# Patient Record
Sex: Male | Born: 1975 | Race: White | Hispanic: No | Marital: Married | Smoking: Former smoker
Health system: Southern US, Community
[De-identification: ages and names within clinical notes are randomized; demographics above are authoritative.]

## PROBLEM LIST (undated history)

## (undated) DIAGNOSIS — K760 Fatty (change of) liver, not elsewhere classified: Secondary | ICD-10-CM

## (undated) DIAGNOSIS — Z941 Heart transplant status: Secondary | ICD-10-CM

## (undated) DIAGNOSIS — Z8619 Personal history of other infectious and parasitic diseases: Secondary | ICD-10-CM

## (undated) DIAGNOSIS — I472 Ventricular tachycardia, unspecified: Secondary | ICD-10-CM

## (undated) DIAGNOSIS — J45909 Unspecified asthma, uncomplicated: Secondary | ICD-10-CM

## (undated) DIAGNOSIS — M8580 Other specified disorders of bone density and structure, unspecified site: Secondary | ICD-10-CM

## (undated) DIAGNOSIS — Z87442 Personal history of urinary calculi: Secondary | ICD-10-CM

## (undated) DIAGNOSIS — G4730 Sleep apnea, unspecified: Secondary | ICD-10-CM

## (undated) DIAGNOSIS — K859 Acute pancreatitis without necrosis or infection, unspecified: Secondary | ICD-10-CM

## (undated) DIAGNOSIS — Z95810 Presence of automatic (implantable) cardiac defibrillator: Secondary | ICD-10-CM

## (undated) DIAGNOSIS — F419 Anxiety disorder, unspecified: Secondary | ICD-10-CM

## (undated) HISTORY — DX: Ventricular tachycardia, unspecified: I47.20

## (undated) HISTORY — PX: HAND SURGERY: SHX662

## (undated) HISTORY — PX: TONSILLECTOMY: SUR1361

## (undated) HISTORY — DX: Presence of automatic (implantable) cardiac defibrillator: Z95.810

## (undated) HISTORY — PX: OTHER SURGICAL HISTORY: SHX169

## (undated) HISTORY — PX: WISDOM TOOTH EXTRACTION: SHX21

## (undated) HISTORY — DX: Anxiety disorder, unspecified: F41.9

## (undated) HISTORY — DX: Personal history of other infectious and parasitic diseases: Z86.19

## (undated) HISTORY — DX: Ventricular tachycardia: I47.2

---

## 1998-12-16 ENCOUNTER — Encounter: Payer: Self-pay | Admitting: Emergency Medicine

## 1998-12-16 ENCOUNTER — Emergency Department (HOSPITAL_COMMUNITY): Admission: EM | Admit: 1998-12-16 | Discharge: 1998-12-16 | Payer: Self-pay | Admitting: Emergency Medicine

## 1998-12-17 ENCOUNTER — Emergency Department (HOSPITAL_COMMUNITY): Admission: EM | Admit: 1998-12-17 | Discharge: 1998-12-17 | Payer: Self-pay | Admitting: Emergency Medicine

## 1999-05-16 ENCOUNTER — Other Ambulatory Visit: Admission: RE | Admit: 1999-05-16 | Discharge: 1999-05-16 | Payer: Self-pay | Admitting: Otolaryngology

## 1999-12-31 ENCOUNTER — Emergency Department (HOSPITAL_COMMUNITY): Admission: EM | Admit: 1999-12-31 | Discharge: 1999-12-31 | Payer: Self-pay | Admitting: Emergency Medicine

## 1999-12-31 ENCOUNTER — Encounter: Payer: Self-pay | Admitting: Emergency Medicine

## 1999-12-31 ENCOUNTER — Encounter (INDEPENDENT_AMBULATORY_CARE_PROVIDER_SITE_OTHER): Payer: Self-pay | Admitting: Specialist

## 1999-12-31 ENCOUNTER — Ambulatory Visit (HOSPITAL_BASED_OUTPATIENT_CLINIC_OR_DEPARTMENT_OTHER): Admission: RE | Admit: 1999-12-31 | Discharge: 1999-12-31 | Payer: Self-pay | Admitting: Orthopedic Surgery

## 2000-01-04 ENCOUNTER — Emergency Department (HOSPITAL_COMMUNITY): Admission: EM | Admit: 2000-01-04 | Discharge: 2000-01-04 | Payer: Self-pay | Admitting: Emergency Medicine

## 2001-09-20 ENCOUNTER — Encounter: Payer: Self-pay | Admitting: Gastroenterology

## 2001-09-22 ENCOUNTER — Ambulatory Visit (HOSPITAL_COMMUNITY): Admission: RE | Admit: 2001-09-22 | Discharge: 2001-09-22 | Payer: Self-pay | Admitting: Internal Medicine

## 2001-09-22 ENCOUNTER — Encounter: Payer: Self-pay | Admitting: Internal Medicine

## 2001-09-22 ENCOUNTER — Encounter: Payer: Self-pay | Admitting: Gastroenterology

## 2001-09-23 ENCOUNTER — Encounter: Payer: Self-pay | Admitting: Gastroenterology

## 2001-09-27 ENCOUNTER — Ambulatory Visit (HOSPITAL_COMMUNITY): Admission: RE | Admit: 2001-09-27 | Discharge: 2001-09-27 | Payer: Self-pay | Admitting: Internal Medicine

## 2001-09-27 ENCOUNTER — Encounter: Payer: Self-pay | Admitting: Internal Medicine

## 2005-08-18 DIAGNOSIS — Z95810 Presence of automatic (implantable) cardiac defibrillator: Secondary | ICD-10-CM

## 2005-08-18 HISTORY — DX: Presence of automatic (implantable) cardiac defibrillator: Z95.810

## 2006-05-04 ENCOUNTER — Ambulatory Visit: Payer: Self-pay | Admitting: Family Medicine

## 2006-05-30 ENCOUNTER — Ambulatory Visit: Payer: Self-pay | Admitting: *Deleted

## 2006-05-30 ENCOUNTER — Inpatient Hospital Stay (HOSPITAL_COMMUNITY): Admission: EM | Admit: 2006-05-30 | Discharge: 2006-06-04 | Payer: Self-pay | Admitting: Emergency Medicine

## 2006-06-01 HISTORY — PX: CARDIAC CATHETERIZATION: SHX172

## 2006-06-03 HISTORY — PX: CARDIAC DEFIBRILLATOR PLACEMENT: SHX171

## 2006-06-17 ENCOUNTER — Ambulatory Visit: Payer: Self-pay

## 2006-06-26 ENCOUNTER — Ambulatory Visit: Payer: Self-pay | Admitting: Internal Medicine

## 2006-06-30 ENCOUNTER — Encounter: Payer: Self-pay | Admitting: Gastroenterology

## 2006-07-02 ENCOUNTER — Ambulatory Visit: Payer: Self-pay | Admitting: Cardiology

## 2006-07-23 ENCOUNTER — Ambulatory Visit: Payer: Self-pay | Admitting: Internal Medicine

## 2006-09-23 ENCOUNTER — Ambulatory Visit: Payer: Self-pay

## 2006-10-27 ENCOUNTER — Ambulatory Visit: Payer: Self-pay | Admitting: Internal Medicine

## 2006-12-22 ENCOUNTER — Ambulatory Visit: Payer: Self-pay | Admitting: Internal Medicine

## 2007-01-27 ENCOUNTER — Ambulatory Visit: Payer: Self-pay | Admitting: Internal Medicine

## 2007-03-03 ENCOUNTER — Ambulatory Visit: Payer: Self-pay

## 2007-03-03 ENCOUNTER — Ambulatory Visit: Payer: Self-pay | Admitting: Internal Medicine

## 2007-05-11 ENCOUNTER — Ambulatory Visit: Payer: Self-pay | Admitting: Internal Medicine

## 2007-08-10 ENCOUNTER — Ambulatory Visit: Payer: Self-pay | Admitting: Internal Medicine

## 2007-08-17 ENCOUNTER — Ambulatory Visit: Payer: Self-pay | Admitting: Family Medicine

## 2007-08-17 DIAGNOSIS — L259 Unspecified contact dermatitis, unspecified cause: Secondary | ICD-10-CM | POA: Insufficient documentation

## 2007-10-26 ENCOUNTER — Ambulatory Visit: Payer: Self-pay | Admitting: Internal Medicine

## 2008-02-04 ENCOUNTER — Ambulatory Visit: Payer: Self-pay | Admitting: Internal Medicine

## 2008-03-06 ENCOUNTER — Ambulatory Visit: Payer: Self-pay

## 2008-03-15 ENCOUNTER — Ambulatory Visit: Payer: Self-pay | Admitting: Internal Medicine

## 2008-05-15 ENCOUNTER — Inpatient Hospital Stay (HOSPITAL_COMMUNITY): Admission: EM | Admit: 2008-05-15 | Discharge: 2008-05-22 | Payer: Self-pay | Admitting: Emergency Medicine

## 2008-05-15 ENCOUNTER — Ambulatory Visit: Payer: Self-pay | Admitting: *Deleted

## 2008-05-19 ENCOUNTER — Telehealth (INDEPENDENT_AMBULATORY_CARE_PROVIDER_SITE_OTHER): Payer: Self-pay | Admitting: Internal Medicine

## 2008-05-20 ENCOUNTER — Ambulatory Visit: Payer: Self-pay | Admitting: Infectious Diseases

## 2008-05-29 ENCOUNTER — Ambulatory Visit: Payer: Self-pay | Admitting: Internal Medicine

## 2008-05-29 LAB — CONVERTED CEMR LAB
CO2: 31 meq/L (ref 19–32)
Calcium: 9.2 mg/dL (ref 8.4–10.5)
Creatinine, Ser: 0.9 mg/dL (ref 0.4–1.5)
Glucose, Bld: 103 mg/dL — ABNORMAL HIGH (ref 70–99)

## 2008-06-27 ENCOUNTER — Ambulatory Visit: Payer: Self-pay | Admitting: Internal Medicine

## 2008-06-27 LAB — CONVERTED CEMR LAB
BUN: 8 mg/dL (ref 6–23)
Calcium: 9.2 mg/dL (ref 8.4–10.5)
GFR calc Af Amer: 113 mL/min
Glucose, Bld: 90 mg/dL (ref 70–99)
Sodium: 141 meq/L (ref 135–145)

## 2008-07-05 ENCOUNTER — Ambulatory Visit: Payer: Self-pay | Admitting: Internal Medicine

## 2008-07-05 ENCOUNTER — Ambulatory Visit: Payer: Self-pay | Admitting: Psychology

## 2008-07-18 ENCOUNTER — Ambulatory Visit: Payer: Self-pay | Admitting: Psychology

## 2008-08-03 ENCOUNTER — Ambulatory Visit: Payer: Self-pay | Admitting: Internal Medicine

## 2008-08-15 ENCOUNTER — Ambulatory Visit: Payer: Self-pay | Admitting: Psychology

## 2008-09-20 ENCOUNTER — Encounter: Payer: Self-pay | Admitting: Internal Medicine

## 2008-09-27 ENCOUNTER — Ambulatory Visit: Payer: Self-pay | Admitting: Psychology

## 2008-10-10 ENCOUNTER — Ambulatory Visit: Payer: Self-pay | Admitting: Psychology

## 2008-11-03 ENCOUNTER — Encounter: Payer: Self-pay | Admitting: Internal Medicine

## 2008-11-03 ENCOUNTER — Ambulatory Visit: Payer: Self-pay | Admitting: Internal Medicine

## 2008-11-03 DIAGNOSIS — Z8679 Personal history of other diseases of the circulatory system: Secondary | ICD-10-CM | POA: Insufficient documentation

## 2008-11-03 DIAGNOSIS — R209 Unspecified disturbances of skin sensation: Secondary | ICD-10-CM | POA: Insufficient documentation

## 2008-11-03 DIAGNOSIS — I472 Ventricular tachycardia: Secondary | ICD-10-CM

## 2008-11-03 LAB — CONVERTED CEMR LAB
Albumin: 4.2 g/dL (ref 3.5–5.2)
Total Protein: 7.3 g/dL (ref 6.0–8.3)

## 2008-12-05 ENCOUNTER — Ambulatory Visit: Payer: Self-pay | Admitting: Gastroenterology

## 2008-12-05 ENCOUNTER — Telehealth (INDEPENDENT_AMBULATORY_CARE_PROVIDER_SITE_OTHER): Payer: Self-pay | Admitting: Internal Medicine

## 2008-12-05 DIAGNOSIS — R7401 Elevation of levels of liver transaminase levels: Secondary | ICD-10-CM | POA: Insufficient documentation

## 2008-12-05 DIAGNOSIS — R740 Nonspecific elevation of levels of transaminase and lactic acid dehydrogenase [LDH]: Secondary | ICD-10-CM

## 2008-12-05 DIAGNOSIS — K219 Gastro-esophageal reflux disease without esophagitis: Secondary | ICD-10-CM | POA: Insufficient documentation

## 2008-12-06 LAB — CONVERTED CEMR LAB
Bilirubin, Direct: 0.1 mg/dL (ref 0.0–0.3)
Ferritin: 85.8 ng/mL (ref 22.0–322.0)
INR: 1 (ref 0.8–1.0)
Prothrombin Time: 11.1 s (ref 10.9–13.3)
Saturation Ratios: 20.1 % (ref 20.0–50.0)
Total Bilirubin: 0.9 mg/dL (ref 0.3–1.2)

## 2008-12-07 ENCOUNTER — Ambulatory Visit (HOSPITAL_COMMUNITY): Admission: RE | Admit: 2008-12-07 | Discharge: 2008-12-07 | Payer: Self-pay | Admitting: Gastroenterology

## 2008-12-10 LAB — CONVERTED CEMR LAB
A-1 Antitrypsin, Ser: 127 mg/dL (ref 83–200)
Angiotensin 1 Converting Enzyme: 65 units/L (ref 9–67)

## 2008-12-11 ENCOUNTER — Telehealth: Payer: Self-pay | Admitting: Internal Medicine

## 2008-12-19 ENCOUNTER — Ambulatory Visit: Payer: Self-pay | Admitting: Internal Medicine

## 2008-12-19 ENCOUNTER — Telehealth: Payer: Self-pay | Admitting: Internal Medicine

## 2008-12-19 ENCOUNTER — Ambulatory Visit: Payer: Self-pay | Admitting: Psychology

## 2009-01-04 ENCOUNTER — Ambulatory Visit: Payer: Self-pay | Admitting: Gastroenterology

## 2009-01-04 DIAGNOSIS — K760 Fatty (change of) liver, not elsewhere classified: Secondary | ICD-10-CM | POA: Insufficient documentation

## 2009-01-04 DIAGNOSIS — K8020 Calculus of gallbladder without cholecystitis without obstruction: Secondary | ICD-10-CM | POA: Insufficient documentation

## 2009-01-04 LAB — CONVERTED CEMR LAB
AST: 41 units/L — ABNORMAL HIGH (ref 0–37)
Alkaline Phosphatase: 46 units/L (ref 39–117)
Total Bilirubin: 0.6 mg/dL (ref 0.3–1.2)

## 2009-01-16 ENCOUNTER — Ambulatory Visit: Payer: Self-pay | Admitting: Psychology

## 2009-01-30 ENCOUNTER — Ambulatory Visit: Payer: Self-pay | Admitting: Psychology

## 2009-02-01 ENCOUNTER — Ambulatory Visit: Payer: Self-pay | Admitting: Internal Medicine

## 2009-02-14 ENCOUNTER — Telehealth: Payer: Self-pay | Admitting: Internal Medicine

## 2009-04-27 ENCOUNTER — Ambulatory Visit: Payer: Self-pay | Admitting: Internal Medicine

## 2009-04-27 LAB — CONVERTED CEMR LAB
Albumin: 4 g/dL (ref 3.5–5.2)
Alkaline Phosphatase: 42 units/L (ref 39–117)
Cholesterol: 215 mg/dL — ABNORMAL HIGH (ref 0–200)
HDL: 33.1 mg/dL — ABNORMAL LOW (ref >39.00)
Total CHOL/HDL Ratio: 6
VLDL: 67.2 mg/dL — ABNORMAL HIGH (ref 0.0–40.0)

## 2009-04-30 ENCOUNTER — Ambulatory Visit: Payer: Self-pay

## 2009-04-30 ENCOUNTER — Encounter: Payer: Self-pay | Admitting: Internal Medicine

## 2009-05-12 ENCOUNTER — Emergency Department: Payer: Self-pay | Admitting: Unknown Physician Specialty

## 2009-05-12 ENCOUNTER — Ambulatory Visit: Payer: Self-pay | Admitting: Cardiology

## 2009-05-14 ENCOUNTER — Telehealth: Payer: Self-pay | Admitting: Internal Medicine

## 2009-05-15 ENCOUNTER — Telehealth: Payer: Self-pay | Admitting: Internal Medicine

## 2009-05-18 ENCOUNTER — Ambulatory Visit: Payer: Self-pay | Admitting: Internal Medicine

## 2009-05-18 HISTORY — PX: OTHER SURGICAL HISTORY: SHX169

## 2009-05-22 ENCOUNTER — Ambulatory Visit: Payer: Self-pay | Admitting: Internal Medicine

## 2009-06-11 ENCOUNTER — Telehealth: Payer: Self-pay | Admitting: Internal Medicine

## 2009-06-15 ENCOUNTER — Encounter: Payer: Self-pay | Admitting: Internal Medicine

## 2009-06-15 ENCOUNTER — Ambulatory Visit: Payer: Self-pay | Admitting: Psychology

## 2009-06-15 ENCOUNTER — Telehealth: Payer: Self-pay | Admitting: Internal Medicine

## 2009-06-19 ENCOUNTER — Telehealth: Payer: Self-pay | Admitting: Internal Medicine

## 2009-06-21 ENCOUNTER — Encounter: Payer: Self-pay | Admitting: Internal Medicine

## 2009-06-27 ENCOUNTER — Ambulatory Visit: Payer: Self-pay | Admitting: Psychology

## 2009-07-03 ENCOUNTER — Ambulatory Visit: Payer: Self-pay | Admitting: Internal Medicine

## 2009-07-04 ENCOUNTER — Ambulatory Visit: Payer: Self-pay | Admitting: Psychology

## 2009-07-11 ENCOUNTER — Ambulatory Visit: Payer: Self-pay | Admitting: Psychology

## 2009-07-16 ENCOUNTER — Encounter: Payer: Self-pay | Admitting: Internal Medicine

## 2009-07-16 ENCOUNTER — Telehealth: Payer: Self-pay | Admitting: Internal Medicine

## 2009-07-19 ENCOUNTER — Ambulatory Visit: Payer: Self-pay | Admitting: Psychology

## 2009-08-02 ENCOUNTER — Ambulatory Visit: Payer: Self-pay | Admitting: Psychology

## 2009-08-08 ENCOUNTER — Telehealth: Payer: Self-pay | Admitting: Internal Medicine

## 2009-08-20 ENCOUNTER — Telehealth: Payer: Self-pay | Admitting: Internal Medicine

## 2009-08-22 ENCOUNTER — Ambulatory Visit: Payer: Self-pay | Admitting: Psychology

## 2009-09-05 ENCOUNTER — Ambulatory Visit: Payer: Self-pay | Admitting: Psychology

## 2009-10-03 ENCOUNTER — Ambulatory Visit: Payer: Self-pay

## 2009-12-18 ENCOUNTER — Ambulatory Visit: Payer: Self-pay | Admitting: Internal Medicine

## 2010-01-09 ENCOUNTER — Telehealth: Payer: Self-pay | Admitting: Internal Medicine

## 2010-02-01 ENCOUNTER — Ambulatory Visit: Payer: Self-pay | Admitting: Internal Medicine

## 2010-03-22 ENCOUNTER — Encounter: Payer: Self-pay | Admitting: Internal Medicine

## 2010-04-29 ENCOUNTER — Ambulatory Visit: Payer: Self-pay

## 2010-04-29 ENCOUNTER — Ambulatory Visit: Payer: Self-pay | Admitting: Internal Medicine

## 2010-04-29 LAB — CONVERTED CEMR LAB
BUN: 10 mg/dL (ref 6–23)
Chloride: 103 meq/L (ref 96–112)
Potassium: 4.1 meq/L (ref 3.5–5.1)
Sodium: 143 meq/L (ref 135–145)

## 2010-08-01 ENCOUNTER — Ambulatory Visit: Payer: Self-pay | Admitting: Internal Medicine

## 2010-08-28 ENCOUNTER — Encounter (INDEPENDENT_AMBULATORY_CARE_PROVIDER_SITE_OTHER): Payer: Self-pay | Admitting: *Deleted

## 2010-09-19 NOTE — Procedures (Signed)
Summary: device/saf   Current Medications (verified): 1)  Magnesium Oxide 500 Mg Tabs (Magnesium Oxide) .... Once Daily 2)  Multivitamin  Otc .... Once Daily 3)  Alprazolam 0.25 Mg Tabs (Alprazolam) .... As Needed For Sleep As Needed 4)  Sotalol Hcl 80 Mg Tabs (Sotalol Hcl) .... Take 1 Tablet By Mouth Twice A Day 5)  Singulair 10 Mg Tabs (Montelukast Sodium) .... Take 1 Tablet By Mouth Once A Day 6)  Zyrtec Hives Relief 10 Mg Tabs (Cetirizine Hcl) .... As Needed  Allergies (verified): 1)  ! * Wasp Stings   ICD Specifications Following MD:  Sherryl Manges, MD     ICD Vendor:  Medtronic     ICD Model Number:  [id number]     ICD Serial Number:  [id number] ICD DOI:  06/04/2006     ICD Implanting MD:  Sherryl Manges, MD  Lead 1:    Location: RV     DOI: 06/04/2006     Model #: [id number]     Serial #: [id number]     Status: active  Indications::  ARVD   ICD Follow Up Battery Voltage:  3.02 V     Charge Time:  9.9 seconds     Underlying rhythm:  SR ICD Dependent:  No       ICD Device Measurements Right Ventricle:  Amplitude: 19.4 mV, Impedance: 640 ohms, Threshold: 1.0 V at 0.4 msec Shock Impedance: 46/62 ohms   Episodes MS Episodes:  0     Coumadin:  No Shock:  0     ATP:  0     Nonsustained:  0     Atrial Therapies:  0 Ventricular Pacing:  0.7%  Brady Parameters Mode VVI     Lower Rate Limit:  40      Tachy Zones VF:  200     VT:  250 FVT VIA VF     VT1:  150     Next Remote Date:  08/01/2010     Next Cardiology Appt Due:  01/17/2011 Tech Comments:  NORMAL DEVICE FUNCTION.  NO EPISODES SINCE LAST CHECK. OPTIVOL STABLE. PT WAS GIVEN FILTER TO SEE IF THAT WOULD HELP W/CARELINK. CARELINK TRANSMISSION 08-01-10. ROV IN [place] W/SK. Vella Kohler  April 29, 2010 10:56 AM

## 2010-09-19 NOTE — Cardiovascular Report (Signed)
Summary: Office Visit   Office Visit   Imported By: Roderic Ovens 12/21/2009 14:31:07  _____________________________________________________________________  External Attachment:    Type:   Image     Comment:   External Document

## 2010-09-19 NOTE — Cardiovascular Report (Signed)
Summary: Office Visit Remote   Office Visit Remote   Imported By: Roderic Ovens 08/30/2010 11:29:17  _____________________________________________________________________  External Attachment:    Type:   Image     Comment:   External Document

## 2010-09-19 NOTE — Cardiovascular Report (Signed)
Summary: Office Visit   Office Visit   Imported By: Roderic Ovens 05/13/2010 16:20:47  _____________________________________________________________________  External Attachment:    Type:   Image     Comment:   External Document

## 2010-09-19 NOTE — Procedures (Signed)
Summary: device chek medtronic   Current Medications (verified): 1)  Magnesium Oxide 400 Mg Tabs (Magnesium Oxide) .... Once Daily 2)  Multivitamin  Otc .... Once Daily 3)  Alprazolam 0.25 Mg Tabs (Alprazolam) .... As Needed For Sleep As Needed 4)  Sotalol Hcl 80 Mg Tabs (Sotalol Hcl) .... Take 1 Tablet By Mouth Twice A Day 5)  Singulair 10 Mg Tabs (Montelukast Sodium) .... Take 1 Tablet By Mouth Once A Day  Allergies (verified): 1)  ! * Wasp Stings    ICD Specifications Following MD:  Sherryl Manges, MD     ICD Vendor:  Medtronic     ICD Model Number:  D154VWC     ICD Serial Number:  [id number] ICD DOI:  06/04/2006     ICD Implanting MD:  Sherryl Manges, MD  Lead 1:    Location: RV     DOI: 06/04/2006     Model #: [id number]     Serial #: [id number]     Status: active  Indications::  ARVD   ICD Follow Up Remote Check?  No Battery Voltage:  3.03 V     Charge Time:  9.7 seconds     Underlying rhythm:  Huston Foley ICD Dependent:  No       ICD Device Measurements Right Ventricle:  Amplitude: 19.4 mV, Impedance: 672 ohms, Threshold: 1.0 V at 0.4 msec Shock Impedance: 46/58 ohms   Episodes Coumadin:  No  Brady Parameters Mode VVI     Lower Rate Limit:  40      Tachy Zones VF:  240     VT:  OFF     VT1:  200     Next Cardiology Appt Due:  12/16/2009 Tech Comments:  No parameter changes.  No episodes since last visit 11/10.  There was a question about if he was to be takiing ASA 81mg .  He was under the impression he was to titrate it from 325mg  for 2 weeks then 81mg . for 2 weeks, then stop.  I will discuss this with Dr. Graciela Husbands and call the patient back.  He will return in 3 months to clinic.   Altha Harm, LPN  October 03, 2009 11:14 AM

## 2010-09-19 NOTE — Assessment & Plan Note (Signed)
Summary: defib check.mdt.amber   Primary Provider:  Everrett Coombe PA  CC:  defib check.  .  History of Present Illness: [name] is seen in followup for ventricular tachycardia in the setting of a RVD. He has had recurrent palpitations. His amiodarone dose has been recently uptitrated.  he returns now from [place] having undergone catheter ablation of ventricular tachycardia by Dr. Sallye Ober at [facility]  which was felt to be successful and primarily an endovascular procedure.   His amiodarone was discontinued;   he remains on sotalol.  He continues to struggle with photosensitivity.  He also has palpitations and anxiety related to exertion as well as shortness of breath.    he continues to have issues related to anxiety of recurrent shocks. He is now taking a Xanax p.r.n.   Allergies: 1)  ! * Wasp Stings  Past History:  Past Medical History: Last updated: 01/04/2009 Atrial fibrillation - which is permanent.  Complete heart block.  Ischemic cardiomyopathy with congestive heart failure.  Status post cardiac resynchronization therapy pacemaker for the       above. -- Medtronic Normal left ventricular function.  Anxiety Disorder Sleep Apnea Mononucleosis with acute hepatitis, 2003 Fatty liver with HM Cholelithiasis  Past Surgical History: Last updated: Sep 04, 2007 Abd U/S, wnl, neg ASM 10/07/01 Cardiac cath - Cooper 06/01/06 Defibrillator implant - Graciela Husbands 06/03/06 Electro physiology eval - Ladona Ridgel  Family History: Last updated: September 04, 2007 Father: Deceased, accident Mother: Alive, asthma Siblings: One brother; okay,                 one sister; okay  DM- MI- CVA-  Prostate Cancer- Breast Cancer- Ovarian Cancer- Uterine Cancer- Colon Cancer- Drug/ ETOH Abuse- Depression-   Social History: Last updated: 12/05/2008 Marital Status: Single Children: None Occupation: Teacher, adult education Full Time Tobacco Use - Yes.  Alcohol Use - yes drinks 2-4 alcohol  beverages per week, several years ago was 12-20 per week Drug Use - no  Vital Signs:  Patient profile:   35 year old male Height:      70 inches Weight:      195 pounds BMI:     28.08 Pulse rate:   73 / minute Pulse rhythm:   regular BP sitting:   114 / 78  (left arm) Cuff size:   large  Vitals Entered By: Judithe Modest CMA (Dec 18, 2009 11:03 AM)  Physical Exam  General:  The patient was alert and oriented in no acute distress. HEENT Normal.  Neck veins were flat, carotids were brisk.  Lungs were clear.  Heart sounds were regular without murmurs or gallops.  Abdomen was soft with active bowel sounds. There is no clubbing cyanosis or edema. Skin Warm and dry     ICD Specifications Following MD:  Sherryl Manges, MD     ICD Vendor:  Medtronic     ICD Model Number:  [id number]     ICD Serial Number:  [id number] ICD DOI:  06/04/2006     ICD Implanting MD:  Sherryl Manges, MD  Lead 1:    Location: RV     DOI: 06/04/2006     Model #: [id number]     Serial #: [id number]     Status: active  Indications::  ARVD   ICD Follow Up Remote Check?  No Battery Voltage:  3.06 V     Charge Time:  9.7 seconds     Underlying rhythm:  SR ICD Dependent:  No  ICD Device Measurements Right Ventricle:  Amplitude: 19.4 mV, Impedance: 680 ohms, Threshold: 1.0 V at 0.4 msec Shock Impedance: 48/66 ohms   Episodes MS Episodes:  0     Percent Mode Switch:  0     Coumadin:  No Shock:  0     ATP:  0     Nonsustained:  0     Atrial Therapies:  0 Ventricular Pacing:  0.3%  Brady Parameters Mode VVI     Lower Rate Limit:  40      Tachy Zones VF:  240     VT:  OFF     VT1:  200     Next Remote Date:  03/21/2010     Tech Comments:  NORMAL DEVICE FUNCTION.  NO CHANGES MADE.  CARELINK CHECK 03-21-10. 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  His updated medication list for  this problem includes:    Sotalol Hcl 80 Mg Tabs (Sotalol hcl) .Marland Kitchen... Take 1 tablet by mouth twice a day  Problem # 2:  PALPITATIONS, RECURRENT (ICD-785.1) patient had recurrent palpitations. We have reprogrammed his device to create a monitoring zone between 150 and 200.; if this is inadequate we will need to give him an event recorder.  His updated medication list for this problem includes:    Sotalol Hcl 80 Mg Tabs (Sotalol hcl) .Marland Kitchen... 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 With his dyspnea,, we will consider  ultrasound if his symptoms do not abate His updated medication list for this problem includes:    Sotalol Hcl 80 Mg Tabs (Sotalol hcl) .Marland Kitchen... Take 1 tablet by mouth twice a day  Problem # 4:  IMPLANTABLE CARDIAC DEFIBRILLATOR  MDT (ICD-V45.02) Device parameters and data were reviewed ;  reprogramming was done as above  Patient Instructions: 1)  Your physician recommends that you schedule a follow-up appointment in: 4 weeks with Dr Graciela Husbands

## 2010-09-19 NOTE — Progress Notes (Signed)
Summary: appt  Phone Note Call from Patient Call back at Home Phone [phone]   Caller: Patient Reason for Call: Talk to Nurse Summary of Call: pt is sch for pacer check w/ Dr Graciela Husbands on 5/31, wants to know if Dr Graciela Husbands can work him in on another day Initial call taken by: Migdalia Dk,  Jan 09, 2010 3:00 PM  Follow-up for Phone Call        Spoke with pt. Patient has an appointment in the pacer clinic 5/31/ 11. He would like to change the appointment  to another day. I explaned to pt. Dr. Bernardo Heater pacer clinic are one once a month. Pt. was offered June 28th or July 26th. Pt. states he needs to be seen sunner then June 28th. Pt. states will leave appointment for 01/15/10. Follow-up by: Ollen Gross, RN, BSN,  Jan 09, 2010 3:50 PM

## 2010-09-19 NOTE — Cardiovascular Report (Signed)
Summary: Office Visit   Office Visit   Imported By: Roderic Ovens 02/04/2010 15:23:44  _____________________________________________________________________  External Attachment:    Type:   Image     Comment:   External Document

## 2010-09-19 NOTE — Assessment & Plan Note (Signed)
Summary: defib check.mdt.amber   Visit Type:  Follow-up Primary Provider:  Everrett Coombe PA   History of Present Illness: Mr. Eisenhardt is seen in followup for ventricular tachycardia in the setting of ARVD. He has had recurrent palpitations. He is s/p ablation of ventricular tachycardia by Dr. Sallye Ober at [facility]  which was felt to be successful and primarily an endovascular procedure.   His amiodarone was discontinued;   he remains on sotalol.  He continues to struggle with photosensitivity.  He also has palpitations and anxiety related to exertion as well as shortness of breath.    he continues to have issues related to anxiety of recurrent shocks. He is now taking a Xanax p.r.n.   Current Medications (verified): 1)  Magnesium Oxide 500 Mg Tabs (Magnesium Oxide) .... Once Daily 2)  Multivitamin  Otc .... Once Daily 3)  Alprazolam 0.25 Mg Tabs (Alprazolam) .... As Needed For Sleep As Needed 4)  Sotalol Hcl 80 Mg Tabs (Sotalol Hcl) .... Take 1 Tablet By Mouth Twice A Day 5)  Singulair 10 Mg Tabs (Montelukast Sodium) .... Take 1 Tablet By Mouth Once A Day 6)  Zyrtec Hives Relief 10 Mg Tabs (Cetirizine Hcl) .... As Needed  Allergies (verified): 1)  ! * Wasp Stings  Past History:  Past Medical History: Last updated: 01/04/2009 Atrial fibrillation - which is permanent.  Complete heart block.  Ischemic cardiomyopathy with congestive heart failure.  Status post cardiac resynchronization therapy pacemaker for the       above. -- Medtronic Normal left ventricular function.  Anxiety Disorder Sleep Apnea Mononucleosis with acute hepatitis, 2003 Fatty liver with HM Cholelithiasis  Vital Signs:  Patient profile:   35 year old male Height:      70 inches Weight:      197 pounds BMI:     28.37 Pulse rate:   71 / minute BP sitting:   120 / 84  (left arm)  Vitals Entered By: Laurance Flatten CMA (February 01, 2010 3:20 PM)  Physical Exam  General:  The patient was alert and oriented  in no acute distress. HEENT Normal.  Neck veins were flat, carotids were brisk.  Lungs were clear.  Heart sounds were regular without murmurs or gallops.  Abdomen was soft with active bowel sounds. There is no clubbing cyanosis or edema. Skin Warm and dry     ICD Specifications Following MD:  Sherryl Manges, MD     ICD Vendor:  Medtronic     ICD Model Number:  [id number]     ICD Serial Number:  [id number] ICD DOI:  06/04/2006     ICD Implanting MD:  Sherryl Manges, MD  Lead 1:    Location: RV     DOI: 06/04/2006     Model #: [id number]     Serial #: [id number]     Status: active  Indications::  ARVD   ICD Follow Up Remote Check?  No Battery Voltage:  3.06 V     Charge Time:  9.7 seconds     Underlying rhythm:  SR ICD Dependent:  No       ICD Device Measurements Right Ventricle:  Amplitude: 19.4 mV, Impedance: 648 ohms, Threshold: 0.5 V at 0.4 msec Shock Impedance: 46/59 ohms   Episodes Coumadin:  No Shock:  0     ATP:  0     Nonsustained:  0      Brady Parameters Mode VVI     Lower Rate Limit:  40  Tachy Zones VF:  200     VT:  250 FVT VIA VF     VT1:  150     Next Cardiology Appt Due:  04/18/2010 Tech Comments:  No parameter changes.  1 SVT episode 5 seconds.  Noise noted during episode on lead(Can->RVcoil used for wavelet), which I was able to reproduce.  There was no oversensing of this noise.Optivol and thoracic impedance normal.  Single PVC's 53.2/hr.    ROV 3 months clinic. 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 Take 1 tablet by mouth twice a day  His updated medication list for this problem includes:    Sotalol Hcl 80 Mg Tabs (Sotalol hcl) .Marland Kitchen... 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  will continue the soltalol for nmow His updated medication list for this problem includes:    Sotalol Hcl 80 Mg Tabs (Sotalol hcl) .Marland Kitchen... Take 1 tablet by mouth twice a day  Patient Instructions: 1)  Your physician recommends that you schedule a follow-up appointment in: 3 MONTHS WITH DEVICE CLINIC.

## 2010-09-19 NOTE — Letter (Signed)
Summary: Device-Delinquent Phone Journalist, newspaper, Main Office  [address]   [place], [place] [address]   Phone: [phone]  Fax: [phone]     March 22, 2010 MRN: [id number]   WILLOUGHBY DOELL [address] [place], [place]  [address]   Dear Mr. CADDEN,  According to our records, you were scheduled for a device phone transmission on 03-21-2010.     We did not receive any results from this check.  If you transmitted on your scheduled day, please call us to help troubleshoot your system.  If you forgot to send your transmission, please send one upon receipt of this letter.  Thank you,   Architectural technologist [facility]

## 2010-09-19 NOTE — Letter (Signed)
Summary: Remote Device Check  [employer], Main Office  [address]   [place], [place] [address]   Phone: [phone]  Fax: [phone]     August 28, 2010 MRN: [id number]   [name] [address] [place], [place]  [address]   Dear Mr. HART,   Your remote transmission was recieved and reviewed by your physician.  All diagnostics were within normal limits for you.  __X___Your next transmission is scheduled for:  10-31-2010.  Please transmit at any time this day.  If you have a wireless device your transmission will be sent automatically.   Sincerely,  Vella Kohler

## 2010-09-19 NOTE — Progress Notes (Signed)
Summary: REFILL  Phone Note Refill Request Message from:  Patient on August 20, 2009 9:18 AM  Refills Requested: Medication #1:  SOTALOL HCL 80 MG TABS Take 1 tablet by mouth twice a day SENT TO [facility] [address] [phone] PT IS OUT OF MEDICATION PT NEEDS IT TODAY.  Initial call taken by: Judie Grieve,  August 20, 2009 9:19 AM    Prescriptions: SOTALOL HCL 80 MG TABS (SOTALOL HCL) Take 1 tablet by mouth twice a day  #60 x 11   Entered by:   Judithe Modest CMA   Authorized by:   Nathen May, MD, [facility]   Signed by:   Judithe Modest CMA on 08/20/2009   Method used:   [facility]ically to        [facility]  [address]       [address]       [place], [place]  [address]       Ph: [phone]       Fax: [phone]   RxID:   [id number]

## 2010-10-31 ENCOUNTER — Encounter (INDEPENDENT_AMBULATORY_CARE_PROVIDER_SITE_OTHER): Payer: PRIVATE HEALTH INSURANCE

## 2010-10-31 DIAGNOSIS — I509 Heart failure, unspecified: Secondary | ICD-10-CM

## 2010-10-31 DIAGNOSIS — I428 Other cardiomyopathies: Secondary | ICD-10-CM

## 2010-11-17 ENCOUNTER — Encounter: Payer: Self-pay | Admitting: *Deleted

## 2010-12-12 ENCOUNTER — Encounter: Payer: Self-pay | Admitting: Internal Medicine

## 2010-12-31 NOTE — Assessment & Plan Note (Signed)
[facility]                         ELECTROPHYSIOLOGY OFFICE NOTE   NAME:Short, [name] BRUMBACH                        MRN:          [id number]  DATE:05/11/2007                            DOB:          09-Feb-1976    [name] comes in following an ICD implantation for ARVD.  He has had 2  episodes of non-sustained VT.  He has tolerated his Inderal 80 mg twice  daily quite well.   On examination, his blood pressure 100/78, pulse 65.  LUNGS:  Clear.  HEART:  Sounds regular.   Interrogation of his Medtronic Virtuoso ICD demonstrates an R-wave of  19.4, impedance of 632, threshold of 0.5 at 0.4.  Impedances were 47/61,  battery voltage 3.19.   IMPRESSION:  1. ARVD.  2. Recurrent ventricular tachycardia.  3. Inderal for the above.   [name] is stable. We will see him again in 6 months.  He will have  CareLink interrogation in 3.     [name], MD, [facility]  Electronically Signed    SCK/MedQ  DD: 05/11/2007  DT: 05/11/2007  Job #: [id number]

## 2010-12-31 NOTE — Assessment & Plan Note (Signed)
[facility]                         ELECTROPHYSIOLOGY OFFICE NOTE   NAME:Quizon, BARTLETT ENKE                        MRN:          [id number]  DATE:10/24/2008                            DOB:          05-May-1976    Mr. Paolillo is seen in followup for atrial fibrillation which is permanent,  high-grade heart block, congestive heart failure in the setting of  ischemic heart disease status post CRT pacing.  He continues to do well  without shortness of breath, able to do pretty much what he wants to do  at this point as long he does not push himself too hard.   He denies chest pain, peripheral edema.   His medications include simvastatin, Coumadin, furosemide, potassium,  aspirin, atenolol, glyburide, omeprazole, isosorbide, metformin.   On examination, his blood pressure today was 109/63, the pulse was 70,  his weight was 214 which is about stable.  His lungs were clear.  His  neck veins were flat.  His heart sounds were regular with a 2/6 murmur.  There is trace peripheral edema.   Interrogation of his [employer] Frontier pulse generator demonstrates an R-  wave of 9 with impedance of 555, a threshold of 0.75 at 0.5.  The LV  impedance of 1211 with a threshold of 1.5 at 0.8.  Battery voltage 2.78.   IMPRESSION:  1. Atrial fibrillation - which is permanent.  2. Complete heart block.  3. Ischemic cardiomyopathy with congestive heart failure.  4. Status post cardiac resynchronization therapy pacemaker for the      above.  5. Normal left ventricular function.   Mr. Skog is doing quite well with his antibradycardia pacing in the  setting of his permanent atrial fibrillation.  We will see him again in  6 months' time.  He will going on to Dr. Riley Kill in the interim.     [name], MD, [facility]  Electronically Signed    SCK/MedQ  DD: 10/24/2008  DT: 10/25/2008  Job #: [id number]   cc:   Ellin Saba., MD

## 2010-12-31 NOTE — Assessment & Plan Note (Signed)
[facility]                         ELECTROPHYSIOLOGY OFFICE NOTE   NAME:[name], [name]                        MRN:          [id number]  DATE:03/03/2007                            DOB:          1975-09-09    [name] comes in having been shocked by his defibrillator while taking  out the garbage.   He has inter-currently been a little bit sickly with a bronchitis-like  illness.  He has also been working many many hours.   His current beta blocker regime has been Inderal short-acting 40 mg  twice daily, having previously not tolerated atenolol, Nadolol, sotalol  or metoprolol succinate.   Interrogation of his device is troubling.  The far-field electrogram is  clearly very broad.  A near-field electrogram is very difficult to  distinguish from sinus.  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  I am concerned about our inability to suppress  it and our inability to control with beta blockers or anti tachycardia  pacing.   For right now, my thought is to increase his beta blocker and will plan  to increase his Inderal from 40 to 60 twice daily, if he tolerates that,  to 80.   In the event that he continues to episodes, it will become appropriate  to undertake EP testing, in the context of arrhythmogenic RV dysplasia.  Substrate mapping will be key.  This would need to be done with either  ESI or CARTO.   Will plan to see him again in September, as previously scheduled.     [name], MD, [facility]  Electronically Signed    SCK/MedQ  DD: 03/03/2007  DT: 03/04/2007  Job #: [id number]

## 2010-12-31 NOTE — H&P (Signed)
NAMEBRYSE, [name]:  [id number]   MEDICAL RECORD NO.:  [id number]          PATIENT TYPE:  INP   LOCATION:  2911                         FACILITY:  [facility]   PHYSICIAN:  Unice Cobble, MD     DATE OF BIRTH:  1975/10/23   DATE OF ADMISSION:  05/15/2008  DATE OF DISCHARGE:                              HISTORY & PHYSICAL   CARDIOLOGIST:  The patient's cardiologist is [name], M.D.,  [facility].   CHIEF COMPLAINT:  ICD discharge.   HISTORY OF THE PRESENT ILLNESS:  This is a 35 year old white male with  the diagnosis of arrhythmogenic right ventricular dysplasia (ARVD) who  presents with six episodes of ICD firings.  The patient is sick and has  not eaten well over the last few days.  He complains of fevers, chills,  sweats and congestion in his upper chest and head.  He had only crackers  yesterday and forgot to take his Inderal last night.  Today his ICD  fired six times.  By interrogation there are numerous sustained and  nonsustained episodes of ventricular tachycardia with numerous episodes  of antitachycardia pacing as well.  He is otherwise asymptomatic and  does not complain of chest pain, shortness of breath or symptoms of  congestive heart failure.  The ventricular tachycardia does not occur  during exertion as it has done in the past.  Upon admission his  potassium is 2.9 and his ionized calcium was 1.03.   PAST MEDICAL HISTORY:  1. Arrhythmogenic ventricular dysplasia on Inderal; he has failed      atenolol and sotalol.  2. Normal catheterization in October 2007.   ALLERGIES:  No known drug allergies.   MEDICATIONS:  Inderal 80 mg at bedtime.   SOCIAL HISTORY:  The patient lives in [place] with a roommate (a  cousin).  He is a paramedic.  He smokes one pack/day.  Drinks occasional  alcohol.  No drugs.   FAMILY HISTORY:  The patient's sister has been diagnosed with  arrhythmogenic ventricular dysplasia.  It is presumed that his  father  had arrhythmogenic ventricular dysplasia.   REVIEW OF SYSTEMS:  A complete review of systems is done and found to be  otherwise negative, except that noted in the HPI.   PHYSICAL EXAMINATION:  VITAL SIGNS:  On physical exam temperature is  afebrile, pulse is 85, respiratory rate is 23, blood pressure is 115/65,  and O2 sat is  100% on 2 liters.  GENERAL APPEARANCE:  In general he is pale, anxious and thin.  HEENT:  The head, eyes, ears, nose, and throat show normocephalic and  atraumatic head.  PERRLA.  EOMI. Moist mucous membranes.  Oropharynx is  without erythema or exudates.  NECK:  The neck is supple without lymphadenopathy, thyromegaly, bruits  or jugular venous distention.  HEART:  The heart has a regular rate and rhythm.  Normal S1 and S2.  No  murmurs, gallops or rubs.  Normal PMI.  Pulses are 2+ and equal  bilaterally without bruits.  LUNGS:  The lungs are clear to auscultation bilaterally without  wheezes,  rhonchi or rales.  SKIN:  The skin shows no rashes or lesions.  ABDOMEN:  The abdomen is soft and nontender with normal bowel sounds.  No rebound or guarding.  EXTREMITIES:  The extremities are warm and well-perfused without  cyanosis, clubbing or edema.  MUSCULOSKELETAL:  The musculoskeletal exam shows no joint deformity or  effusions.  No spinal or CVA tenderness.  NEUROLOGIC EXAMINATION:  Neurologically he is alert and oriented x3 with  cranial nerves II-XII grossly intact.  Strength is 5/5 in all  extremities and axial groups.  Normal sensation throughout.   RADIOLOGY AND LABORATORY REVIEW:  The chest x-ray shows basilar  subsegmental atelectasis.  The EKG shows normal sinus rhythm at a rate  of 87.  He has Qs in V1 and V2, and PVCs.  A telemetry strip shows  numerous episodes of nonsustained ventricular tachycardia.  An  interrogation of the pacemaker revealed 102 episodes of ventricular  tachycardia with 97 paced terminated episodes and 6 shock  terminated  episodes.  Labs show a normal white blood cell count and hemoglobin.  The potassium is 2.9 with a creatinine of 1.5.  His ionized calcium is  1.03.  Troponin is less than 0.05.   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  We will hydrate him  for renal insufficiency, and replete potassium, calcium and presumed  magnesium deficiency.  Amiodarone will be used for short-term  ventricular tachycardia control.  If this does not resolve the VT, I  will consider a lidocaine drip.  The patient has taken Inderal tonight  and this will be continued.  Thyroid stimulating hormone will be  checked.  Should he need long-term antiarrhythmic therapy above and  beyond Inderal then dofetilide may be the next choice.      Unice Cobble, MD  Electronically Signed     ACJ/MEDQ  D:  05/15/2008  T:  05/16/2008  Job:  [id number]

## 2010-12-31 NOTE — Discharge Summary (Signed)
[name], [name]:  [id number]   MEDICAL RECORD NO.:  [id number]          PATIENT TYPE:  INP   LOCATION:  2012                         FACILITY:  [facility]   PHYSICIAN:  [name], MD, FACCDATE OF BIRTH:  Jul 11, 1976   DATE OF ADMISSION:  05/15/2008  DATE OF DISCHARGE:  05/22/2008                               DISCHARGE SUMMARY   This patient has no known drug allergies.   Time of this dictation is greater than 35 minutes.   The patient does not have drug allergies; however, in the past for his  arrhythmogenic right ventricular dysplasia, he has stopped sotalol  secondary to memory loss, mood swings, and energy fluctuations and  stopped atenolol because it did not prevent recurrent VT, maintained on  Inderal since May 2008 until this admission; however, the patient on the  current medication of amiodarone was bradycardic and Inderal has been  stopped.   FINAL DIAGNOSES:  1. Admitted with 36 hours of FEVER/diaphoresis/myalgias/presenting      with hypokalemia, potassium of 2.9.      a.     Implantable cardioverter-defibrillator discharges for       sustained ventricular tachycardia/ventricular fibrillation (x6).      b.     Documented H1N1 by PCR.      c.     Blood cultures negative x2.      d.     Clostridium difficile assay negative.  2. Started amiodarone on this admission and will continue for 4-6      weeks while virus clears and the patient improves.      a.     IV drip of amiodarone changing to oral.  The patient       discharged on 400 mg b.i.d.  3. The patient maintained on this hospitalization on strict isolation      precautions and strict droplet precautions.  The patient in      addition to Tamiflu was maintained on azithromycin, Rocephin, and      vancomycin.   SECONDARY DIAGNOSES:  1. History of arrhythmogenic right ventricular dysplasia.      a.     Cardioverter defibrillator implanted for       presyncope/inducible ventricular  tachycardia, October 2007.      b.     Recent study on arrhythmogenic right ventricular dysplasia,       beta blockers were not that effective for preventing ventricular       tachycardia.  2. Hypokalemia on this admission.  Aldosterone level was pending at      this discharge.   BRIEF HISTORY:  Mr. Almond is a 35 year old male.  He presented to [facility] after his cardioverter discharged 6 times, and prior to  admitting on May 15, 2008, the patient had been feeling poorly for  about 36 hours.  He had fever, most pronounced.  He had a profuse  diaphoresis the whole evening of the day before his admission and had  universal myalgias.  The patient was not eating well and had not taken  his Inderal.  He presented because his ICD fired 6 times.  On  interrogation, it showed that the patient had 102 VT episodes and 1 VF  episode, total of 6 shocks.  These all occurred during the 36-hour  period prior to his admission.  The patient will be admitted and finding  of potassium 2.9, will have a potassium replenishment.  He will be  started on IV lidocaine for VT control with consideration of lidocaine  if IV amiodarone proves ineffective.   HOSPITAL COURSE:  The patient presents to [facility] with a  febrile illness on May 15, 2008.  He also had accompanying ICD  shocks.  The patient was started on IV amiodarone.  He was started on IV  antibiotics triple therapy.  In addition, PCR study was positive for  H1N1.  Blood cultures were negative x2.  The patient's hypokalemia took  several days to replenish.  The patient was actually started on IV  therapy of both potassium and magnesium.  At the time of discharge, the  patient's potassium was 3.9 and his last supplementation was actually 72  hours prior to that.  The patient will continue on magnesium 400 mg  twice daily.  Dr. Graciela Husbands has followed this patient throughout this  admission and talked to Dr. Sharlene Dory at [facility].  Dr. Sharlene Dory  mentioned that a febrile illness and VT storm in the setting of ARVD,  would be common and once the febrile illness had passed, the patient  should expect quiescence from his VT.  In addition, it was mentioned  that continuing amiodarone for 4-6 week period to see the patient  finally through recovery would be advisable.  Dr. Sharlene Dory also mentioned  that endocardial radiofrequency catheter ablation should proceed in the  attempt at epicardial radiofrequency catheter ablation.  The patient's  antibiotics were discounted 48 hours prior to his discharge and the  patient was ready for discharge on May 21, 2008.  As of May 20, 2008, 48 hours prior to this discharge, the patient states that he has  been feeling his normal self.  He has been afebrile through most of this  hospitalization.  His potassium as mentioned above has normalized.  He  was seen by Dr. Graciela Husbands prior to discharge who mentioned that he should  follow up with him in 1 week with a basic metabolic panel.   The patient's medications at discharge are as follows:  1. Amiodarone 200 mg tablets 2 in the morning and 2 in the evening.  2. Enteric-coated aspirin 325 mg daily.  3. Magnesium oxide 400 mg twice daily.   He is to stop taking Inderal.  He sees Dr. Graciela Husbands on Monday, May 29, 2008, at 2:30.  Potassium level will be checked at that time.   Lab studies on May 18, 2008, hemoglobin 11.6, hematocrit 33.1, white  cells are 5, and platelets are 126.  Serum electrolytes on May 22, 2008, the day of discharge, sodium 141, potassium 3.9, chloride 104,  carbonate 28, BUN is 5, creatinine 0.79, and glucose is 95.  His liver  panel was of interest since the patient is going to be on amiodarone.  Alkaline phosphatase 42, SGOT 36, and SGPT is 51.  TSH on this admission  was 0.472.  Aldosterone to renin ratio is still pending at the time of  this discharge.      [name], [name]       [name], MD, [facility]  Electronically  Signed    GM/MEDQ  D:  05/22/2008  T:  05/23/2008  Job:  [id number]   cc:   Willaim Sheng D. Bean, FNP

## 2010-12-31 NOTE — Assessment & Plan Note (Signed)
[facility]                         ELECTROPHYSIOLOGY OFFICE NOTE   NAME:[name], [name]                        MRN:          [id number]  DATE:05/29/2008                            DOB:          [birth year]    [name] is seen following a recent hospitalization for  VT storm in the  setting of ARVD and H1N1.  He is doing better.  He ended up on  amiodarone.  He comes in today feeling pretty good.  The date on his  electrocardiogram is wrong, today is October 12, and his heart rate is  regular and it is 70.   OTHER MEDICATIONS:  Magnesium oxide and his amiodarone dose is 400  b.i.d. with intention that he down titrate it today to 400 a day.   PHYSICAL EXAMINATION:  His lungs were clear.  Heart sounds were regular.  Blood pressure was 132/72 with a pulse of 78.   Interrogation of his ICD demonstrated no intercurrent episodes.   IMPRESSION:  1. Arrhythmogenic right ventricular dysplasia.  2. Ventricular tachycardia storm.  3. Recent H1N1.  4. Hypokalemia associated with the above.  5. Amiodarone for the above.   We will plan to decease his amiodarone today to 400 a day and I will see  him again in four weeks' time.     [name], MD, [facility]  Electronically Signed    SCK/MedQ  DD: 05/29/2008  DT: 05/30/2008  Job #: [id number]

## 2010-12-31 NOTE — Assessment & Plan Note (Signed)
[name]                         ELECTROPHYSIOLOGY OFFICE NOTE   NAME:[name], [name]                        MRN:          [id number]  DATE:03/15/2008                            DOB:          07-Jun-1976    [name] is seen in followup of arrhythmogenic right ventricular  dysplasia with an intercurrent shock.  He was again having sex.  Electrogram evaluation is consistent with ventricular tachycardia cycle  length of 242-250 milliseconds.  ATP was not delivered.   He is on Bystolic.  He thinks that Inderal was the best drug in terms of  preventing ventricular tachycardia.  He has already failed atenolol and  sotalol.   On examination today, his blood pressure was 116/70 with pulse of 49.  Lungs were clear.  Heart sounds were regular.  Abdomen was soft.  Extremities were without edema.   Interrogation of device is as noted above.   The device was reprogrammed to try to offer ATP during charge into a  faster VT.  I also increased the detections in the VT lowering from 176  to 190 to try to minimize the likelihood of inappropriate therapy.   We will see him again in 2 months' time and we have also prescribed him  Inderal at 80 mg to take at night.     [name], MD, [facility]  Electronically Signed    SCK/MedQ  DD: 03/15/2008  DT: 03/16/2008  Job #: [id number]

## 2010-12-31 NOTE — Assessment & Plan Note (Signed)
[facility]                         ELECTROPHYSIOLOGY OFFICE NOTE   NAME:Short Short SAY                        MRN:          [id number]  DATE:10/26/2007                            DOB:          08-09-1976    Short Short comes in with a history of ARVD, recurrent tachycardia and ICD  therapy.  He has been taking beta-blockers.  He missed one of his doses  of Inderal, and he felt much better.  He has previously taken and not  tolerated atenolol, nadolol and metoprolol.   He also describes on a couple of cold days earlier this month where he  noted that 2 of his fingers got numb, and at the same time they were  quite gray.  This happens concomitant in his foot.  He did not look at  his foot to see whether it had changed colors.   He also notes that he has been sleeping poorly.   MEDICATIONS:  His only medications are the Inderal b.i.d.   PHYSICAL EXAMINATION:  VITAL SIGNS:  Blood pressure is 117/76 with a  pulse of 60.  LUNGS:  Clear.  HEART:  His heart sounds were regular.  EXTREMITIES:  Without edema.  SKIN:  Warm and dry.   Interrogation of his Medtronic ICD demonstrates an R-wave of 19.4 with  impedance of 712, threshold of 1 volt at 0.4.  Battery voltage was 3.16.  There were 2 intercurrent episodes of the nonsustained tachycardia.   IMPRESSION:  1. Recurrent tachycardia with appropriate ICD therapy.  2. Arrhythmogenic RV dysplasia.  3. Status post ICD for the above.  4. Intolerance of the aforementioned beta-blockers.  5. Discoloration transiently of his hands associated with numbness.   I suspect that the latter event was a vascular spasm, given its  recurrence and its association with cold, at this point I would not  pursue it further.   As it relates to his beta blocker effects, I have given him  prescriptions for bisoprolol 2.5, Betaxolol 2.5 and Bystolic 2.5 to see  if he will tolerate any of these better than otherwise.  In the that  he  cannot tolerate a beta-blocker, will probably try and put him on a  calcium blocker and take that as the next best choice.    [name], MD, [facility]  Electronically Signed   SCK/MedQ  DD: 10/26/2007  DT: 10/27/2007  Job #: [id number]

## 2010-12-31 NOTE — Assessment & Plan Note (Signed)
[facility]                         ELECTROPHYSIOLOGY OFFICE NOTE   NAME:Malay, HANCEL ION                        MRN:          [id number]  DATE:08/03/2008                            DOB:          Jul 28, 1976    Mr. Eggebrecht is seen in followup for ventricular tachycardia, currently in  the setting of arrhythmogenic RV cardiomyopathy.  He is doing much  better, he has had no recurrent ventricular tachycardia and feels much  better off of his beta-blocker.  He does describe a tremor in his hand  and some difficulty holding on to things.  His activity is much less, he  has put on 10 pounds.   His only medications currently include the Mag-Oxide at 400 and the  amiodarone at 400 mg a day.   On examination, his blood pressure is 138/74, his pulse is 64, his  weight was 194, and his lungs were clear.  Heart sounds were regular.  The abdomen was soft and the extremities had no edema.  Interrogation of  his ICD demonstrated no intercurrent ventricular tachycardia.  His  activity index is back up to about 6 hours which is good for him and his  Optivol index is flat.   IMPRESSION:  1. Ventricular tachycardia.  2. Arrhythmogenic right ventricular dysplasia.  3  ??? 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  1. Intolerance to most beta-blockers.  2. 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   DISCUSSION:  Mr. Nomura has seen Dr. Dellia Cloud related to the last.  Related to the others, we will plan to decrease his amiodarone today  from 400 mg to 300 mg a day with a goal of being as low as we can  potentially, 100 mg - 200 mg over the next 3-6 months.  In the interim,  we will add sotalol which is a beta-blocker that he had tolerated pretty  well at a dose of 80 mg twice daily.   We will see him again in 3 months' time.  We will check him via [facility]  in each of the next 2 months looking  for episodes of ventricular  tachycardia.   In the event that things are quiet in 3 months, we will further decrease  his amiodarone to 200 mg a day.     [name], MD, [facility]  Electronically Signed    SCK/MedQ  DD: 08/03/2008  DT: 08/04/2008  Job #: [id number]

## 2010-12-31 NOTE — Assessment & Plan Note (Signed)
[facility]                         ELECTROPHYSIOLOGY OFFICE NOTE   NAME:Kosek, DAVID RODRIQUEZ                        MRN:          [id number]  DATE:10/27/2006                            DOB:          06/13/1976    Mr. Hillmer is seen following confirmation of his ARVD diagnosis undertaken  at [facility].  He is status post ICD implantation.  Sotalol was  switched to atenolol, and he is doing better.  He is back on the truck.   PHYSICAL EXAMINATION:  VITAL SIGNS: Blood pressure 134/73, pulse 64.  LUNGS: Clear.  CARDIAC: Heart sounds regular.  EXTREMITIES: Without edema.   Interrogation of his Medtronic ICD demonstrates R wave of 19.4,  impedance of 600, threshold 1 volt at 0.4.  Battery voltage was 3.20.  No intercurrent episodes.   IMPRESSION:  1. Arrhythmogenic right ventricular dysplasia.  2. Tachycardia with inducible ventricular flutter.  3. Status post implantable cardioverter-defibrillator, see above.   We will plan to see him again in one year's time.   We will plan to see his cousin and sister if need be for further  evaluation at their request.     [name], MD, [facility]  Electronically Signed    SCK/MedQ  DD: 10/27/2006  DT: 10/27/2006  Job #: [id number]

## 2010-12-31 NOTE — Letter (Signed)
June 27, 2008    Kinnie Scales. Dellia Cloud, PhD  [address]  [place], [place] [address]   RE:  [name], CARD  MRN:  [id number]  /  DOB:  07-07-1976   Dear Onalee Hua:   This letter is to introduce you to [name], who is having  significant anxiety, related to ventricular tachycardia storm that  occurred in the setting of his congenital cardiomyopathy and recent H1N1  flu infection.  He is doing much better from arrhythmia point of view,  he stopped.  He is feeling better off of his beta-blockers with more  sexual vibrancy.  There still hangs a significant amount of anxiety  about future shocks.  I would appreciate your help with that in him.   His medications currently include amiodarone 400 b.i.d., which I thought  we down titrated last time, but we did not, so we will down titrate it  today to 400 a day.  He is also on magnesium.   PHYSICAL EXAMINATION:  VITAL SIGNS:  His blood pressure was 127/83 with  a pulse of 63.  LUNGS:  Clear.  HEART:  Sounds were regular.  EXTREMITIES:  Without edema.   Interrogation of his ICD demonstrated no intercurrent episodes of  ventricular tachycardia.   IMPRESSION:  1. Arrhythmogenic right ventricular cardiomyopathy with recent      ventricular tachycardia storm.  2. 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  3. Amiodarone for Arrhythmogenic right ventricular cardiomyopathy with      recent ventricular tachycardia storm with previous intolerance of      beta-blockers.  4. H1N1 infection.   We will plan to check his potassium today as he had hypokalemia  previously.  We will down titrate his amiodarone from 400 twice a day to  once a day.  I will see him in a 1 months' time and if he remains VT  free, we will plan to decrease his amiodarone at that point to 200 mg a  day and begin him on sotalol 80 mg twice daily with hopes of maintain  him on 100 mg amiodarone and  low-dose sotalol.    Sincerely,      [name], MD, [facility]  Electronically Signed    SCK/MedQ  DD: 06/27/2008  DT: 06/28/2008  Job #: [id number]

## 2011-01-03 NOTE — Op Note (Signed)
[name], [name]:  [id number]   MEDICAL RECORD NO.:  [id number]          PATIENT TYPE:  INP   LOCATION:  3710                         FACILITY:  [facility]   PHYSICIAN:  [name], MD, FACCDATE OF BIRTH:  11/07/1975   DATE OF PROCEDURE:  DATE OF DISCHARGE:  06/04/2006                                 OPERATIVE REPORT   PREOPERATIVE DIAGNOSIS:  Presyncope with inducible fast ventricular  tachycardia.  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   POSTOPERATIVE DIAGNOSIS:  Presyncope with inducible fast ventricular  tachycardia.  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   PROCEDURE:  Single-chamber defibrillator implantation with intraoperative  defibrillation threshold testing.   After obtaining informed consent, the patient was brought to the  catheterization laboratory and placed in the fluoroscopy table in the supine  position.  After routine prep and drape of the right upper chest, an  incision was made in the prepectoral groove.  An incision was made and  carried down to the layer of the prepectoral fascia with electrocautery and  sharp dissection.  A pocket was formed similarly.  Hemostasis was obtained.   Thereafter our attention was turned __________  right subclavian vein which  was accomplished without difficulty and without the aspiration or puncture  of the artery.  A single needle puncture was accomplished.  A guidewire was  placed.  A routine 9-French sheath was placed, which was then passed a  Medtronic Sprint __________  cm dual-coil active fixation defibrillator  lead, serial #[id number].  Under fluoroscopic guidance it was manipulated to  the right ventricular apical region.  The MRI had demonstrated  cardiomyopathy in the apex, and so we put the tip of the lead about 1 to 2  cm up the septum.  In this location, the bipolar R-wave was greater than 30  millivolts with a pacing impedance of 939 ohms and a threshold  of 0.6 volts  at 0.5 milliseconds and a current threshold of 0.8 MA.  There was no  diaphragmatic pacing at 10 volts.  This lead was secured to the prepectoral  fascia and attached to a Medtronic Virtuoso [id number] defibrillator, serial  #[id number].  Through the device, a bipolar R-wave was 19 with a pacing  impedance of 672, a threshold of 1 volt at 0.2 milliseconds, proximal coil  impedance and distal coil impedances were within normal range, but I do not  have them recorded at this time.   Defibrillation threshold testing was then undertaken via the Assure data.  Ventricular fibrillation was induced via the T-wave shock.  After a total  duration of 80 seconds, a 15 joule shock was delivered through a measured  resistance of 37 ohms, terminating ventricular fibrillation and restoring  sinus rhythm.  Based on this, the DST was assumed to be 20 joules.  The  device was programmed accordingly.  The pocket was copiously irrigated with  antibiotic containing saline solution.  Hemostasis was assured, and the  leads and the pulse generator were placed in the pocket and secured to the  prepectoral fascia.  The wound was closed in 3 layers in the normal fashion.  The wound was washed,  dried, and a  benzoin and Steri-Strip dressing was applied.  Needle count,  sponge count, and instrument counts were correct at the end of the procedure  according to the staff.  The patient tolerated the procedure without  apparent complications.           ______________________________  [name], MD, [facility]     SCK/MEDQ  D:  06/04/2006  T:  06/04/2006  Job:  [id number]

## 2011-01-03 NOTE — Op Note (Signed)
[place]  Patient:    [name], [name]                        MRN: [id number] Proc. Date: 12/31/99 Adm. Date:  [id number] Disc. Date: [id number] Attending:  Ronne Binning Dictator:   Nicki Reaper, M.D. CC:         Nicki Reaper, M.D. 2 copies                           Operative Report  PREOPERATIVE DIAGNOSIS:  Saw injury to left hand.  POSTOPERATIVE DIAGNOSIS:  Saw injury to left hand.  OPERATION: 1. Revision amputation, index finger. 2. Repair of open fracture, middle phalanx middle finger. 3. Repair of flexor digitorum fundus. 4. Repair of flexor sheath. 5. Repair of ulnar digital artery. 6. Nerve graft to ulnar digital nerve with a nerve donor site from the repair    laceration ring finger, left hand.  SURGEON:  Dr. Merlyn Lot.  ANESTHESIA:  General.  ANESTHESIOLOGIST:  Edwin Cap. Zoila Shutter, M.D.  HISTORY OF PRESENT ILLNESS:  The patient is a 35 year old male who suffered a severe saw injury to his left hand.  He was seen in the emergency room and transferred to [facility] for definitive care for the most timely treatment.  DESCRIPTION OF PROCEDURE:  The patient was brought to the operating room where a general anesthesia was carried out without difficulty.  He was prepped and draped using Betadine scrubbing solution in the supine position with the left arm free.  The [name] was exsanguinated with an Esmarch bandage.  Tourniquet was placed high on the arm and was inflated to 250 mmHg.  The index finger was tended to first.  The fracture to the proximal phalanx was non-displaced, as such was not further treated.  The middle phalanx was ronguered back, smoothing this off.  The digital nerves were delivered distally and transected.  This allowed a segment of nerve to be harvested from the ulnar side.  The radial side had been evulsed.  The skin was then debrided, and was able to be rotated over and closed as a flap over the  distal portion of the middle phalanx.  This was sutured into position after irrigation with interrupted 5-0 Nylon sutures.  The middle finger was attended to next.  The digital artery and nerve on the ulnar aspect were found to be lacerated, as was the profundus tendon.  This was a partial injury with a radial half intact.  The ulnar half was then repaired with 6-0 Mersilene sutures using modified Kessler and 6-0 in the epitenon as much as possible.  There was some loss of tendon substance.  The repair, however, did smooth the area.  The sheath was then repaired after debridement with figure-of-eight 6-0 Mersilene sutures.  A debridement of the fracture was then performed.  This did not destabilize the area with a segment removed on the ulnar aspect.  The operative microscope was brought into position.  The artery was cut back to normal intima.  This allowed the artery to be delivered distally and repair performed with interrupted 9-0 Nylon sutures using the backwall first technique.  As we have done after irrigation and dilation.  The nerve transected back to normal fascicles proximally and distally, however, this did not allow the nerve to be repaired primarily, and as such, the nerve graft taken from the index  finger was used.  This was placed, sutured into position aligning fascicles with interrupted 9-0 Nylon sutures.  This was done proximally as one single unit, distally the bifurcation had occurred, and the two nerves were sutured into the nerve graft distally, again with interrupted 9-0 Nylon sutures using an epitenon suture, there was no tension on the repair site.  The wound was irrigated.  The skin was closed with interrupted 5-0 Nylon sutures.  The ring finger, digital artery and nerve were explored. Found to be intact.  The skin was repaired with interrupted 5-0 Nylon sutures. There was no laceration into the flexor tendon.  A sterile compressive dressing and splint was  applied.  The patient tolerated the procedure well, and was taken to the recovery room for observation in satisfactory condition.  He is discharged home to return to the [facility] in one week on Percocet and Keflex. DD:  12/31/99 TD:  01/02/00 Job: [id number]

## 2011-01-03 NOTE — H&P (Signed)
NAME:  [name], [name]:  [id number]   MEDICAL RECORD NO.:  [id number]          PATIENT TYPE:  EMS   LOCATION:  MAJO                         FACILITY:  [facility]   PHYSICIAN:  Rod Holler, MD     DATE OF BIRTH:  [birth year]   DATE OF ADMISSION:  05/30/2006  DATE OF DISCHARGE:                                HISTORY & PHYSICAL   CHIEF COMPLAINT:  Chest pain.   HISTORY OF PRESENT ILLNESS:  Mr. Obar is a pleasant 35 year old male with no  significant past medical history who presents to the emergency department  via EMS due to complaints of chest pain.  Of note, the patient has had  episodes of palpitations in the past.  Tonight, he had the onset of a fast  and irregular heart beat during sexual intercourse.  With this irregular and  fast heart beat, there was associated chest pain without radiation.  The  pain was on the left side of his chest.  He also describes some shortness of  breath, lightheaded, weakness.  This discomfort lasted for approximately 15  minutes and then subsided.  He had no associated nausea or diaphoresis.  He  had no syncope or true presyncope.  Currently, the patient is asymptomatic.  He denies illicit drug use.  With the similar episodes in the past, the  patient states they do not last as long as the episode did tonight, and were  not as intent.   PAST MEDICAL HISTORY:  None.   MEDICATIONS:  None.   SOCIAL HISTORY:  The patient is a paramedic, smokes one pack per day.   FAMILY HISTORY:  No known history of premature coronary artery disease.   REVIEW OF SYSTEMS:  All the systems are reviewed in detail and are negative,  except as noted in the history of present illness.   PHYSICAL EXAMINATION:  VITAL SIGNS:  Temperature 97.8.  Heart rate 86.  Respiratory rate 19.  Oxygen saturation 97% on room air.  GENERAL:  A well-developed, well-nourished young male, alert and oriented  x3, in no apparent distress.  HEENT:  Atraumatic,  normocephalic.  Pupils equal, round and reactive to  light.  Extraocular movements intact.  Oropharynx clear.  Jewelry through  his eyebrow.  NECK:  Supple.  No adenopathy.  No JVD.  No carotid bruits.  CHEST:  Lungs are clear to auscultation bilaterally with equal bilateral  breath sounds.  CORONARY:  Regular rhythm, normal rate, normal S1 and S2.  No murmurs, rubs  or gallops, 2+ peripheral pulses, no femoral bruits.  ABDOMEN:  Soft, nontender, nondistended.  Active bowel sounds.  No  hepatosplenomegaly.  EXTREMITIES:  No clubbing, cyanosis or edema.  NEUROLOGIC:  No focal deficits.  SKIN:  Multiple tattoos.   Chest x-ray, no acute disease.   LABS:  White blood cell count 9.7, hematocrit 43.0, platelet count 243.  Sodium 139, potassium 3.7, chloride 105, bicarb 26, BUN 6, creatinine 1.1,  glucose 97.  Initial set of cardiac enzymes, troponin 0.18, CK-MB 2.9,  myoglobin 125.  Subsequent set of cardiac enzymes, CK-MB  4.5, troponin 0.27,  myoglobin 88.  EKG shows normal sinus rhythm, septal infarct.   IMPRESSION/PLAN:  Patient is a 35 year old male, possible supraventricular  tachycardia, abnormal troponin levels with a normal CK-MB and myoglobin.   PLAN:  1. Admit the patient to telemetry, serial cardiac enzymes, aspirin,      Lipitor, Lopressor.  I doubt the patient can tolerate an ACE inhibitor      due to his blood pressure.  Continue the heparin drip that was started      in the emergency department, daily EKGs, lipid panel.  2. Guaiac occult stools.  3. CMP, magnesium, thyroid function tests.  4. Urine drug screen.      Rod Holler, MD  Electronically Signed     TRK/MEDQ  D:  05/30/2006  T:  05/30/2006  Job:  [id number]

## 2011-01-03 NOTE — Assessment & Plan Note (Signed)
[facility]                              CARDIOLOGY OFFICE NOTE   NAME:[name]                        MRN:          [id number]  DATE:07/02/2006                            DOB:          1976-07-28    CARDIOLOGIST:  Dr. Sherryl Manges.   PRIMARY CARE PHYSICIAN:  He sees one of the providers at our [facility]  office.   HISTORY OF PRESENT ILLNESS:  [name] is a very pleasant, 35 year old who  was recently admitted to the hospital for tachypalpitations and presyncope.  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  He underwent a cardiac catheterization which  showed normal coronary arteries and normal LV function.  He was placed on  Sotalol but had some side effects to this and Dr. Graciela Husbands initially took him  off of this.  He presents to the office today for an unscheduled appointment  with complaints of pain in his right groin site.  He notes no fevers or  chills.  He denies chest pain.  He denies any syncope.  Denies any  palpitations.  Denies any dyspnea.  He has noted some purulent drainage from  the groin site.  The patient is status post AICD implantation with a  Medtronic device, June 04, 2006, with Dr. Graciela Husbands.   CURRENT MEDICATIONS:  None.   ALLERGIES:  No known drug allergies.   PHYSICAL EXAM:  He is a well-nourished, well-developed male.  Blood pressure is 118/68.  Pulse 65.  Weight 176 pounds.  Temperature is  96.6.  HEENT:  Unremarkable.  CARDIAC:  S1, S2.  Regular rate and rhythm.  LUNGS:  Clear to auscultation bilaterally.  ABDOMEN:  Soft, nontender.  EXTREMITIES:  Without edema.  Calves are soft, nontender.  Right groin  demonstrates no hematomas or bruit over the right femoral artery stick site.  He does, however, have an area of induration superior to this that measures  about 3 cm x 1 cm. There is an area of central excoriation in the center  that has  obviously been draining.  There is a small amount of erythema  surrounding the lesion.   IMPRESSION:  1. Right groin furuncle.  2. Normal coronary arteries.  3. Normal left ventricular function.  4. 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      a.     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   PLAN:  The patient presents to the office today with complaints of pain and  swelling of his right groin.  I think his right femoral artery site is  stable.  There are no hematomas.  There are no bruits.  He has, however,  developed a furuncle.  This is probably related to shaving prior to his  catheterization.  I have recommended warm compressed 3 times a day.  I have  also recommended Keflex 500 mg 3 times a day for 10 days.  We will  tentatively set him up for followup on Monday of next week.  However, if his  site is improving, he can cancel his appointment.  If there is no  improvement, we will need to consider a referral to a general surgeon for  incision and drainage.  Dr. Odessa Fleming nurse will be touch with the patient  regarding his further therapy for monomorphic VT.  He notes that he does  feel better off of the sotalol.  He is still somewhat weak but thinks it is  probably related to everything that has happened in the last several weeks.      Tereso Newcomer, PA-C  Electronically Signed      Luis Abed, MD, [facility]  Electronically Signed   SW/MedQ  DD: 07/02/2006  DT: 07/02/2006  Job #: [id number]

## 2011-01-03 NOTE — Assessment & Plan Note (Signed)
[facility]                         ELECTROPHYSIOLOGY OFFICE NOTE   NAME:Short, [name] CARDINAL                        MRN:          [id number]  DATE:12/22/2006                            DOB:          May 20, 1976    [name] comes in.  He had a presumptive diagnosis of ARVD, and he got  shocked while having sex.  He had no antecedent symptoms.   He had been on atenolol 50.  He decreased it to 25 because of fatigue.   His resting heart rate is in the high 50s.   PHYSICAL EXAMINATION:  VITAL SIGNS:  On examination today, his blood  pressure is 128/72, pulse 70.  LUNGS:  Clear.  HEART:  Heart sounds were regular.   Interrogation of his ICD demonstrated a number of episodes of  nonsustained arrhythmia, one of which clearly VT, others of which have  PVCs with a changing morphology of the near-field electrogram also  consistent with VT, and then the shock rhythm is harder to interpret in  that it has a near-field electrogram quite similar to sinus apart from  the fact that the S wave is a little bit broader.  The initiation is  without a pause so that the substrate identified at EP study for AV  nodal reentry would not likely be the mechanism of this very fast  tachycardia that had a cycle length of 210 to 220 milliseconds.  His EP  study also showed decremental and concentric retrograde conduction, thus  making an AV reentry tachycardia quite unlikely, and an atrial  tachycardia that went this fast without warm up is also unlikely in my  mind so that based on [employer] and what we know about his  heart, it is most likely VT.   One alternative is to go back to the lab and reinvestigate.  The other  is to undertake empiric therapy, as the resting ambient heart rate prior  to tachycardia was about 140 with a cycle length of 440 to 220  milliseconds.   Based on that, I have given him prescriptions for Inderal LA 80, nadolol  40, and Toprol-XL 50 to  take in random order to see which of these he  can tolerate, and we will then work to further uptitrate them as  tolerated.   I will see him again in five to six weeks' time.     [name], MD, [facility]  Electronically Signed    SCK/MedQ  DD: 12/22/2006  DT: 12/22/2006  Job #: [id number]

## 2011-01-03 NOTE — Op Note (Signed)
NAMEKIWAN, [name]:  [id number]   MEDICAL RECORD NO.:  [id number]          PATIENT TYPE:  INP   LOCATION:  3710                         FACILITY:  [facility]   PHYSICIAN:  [name], MD, FACCDATE OF BIRTH:  03-Oct-1975   DATE OF PROCEDURE:  06/03/2006  DATE OF DISCHARGE:  06/04/2006                                 OPERATIVE REPORT   PREOPERATIVE DIAGNOSIS:  Tachy palpitations with arrhythmia, right  ventricular dysplasia (presumed).   POSTOPERATIVE DIAGNOSIS:  Tachy palpitations with arrhythmia, right  ventricular dysplasia (presumed).   PROCEDURE:  Implantation of a single-chamber defibrillator with  intraoperative defibrillation and threshold testing.   DESCRIPTION OF PROCEDURE:  Following obtaining informed consent, the patient  was brought to the electrophysiology laboratory and placed on the  fluoroscopic table in a supine position.  After routine prep and drape of  the left upper chest, lidocaine was infiltrated in the prepectoral and  subclavicular region.  Incision was made and carried down to the layer of  the prepectoral fascia using electrocautery and sharp dissection.  A pocket  was formed surally.  Hemostasis was obtained.   Thereafter, attention was turned to gain access to the right extrathoracic  subclavian vein, the right side having been chosen because of tattoos on the  left chest.  This was accomplished without difficulty which was accomplished  without difficulty.  A 9-French sheath was placed, and through this was  passed Medtronic Sprint Quatro [phone] dual-coil activation defibrillator lead,  serial number [id number].  Under fluoroscopic guidance, it was manipulated  to the right ventricular apex where we had to find a site upon the septum a  little bit because the apex had inadequate numbers.  The R wave was 30 mV  with a pacing impedance of 939 ohms, a threshold of 0.2 volts at 0.5 ms.  Current threshold was 0.8 mA, and there was  no diaphragmatic pacing at 10  volts.  The lead was secured in the prepectoral fashion and then attached to  a Medtronic Virtuoso [id number] defibrillator, serial number [id number].  Through this device, the bipolar R wave was 19 mV with a pacing impedance of  672 ohms, a threshold of 1 volt of 0.2 msec.   Thereafter, defibrillation threshold testing was undertaken.  Ventricular  fibrillation was induced using T-wave shock.  After a total duration of 8  seconds, a 15-joule shock was delivered through a measured resistance of 37  ohms, terminating ventricular fibrillation and restoring sinus rhythm.   At this point, the device was implanted with a DFT assumed to be 21 joules  based on the ASSURE trial.  The pocket was copiously irrigated with  antibiotic-containing saline solution.  Hemostasis was ensured, and the lead  and the pulse generator were placed in the pocket and secured to the  prepectoral fascia.  The wound was then closed in 3 layers in the normal  fashion.  The wound was washed, dried, and a benzoin and Steri-Strip  dressing was applied.  Needle counts, sponge counts, and instrument counts  were correct at the end of the  procedure according to the staff.  The  patient tolerated the procedure without apparent complication.          ______________________________  [name], MD, [facility]    SCK/MEDQ  D:  06/09/2006  T:  06/10/2006  Job:  [id number]

## 2011-01-03 NOTE — Discharge Summary (Signed)
[name], [name]:  [id number]   MEDICAL RECORD NO.:  [id number]          PATIENT TYPE:  INP   LOCATION:  3710                         FACILITY:  [facility]   PHYSICIAN:  [name], MD, FACCDATE OF BIRTH:  07/07/1976   DATE OF ADMISSION:  05/30/2006  DATE OF DISCHARGE:  06/04/2006                           DISCHARGE SUMMARY - REFERRING   DISCHARGE DIAGNOSES:  1. Ventricular tachycardia.  2. Tachy-palpitations with pre-syncope.  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   PROCEDURES PERFORMED:  1. Cardiac catheterization on June 01, 2006 by Dr. Excell Seltzer.  2. Electrophysiology evaluation on June 01, 2006 by Dr. Ladona Ridgel.  3. Medtronic implantable cardioverter-defibrillator Virtuoso inserted on      June 03, 2006 by Dr. Graciela Husbands.   SUMMARY OF HISTORY:  [name] is a 35 year old male, who presented to the  emergency room via EMS secondary to chest discomfort.  He described the  onset of a fast irregular heartbeat during sexual intercourse.  This was  associated with chest discomfort without radiation.  He also describes some  shortness of breath, lightheadedness and generalized weakness.  The  discomfort lasted for 15 minutes and then subsided.  He did not have any  syncope.  He does have a history of palpitations in the past.   PAST MEDICAL HISTORY:  Notable for tobacco use.   LABORATORY:  Chest x-ray on the 13th did not show any active disease.  Cardiac function MRI showed bulging of the free wall of the right ventricle  most notable apical region and less so along the upper right ventricle  towards the right ventricle outflow tract.  These findings may represent  secondary findings of right ventricular dysplasia.  Admission weight was not  recorded.  Admission H&H 15 and 44, normal indices, platelets 243, WBCs 9.7.  Subsequent hematologies were essentially unremarkable.  PT was 13.2 on the  14th.  Admission  sodium was 140, potassium 3.7, BUN 3, creatinine 0.8,  glucose 111, AST and ALT were slightly elevated at 80 and 96.  CK totals  were within normal limits at 210, 199 and 176, MBs were 8.3, 8.1 and 4.8  with relative indexes of 4, 4.1 and 2.7 respectively.  Troponins were 1.04,  1.15 and 0.7.  Cholesterol was 173, triglycerides 149, HDL 40, LDL 103.  TSH  was 1.392, free T4 was 1.49.  Urine drug screen was negative.  EKG on  admission showed normal sinus rhythm with a ventricular rate to 86 and  slightly delayed R-wave   HOSPITAL COURSE:  [name] was admitted to [facility] by Dr. Samuel Bouche.  He was placed on IV heparin as well as statin, beta blocker and aspirin.  Overnight, he did not have any further tachy-palpitations with his abnormal  troponins and family history of premature heart disease and abnormal EKG, it  was felt that he should undergo cardiac catheterization.  Echocardiogram was  performed on the 13th, however, at the time of this dictation,  echocardiogram results were not available.  After enzymes and troponins were  back, Dr. Diona Browner felt that he had a non-ST-segment elevated myocardial  infarction in the setting of tachycardia.  Nursing noted on the telemetry  monitor a 9-beat run of ventricular tachycardia.  Cardiac catheterization  performed by Dr. Excell Seltzer on October 15, showed normal coronaries.  EF was 55%  without wall motion abnormalities.  Dr. Excell Seltzer felt that EP should evaluate  for his tachycardias.  Dr. Excell Seltzer felt that his elevated troponin was likely  secondary to hemodynamically significant SVT.  Tobacco cessation consult was  performed on the 15th.  EP consultation was performed with Loura Pardon and  Dr. Ladona Ridgel and the patient agreed to EP testing.  On 10/15, Dr. Ladona Ridgel  performed inducible monomorphic ventricular tachycardia.  Signal average  EKG, MRI and ICD was planned.  Dr. 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  Findings were discussed with the patient and his  mother.  The patient was placed on sotalol.  On October 17, Medtronic ICD  was placed by Dr. Graciela Husbands without difficulty.  Dr. Graciela Husbands on the morning of the  18th was awaiting Medtronic interpretation.  This was performed by Caryn Bee and  showed normal device functioning in all parameters within normal limits.  Dr. Graciela Husbands reviewed the signal average EKG and felt that this was markedly  abnormal on 3:3 criteria.  He felt that he could be discharged home.   DISPOSITION:  The patient was advised that he is not to return to work until  after being evaluated by Dr. Graciela Husbands.  He is advised a low-salt, fat  cholesterol diet.  His activities and wound care are per supplemental  discharge sheets in regards to his catheterization, EP study and ICD  insertion.  His activities are also provided on the supplemental sheet post  ICD insertion however, Dr. Ladona Ridgel recommended no driving until after  evaluation by Dr. Graciela Husbands in the office.  New medications include Percocet 1-2  tablets q. 4-6 hours p.r.n., he received 30 tablets without refills.  Chantix as directed and sotalol 80 mg b.i.d.  He was advised no smoking or  tobacco products.  He will have a wound check on June 17, 2006 at 10:00  a.m. at Dr. Odessa Fleming office.  He will have a stress test on sotalol with Dr.  Graciela Husbands on  June 26, 2006 at 12 o'clock and follow up with Dr. Graciela Husbands in the  office on September 29, 2006 at 11:00 a.m.  Discharge time greater than 30  minutes.     ______________________________  Joellyn Rued, PA-C    ______________________________  [name], MD, [facility]    EW/MEDQ  D:  06/04/2006  T:  06/05/2006  Job:  [id number]   cc:   [facility]  [name], MD, [facility]

## 2011-01-03 NOTE — Assessment & Plan Note (Signed)
[facility]                         ELECTROPHYSIOLOGY OFFICE NOTE   NAME:Short, [name] DUFFUS                        MRN:          [id number]  DATE:07/23/2006                            DOB:          07-30-1976    [name] is seen.  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  He is status  post ICD implantation.  He is continuing to adjust to the limits of his  illness.   We are working on getting him set up to see [facility] to see if we  can get a confirmation of the diagnosis.  Preferably he will see me in  three months' time.     [name], MD, [facility]  Electronically Signed    SCK/MedQ  DD: 07/23/2006  DT: 07/23/2006  Job #: [id number]

## 2011-01-03 NOTE — Op Note (Signed)
[name], [name]                 ACCOUNT NO.:  [id number]   MEDICAL RECORD NO.:  [id number]          PATIENT TYPE:  INP   LOCATION:  3710                         FACILITY:  [facility]   PHYSICIAN:  Doylene Canning. Ladona Ridgel, MD    DATE OF BIRTH:  1976/04/07   DATE OF PROCEDURE:  06/01/2006  DATE OF DISCHARGE:                                 OPERATIVE REPORT   PROCEDURE PERFORMED:  Invasive electrophysiologic study.   INDICATIONS:  Symptomatic palpitations and nonsustained VT.   INTRODUCTION:  The patient is a 35 year old man who has a history of  palpitations resulting in admission to the hospital.  The patient states  that his heart had raced in the past for as much as five minutes, but the  day of admission, he developed sustained racing lasting over 20 minutes  associated with strenuous activity (sexual relations).  The patient  subsequently presented to the hospital and he had PVCs during his hospital  stay and underwent catheterization demonstrating normal coronary arteries  and normal LV systolic function.  He is now referred for invasive  electrophysiologic study and possible catheter ablation.   PROCEDURE:  After informed consent was obtained, the patient is taken to the  diagnostic EP lab in a fasting state.  After the usual preparation and  draping, intravenous fentanyl and midazolam were given for sedation.  A 5-  French quadripolar catheter was inserted percutaneously in the left femoral  vein and advanced to the RV apex.  A 5-French quadripolar catheter was  inserted percutaneously in the left femoral vein and advanced the His bundle  region.  A 6-French hexapolar catheter was inserted percutaneously in the  right jugular vein and advanced to the coronary sinus.  After measurement of  the basic intervals, rapid ventricular pacing was carried out from the RV  apex demonstrating VA dissociation at 600 milliseconds.  Programmed  ventricular stimulation also demonstrated VA  dissociation at 600  milliseconds.  Next, programmed atrial stimulation was carried out from the  coronary sinus and the high right atrium base drive cycle length of 161,  500, and 400 milliseconds.  The S1-S2 interval was stepwise decreased down  to 280 milliseconds where AV node ERP was observed.  During programmed  atrial stimulation, there were AH jumps but no echo beats and no inducible  SVT.  Next, rapid atrial pacing was carried out from the coronary sinus and  high right atrium at a base cycle length of 600 milliseconds and stepwise  decreased down to 400 milliseconds where AV Wenckebach was observed.  During  rapid atrial pacing, the PR interval was greater than the RR interval but,  again, there was no inducible SVT.  At this point, isoproterenol was infused  at a rate of anywhere from 1 to 4 mcg per minute and additional programmed  atrial stimulation was carried out as well as rapid atrial pacing, but there  was no inducible SVT.  At this point, ventricular pacing was again carried  out demonstrating improvement in [place] conduction with a VA Wenckebach cycle  length of 440 milliseconds.  During  rapid ventricular pacing, the atrial  activation sequence was midline and decremental.  At this point, the  diagnosis resulting in the patient's symptoms were unclear and programmed  ventricular stimulation was carried out from the RV apex at base drive cycle  length of 161 milliseconds.  S1-S2, S1-S2-S3 stimuli were delivered.  At S1-  S2-S3, coupling interval 400/230/280, there was inducible ventricular  tachycardia (ventricular flutter) cycle length of 210 milliseconds.  This  was associated hemodynamic instability and could not be pace terminated  despite pacing at 200 milliseconds.  Defibrillation at 200 joules was then  delivered restoring sinus rhythm.  Five minutes was allowed to elapse.  During this time, isoproterenol was discontinued and programmed ventricular  stimulation was  again carried out at base drive cycle length this time of  500 milliseconds.  At an S1-S2 coupling interval of 500/260, there was again  inducible ventricular tachycardia.  At this time pace terminable by  ventricular pacing at 200 milliseconds.  At this point, no additional  programmed stimulation was carried out and the catheters were removed,  hemostasis was assured, and the patient was returned to his room in  satisfactory condition.   COMPLICATIONS:  There were no immediate procedure complications.   RESULTS:  1. Baseline ECG:  The baseline ECG demonstrates sinus rhythm with normal      axis intervals.  There was a diminution of R-waves across the anterior      precordium.  2. Baseline intervals:  The sinus node cycle length was 1000 milliseconds,      the HV interval 63 milliseconds, the QRS duration was 84 milliseconds.  3. RV pacing:  Right ventricle pacing was carried out from the RV apex and      stepwise decreased down to 80 milliseconds.  During rapid ventricular      pacing, the atrial activation was midline and decremental.  There was      no inducible arrhythmias.  4. Programmed ventricular stimulation:  Programmed ventricular stimulation      was carried out with and without isoproterenol at a base drive cycle      length of 540 milliseconds.  S1-S2 and S1-S2-S3 stimuli were delivered      with the S1-S2 and S1-S2-S3 interval stepwise decreased down to      induction of VT.  At S1-S2-S3 coupling interval of 400/230/280, there      is inducible SVT, and at S1-S2 coupling interval of 500/260, there is      inducible VT.  5. Rapid atrial pacing:  Rapid atrial pacing was carried out from the      coronary sinus and high right atrium at base drive cycle length of 096      milliseconds and stepwise decreased down to 400 milliseconds where AV      Wenckebach was observed.  During rapid atrial pacing the, PR interval      was greater than the RR interval suggesting dual AV  nodal physiology,     however, there was no inducible SVT.  6. Programmed atrial stimulation:  Programmed atrial stimulation was      carried out from the coronary sinus and high right atrium at base drive      cycle length of 500 and 600 milliseconds as well as 400 milliseconds.      The S1-S2 interval was stepwise decreased down to the AV node ERP.      Without isoproterenol, this was 600/280.  On isoproterenol, it was  400/220.  During programmed atrial stimulation, there were no inducible      arrhythmias.  7. Arrhythmias observed:      a.     Ventricular tachycardia, initiation programmed ventricular       stimulation, duration sustained, termination was DC cardioversion or       rapid ventricular pacing.   CONCLUSION:  This study demonstrates inducible monomorphic ventricular  tachycardia and ventricular flutter in a patient with a history of  palpitations and near syncope.  The findings along with his nonsustained VT  and frequent PVCs are consistent with arrhythmogenic right ventricular  dysplasia.  He will be referred for signal average ECG, cardiac MRI, and  ultimately ICD implantation.           ______________________________  Doylene Canning. Ladona Ridgel, MD     GWT/MEDQ  D:  06/01/2006  T:  06/02/2006  Job:  [id number]

## 2011-01-03 NOTE — Assessment & Plan Note (Signed)
[facility]                              CARDIOLOGY OFFICE NOTE   NAME:Rosenboom, RODRECUS BELSKY                        MRN:          [id number]  DATE:06/26/2006                            DOB:          1976/05/24    Mr. Arora had a conversation following his treadmill test.  We discussed his  difficulty in coping with this problem related to limitations on driving,  mood swings, energy fluctuations, problems with memory.  Some of this may be  related to his Sotalol so I have asked him to discontinue his Sotalol.  He  is to let us know next week.  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  He was put on Celexa and  this functioned to stabilize his mood but left him feeling somewhat  passively engaged in life and he did not like that feeling.  He is to let us  know next week how he is feeling and whether there is a potential roll for  counseling intervention.  We will see him again in four weeks time.     [name], MD, [facility]  Electronically Signed    SCK/MedQ  DD: 06/26/2006  DT: 06/26/2006  Job #: [id number]

## 2011-01-03 NOTE — Assessment & Plan Note (Signed)
 [facility]ALTHCARE                           ELECTROPHYSIOLOGY OFFICE NOTE   NAME:Tippett, BREVON DEWALD                        MRN:          [id number]  DATE:06/17/2006                            DOB:          [birth year]    Mr. Florek was seen today in the clinic on June 17, 2006, for a wound check  of his newly-implanted Medtronic model number [id number] Virtuoso.  Date of  implant was June 04, 2006, for inducible VT.  On interrogation of his  device today, his battery voltage was 3.20 with a charge time of 10.2  seconds.  R waves measured 19.4 millivolts with a ventricular pacing  threshold of 0.5 volts at 0.4 milliseconds and a ventricular lead impedance  of 464.  Shock impedance was 49.  There was one nonsustained episode since  implant date.  No changes were made in his parameters.  Steri-Strips were  removed today.  His wound was without redness or edema.  He will return on  June 26, 2006, for a treadmill with Dr. Graciela Husbands.      Altha Harm, LPN    ______________________________  [name], MD, [facility]   PO/MedQ  DD: 06/17/2006  DT: 06/17/2006  Job #: [id number]

## 2011-01-03 NOTE — H&P (Signed)
[place]  Patient:    [name], [name]                          MRN: [id number] Adm. Date:  12/31/99 Attending:  Nicki Reaper, M.D. CC:         Nicki Reaper, M.D. 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  This occurred today with a skill saw.  PAST MEDICAL HISTORY:  Negative.  He is right-hand dominant.  He has no prior history of injuries.  MEDICATIONS:  He takes no medicines.  ALLERGIES:  Has no allergies.  PAST SURGICAL HISTORY:  Has had no prior surgery.  FAMILY HISTORY:  Negative for diabetes, thyroid problems, arthritis and gout.  REVIEW OF SYSTEMS:  Negative.  PHYSICAL EXAMINATION:  GENERAL:  Well-developed, well-nourished male in no acute distress.  He has a severe injury to his index, middle, and ring fingers of his left hand.  He has pain with attempted flexion of the middle finger.  He has an amputation just distal to the PIP joint of the index finger.  The ring finger shows a volar laceration.  Sensation is intact to the ring, diminished on the ulnar aspect of the middle.  The amputated specimen of the index revealed no middle phalanx, with destruction of the distal interphalangeal joint and as such is not replantable without significant shortening.  CHEST:  Clear.  ABDOMEN:  Soft and nontender without mass or organomegaly.  He has no other injuries.  DIAGNOSTIC STUDIES:  X-rays reveal the amputation injury to the middle phalanx, fracture to the proximal phalanx, ulnar collateral ligament attachment.  PLAN:  Operative intervention with repair of tendons.  A revision amputation to the index.  Repair arteries and nerves to the middle.  Due to time constraints in the operating room, he is transferred to [facility] for definitive procedure. DD:  12/31/99 TD:  12/31/99 Job: [id number]

## 2011-01-03 NOTE — Cardiovascular Report (Signed)
[name], [name]                 ACCOUNT NO.:  [id number]   MEDICAL RECORD NO.:  [id number]          PATIENT TYPE:  INP   LOCATION:  3710                         FACILITY:  [facility]   PHYSICIAN:  Veverly Fells. Excell Seltzer, MD  DATE OF BIRTH:  07/11/1976   DATE OF PROCEDURE:  DATE OF DISCHARGE:                              CARDIAC CATHETERIZATION   PROCEDURE:  Left heart catheterization, selective coronary angiography, left  ventricular angiography, Angio-Seal of the right femoral artery.   INDICATION:  Mr. Freiberger is a very pleasant 35 year old man, who presented to  the hospital with chest pain and tachycardia.  He had elevated troponin and  was subsequently referred for a cardiac catheterization in the setting of  his elevated cardiac biomarkers.   PROCEDURAL DETAILS:  Risks and indications of the procedure were explained  in detail to the patient.  Informed consent was obtained.  The right groin  was prepped, draped, and anesthetized with 1% lidocaine under normal sterile  conditions.  Using a modified Seldinger technique a 6-French arterial sheath  was placed in the right common femoral artery.  Multiple angiographic views  above the left and right coronary arteries were taken.  For the left  coronary artery a JL4 catheter was used, for the right coronary artery a JR4  catheter was used.  Following selective coronary angiography an angled  pigtailed catheter was placed in the left ventral.  Left ventricular  pressures were recorded.  Left ventriculography was performed in the 30-  degree anterior oblique projection.  Following left ventriculography a  pullback across the aortic valve was performed.  At the conclusion of the  case a 6-French Angio-Seal was used to seal the right femoral arteriotomy.  All caths were exchanged were performed over a guidewire.   FINDINGS:  Aortic pressure 103/65 with a mean of 84, left ventricular  pressure 100/4 with an end diastolic pressure of 13, there is  no aortic  stenosis.   CORONARY ANATOMY:  Left main stem is normal.  Trifurcates into the LAD,  ramus intermedius, and left circumflex.  The LAD is a large diameter vessel  that courses down to the left ventricular apex.  It gives off a large first  diagonal vessel that bifurcates in its distal portion.  There is also a  large septal perforator that branches into multiple vessels.  The LAD gives  off a lower diagonal down in the mid distal LAD junction.  The LAD and  diagonals are angiographically normal.   Ramus intermedius is small diameter and branches into 2 vessels.  It is  angiographically normal.   Left circumflex is a medium caliber vessel that courses down the atria  ventricular groove and gives off a first and second obtuse marginal both of  which are small.  The third obtuse marginal is medium caliber.  The left  circumflex system is angiographically normal.   Right coronary artery is dominant.  It gives off a small conus branch and a  small right ventricular marginal branch.  In its distal portion that  bifurcates into the PDA and posterior AV  segment.  The posterior AV segment  gives off 2 posterior lateral branches.  The right coronary artery is  angiographically normal.   Left ventriculographically demonstrates normal left ventricular function  with the left ventricular ejection fraction of 55%.   ASSESSMENT:  1. Normal coronary arteries.  2. Normal left ventricular function.   PLAN:  The patient is going to go for a EP study with intention to ablate  his supraventricular tachycardia.  I suspect his troponin rise was secondary  to hemodynamically significant supraventricular tachycardia.  He has no  obstructive coronary artery disease.      Veverly Fells. Excell Seltzer, MD  Electronically Signed     MDC/MEDQ  D:  06/01/2006  T:  06/01/2006  Job:  [id number]

## 2011-01-03 NOTE — Assessment & Plan Note (Signed)
[facility]                           ELECTROPHYSIOLOGY OFFICE NOTE   NAME:[name], [name]                        MRN:          [id number]  DATE:06/26/2006                            DOB:          1976-03-03    TREADMILL REPORT:  [name] was admitted for a treadmill test to see his  heart rate excursion and the inducibility of ventricular tachycardia in the  setting of his presumed arrhythmogenic dysplasia and easily inducible  ventricular tachycardia.   The patient was submitted to a modification of the Bruce protocol.  Unfortunately the blood pressures did not record well. His blood pressure at  rest was 121/72, at peak exercise it was 123/54 but this was taken about 30  seconds after exercise ended and unfortunately, I did not notice that the  blood pressure was not being updated.  Interestingly, one minute post  exercise was 119 and two minutes post exercise was 133, suggesting there may  have been an exercise associated hypotension.   There was some nonsustained ventricular tachycardia but no sustained  arrhythmias. Peak heart rate was 160 beats per minute or so.   The device was reprogrammed around the above.     [name], MD, [facility]  Electronically Signed    SCK/MedQ  DD: 06/26/2006  DT: 06/26/2006  Job #: [id number]

## 2011-01-09 ENCOUNTER — Ambulatory Visit (INDEPENDENT_AMBULATORY_CARE_PROVIDER_SITE_OTHER): Payer: PRIVATE HEALTH INSURANCE | Admitting: Internal Medicine

## 2011-01-09 ENCOUNTER — Encounter: Payer: Self-pay | Admitting: Internal Medicine

## 2011-01-09 ENCOUNTER — Other Ambulatory Visit: Payer: Self-pay

## 2011-01-09 ENCOUNTER — Encounter: Payer: Self-pay | Admitting: *Deleted

## 2011-01-09 VITALS — BP 110/82 | HR 76 | Ht 70.0 in | Wt 199.0 lb

## 2011-01-09 DIAGNOSIS — I428 Other cardiomyopathies: Secondary | ICD-10-CM

## 2011-01-09 DIAGNOSIS — Z95810 Presence of automatic (implantable) cardiac defibrillator: Secondary | ICD-10-CM

## 2011-01-09 DIAGNOSIS — I4729 Other ventricular tachycardia: Secondary | ICD-10-CM

## 2011-01-09 DIAGNOSIS — I472 Ventricular tachycardia: Secondary | ICD-10-CM

## 2011-01-09 DIAGNOSIS — F4310 Post-traumatic stress disorder, unspecified: Secondary | ICD-10-CM

## 2011-01-09 MED ORDER — SOTALOL HCL 120 MG PO TABS: 120.0000 mg | ORAL_TABLET | Freq: Two times a day (BID) | ORAL | Status: DC

## 2011-01-09 MED ORDER — ALPRAZOLAM 0.25 MG PO TABS: 0.2500 mg | ORAL_TABLET | Freq: Every evening | ORAL | Status: AC | PRN

## 2011-01-09 NOTE — Progress Notes (Signed)
  HPI  [name] is a 35 y.o. male Seen in followup for arrhythmogenic cardiomyopathy with a history of ventricular tachycardia status post ablation in [place] fall 2010.  He is a relatively well some arrhythmia point of view since then; he had an episode in March another one in April and then has had 6 episodes of VT overnight all pace terminated. There is associated presyncope.  Mentally there has been a great deal of difficulty dealing with the PTSD aspect of all of this.  The below listed medical history is erroneous  Past Medical History  Diagnosis Date  . Ventricular tachycardia     sotalol therapy; status post catheter ablation at [facility]  . Cardiomyopathy     arrhythmogenic cardiomyopathy-positive family history  . Anxiety   . ICD (implantable cardiac defibrillator) in place     Past Surgical History  Procedure Date  . Cardiac catheterization 06-01-06  . Cardiac defibrillator placement 06-03-06    [employer]    Current Outpatient Prescriptions  Medication Sig Dispense Refill  . ALPRAZolam (XANAX) 0.25 MG tablet Take 0.25 mg by mouth at bedtime as needed.        . cetirizine (ZYRTEC) 10 MG tablet Take 10 mg by mouth daily as needed.        . Magnesium Oxide 500 MG TABS Take by mouth daily.        . montelukast (SINGULAIR) 10 MG tablet Take 10 mg by mouth at bedtime.        . multivitamin (THERAGRAN) per tablet Take 1 tablet by mouth daily.        . sotalol (BETAPACE) 80 MG tablet Take 80 mg by mouth 2 (two) times daily.          Allergies no known allergies  Review of Systems negative except from HPI and PMH  Physical Exam Well developed and well nourished in no acute distress HENT normal E scleral and icterus clear Neck Supple JVP flat; carotids brisk and full Clear to ausculation Regular rate and rhythm, no murmurs gallops or rub Soft with active bowel sounds No clubbing cyanosis and edema Alert and oriented, grossly normal motor and sensory  function Skin Warm and Dry Affect anxious  ECG  Assessment and  Plan

## 2011-01-09 NOTE — Progress Notes (Deleted)
  HPI  [name] is a 35 y.o. male   Past Medical History  Diagnosis Date  . Atrial fibrillation   . CHB (complete heart block)   . Ischemic cardiomyopathy     with complete heart block  . Anxiety disorder   . Sleep apnea   . Mononucleosis 2003    with acute hepatitis  . Fatty liver     with HM  . Cholelithiasis     Past Surgical History  Procedure Date  . Cardiac catheterization 06-01-06  . Cardiac defibrillator placement 06-03-06    [employer]    Current Outpatient Prescriptions  Medication Sig Dispense Refill  . ALPRAZolam (XANAX) 0.25 MG tablet Take 0.25 mg by mouth at bedtime as needed.        . cetirizine (ZYRTEC) 10 MG tablet Take 10 mg by mouth daily as needed.        . Magnesium Oxide 500 MG TABS Take by mouth daily.        . montelukast (SINGULAIR) 10 MG tablet Take 10 mg by mouth at bedtime.        . multivitamin (THERAGRAN) per tablet Take 1 tablet by mouth daily.        . sotalol (BETAPACE) 80 MG tablet Take 80 mg by mouth 2 (two) times daily.          Allergies no known allergies  Review of Systems negative except from HPI and PMH  Physical Exam Well developed and well nourished in no acute distress HENT normal E scleral and icterus clear Neck Supple JVP flat; carotids brisk and full Clear to ausculation Regular rate and rhythm, no murmurs gallops or rub Soft with active bowel sounds No clubbing cyanosis and edema Alert and oriented, grossly normal motor and sensory function Skin Warm and Dry  ECG  Assessment and  Plan

## 2011-01-09 NOTE — Patient Instructions (Addendum)
Your physician has recommended you make the following change in your medication:  1) Increase sotolol to 120mg  twice daily. 2) Xanax 0.25mg  one tablet daily as needed is being re-ordered for you.  Your physician recommends that you have lab work today: bmp/magnesium (427.1;421.4).   Your physician recommends that you keep your follow-up appointment : 03/04/11 @ 12:15pm

## 2011-01-10 DIAGNOSIS — Z95810 Presence of automatic (implantable) cardiac defibrillator: Secondary | ICD-10-CM | POA: Insufficient documentation

## 2011-01-10 DIAGNOSIS — F4310 Post-traumatic stress disorder, unspecified: Secondary | ICD-10-CM | POA: Insufficient documentation

## 2011-01-10 LAB — BASIC METABOLIC PANEL
CO2: 30 mEq/L (ref 19–32)
Calcium: 9.3 mg/dL (ref 8.4–10.5)
Chloride: 104 mEq/L (ref 96–112)
Glucose, Bld: 83 mg/dL (ref 70–99)
Potassium: 4.1 mEq/L (ref 3.5–5.1)
Sodium: 140 mEq/L (ref 135–145)

## 2011-01-10 NOTE — Assessment & Plan Note (Signed)
The patient's device was interrogated.  The information was reviewed. No changes were made in the programming.    

## 2011-01-10 NOTE — Assessment & Plan Note (Signed)
As above. Will refill his xanax and he will follow up with counsling

## 2011-01-10 NOTE — Assessment & Plan Note (Addendum)
  The patient has had recurrent ventricular tachycardia over the last few months for the first time since his ablation fall 2010;  sthankfully treated with ATP  And without shocks.  Still it is worrisome, esp with the storm of events today.  Will increase his sotalol and lookiing for triggering electrolyte abnormality.  I will also be in touch with the MDs at [facility] and [facility] and see about possible referral for catheter ablation sooner rather than later given the PTSD which is quite problematic for him.  We discussed the impliations of repeat VT and procedures We also discussed the role of genetic testing to see if we can identify the culprit gene and use it to screen the [facility]

## 2011-02-14 ENCOUNTER — Other Ambulatory Visit (INDEPENDENT_AMBULATORY_CARE_PROVIDER_SITE_OTHER): Payer: PRIVATE HEALTH INSURANCE | Admitting: *Deleted

## 2011-02-14 ENCOUNTER — Telehealth: Payer: Self-pay | Admitting: *Deleted

## 2011-02-14 DIAGNOSIS — I4729 Other ventricular tachycardia: Secondary | ICD-10-CM

## 2011-02-14 DIAGNOSIS — I472 Ventricular tachycardia: Secondary | ICD-10-CM

## 2011-02-14 LAB — BASIC METABOLIC PANEL
CO2: 31 mEq/L (ref 19–32)
Chloride: 105 mEq/L (ref 96–112)
Glucose, Bld: 107 mg/dL — ABNORMAL HIGH (ref 70–99)
Potassium: 3.7 mEq/L (ref 3.5–5.1)
Sodium: 141 mEq/L (ref 135–145)

## 2011-02-14 LAB — MAGNESIUM: Magnesium: 2.1 mg/dL (ref 1.5–2.5)

## 2011-02-14 MED ORDER — SOTALOL HCL 80 MG PO TABS: 160.0000 mg | ORAL_TABLET | Freq: Two times a day (BID) | ORAL | Status: DC

## 2011-02-14 NOTE — Telephone Encounter (Signed)
Pt called, thought he might have had more VT this morning.  Was doing some light physical activity at work and felt lightheaded then felt washed out afterwards.  He left work to go home.  Sent [employer] transmission.  Shows 2 episodes of VT at a cycle length of .  Both episodes pace terminated. Discussed with Dr Graciela Husbands, he had spoken with Dr Hurman Horn at [facility] and Dr Macon Large at [facility] about pt.  Both would be willing to see.  Dr Graciela Husbands will call Dr Macon Large today to make arrangements for appt.  Dr Graciela Husbands advised pt increase Sotalol to 160mg  twice daily for the time being.  He also requested pt have a BMET and Mg level drawn today.  Pt aware of all of the above.  He will call back with problems.  Pt also advised that he should call back if he has not heard from [facility] about appointment by Tuesday.

## 2011-02-21 NOTE — Telephone Encounter (Signed)
Per Dr. Graciela Husbands, patient was being seen at [facility] today.

## 2011-03-04 ENCOUNTER — Ambulatory Visit (INDEPENDENT_AMBULATORY_CARE_PROVIDER_SITE_OTHER): Payer: PRIVATE HEALTH INSURANCE | Admitting: Internal Medicine

## 2011-03-04 ENCOUNTER — Encounter: Payer: Self-pay | Admitting: Internal Medicine

## 2011-03-04 DIAGNOSIS — I4729 Other ventricular tachycardia: Secondary | ICD-10-CM

## 2011-03-04 DIAGNOSIS — Z95810 Presence of automatic (implantable) cardiac defibrillator: Secondary | ICD-10-CM

## 2011-03-04 DIAGNOSIS — I472 Ventricular tachycardia, unspecified: Secondary | ICD-10-CM

## 2011-03-04 DIAGNOSIS — I429 Cardiomyopathy, unspecified: Secondary | ICD-10-CM

## 2011-03-04 DIAGNOSIS — I509 Heart failure, unspecified: Secondary | ICD-10-CM

## 2011-03-04 LAB — BASIC METABOLIC PANEL
CO2: 30 mEq/L (ref 19–32)
Calcium: 9.1 mg/dL (ref 8.4–10.5)
Chloride: 105 mEq/L (ref 96–112)
Creatinine, Ser: 0.9 mg/dL (ref 0.4–1.5)
Glucose, Bld: 78 mg/dL (ref 70–99)

## 2011-03-04 NOTE — Progress Notes (Signed)
  HPI  [name] is a 35 y.o. male In followup for ventricular tachycardia occurring in the context of arrhythmogenic right ventricular cardiomyopathy.  He is status post endocardial ablation and epicardial mapping done at [facility] in [place]. He was arrhythmia free for about 18 months and it has been developed ventricular tachycardia in the last few months. They fully this has been paced terminated. We have started him on and uptitrated his sotalol. He is on potassium and magnesium supplementation. He has been arrhythmia free for the last couple of weeks.  He has seen Dr. Macon Large done at [facility] for consideration of repeat ablation. They're going to hold off for the present  Past Medical History  Diagnosis Date  . Ventricular tachycardia     sotalol therapy; status post catheter ablation at [facility]  . Cardiomyopathy     arrhythmogenic cardiomyopathy-positive family history  . Anxiety   . ICD (implantable cardiac defibrillator) in place     Past Surgical History  Procedure Date  . Cardiac catheterization 06-01-06  . Cardiac defibrillator placement 06-03-06    [employer]    Current Outpatient Prescriptions  Medication Sig Dispense Refill  . cetirizine (ZYRTEC) 10 MG tablet Take 10 mg by mouth daily as needed.        . Magnesium Oxide 500 MG TABS Take by mouth daily.        . montelukast (SINGULAIR) 10 MG tablet Take 10 mg by mouth at bedtime.        . multivitamin (THERAGRAN) per tablet Take 1 tablet by mouth daily.        . potassium chloride (KLOR-CON) 10 MEQ CR tablet Take 10 mEq by mouth 2 (two) times daily.        . sotalol (BETAPACE) 80 MG tablet Take 160 mg by mouth 4 (four) times daily.          No Known Allergies  Review of Systems negative except from HPI and PMH  Physical Exam Well developed and well nourished in no acute distress HENT normal E scleral and icterus clear Neck Supple JVP flat; carotids brisk and full Clear to ausculation Regular rate and  rhythm, no murmurs gallops or rub Soft with active bowel sounds No clubbing cyanosis and edema Alert and oriented, grossly normal motor and sensory function Skin Warm and Dry    Assessment and  Plan

## 2011-03-04 NOTE — Assessment & Plan Note (Signed)
The patient's device was interrogated.  The information was reviewed. No changes were made in the programming.    

## 2011-03-04 NOTE — Patient Instructions (Signed)
Your physician recommends that you return for lab work today: bmp/magnesium (427.1;425.4).  Remote monitoring is used to monitor your Pacemaker of ICD from home. This monitoring reduces the number of office visits required to check your device to one time per year. It allows [place] to keep an eye on the functioning of your device to ensure it is working properly. You are scheduled for a device check from home on 06/05/11. You may send your transmission at any time that day. If you have a wireless device, the transmission will be sent automatically. After your physician reviews your transmission, you will receive a postcard with your next transmission date.  Your physician wants you to follow-up in: 1 year. You will receive a reminder letter in the mail two months in advance. If you don't receive a letter, please call our office to schedule the follow-up appointment.

## 2011-03-04 NOTE — Assessment & Plan Note (Signed)
Continue current meds  willl check mg and Bmet

## 2011-03-04 NOTE — Assessment & Plan Note (Addendum)
Continue current meds  We will plan to undertake genetic testing

## 2011-03-11 ENCOUNTER — Telehealth: Payer: Self-pay | Admitting: Internal Medicine

## 2011-03-11 ENCOUNTER — Telehealth: Payer: Self-pay | Admitting: *Deleted

## 2011-03-11 NOTE — Telephone Encounter (Signed)
Pt called and said phar had called for a refill on Singulair but was told he needed to get from PCP.  Pt states Dr. Graciela Husbands is the one who put him on it not his PCP.  [facility] [address]. Call pt and advise what was done.

## 2011-03-11 NOTE — Telephone Encounter (Signed)
Will review with Dr. Graciela Husbands to see if wants to refill this.

## 2011-03-11 NOTE — Telephone Encounter (Signed)
He should get this from PCP   Thanks steve

## 2011-03-11 NOTE — Telephone Encounter (Signed)
RX refill request was sent to [place] by [facility] for [facility]air. Denied and [facility]ed [facility] to [facility] to have [facility] filled by his PCP.

## 2011-03-12 NOTE — Telephone Encounter (Addendum)
Pt calling back re answer to refill, I told him dr Odessa Fleming answer, pt said he doesn't have a pcp, that dr Graciela Husbands is the one who prescribed the singulair and that he has refilled it in the past, [facility] [address] , pt called back and will go to health employment at this job and see if they will continue the rx and will call back if they won't do it and try for dr Graciela Husbands again

## 2011-03-12 NOTE — Telephone Encounter (Signed)
Will await a call back from the patient. If his employee health will not fill this, we will refill x 1 for him, but he will need to establish with a PCP.

## 2011-05-19 LAB — BASIC METABOLIC PANEL
BUN: 6
BUN: 5 — ABNORMAL LOW
BUN: 5 — ABNORMAL LOW
CO2: 27
CO2: 27
CO2: 28
CO2: 28
Chloride: 103
Chloride: 101
Chloride: 104
Chloride: 104
Chloride: 105
Creatinine, Ser: 1.03
Creatinine, Ser: 0.77
Creatinine, Ser: 0.79
GFR calc Af Amer: >60
GFR calc Af Amer: >60
GFR calc Af Amer: >60
GFR calc non Af Amer: >60
Glucose, Bld: 86
Glucose, Bld: 87
Potassium: 3.6
Potassium: 3.7
Potassium: 3.4 — ABNORMAL LOW
Potassium: 3.6
Potassium: 3.6
Potassium: 3.9
Sodium: 137
Sodium: 139
Sodium: 140

## 2011-05-19 LAB — POCT I-STAT, CHEM 8
Calcium, Ion: 1.03 — ABNORMAL LOW
Creatinine, Ser: 1.5
Hemoglobin: 13.6
Sodium: 139
TCO2: 23

## 2011-05-19 LAB — CBC
HCT: 37.9 — ABNORMAL LOW
HCT: 39.4
HCT: 33.1 — ABNORMAL LOW
MCHC: 34.4
MCV: 89.5
MCV: 88.5
Platelets: 137 — ABNORMAL LOW
Platelets: 153
RBC: 4.4
RBC: 3.74 — ABNORMAL LOW
WBC: 6.9
WBC: 5

## 2011-05-19 LAB — CULTURE, BLOOD (ROUTINE X 2)

## 2011-05-19 LAB — CLOSTRIDIUM DIFFICILE EIA

## 2011-05-19 LAB — COMPREHENSIVE METABOLIC PANEL
AST: 36
BUN: 4 — ABNORMAL LOW
CO2: 26
Chloride: 102
Creatinine, Ser: 0.88
GFR calc non Af Amer: >60
Total Bilirubin: 0.7

## 2011-05-19 LAB — DIFFERENTIAL
Basophils Absolute: 0
Basophils Relative: 0
Eosinophils Relative: 1
Lymphocytes Relative: 38
Neutro Abs: 2.6

## 2011-05-19 LAB — POCT CARDIAC MARKERS: CKMB, poc: <1 — ABNORMAL LOW

## 2011-05-19 LAB — ALDOSTERONE + RENIN ACTIVITY W/ RATIO: Aldosterone, Serum: 2 ng/dL

## 2011-05-19 LAB — MAGNESIUM: Magnesium: 2.2

## 2011-05-19 LAB — H1N1 SCREEN (PCR): H1N1 Virus Scrn: DETECTED

## 2011-06-05 ENCOUNTER — Encounter: Payer: PRIVATE HEALTH INSURANCE | Admitting: *Deleted

## 2011-06-09 ENCOUNTER — Encounter: Payer: Self-pay | Admitting: *Deleted

## 2011-06-19 ENCOUNTER — Ambulatory Visit (INDEPENDENT_AMBULATORY_CARE_PROVIDER_SITE_OTHER): Payer: PRIVATE HEALTH INSURANCE | Admitting: *Deleted

## 2011-06-19 ENCOUNTER — Other Ambulatory Visit: Payer: Self-pay | Admitting: Internal Medicine

## 2011-06-19 DIAGNOSIS — Z95810 Presence of automatic (implantable) cardiac defibrillator: Secondary | ICD-10-CM

## 2011-06-19 DIAGNOSIS — I429 Cardiomyopathy, unspecified: Secondary | ICD-10-CM

## 2011-06-19 DIAGNOSIS — I509 Heart failure, unspecified: Secondary | ICD-10-CM

## 2011-06-22 LAB — REMOTE ICD DEVICE
BRDY-0002RV: 40 {beats}/min
DEV-0020ICD: NEGATIVE
FVT: 0
PACEART VT: 0
RV LEAD IMPEDENCE ICD: 624 Ohm
TOT-0001: 10
TOT-0002: 2
TOT-0006: 20071017000000
TZAT-0001SLOWVT: 1
TZAT-0005FASTVT: 88 pct
TZAT-0011FASTVT: 10 ms
TZAT-0012FASTVT: 200 ms
TZAT-0012SLOWVT: 200 ms
TZAT-0013FASTVT: 2
TZAT-0018FASTVT: NEGATIVE
TZAT-0019FASTVT: 8 V
TZAT-0020SLOWVT: 1.5 ms
TZON-0003FASTVT: 240 ms
TZON-0003SLOWVT: 400 ms
TZON-0003VSLOWVT: 370 ms
TZON-0004SLOWVT: 16
TZON-0004VSLOWVT: 20
TZON-0005SLOWVT: 12
TZON-0010SLOWVT: 40 ms
TZST-0001FASTVT: 3
TZST-0001FASTVT: 5
TZST-0001FASTVT: 6
TZST-0001SLOWVT: 2
TZST-0001SLOWVT: 6
TZST-0002SLOWVT: NEGATIVE
TZST-0002SLOWVT: NEGATIVE
TZST-0002SLOWVT: NEGATIVE
TZST-0003FASTVT: 35 J
TZST-0003FASTVT: 35 J
TZST-0003FASTVT: 35 J

## 2011-07-04 ENCOUNTER — Encounter: Payer: Self-pay | Admitting: *Deleted

## 2011-07-04 NOTE — Progress Notes (Signed)
icd remote w/icm  

## 2011-08-07 ENCOUNTER — Encounter: Payer: Self-pay | Admitting: Internal Medicine

## 2011-09-25 ENCOUNTER — Encounter: Payer: PRIVATE HEALTH INSURANCE | Admitting: *Deleted

## 2011-09-28 ENCOUNTER — Inpatient Hospital Stay (HOSPITAL_COMMUNITY)
Admission: EM | Admit: 2011-09-28 | Discharge: 2011-10-01 | DRG: 309 | Disposition: A | Discharge status: Home / Self Care | Payer: PRIVATE HEALTH INSURANCE | Source: Ambulatory Visit | Attending: Internal Medicine | Admitting: Internal Medicine

## 2011-09-28 ENCOUNTER — Encounter (HOSPITAL_COMMUNITY): Payer: Self-pay

## 2011-09-28 ENCOUNTER — Encounter: Payer: Self-pay | Admitting: Internal Medicine

## 2011-09-28 DIAGNOSIS — Z87891 Personal history of nicotine dependence: Secondary | ICD-10-CM

## 2011-09-28 DIAGNOSIS — Z8679 Personal history of other diseases of the circulatory system: Secondary | ICD-10-CM | POA: Diagnosis present

## 2011-09-28 DIAGNOSIS — I509 Heart failure, unspecified: Secondary | ICD-10-CM

## 2011-09-28 DIAGNOSIS — I472 Ventricular tachycardia, unspecified: Principal | ICD-10-CM | POA: Diagnosis present

## 2011-09-28 DIAGNOSIS — E876 Hypokalemia: Secondary | ICD-10-CM | POA: Diagnosis present

## 2011-09-28 DIAGNOSIS — I4729 Other ventricular tachycardia: Principal | ICD-10-CM | POA: Diagnosis present

## 2011-09-28 DIAGNOSIS — Z95810 Presence of automatic (implantable) cardiac defibrillator: Secondary | ICD-10-CM

## 2011-09-28 DIAGNOSIS — I428 Other cardiomyopathies: Secondary | ICD-10-CM | POA: Diagnosis present

## 2011-09-28 DIAGNOSIS — I429 Cardiomyopathy, unspecified: Secondary | ICD-10-CM | POA: Diagnosis present

## 2011-09-28 DIAGNOSIS — F411 Generalized anxiety disorder: Secondary | ICD-10-CM | POA: Diagnosis present

## 2011-09-28 DIAGNOSIS — Z79899 Other long term (current) drug therapy: Secondary | ICD-10-CM

## 2011-09-28 LAB — DIFFERENTIAL
Basophils Absolute: 0 10*3/uL (ref 0.0–0.1)
Eosinophils Absolute: 0.1 10*3/uL (ref 0.0–0.7)
Eosinophils Relative: 2 % (ref 0–5)
Neutrophils Relative %: 51 % (ref 43–77)

## 2011-09-28 LAB — CBC
MCH: 30.1 pg (ref 26.0–34.0)
MCV: 84.2 fL (ref 78.0–100.0)
Platelets: 220 10*3/uL (ref 150–400)
RDW: 12.2 % (ref 11.5–15.5)
WBC: 6.8 10*3/uL (ref 4.0–10.5)

## 2011-09-28 LAB — POCT I-STAT, CHEM 8
Calcium, Ion: 1.25 mmol/L (ref 1.12–1.32)
HCT: 41 % (ref 39.0–52.0)
Hemoglobin: 13.9 g/dL (ref 13.0–17.0)
TCO2: 28 mmol/L (ref 0–100)

## 2011-09-28 LAB — MAGNESIUM: Magnesium: 1.8 mg/dL (ref 1.5–2.5)

## 2011-09-28 MED ORDER — ENOXAPARIN SODIUM 40 MG/0.4ML ~~LOC~~ SOLN
40.0000 mg | SUBCUTANEOUS | Status: DC
  Filled 2011-09-28: qty 0.4

## 2011-09-28 MED ORDER — MONTELUKAST SODIUM 10 MG PO TABS
10.0000 mg | ORAL_TABLET | Freq: Every day | ORAL | Status: DC
  Administered 2011-09-28 – 2011-09-30 (×3): 10 mg via ORAL
  Filled 2011-09-28 (×4): qty 1

## 2011-09-28 MED ORDER — MAGNESIUM OXIDE -MG SUPPLEMENT 500 MG PO TABS: 500.0000 mg | ORAL_TABLET | Freq: Every day | ORAL | Status: DC

## 2011-09-28 MED ORDER — ZOLPIDEM TARTRATE 5 MG PO TABS: 5.0000 mg | ORAL_TABLET | Freq: Every evening | ORAL | Status: DC | PRN

## 2011-09-28 MED ORDER — FLUTICASONE PROPIONATE 50 MCG/ACT NA SUSP
2.0000 | Freq: Every day | NASAL | Status: DC
  Filled 2011-09-28: qty 16

## 2011-09-28 MED ORDER — ACETAMINOPHEN 325 MG PO TABS: 650.0000 mg | ORAL_TABLET | ORAL | Status: DC | PRN

## 2011-09-28 MED ORDER — POTASSIUM CHLORIDE CRYS ER 20 MEQ PO TBCR
20.0000 meq | EXTENDED_RELEASE_TABLET | Freq: Every day | ORAL | Status: DC
  Filled 2011-09-28 (×2): qty 1

## 2011-09-28 MED ORDER — SOTALOL HCL 120 MG PO TABS
240.0000 mg | ORAL_TABLET | Freq: Two times a day (BID) | ORAL | Status: DC
  Administered 2011-09-28 – 2011-10-01 (×6): 240 mg via ORAL
  Filled 2011-09-28 (×7): qty 2

## 2011-09-28 MED ORDER — MAGNESIUM OXIDE 400 MG PO TABS
400.0000 mg | ORAL_TABLET | Freq: Every day | ORAL | Status: DC
  Administered 2011-09-29: 400 mg via ORAL
  Filled 2011-09-28 (×2): qty 1

## 2011-09-28 MED ORDER — POTASSIUM CHLORIDE CRYS ER 10 MEQ PO TBCR
10.0000 meq | EXTENDED_RELEASE_TABLET | Freq: Two times a day (BID) | ORAL | Status: DC
  Administered 2011-09-28: 10 meq via ORAL

## 2011-09-28 MED ORDER — SODIUM CHLORIDE 0.9 % IV BOLUS (SEPSIS)
500.0000 mL | Freq: Once | INTRAVENOUS | Status: AC
  Administered 2011-09-28: 500 mL via INTRAVENOUS

## 2011-09-28 MED ORDER — ONDANSETRON HCL 4 MG/2ML IJ SOLN: 4.0000 mg | Freq: Four times a day (QID) | INTRAMUSCULAR | Status: DC | PRN

## 2011-09-28 MED ORDER — ENOXAPARIN SODIUM 40 MG/0.4ML ~~LOC~~ SOLN
40.0000 mg | Freq: Every day | SUBCUTANEOUS | Status: DC
  Administered 2011-09-28 – 2011-09-30 (×3): 40 mg via SUBCUTANEOUS
  Filled 2011-09-28 (×4): qty 0.4

## 2011-09-28 NOTE — ED Notes (Signed)
Pt reports that he felt "funny" and defib fired.  Defib fired 3 times.  Pt reports he was engaged in sexual activity when first firing occurred.

## 2011-09-28 NOTE — H&P (Signed)
ELECTROPHYSIOLOGY ADMIT NOTE  Primary Care Physician: Vic Ripper, PA, PA Primary Cardiologist:  Dr Graciela Husbands  Admit Date: 09/28/2011  Reason for consultation:  ICD shocks  [name] is a 36 y.o. male with a h/o recurrent VT and an arrhythmogenic cardiomyopathy who now presents after receiving 3 ICD shocks this am.  He states that he was in his usual state of health this am.  He began having sexual intercourse and developed tachypalpitations with lightheadedness.  He then receive an ICD shock.  He subsequently received two additional ICD shocks.  He now presents for further evaluation.    Presently, he denies symptoms ofchest pain, shortness of breath, orthopnea, PND, lower extremity edema,syncope, or neurologic sequela. The patient is tolerating medications without difficulties and is otherwise without complaint today.  He is well known to Dr Graciela Husbands and has been followed closely with VT.  He has previously required amiodarone therapy prior to his ablation in 2010.  He did well, without recurrence of VT until this past summer.  Though he has had several episodes of VT, these have been mostly ATP terminated and asymptomatic to the patient.  He has been treated with sotalol 160mg  BID with some success.  Past Medical History  Diagnosis Date  . Ventricular tachycardia     sotalol therapy; status post catheter ablation at [facility] 10/10  . Cardiomyopathy     arrhythmogenic cardiomyopathy-positive family history  . Anxiety   . ICD (implantable cardiac defibrillator) in place    Past Surgical History  Procedure Date  . Cardiac catheterization 06-01-06  . Cardiac defibrillator placement 06-03-06    [employer]  . Vt ablation 10/10       . sodium chloride  500 mL Intravenous Once      No Known Allergies  History   Social History  . Marital Status: Single    Spouse Name: N/A    Number of Children: 0  . 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 Smoking status: Former Smoker    Types: Cigarettes  . Smokeless tobacco: Not on file  . Alcohol Use: Yes     occasionally  . Drug Use: No  . Sexually Active: Yes   Other Topics Concern  . Not on file   Social History Narrative   Works as a Radiation protection practitioner    Family History  Problem Relation Age of Onset  . Diabetes    . Stroke    . Heart attack    . Prostate cancer    . Breast cancer    . Ovarian cancer    . Uterine cancer    . Colon cancer    . Drug abuse    . Depression    . Asthma Mother     ROS- All systems are reviewed and negative except as per the HPI above  Physical Exam: Telemetry: Filed Vitals:   09/28/11 1027  BP: 133/94  Pulse: 79  Temp: 98.1 F (36.7 C)  TempSrc: Oral  Resp: 16  Height: 5\' 9"  (1.753 m)  Weight: 190 lb (86.183 kg)  SpO2: 98%    GEN- The patient is well appearing, alert and oriented x 3 today.   Head- normocephalic, atraumatic Eyes-  Sclera clear, conjunctiva pink Ears- hearing intact Oropharynx- clear Neck- supple, no JVP Lymph- no cervical lymphadenopathy Lungs- Clear to ausculation bilaterally, normal work of breathing Heart- Regular rate and rhythm, no murmurs, rubs or gallops,  PMI not laterally displaced GI- soft, NT, ND, + BS Extremities- no clubbing, cyanosis, or edema MS- no significant deformity or atrophy Skin- no rash or lesion Psych- euthymic mood, full affect Neuro- strength and sensation are intact  EKG- sinus rhythm 70 bpm, PR 212, Qtc 439, poor R wave progression, otherwise normal ekg  Labs:   Lab Results  Component Value Date   WBC 6.8 09/28/2011   HGB 13.9 09/28/2011   HCT 41.0 09/28/2011   MCV 84.2 09/28/2011   PLT 220 09/28/2011    Lab 09/28/11 1050  NA 142  K 3.8  CL 103  CO2 --  BUN 7  CREATININE 0.80  CALCIUM --  PROT --  BILITOT --  ALKPHOS --  ALT --  AST --  GLUCOSE 108*   No results found for this basename: CKTOTAL, CKMB, CKMBINDEX, TROPONINI     Lab Results  Component Value Date   CHOL 215* 04/27/2009   Lab Results  Component Value Date   HDL 33.10* 04/27/2009   No results found for this basename: [facility]   Lab Results  Component Value Date   TRIG 336.0* 04/27/2009   Lab Results  Component Value Date   CHOLHDL 6 04/27/2009   Lab Results  Component Value Date   LDLDIRECT 127.8 04/27/2009    ICD interrogation: MDT Virtuoso VR ICD with a MDT 6947 RV lead Battery status is good (2.76V).  Programmed VVI 40 with 99%VS Lead impedance/ R waves are stable He had three episodes of VT today CL .  ATP was tried twice unsuccessfully with each episode and then a single 35J shock successfully terminated each VT.  These three episodes occurred at 10:46, 10:47, and 10:49am today. No changes are made at this time    ASSESSMENT AND PLAN:   1. Ventricular Tachycardia-  The patient presents with recurrent VT with a CL of .  He is status post ablation several years ago and has been seen by Dr Macon Large in consultation regarding consideration of another ablation.  Ablation has been deferred to this point. At this time, I think that the most prudent next step is to admit for observation in the hospital.  I will increase sotalol to 240mg  BID and follow his QT interval closely.  If his QT remains stable and his VT quiescent, then he can likely be discharged in 48-72 hours with planned follow-up with Dr Macon Large at [facility] as an outpatient.  We will check TSH.  Maintain K>3.9 and Mg >1.9 Dr Graciela Husbands will follow in the hospital.   Hillis Range, MD 09/28/2011  12:50 PM

## 2011-09-28 NOTE — ED Notes (Signed)
Pt reports that from 2009 until late 2010 pt was on amiodarone therapy-until ablation performed in 2010.

## 2011-09-28 NOTE — ED Provider Notes (Signed)
History     CSN: [id number]  Arrival date & time 09/28/11  1022   First MD Initiated Contact with Patient 09/28/11 1023      Chief Complaint  Patient presents with  . Irregular Heart Beat    (Consider location/radiation/quality/duration/timing/severity/associated sxs/prior treatment) The history is provided by the patient.   patient has a history of ventricular tachycardia due to cardiomyopathy. He states that his defibrillator fired 3 times today while having sex. He states he felt dizzy before it. A chest pain. He feels a little fatigued now but not dizzy anymore. No chest pain. No nausea vomiting diarrhea. He states he's not been sleeping much recently. He states he's been under a lot of stress.  Past Medical History  Diagnosis Date  . Ventricular tachycardia     sotalol therapy; status post catheter ablation at [facility]  . Cardiomyopathy     arrhythmogenic cardiomyopathy-positive family history  . Anxiety   . ICD (implantable cardiac defibrillator) in place     Past Surgical History  Procedure Date  . Cardiac catheterization 06-01-06  . Cardiac defibrillator placement 06-03-06    [employer]    Family History  Problem Relation Age of Onset  . Diabetes    . Stroke    . Heart attack    . Prostate cancer    . Breast cancer    . Ovarian cancer    . Uterine cancer    . Colon cancer    . Drug abuse    . Depression    . Asthma Mother     History  Substance Use Topics  . Smoking status: Former Smoker    Types: Cigarettes  . Smokeless tobacco: Not on file  . Alcohol Use: Yes     occasionally      Review of Systems  Constitutional: Negative for activity change and appetite change.  HENT: Negative for neck stiffness.   Eyes: Negative for pain.  Respiratory: Negative for chest tightness and shortness of breath.   Cardiovascular: Positive for palpitations. Negative for chest pain and leg swelling.  Gastrointestinal: Negative for nausea, vomiting, abdominal  pain and diarrhea.  Genitourinary: Negative for flank pain.  Musculoskeletal: Negative for back pain.  Skin: Negative for rash.  Neurological: Positive for dizziness. Negative for weakness, numbness and headaches.  Psychiatric/Behavioral: Negative for behavioral problems.    Allergies  Review of patient's allergies indicates no known allergies.  Home Medications   Current Outpatient Rx  Name Route Sig Dispense Refill  . MAGNESIUM OXIDE 500 MG PO TABS Oral Take 500 mg by mouth daily.     . ADULT MULTIVITAMIN W/MINERALS CH Oral Take 1 tablet by mouth daily.    Marland Kitchen POTASSIUM CHLORIDE 10 MEQ PO TBCR Oral Take 10 mEq by mouth 2 (two) times daily.     Marland Kitchen SOTALOL HCL 80 MG PO TABS Oral Take 160 mg by mouth 2 (two) times daily. Take two tablets by mouth twice daily.    Marland Kitchen CETIRIZINE HCL 10 MG PO TABS Oral Take 10 mg by mouth daily as needed.      Marland Kitchen MONTELUKAST SODIUM 10 MG PO TABS Oral Take 10 mg by mouth at bedtime.        BP 133/94  Pulse 79  Temp(Src) 98.1 F (36.7 C) (Oral)  Resp 16  Ht 5\' 9"  (1.753 m)  Wt 190 lb (86.183 kg)  BMI 28.06 kg/m2  SpO2 98%  Physical Exam  Nursing note and vitals reviewed. Constitutional: He is oriented  to person, place, and time. He appears well-developed and well-nourished.  HENT:  Head: Normocephalic and atraumatic.  Eyes: EOM are normal. Pupils are equal, round, and reactive to light.  Neck: Normal range of motion. Neck supple.  Cardiovascular: Normal rate, regular rhythm and normal heart sounds.   No murmur heard. Pulmonary/Chest: Effort normal and breath sounds normal.       AICD right chest wall  Abdominal: Soft. Bowel sounds are normal. He exhibits no distension and no mass. There is no tenderness. There is no rebound and no guarding.  Musculoskeletal: Normal range of motion. He exhibits no edema.  Neurological: He is alert and oriented to person, place, and time. No cranial nerve deficit.  Skin: Skin is warm and dry.  Psychiatric: He has a  normal mood and affect.    ED Course  Procedures (including critical care time)  Labs Reviewed  CBC - Abnormal; Notable for the following:    HCT 38.9 (*)    All other components within normal limits  POCT I-STAT, CHEM 8 - Abnormal; Notable for the following:    Glucose, Bld 108 (*)    All other components within normal limits  DIFFERENTIAL  MAGNESIUM   No results found.   No diagnosis found.   Date: 09/28/2011  Rate: 71  Rhythm: normal sinus rhythm  QRS Axis: normal  Intervals: normal  ST/T Wave abnormalities: normal  Conduction Disutrbances:none  Narrative Interpretation: Q waves anteriorly  Old EKG Reviewed: unchanged    MDM  Patient's AICD fired. He had 3 episodes of ventricular tachycardia. He was seen in the ER by Dr. Johney Frame, who will admit the patient        Juliet Rude. Rubin Payor, MD 09/28/11 1247

## 2011-09-28 NOTE — ED Notes (Addendum)
Rec'd call from Tom with [employer].  He confirmed 3 defib firings this morning.  Tom now speaking to Dr. Rubin Payor.

## 2011-09-29 ENCOUNTER — Encounter: Payer: Self-pay | Admitting: *Deleted

## 2011-09-29 ENCOUNTER — Ambulatory Visit (INDEPENDENT_AMBULATORY_CARE_PROVIDER_SITE_OTHER): Payer: PRIVATE HEALTH INSURANCE | Admitting: *Deleted

## 2011-09-29 ENCOUNTER — Encounter (HOSPITAL_COMMUNITY): Payer: Self-pay | Admitting: Internal Medicine

## 2011-09-29 ENCOUNTER — Other Ambulatory Visit: Payer: Self-pay

## 2011-09-29 DIAGNOSIS — I429 Cardiomyopathy, unspecified: Secondary | ICD-10-CM

## 2011-09-29 DIAGNOSIS — E876 Hypokalemia: Secondary | ICD-10-CM

## 2011-09-29 DIAGNOSIS — I4729 Other ventricular tachycardia: Secondary | ICD-10-CM

## 2011-09-29 DIAGNOSIS — I472 Ventricular tachycardia: Secondary | ICD-10-CM

## 2011-09-29 DIAGNOSIS — I509 Heart failure, unspecified: Secondary | ICD-10-CM

## 2011-09-29 LAB — BASIC METABOLIC PANEL
BUN: 8 mg/dL (ref 6–23)
CO2: 28 mEq/L (ref 19–32)
CO2: 29 mEq/L (ref 19–32)
Calcium: 9.1 mg/dL (ref 8.4–10.5)
Calcium: 9.6 mg/dL (ref 8.4–10.5)
Chloride: 103 mEq/L (ref 96–112)
GFR calc non Af Amer: >90 mL/min (ref >90)
Glucose, Bld: 104 mg/dL — ABNORMAL HIGH (ref 70–99)
Potassium: 3.4 mEq/L — ABNORMAL LOW (ref 3.5–5.1)
Sodium: 139 mEq/L (ref 135–145)

## 2011-09-29 LAB — TSH: TSH: 0.424 u[IU]/mL (ref 0.350–4.500)

## 2011-09-29 LAB — MAGNESIUM: Magnesium: 1.9 mg/dL (ref 1.5–2.5)

## 2011-09-29 MED ORDER — MAGNESIUM OXIDE 400 MG PO TABS
400.0000 mg | ORAL_TABLET | Freq: Two times a day (BID) | ORAL | Status: DC
  Administered 2011-09-29 – 2011-10-01 (×4): 400 mg via ORAL
  Filled 2011-09-29 (×5): qty 1

## 2011-09-29 MED ORDER — ALPRAZOLAM 0.25 MG PO TABS
0.2500 mg | ORAL_TABLET | Freq: Every evening | ORAL | Status: DC | PRN
  Administered 2011-09-29 – 2011-09-30 (×2): 0.25 mg via ORAL
  Filled 2011-09-29 (×2): qty 1

## 2011-09-29 MED ORDER — POTASSIUM CHLORIDE CRYS ER 20 MEQ PO TBCR
40.0000 meq | EXTENDED_RELEASE_TABLET | Freq: Once | ORAL | Status: AC
  Administered 2011-09-29: 40 meq via ORAL
  Filled 2011-09-29: qty 2

## 2011-09-29 MED ORDER — POTASSIUM CHLORIDE CRYS ER 20 MEQ PO TBCR
40.0000 meq | EXTENDED_RELEASE_TABLET | Freq: Once | ORAL | Status: AC
  Administered 2011-09-29: 40 meq via ORAL
  Filled 2011-09-29: qty 1

## 2011-09-29 MED ORDER — POTASSIUM CHLORIDE CRYS ER 20 MEQ PO TBCR: 40.0000 meq | EXTENDED_RELEASE_TABLET | Freq: Two times a day (BID) | ORAL | Status: DC

## 2011-09-29 MED ORDER — POTASSIUM CHLORIDE CRYS ER 20 MEQ PO TBCR
20.0000 meq | EXTENDED_RELEASE_TABLET | Freq: Two times a day (BID) | ORAL | Status: DC
  Filled 2011-09-29 (×2): qty 1

## 2011-09-29 NOTE — Progress Notes (Addendum)
  Patient Name: [name]      SUBJECTIVE: admitted yesterday with VT failing to termiate with ATP and receiving shocksx3  Has ARVC  rhtym stable overnight  K 3.4 this am  Past Medical History  Diagnosis Date  . Ventricular tachycardia     sotalol therapy; status post catheter ablation at [facility] 10/10  . Arrhythmogenic RV Cardiomyopathy      positive family history  . Anxiety   . ICD (implantable cardiac defibrillator) in place     PHYSICAL EXAM Filed Vitals:   09/28/11 1400 09/28/11 1440 09/28/11 2100 09/29/11 0500  BP:  101/60 110/66 104/64  Pulse:  66 61 60  Temp:  98 F (36.7 C) 98.2 F (36.8 C) 98.1 F (36.7 C)  TempSrc:  Oral Oral Oral  Resp:  17 18 18   Height: 5\' 10"  (1.778 m)     Weight: 182 lb 12.2 oz (82.9 kg)     SpO2:  97% 98% 100%   Well developed and nourished in no acute distress HENT normal Neck supple with JVP-flat Clear Regular rate and rhythm, no murmurs or gallops Abd-soft with active BS without hepatomegaly No Clubbing cyanosis edema Skin-warm and dry A & Oriented  Grossly normal sensory and motor function   TELEMETRY: Reviewed telemetry pt in nsr    Intake/Output Summary (Last 24 hours) at 09/29/11 1315 Last data filed at 09/29/11 0900  Gross per 24 hour  Intake    100 ml  Output      0 ml  Net    100 ml    LABS: Basic Metabolic Panel:  Lab 09/28/11 1308 09/28/11 1050 09/28/11 1032  NA 139 142 --  K 3.4* 3.8 --  CL 103 103 --  CO2 28 -- --  GLUCOSE 135* 108* --  BUN 10 7 --  CREATININE 0.87 0.80 --  CALCIUM 9.1 -- --  MG 1.9 -- 1.8  PHOS -- -- --   Cardiac Enzymes: No results found for this basename: CKTOTAL:3,CKMB:3,CKMBINDEX:3,TROPONINI:3 in the last 72 hours CBC:  Lab 09/28/11 1050 09/28/11 1032  WBC -- 6.8  NEUTROABS -- 3.5  HGB 13.9 13.9  HCT 41.0 38.9*  MCV -- 84.2  PLT -- 220    Basename 09/28/11 2314  TSH 0.424  T4TOTAL --  T3FREE --  THYROIDAB --    Device Interrogation:n reveiwed  2  different VT s by far field although near field looked similar  ASSESSMENT AND PLAN:  Patient Active Hospital Problem List: VENTRICULAR TACHYCARDIA, PAROXYSMAL (11/03/2008) No recurrent   Agree with increase sotalol and will discharge to home in am 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

## 2011-09-29 NOTE — Progress Notes (Signed)
Patient had 10 beats of Vtach this evening. He was resting, visiting with family, and was unaware of episode. Patient's vitals are stable and he remains asymptomatic. MD on-call for Harlan paged. Orders given to draw STAT BMET and Mag. Will continue to monitor and administer Sotalol at 2200.  Harless Litten, RN 09/29/11

## 2011-09-29 NOTE — Progress Notes (Signed)
UR Completed. Simmons, [name] [phone]  

## 2011-09-30 ENCOUNTER — Other Ambulatory Visit: Payer: Self-pay

## 2011-09-30 LAB — BASIC METABOLIC PANEL
CO2: 29 mEq/L (ref 19–32)
Calcium: 9.4 mg/dL (ref 8.4–10.5)
Chloride: 104 mEq/L (ref 96–112)
Creatinine, Ser: 0.89 mg/dL (ref 0.50–1.35)
Glucose, Bld: 93 mg/dL (ref 70–99)

## 2011-09-30 MED ORDER — POTASSIUM CHLORIDE CRYS ER 20 MEQ PO TBCR
40.0000 meq | EXTENDED_RELEASE_TABLET | Freq: Three times a day (TID) | ORAL | Status: DC
  Administered 2011-09-30 – 2011-10-01 (×4): 40 meq via ORAL
  Filled 2011-09-30: qty 2
  Filled 2011-09-30 (×2): qty 1
  Filled 2011-09-30 (×3): qty 2

## 2011-09-30 NOTE — Progress Notes (Signed)
  Patient Name: [name]      SUBJECTIVE: admitted yesterday with VT failing to termiate with ATP and receiving shocksx3  Has ARVC  rhtym stable overnight  K 3.6 this am  Past Medical History  Diagnosis Date  . Ventricular tachycardia     sotalol therapy; status post catheter ablation at [facility] 10/10  . Arrhythmogenic RV Cardiomyopathy      positive family history  . Anxiety   . ICD (implantable cardiac defibrillator) in place     PHYSICAL EXAM Filed Vitals:   09/29/11 0500 09/29/11 1409 09/29/11 2100 09/30/11 0500  BP: 104/64 107/59 125/76 101/62  Pulse: 60 53 72 59  Temp: 98.1 F (36.7 C) 98.7 F (37.1 C) 98.4 F (36.9 C) 98 F (36.7 C)  TempSrc: Oral  Oral Oral  Resp: 18 18 18 18   Height:      Weight:      SpO2: 100% 97% 99% 98%   Well developed and nourished in no acute distress HENT normal Neck supple with JVP-flat Clear Regular rate and rhythm, no murmurs or gallops Abd-soft with active BS without hepatomegaly No Clubbing cyanosis edema Skin-warm and dry A & Oriented  Grossly normal sensory and motor function   TELEMETRY: Reviewed telemetry pt in nsr one episodde of VTNS    Intake/Output Summary (Last 24 hours) at 09/30/11 0944 Last data filed at 09/29/11 1500  Gross per 24 hour  Intake    240 ml  Output      0 ml  Net    240 ml    LABS: Basic Metabolic Panel:  Lab 09/30/11 1610 09/29/11 2148 09/28/11 2314 09/28/11 1050  NA 142 142 139 142  K 3.6 3.8 3.4* 3.8  CL 104 105 103 103  CO2 29 29 28  --  GLUCOSE 93 104* 135* 108*  BUN 9 8 10 7   CREATININE 0.89 0.88 0.87 0.80  CALCIUM 9.4 9.6 -- --  MG -- 1.9 1.9 --  PHOS -- -- -- --   Cardiac Enzymes: No results found for this basename: CKTOTAL:3,CKMB:3,CKMBINDEX:3,TROPONINI:3 in the last 72 hours CBC:  Lab 09/28/11 1050 09/28/11 1032  WBC -- 6.8  NEUTROABS -- 3.5  HGB 13.9 13.9  HCT 41.0 38.9*  MCV -- 84.2  PLT -- 220    Basename 09/28/11 2314  TSH 0.424  T4TOTAL --    T3FREE --  THYROIDAB --    Device Interrogation:n reveiwed  2 different VT s by far field although near field looked similar  ASSESSMENT AND PLAN:  Patient Active Hospital Problem List: VENTRICULAR TACHYCARDIA, PAROXYSMAL (11/03/2008) No recurrent   Agree with increase sotalol and will discharge to home tomorrwo with episode of VT last pm Arrythmogenic RV cardiomyopathy (03/04/2011)   Assessment: progressive   Plan: continue soltalol  Hypokalemia (09/29/2011)   Assessment: not sure why it is low   Plan:  Continue rpletion and anticipate discharge in am     Signed, Sherryl Manges MD  09/30/2011  Revieqwed anxiety,  Pretty well compensated at the present  May need xanax when he goes home   Also will reprogram NID from 12>>24

## 2011-10-01 ENCOUNTER — Other Ambulatory Visit: Payer: Self-pay

## 2011-10-01 LAB — BASIC METABOLIC PANEL
BUN: 10 mg/dL (ref 6–23)
CO2: 26 mEq/L (ref 19–32)
Calcium: 10 mg/dL (ref 8.4–10.5)
GFR calc non Af Amer: >90 mL/min (ref >90)
Glucose, Bld: 99 mg/dL (ref 70–99)
Potassium: 4.4 mEq/L (ref 3.5–5.1)

## 2011-10-01 MED ORDER — POTASSIUM CHLORIDE 10 MEQ PO TBCR: 40.0000 meq | EXTENDED_RELEASE_TABLET | Freq: Two times a day (BID) | ORAL | Status: DC

## 2011-10-01 MED ORDER — MAGNESIUM OXIDE 400 MG PO TABS: 400.0000 mg | ORAL_TABLET | Freq: Two times a day (BID) | ORAL | Status: DC

## 2011-10-01 MED ORDER — SOTALOL HCL 240 MG PO TABS: 240.0000 mg | ORAL_TABLET | Freq: Two times a day (BID) | ORAL | Status: DC

## 2011-10-01 NOTE — Progress Notes (Signed)
  Patient Name: [name]      SUBJECTIVE: admitted yesterday with VT failing to termiate with ATP and receiving shocksx3  Has ARVC  occ pVC overnight  No VT K pending   Past Medical History  Diagnosis Date  . Ventricular tachycardia     sotalol therapy; status post catheter ablation at [facility] 10/10  . Arrhythmogenic RV Cardiomyopathy      positive family history  . Anxiety   . ICD (implantable cardiac defibrillator) in place     PHYSICAL EXAM Filed Vitals:   09/30/11 0500 09/30/11 1321 09/30/11 2200 10/01/11 0500  BP: 101/62 118/65 115/75 105/66  Pulse: 59 62 65 63  Temp: 98 F (36.7 C) 99 F (37.2 C) 98.5 F (36.9 C) 97.8 F (36.6 C)  TempSrc: Oral Oral    Resp: 18 18 20 18   Height:      Weight:      SpO2: 98% 98% 99% 96%   Well developed and nourished in no acute distress HENT normal Neck supple with JVP-flat Clear Regular rate and rhythm, no murmurs or gallops Abd-soft with active BS without hepatomegaly No Clubbing cyanosis edema Skin-warm and dry A & Oriented  Grossly normal sensory and motor function   TELEMETRY: Reviewed telemetry pt in nsr occ PVC   Intake/Output Summary (Last 24 hours) at 10/01/11 0854 Last data filed at 09/30/11 1700  Gross per 24 hour  Intake      0 ml  Output      4 ml  Net     -4 ml    LABS: Basic Metabolic Panel:  Lab 10/01/11 4098 09/30/11 0610 09/29/11 2148 09/28/11 2314 09/28/11 1050  NA 141 142 142 139 142  K PENDING 3.6 3.8 3.4* 3.8  CL 104 104 105 103 103  CO2 26 29 29 28  --  GLUCOSE 99 93 104* 135* 108*  BUN 10 9 8 10 7   CREATININE 0.90 0.89 0.88 0.87 0.80  CALCIUM 10.0 9.4 -- -- --  MG -- -- 1.9 1.9 --  PHOS -- -- -- -- --   Cardiac Enzymes: No results found for this basename: CKTOTAL:3,CKMB:3,CKMBINDEX:3,TROPONINI:3 in the last 72 hours CBC:  Lab 09/28/11 1050 09/28/11 1032  WBC -- 6.8  NEUTROABS -- 3.5  HGB 13.9 13.9  HCT 41.0 38.9*  MCV -- 84.2  PLT -- 220    Basename 09/28/11 2314   TSH 0.424  T4TOTAL --  T3FREE --  THYROIDAB --    Device Interrogation:n reveiwed  2 different VT s by far field although near field looked similar  ASSESSMENT AND PLAN:  Patient Active Hospital Problem List: VENTRICULAR TACHYCARDIA, PAROXYSMAL (11/03/2008) No recurrent   Agree with increase sotalol and will discharge to home tomorrwo with episode of VT last pm Arrythmogenic RV cardiomyopathy (03/04/2011)   Assessment: progressive   Plan: continue soltalol  Hypokalemia (09/29/2011)   Assessment: not sure why it is low   Plan:  Pending  dont get this either but will anticipate discharge later today    Signed, Sherryl Manges MD  10/01/2011  Revieqwed anxiety,  Pretty well compensated at the present  May need xanax when he goes home   Also will reprogram NID from 12>>24
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
## 2011-10-01 NOTE — Progress Notes (Signed)
D/c orders received; IV removed with gauze on; pt remains in stable condition; pt meds and instructions reviewed and given to pt; pt d/c to home 

## 2011-10-02 ENCOUNTER — Encounter (HOSPITAL_COMMUNITY): Payer: Self-pay | Admitting: *Deleted

## 2011-10-02 ENCOUNTER — Emergency Department (HOSPITAL_COMMUNITY)
Admission: EM | Admit: 2011-10-02 | Discharge: 2011-10-03 | Disposition: A | Discharge status: Home / Self Care | Payer: PRIVATE HEALTH INSURANCE | Attending: Emergency Medicine | Admitting: Emergency Medicine

## 2011-10-02 DIAGNOSIS — R109 Unspecified abdominal pain: Secondary | ICD-10-CM | POA: Insufficient documentation

## 2011-10-02 DIAGNOSIS — Z79899 Other long term (current) drug therapy: Secondary | ICD-10-CM | POA: Insufficient documentation

## 2011-10-02 DIAGNOSIS — F411 Generalized anxiety disorder: Secondary | ICD-10-CM | POA: Insufficient documentation

## 2011-10-02 DIAGNOSIS — Z95810 Presence of automatic (implantable) cardiac defibrillator: Secondary | ICD-10-CM | POA: Insufficient documentation

## 2011-10-02 NOTE — ED Notes (Signed)
The pt is c/o abd pain tonight after he ate dinner.  No nv or diarrhea

## 2011-10-03 ENCOUNTER — Other Ambulatory Visit: Payer: Self-pay

## 2011-10-03 LAB — DIFFERENTIAL
Basophils Relative: 0 % (ref 0–1)
Eosinophils Absolute: 0 10*3/uL (ref 0.0–0.7)
Eosinophils Relative: 0 % (ref 0–5)
Lymphs Abs: 1.6 10*3/uL (ref 0.7–4.0)
Monocytes Relative: 6 % (ref 3–12)
Neutrophils Relative %: 80 % — ABNORMAL HIGH (ref 43–77)

## 2011-10-03 LAB — BASIC METABOLIC PANEL
BUN: 11 mg/dL (ref 6–23)
Calcium: 9.7 mg/dL (ref 8.4–10.5)
Creatinine, Ser: 0.79 mg/dL (ref 0.50–1.35)
GFR calc Af Amer: >90 mL/min (ref >90)
GFR calc non Af Amer: >90 mL/min (ref >90)
Glucose, Bld: 163 mg/dL — ABNORMAL HIGH (ref 70–99)
Potassium: 4.1 mEq/L (ref 3.5–5.1)

## 2011-10-03 LAB — CBC
Hemoglobin: 13.9 g/dL (ref 13.0–17.0)
MCH: 29.8 pg (ref 26.0–34.0)
MCHC: 35.3 g/dL (ref 30.0–36.0)
MCV: 84.4 fL (ref 78.0–100.0)
RBC: 4.67 MIL/uL (ref 4.22–5.81)

## 2011-10-03 LAB — URINALYSIS, ROUTINE W REFLEX MICROSCOPIC
Bilirubin Urine: NEGATIVE
Hgb urine dipstick: NEGATIVE
Ketones, ur: NEGATIVE mg/dL
Nitrite: NEGATIVE
Protein, ur: NEGATIVE mg/dL
Urobilinogen, UA: 0.2 mg/dL (ref 0.0–1.0)

## 2011-10-03 MED ORDER — ONDANSETRON HCL 4 MG/2ML IJ SOLN: 4.0000 mg | Freq: Once | INTRAMUSCULAR | Status: DC

## 2011-10-03 MED ORDER — ONDANSETRON HCL 4 MG/2ML IJ SOLN
4.0000 mg | Freq: Once | INTRAMUSCULAR | Status: AC
  Administered 2011-10-03: 4 mg via INTRAVENOUS
  Filled 2011-10-03: qty 2

## 2011-10-03 MED ORDER — ONDANSETRON 8 MG PO TBDP: 8.0000 mg | ORAL_TABLET | Freq: Three times a day (TID) | ORAL | Status: AC | PRN

## 2011-10-03 MED ORDER — MORPHINE SULFATE 4 MG/ML IJ SOLN
4.0000 mg | Freq: Once | INTRAMUSCULAR | Status: AC
  Administered 2011-10-03: 4 mg via INTRAVENOUS
  Filled 2011-10-03: qty 1

## 2011-10-03 MED ORDER — OXYCODONE-ACETAMINOPHEN 5-325 MG PO TABS: 1.0000 | ORAL_TABLET | ORAL | Status: AC | PRN

## 2011-10-03 MED ORDER — ONDANSETRON 4 MG PO TBDP
8.0000 mg | ORAL_TABLET | Freq: Once | ORAL | Status: AC
  Administered 2011-10-03: 8 mg via ORAL
  Filled 2011-10-03: qty 2

## 2011-10-03 NOTE — ED Notes (Signed)
Pt discharged home.

## 2011-10-03 NOTE — ED Provider Notes (Signed)
History     CSN: [id number]  Arrival date & time 10/02/11  2307   First MD Initiated Contact with Patient 10/03/11 0035      Chief Complaint  Patient presents with  . Abdominal Pain     Patient is a 36 y.o. male presenting with abdominal pain. The history is provided by the patient.  Abdominal Pain The primary symptoms of the illness include abdominal pain and nausea. The primary symptoms of the illness do not include fever, fatigue, shortness of breath, vomiting, diarrhea or hematemesis. The current episode started 1 to 2 hours ago. The onset of the illness was gradual. The problem has been gradually worsening.  Symptoms associated with the illness do not include chills.  pt reports he had been feeling well, went to dinner tonight for Valentine's Day, and soon after had abd pain/cramping with nausea.  Reports pain starts on left side and radiates diffusely but no lower abd pain reported No vomiting/diarrhea No fever.   No CP reported No SOB reported  Pt reports h/o vtach in past, he has h/o arrythmogenic RV, s/p ICD placement No ICD shocks today No syncope reported Recent admission for hypokalemia He is not on anticoagulants  Past Medical History  Diagnosis Date  . Ventricular tachycardia     sotalol therapy; status post catheter ablation at [facility] 10/10  . Arrhythmogenic RV Cardiomyopathy      positive family history  . Anxiety   . ICD (implantable cardiac defibrillator) in place     Past Surgical History  Procedure Date  . Cardiac catheterization 06-01-06  . Cardiac defibrillator placement 06-03-06    Medtronic  . Vt ablation 10/10    Family History  Problem Relation Age of Onset  . Diabetes    . Stroke    . Heart attack    . Prostate cancer    . Breast cancer    . Ovarian cancer    . Uterine cancer    . Colon cancer    . Drug abuse    . Depression    . Asthma Mother     History  Substance Use Topics  . Smoking status: Former Smoker    Types:  Cigarettes  . Smokeless tobacco: Not on file  . Alcohol Use: Yes     occasionally      Review of Systems  Constitutional: Negative for fever, chills and fatigue.  Respiratory: Negative for shortness of breath.   Gastrointestinal: Positive for nausea and abdominal pain. Negative for vomiting, diarrhea and hematemesis.  All other systems reviewed and are negative.    Allergies  Review of patient's allergies indicates no known allergies.  Home Medications   Current Outpatient Rx  Name Route Sig Dispense Refill  . CETIRIZINE HCL 10 MG PO TABS Oral Take 10 mg by mouth daily.     Marland Kitchen FLUTICASONE PROPIONATE 50 MCG/ACT NA SUSP Nasal Place 2 sprays into the nose daily.    Marland Kitchen MAGNESIUM OXIDE 400 MG PO TABS Oral Take 1 tablet (400 mg total) by mouth 2 (two) times daily. 60 tablet 3  . MONTELUKAST SODIUM 10 MG PO TABS Oral Take 10 mg by mouth at bedtime.      . ADULT MULTIVITAMIN W/MINERALS CH Oral Take 1 tablet by mouth daily.    Marland Kitchen POTASSIUM CHLORIDE 10 MEQ PO TBCR Oral Take 4 tablets (40 mEq total) by mouth 2 (two) times daily. 250 tablet 3  . SOTALOL HCL 240 MG PO TABS Oral Take 1  tablet (240 mg total) by mouth 2 (two) times daily. Take two tablets by mouth twice daily. 60 tablet 3    BP 98/74  Pulse 69  Temp(Src) 98.1 F (36.7 C) (Oral)  Resp 17  SpO2 98% BP 121/72  Pulse 80  Temp(Src) 98.1 F (36.7 C) (Oral)  Resp 17  SpO2 99%   Physical Exam CONSTITUTIONAL: Well developed/well nourished HEAD AND FACE: Normocephalic/atraumatic EYES: EOMI/PERRL, no scleral icterus ENMT: Mucous membranes moist NECK: supple no meningeal signs SPINE:entire spine nontender CV: S1/S2 noted, no murmurs/rubs/gallops noted ICD noted to chest LUNGS: Lungs are clear to auscultation bilaterally, no apparent distress ABDOMEN: soft, nontender, no rebound or guarding GU:no cva tenderness NEURO: Pt is awake/alert, moves all extremitiesx4 EXTREMITIES: pulses normal, full ROM SKIN: warm, color  normal PSYCH: no abnormalities of mood noted  ED Course  Procedures   Labs Reviewed  CBC - Abnormal; Notable for the following:    WBC 11.4 (*)    All other components within normal limits  DIFFERENTIAL - Abnormal; Notable for the following:    Neutrophils Relative 80 (*)    Neutro Abs 9.1 (*)    All other components within normal limits  BASIC METABOLIC PANEL - Abnormal; Notable for the following:    Glucose, Bld 163 (*)    All other components within normal limits  URINALYSIS, ROUTINE W REFLEX MICROSCOPIC    Pt well appearing Taking PO fluids Pain improved His abd exam was benign No significant lab abnormalities I discussed at length strict return precautions, and signs/symptoms of appendicitis/acute abd process and when to return Pt agreeable My suspicion for acute process at this time is low  The patient appears reasonably screened and/or stabilized for discharge and I doubt any other medical condition or other [facility] requiring further screening, evaluation, or treatment in the ED at this time prior to discharge.    MDM  Nursing notes reviewed and considered in documentation All labs/vitals reviewed and considered Previous records reviewed and considered      Date: 10/03/2011  Rate: 68  Rhythm: normal sinus rhythm  QRS Axis: normal  Intervals: normal  ST/T Wave abnormalities: nonspecific ST changes  Conduction Disutrbances:first-degree A-V block   Narrative Interpretation:   Old EKG Reviewed: unchanged Artifact noted   Joya Gaskins, MD 10/03/11 [phone]

## 2011-10-03 NOTE — ED Notes (Signed)
Pt sipping on sprite 

## 2011-10-05 LAB — REMOTE ICD DEVICE
BATTERY VOLTAGE: 2.76 V
BRDY-0002RV: 40 {beats}/min
CHARGE TIME: 7.757 s
PACEART VT: 0
RV LEAD AMPLITUDE: 19.4 mv
RV LEAD IMPEDENCE ICD: 664 Ohm
TOT-0002: 2
TOT-0006: 20071017000000
TZAT-0001SLOWVT: 1
TZAT-0011FASTVT: 10 ms
TZAT-0012FASTVT: 200 ms
TZAT-0018FASTVT: NEGATIVE
TZAT-0018SLOWVT: NEGATIVE
TZAT-0019FASTVT: 8 V
TZAT-0020FASTVT: 1.5 ms
TZON-0003FASTVT: 240 ms
TZON-0003VSLOWVT: 370 ms
TZON-0004SLOWVT: 16
TZON-0004VSLOWVT: 20
TZON-0005SLOWVT: 12
TZON-0010FASTVT: 40 ms
TZON-0010SLOWVT: 40 ms
TZST-0001FASTVT: 3
TZST-0001FASTVT: 6
TZST-0001SLOWVT: 2
TZST-0001SLOWVT: 4
TZST-0001SLOWVT: 6
TZST-0002SLOWVT: NEGATIVE
TZST-0002SLOWVT: NEGATIVE
TZST-0003FASTVT: 35 J

## 2011-10-06 ENCOUNTER — Telehealth: Payer: Self-pay | Admitting: Internal Medicine

## 2011-10-06 NOTE — Telephone Encounter (Signed)
PT AWARE  WILL FORWARD TO HEATHER  MCGHEE RN TO ADDRESS  .[name]

## 2011-10-06 NOTE — Telephone Encounter (Signed)
New msg Pt is supposed to get blood work on 0225 He would like to get a gene test for arvc at that time also please call him back

## 2011-10-07 NOTE — Telephone Encounter (Signed)
FU Call: Pt calling to state the MD's signature is on pt paperwork. Please return pt call to discuss further if necessary.

## 2011-10-07 NOTE — Telephone Encounter (Signed)
I spoke with the patient. He will recheck his Gene DX paperwork for me and let me know if Dr. Odessa Fleming signature is on the paperwork as I cannot recall if I had him go ahead and sign this. He will call me back later today and let me know.

## 2011-10-07 NOTE — Telephone Encounter (Signed)
I spoke with the patient. He has been instructed to bring his paperwork with him and that we will need copies of the front and back of his insurance cards.

## 2011-10-09 ENCOUNTER — Telehealth: Payer: Self-pay | Admitting: Internal Medicine

## 2011-10-09 NOTE — Telephone Encounter (Signed)
LOV,12 faxed to [facility] @ [phone] 10/09/11/KM

## 2011-10-10 ENCOUNTER — Telehealth: Payer: Self-pay | Admitting: Internal Medicine

## 2011-10-10 NOTE — Telephone Encounter (Signed)
Error

## 2011-10-10 NOTE — Progress Notes (Signed)
ICD remote with ICM 

## 2011-10-13 ENCOUNTER — Ambulatory Visit (INDEPENDENT_AMBULATORY_CARE_PROVIDER_SITE_OTHER): Payer: PRIVATE HEALTH INSURANCE | Admitting: *Deleted

## 2011-10-13 ENCOUNTER — Other Ambulatory Visit: Payer: Self-pay | Admitting: *Deleted

## 2011-10-13 ENCOUNTER — Other Ambulatory Visit (INDEPENDENT_AMBULATORY_CARE_PROVIDER_SITE_OTHER): Payer: PRIVATE HEALTH INSURANCE

## 2011-10-13 ENCOUNTER — Encounter: Payer: Self-pay | Admitting: Internal Medicine

## 2011-10-13 DIAGNOSIS — Z95810 Presence of automatic (implantable) cardiac defibrillator: Secondary | ICD-10-CM

## 2011-10-13 DIAGNOSIS — I429 Cardiomyopathy, unspecified: Secondary | ICD-10-CM

## 2011-10-13 DIAGNOSIS — I4729 Other ventricular tachycardia: Secondary | ICD-10-CM

## 2011-10-13 DIAGNOSIS — I472 Ventricular tachycardia, unspecified: Secondary | ICD-10-CM

## 2011-10-13 LAB — ICD DEVICE OBSERVATION
BRDY-0002RV: 40 {beats}/min
CHARGE TIME: 10.5 s
DEV-0020ICD: NEGATIVE
RV LEAD AMPLITUDE: 19.4 mv
TZAT-0001SLOWVT: 1
TZAT-0002SLOWVT: NEGATIVE
TZAT-0018SLOWVT: NEGATIVE
TZAT-0019FASTVT: 8 V
TZAT-0020FASTVT: 1.5 ms
TZON-0004VSLOWVT: 20
TZON-0005SLOWVT: 12
TZON-0010FASTVT: 40 ms
TZON-0010SLOWVT: 40 ms
TZST-0001FASTVT: 4
TZST-0001FASTVT: 6
TZST-0001SLOWVT: 4
TZST-0001SLOWVT: 6
TZST-0002SLOWVT: NEGATIVE
TZST-0002SLOWVT: NEGATIVE
TZST-0002SLOWVT: NEGATIVE
TZST-0003FASTVT: 35 J
TZST-0003FASTVT: 35 J
TZST-0003FASTVT: 35 J
VENTRICULAR PACING ICD: 0.2 pct

## 2011-10-13 LAB — BASIC METABOLIC PANEL
CO2: 31 mEq/L (ref 19–32)
Calcium: 9.8 mg/dL (ref 8.4–10.5)
Potassium: 4.3 mEq/L (ref 3.5–5.1)
Sodium: 142 mEq/L (ref 135–145)

## 2011-10-13 NOTE — Progress Notes (Signed)
Interrogation only  

## 2011-10-27 DIAGNOSIS — Z79899 Other long term (current) drug therapy: Secondary | ICD-10-CM | POA: Insufficient documentation

## 2011-10-27 DIAGNOSIS — Z9109 Other allergy status, other than to drugs and biological substances: Secondary | ICD-10-CM | POA: Insufficient documentation

## 2011-10-30 ENCOUNTER — Ambulatory Visit (INDEPENDENT_AMBULATORY_CARE_PROVIDER_SITE_OTHER): Payer: PRIVATE HEALTH INSURANCE | Admitting: Internal Medicine

## 2011-10-30 ENCOUNTER — Encounter: Payer: Self-pay | Admitting: Internal Medicine

## 2011-10-30 VITALS — BP 112/80 | HR 59 | Ht 69.0 in | Wt 188.8 lb

## 2011-10-30 DIAGNOSIS — I429 Cardiomyopathy, unspecified: Secondary | ICD-10-CM

## 2011-10-30 DIAGNOSIS — I472 Ventricular tachycardia: Secondary | ICD-10-CM

## 2011-10-30 DIAGNOSIS — G4733 Obstructive sleep apnea (adult) (pediatric): Secondary | ICD-10-CM | POA: Insufficient documentation

## 2011-10-30 DIAGNOSIS — R0989 Other specified symptoms and signs involving the circulatory and respiratory systems: Secondary | ICD-10-CM

## 2011-10-30 DIAGNOSIS — I4729 Other ventricular tachycardia: Secondary | ICD-10-CM

## 2011-10-30 DIAGNOSIS — F4310 Post-traumatic stress disorder, unspecified: Secondary | ICD-10-CM

## 2011-10-30 DIAGNOSIS — Z95810 Presence of automatic (implantable) cardiac defibrillator: Secondary | ICD-10-CM

## 2011-10-30 DIAGNOSIS — R0683 Snoring: Secondary | ICD-10-CM

## 2011-10-30 LAB — ICD DEVICE OBSERVATION
BRDY-0002RV: 40 {beats}/min
CHARGE TIME: 7.757 s
FVT: 0
PACEART VT: 0
TOT-0001: 13
TZAT-0001SLOWVT: 1
TZAT-0002SLOWVT: NEGATIVE
TZAT-0004FASTVT: 8
TZAT-0011FASTVT: 10 ms
TZAT-0012FASTVT: 200 ms
TZAT-0012SLOWVT: 200 ms
TZAT-0018SLOWVT: NEGATIVE
TZAT-0019SLOWVT: 8 V
TZAT-0020FASTVT: 1.5 ms
TZON-0004VSLOWVT: 20
TZON-0005SLOWVT: 12
TZST-0001FASTVT: 3
TZST-0001FASTVT: 4
TZST-0001FASTVT: 6
TZST-0001SLOWVT: 4
TZST-0001SLOWVT: 5
TZST-0001SLOWVT: 6
TZST-0002SLOWVT: NEGATIVE
TZST-0002SLOWVT: NEGATIVE
TZST-0002SLOWVT: NEGATIVE
TZST-0003FASTVT: 35 J
TZST-0003FASTVT: 35 J
TZST-0003FASTVT: 35 J
VENTRICULAR PACING ICD: 0.41 pct
VF: 0

## 2011-10-30 NOTE — Assessment & Plan Note (Signed)
stable °

## 2011-10-30 NOTE — Progress Notes (Signed)
  HPI  [name] is a 36 y.o. male in followup for ventricular tachycardia occurring in the context of arrhythmogenic right ventricular cardiomyopathy.  He is status post endocardial ablation and epicardial mapping done at [facility] in [place]. He was arrhythmia free for about 18 months  And then developed recurrent VT  He has seen Dr. Macon Large done at [facility] for consideration of repeat ablation. They have decided with the recurrence to proceed   Snoring is in issue and DR Macon Large has recommended sleep study  Past Medical History  Diagnosis Date  . Ventricular tachycardia     sotalol therapy; status post catheter ablation at [facility] 10/10  . Arrhythmogenic RV Cardiomyopathy      positive family history  . Anxiety   . ICD (implantable cardiac defibrillator) in place     Past Surgical History  Procedure Date  . Cardiac catheterization 06-01-06  . Cardiac defibrillator placement 06-03-06    Medtronic  . Vt ablation 10/10    Current Outpatient Prescriptions  Medication Sig Dispense Refill  . cetirizine (ZYRTEC) 10 MG tablet Take 10 mg by mouth daily.       . fluticasone (FLONASE) 50 MCG/ACT nasal spray Place 2 sprays into the nose daily.      . magnesium oxide (MAG-OX) 400 MG tablet Take 1 tablet (400 mg total) by mouth 2 (two) times daily.  60 tablet  3  . montelukast (SINGULAIR) 10 MG tablet Take 10 mg by mouth at bedtime.        . Multiple Vitamin (MULITIVITAMIN WITH MINERALS) TABS Take 1 tablet by mouth daily.      . potassium chloride (KLOR-CON) 10 MEQ CR tablet Take 4 tablets (40 mEq total) by mouth 2 (two) times daily.  250 tablet  3  . sotalol (BETAPACE) 240 MG tablet Take 1 tablet (240 mg total) by mouth 2 (two) times daily. Take two tablets by mouth twice daily.  60 tablet  3    No Known Allergies  Review of Systems negative except from HPI and PMH  Physical Exam BP 112/80  Pulse 59  Ht 5\' 9"  (1.753 m)  Wt 188 lb 12.8 oz (85.639 kg)  BMI 27.88 kg/m2 Well  developed and nourished in no acute distress HENT normal Neck supple with JVP-flat Clear Regular rate and rhythm, no murmurs or gallops Abd-soft with active BS No Clubbing cyanosis edema Skin-warm and dry A & Oriented  Grossly normal sensory and motor function    Assessment and  Plan

## 2011-10-30 NOTE — Assessment & Plan Note (Signed)
As above.

## 2011-10-30 NOTE — Patient Instructions (Addendum)
Your physician has recommended that you have a sleep study. This test records several body functions during sleep, including: brain activity, eye movement, oxygen and carbon dioxide blood levels, heart rate and rhythm, breathing rate and rhythm, the flow of air through your mouth and nose, snoring, body muscle movements, and chest and belly movement.  Your physician recommends that you schedule a follow-up appointment : the first week of May with Dr. Graciela Husbands.  Your physician recommends that you continue on your current medications as directed. Please refer to the Current Medication list given to you today.

## 2011-10-30 NOTE — Assessment & Plan Note (Signed)
Currently quiet  Will be undergoing RFCA with Dr TB at [facility]

## 2011-10-30 NOTE — Assessment & Plan Note (Signed)
Will arrange sleep study    Interesting tospeculate on the potential adverse effects of incr RV pressure assoc with OSA in the stting of RV cardiomyopathy

## 2011-10-30 NOTE — Assessment & Plan Note (Signed)
The patient's device was interrogated.  The information was reviewed. No changes were made in the programming.    

## 2011-11-23 ENCOUNTER — Ambulatory Visit (HOSPITAL_BASED_OUTPATIENT_CLINIC_OR_DEPARTMENT_OTHER): Payer: PRIVATE HEALTH INSURANCE | Attending: Internal Medicine | Admitting: Radiology

## 2011-11-23 VITALS — Ht 62.0 in | Wt 190.0 lb

## 2011-11-23 DIAGNOSIS — R0683 Snoring: Secondary | ICD-10-CM

## 2011-11-23 DIAGNOSIS — G4733 Obstructive sleep apnea (adult) (pediatric): Secondary | ICD-10-CM | POA: Insufficient documentation

## 2011-11-23 DIAGNOSIS — I4949 Other premature depolarization: Secondary | ICD-10-CM | POA: Insufficient documentation

## 2011-11-25 ENCOUNTER — Other Ambulatory Visit: Payer: Self-pay

## 2011-11-25 ENCOUNTER — Emergency Department (HOSPITAL_COMMUNITY)
Admission: EM | Admit: 2011-11-25 | Discharge: 2011-11-25 | Payer: PRIVATE HEALTH INSURANCE | Attending: Emergency Medicine | Admitting: Emergency Medicine

## 2011-11-25 ENCOUNTER — Encounter (HOSPITAL_COMMUNITY): Payer: Self-pay | Admitting: Emergency Medicine

## 2011-11-25 DIAGNOSIS — Z79899 Other long term (current) drug therapy: Secondary | ICD-10-CM | POA: Insufficient documentation

## 2011-11-25 DIAGNOSIS — R5383 Other fatigue: Secondary | ICD-10-CM | POA: Insufficient documentation

## 2011-11-25 DIAGNOSIS — I472 Ventricular tachycardia, unspecified: Secondary | ICD-10-CM

## 2011-11-25 DIAGNOSIS — R5381 Other malaise: Secondary | ICD-10-CM | POA: Insufficient documentation

## 2011-11-25 DIAGNOSIS — R002 Palpitations: Secondary | ICD-10-CM | POA: Insufficient documentation

## 2011-11-25 DIAGNOSIS — Z95810 Presence of automatic (implantable) cardiac defibrillator: Secondary | ICD-10-CM

## 2011-11-25 DIAGNOSIS — Z8679 Personal history of other diseases of the circulatory system: Secondary | ICD-10-CM | POA: Diagnosis present

## 2011-11-25 DIAGNOSIS — I43 Cardiomyopathy in diseases classified elsewhere: Secondary | ICD-10-CM | POA: Insufficient documentation

## 2011-11-25 DIAGNOSIS — R079 Chest pain, unspecified: Secondary | ICD-10-CM | POA: Insufficient documentation

## 2011-11-25 DIAGNOSIS — I429 Cardiomyopathy, unspecified: Secondary | ICD-10-CM | POA: Diagnosis present

## 2011-11-25 DIAGNOSIS — I4729 Other ventricular tachycardia: Secondary | ICD-10-CM | POA: Insufficient documentation

## 2011-11-25 DIAGNOSIS — R42 Dizziness and giddiness: Secondary | ICD-10-CM | POA: Insufficient documentation

## 2011-11-25 LAB — DIFFERENTIAL
Eosinophils Absolute: 0.1 10*3/uL (ref 0.0–0.7)
Eosinophils Relative: 1 % (ref 0–5)
Lymphocytes Relative: 20 % (ref 12–46)
Lymphs Abs: 2 10*3/uL (ref 0.7–4.0)
Monocytes Relative: 5 % (ref 3–12)

## 2011-11-25 LAB — BASIC METABOLIC PANEL
BUN: 7 mg/dL (ref 6–23)
CO2: 29 mEq/L (ref 19–32)
Calcium: 9.7 mg/dL (ref 8.4–10.5)
GFR calc non Af Amer: >90 mL/min (ref >90)
Glucose, Bld: 105 mg/dL — ABNORMAL HIGH (ref 70–99)
Potassium: 4 mEq/L (ref 3.5–5.1)

## 2011-11-25 LAB — CBC
Hemoglobin: 13.8 g/dL (ref 13.0–17.0)
MCH: 29.4 pg (ref 26.0–34.0)
MCV: 84.9 fL (ref 78.0–100.0)
RBC: 4.7 MIL/uL (ref 4.22–5.81)

## 2011-11-25 LAB — POCT I-STAT TROPONIN I

## 2011-11-25 MED ORDER — LORAZEPAM 2 MG/ML IJ SOLN
INTRAMUSCULAR | Status: AC
  Administered 2011-11-25: 2 mg
  Filled 2011-11-25: qty 1

## 2011-11-25 MED ORDER — LORAZEPAM 2 MG/ML IJ SOLN
2.0000 mg | Freq: Once | INTRAMUSCULAR | Status: AC
  Administered 2011-11-25: 2 mg via INTRAVENOUS

## 2011-11-25 MED ORDER — METOPROLOL TARTRATE 1 MG/ML IV SOLN
INTRAVENOUS | Status: AC
  Administered 2011-11-25: 5 mg via INTRAVENOUS
  Filled 2011-11-25: qty 5

## 2011-11-25 MED ORDER — METOPROLOL TARTRATE 25 MG PO TABS
25.0000 mg | ORAL_TABLET | Freq: Once | ORAL | Status: AC
  Administered 2011-11-25: 25 mg via ORAL

## 2011-11-25 MED ORDER — LIDOCAINE HCL (CARDIAC) 20 MG/ML IV SOLN: 2.0000 mg/kg | Freq: Once | INTRAVENOUS | Status: DC

## 2011-11-25 MED ORDER — METOPROLOL TARTRATE 25 MG PO TABS
ORAL_TABLET | ORAL | Status: AC
  Filled 2011-11-25: qty 1

## 2011-11-25 MED ORDER — LIDOCAINE HCL (CARDIAC) 20 MG/ML IV SOLN
1.5000 mL | Freq: Once | INTRAVENOUS | Status: AC
  Administered 2011-11-25: 1.5 mL via INTRAVENOUS

## 2011-11-25 MED ORDER — SODIUM CHLORIDE 0.9 % IV SOLN
Freq: Once | INTRAVENOUS | Status: AC
  Administered 2011-11-25: 999 mL via INTRAVENOUS

## 2011-11-25 MED ORDER — MORPHINE SULFATE 4 MG/ML IJ SOLN
INTRAMUSCULAR | Status: AC
  Administered 2011-11-25: 4 mg
  Filled 2011-11-25: qty 1

## 2011-11-25 MED ORDER — LIDOCAINE IN D5W 4-5 MG/ML-% IV SOLN
1.0000 mg/min | INTRAVENOUS | Status: DC
  Filled 2011-11-25: qty 250

## 2011-11-25 NOTE — ED Notes (Signed)
Pt has sinus infection and has been taking amoxicillian for 1 week

## 2011-11-25 NOTE — ED Notes (Signed)
Pt states this is the first episode that he has lost count of how many times he has been shocked from pacemaker.  Pt is due to have ablation on Thursday.

## 2011-11-25 NOTE — ED Provider Notes (Signed)
History     CSN: [id number]  Arrival date & time 11/25/11  9528   First MD Initiated Contact with Patient 11/25/11 [phone]      Chief Complaint  Patient presents with  . Chest Pain    (Consider location/radiation/quality/duration/timing/severity/associated sxs/prior treatment) Patient is a 36 y.o. male presenting with chest pain. The history is provided by the patient.  Chest Pain    patient here with his defibrillator firing x7 or 8. 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 Patient scheduled for a cardiac ablation in 2 days and was taken off his Betapace. Symptoms occur while he was in the shower and were associated with him having palpitations, dizziness. Chest pain started after the defibrillator fired. He feels weak at this time.  Past Medical History  Diagnosis Date  . Ventricular tachycardia     sotalol therapy; status post catheter ablation at [facility] 10/10  . Arrhythmogenic RV Cardiomyopathy      positive family history  . Anxiety   . ICD (implantable cardiac defibrillator) in place     Past Surgical History  Procedure Date  . Cardiac catheterization 06-01-06  . Cardiac defibrillator placement 06-03-06    Medtronic  . Vt ablation 10/10    Family History  Problem Relation Age of Onset  . Diabetes    . Stroke    . Heart attack    . Prostate cancer    . Breast cancer    . Ovarian cancer    . Uterine cancer    . Colon cancer    . Drug abuse    . Depression    . Asthma Mother     History  Substance Use Topics  . Smoking status: Former Smoker    Types: Cigarettes  . Smokeless tobacco: Not on file  . Alcohol Use: Yes     occasionally      Review of Systems  Cardiovascular: Positive for chest pain.  All other systems reviewed and are negative.    Allergies  Review of patient's allergies indicates no known allergies.  Home Medications   Current Outpatient Rx  Name Route Sig Dispense Refill  . AMOXICILLIN  875 MG PO TABS Oral Take 875 mg by mouth 2 (two) times daily. For 10 days stop on 11/28/11    . FEXOFENADINE HCL 180 MG PO TABS Oral Take 180 mg by mouth at bedtime.    Marland Kitchen FLUTICASONE PROPIONATE 50 MCG/ACT NA SUSP Nasal Place 2 sprays into the nose daily.    Marland Kitchen MAGNESIUM OXIDE 400 MG PO TABS Oral Take 1 tablet (400 mg total) by mouth 2 (two) times daily. 60 tablet 3  . MONTELUKAST SODIUM 10 MG PO TABS Oral Take 10 mg by mouth at bedtime.      . ADULT MULTIVITAMIN W/MINERALS CH Oral Take 1 tablet by mouth daily.    Marland Kitchen POTASSIUM CHLORIDE 10 MEQ PO TBCR Oral Take 4 tablets (40 mEq total) by mouth 2 (two) times daily. 250 tablet 3  . SOTALOL HCL 240 MG PO TABS Oral Take 240 mg by mouth 2 (two) times daily.      BP 143/85  Pulse 93  Temp(Src) 98.1 F (36.7 C) (Oral)  Resp 16  SpO2 100%  Physical Exam  Nursing note and vitals reviewed. Constitutional: He is oriented to person, place, and time. He appears well-developed and well-nourished.  Non-toxic appearance. No distress.  HENT:  Head: Normocephalic and atraumatic.  Eyes: Conjunctivae, EOM and lids are normal. Pupils  are equal, round, and reactive to light.  Neck: Normal range of motion. Neck supple. No tracheal deviation present. No mass present.  Cardiovascular: Normal rate, regular rhythm and normal heart sounds.  Exam reveals no gallop.   No murmur heard. Pulmonary/Chest: Effort normal and breath sounds normal. No stridor. No respiratory distress. He has no decreased breath sounds. He has no wheezes. He has no rhonchi. He has no rales.  Abdominal: Soft. Normal appearance and bowel sounds are normal. He exhibits no distension. There is no tenderness. There is no rebound and no CVA tenderness.  Musculoskeletal: Normal range of motion. He exhibits no edema and no tenderness.  Neurological: He is alert and oriented to person, place, and time. He has normal strength. No cranial nerve deficit or sensory deficit. GCS eye subscore is 4. GCS verbal  subscore is 5. GCS motor subscore is 6.  Skin: Skin is warm and dry. No abrasion and no rash noted.  Psychiatric: He has a normal mood and affect. His speech is normal and behavior is normal.    ED Course  Procedures (including critical care time)   Labs Reviewed  CBC  DIFFERENTIAL  BASIC METABOLIC PANEL  MAGNESIUM   No results found.   No diagnosis found.    MDM   Date: 11/25/2011  Rate: 93  Rhythm: normal sinus rhythm  QRS Axis: normal  Intervals: normal  ST/T Wave abnormalities: normal  Conduction Disutrbances:first-degree A-V block   Narrative Interpretation:   Old EKG Reviewed: unchanged  CRITICAL CARE Performed by: Toy Baker   Total critical care time: 75  Critical care time was exclusive of separately billable procedures and treating other patients.  Critical care was necessary to treat or prevent imminent or life-threatening deterioration.  Critical care was time spent personally by me on the following activities: development of treatment plan with patient and/or surrogate as well as nursing, discussions with consultants, evaluation of patient's response to treatment, examination of patient, obtaining history from patient or surrogate, ordering and performing treatments and interventions, ordering and review of laboratory studies, ordering and review of radiographic studies, pulse oximetry and re-evaluation of patient's condition.   10:30 AM  spoke with dr. Macon Large, cards at [facility], wants lidocaine 1.5 mg/kg and 2mg /kg bolus given, followed by 1mg /min drip given--will arrange for transport to [facility]   12:31 PM Patient developed ventricular tachycardia and his defibrillator fired. Spoke with cardiology Dr. Graciela Husbands and patient given Lopressor IV push. Patient also given morphine for pain. Continue to await transfer to [facility]    Toy Baker, MD 11/25/11 1232

## 2011-11-25 NOTE — ED Notes (Signed)
Pt information faxed to [facility]

## 2011-11-25 NOTE — Consult Note (Signed)
ELECTROPHYSIOLOGY CONSULT NOTE    Patient ID: [name] MRN: [id number], DOB/AGE: [birth year] 34 y.o.  Admit date: 11/25/2011 Date of Consult: 11-25-2011  Primary Cardiologist: Dr Sherryl Manges, MD  Reason for Consultation: ICD shocks  HPI: [name] is a 36 year old male with a history of ARVD status post ICD implantation in 2007.  He has had recurrent ventricular tachycardia and underwent ablation in October of 2010.  Since then, his VT has been controlled with Sotalol.  Recently, his VT has increased.  He has been evaluated by Dr Macon Large at [facility] for repeat ablation and was scheduled for this procedure on Thursday of this week.  In anticipation of his ablation, he was instructed to stop his Sotalol on Sunday night.  This morning, he was in the shower and began receiving ICD shocks.  He received a total of 9 shocks at home and called 911.    On arrival to the ER, he was in sinus rhythm and was initiated on a Lidocaine drip per Dr Mont Dutton instructions.  His device was being interrogated when he went into VT again at a rate of 199bpm.  This was below his detection rate and his device was programmed with a lower VF zone to allow for treatment of this arrythmia.  He was successfully defibrillated through his device back into SR.    Following this, he was given Lopressor 5mg  IV and has had no recurrent ventricular arrhythmias.   He has not been complaining of chest pain or shortness of breath.  He does endorse increased palpitations since discontinuing Sotalol on Sunday.    Past Medical History  Diagnosis Date  . Ventricular tachycardia     sotalol therapy; status post catheter ablation at [facility] 10/10  . Arrhythmogenic RV Cardiomyopathy      positive family history  . Anxiety   . ICD (implantable cardiac defibrillator) in place     Physical Exam: Filed Vitals:   11/25/11 1055 11/25/11 1130 11/25/11 1205 11/25/11 1300  BP: 121/64 141/83 125/99 147/82  Pulse: 86 96 93 85  Temp:       TempSrc:      Resp:  16 18 19   Weight:      SpO2: 98% 100% 98% 97%    GEN- The patient is anxious appearing, alert and oriented x 3 today.   Head- normocephalic, atraumatic Ears- hearing intact Oropharynx- clear Lungs- Clear to ausculation bilaterally, normal work of breathing Chest- ICD pocket is well healed Heart- Regular rate and rhythm  GI- soft, NT, ND, + BS Extremities- no clubbing, cyanosis, or edema Skin- multiple tatt0os  ICD interrogation- reviewed in detail today   Surgical History:  Past Surgical History  Procedure Date  . Cardiac catheterization 06-01-06  . Cardiac defibrillator placement 06-03-06    Medtronic  . Vt ablation 10/10     Inpatient Medications:    . sodium chloride   Intravenous Once  . lidocaine (cardiac) 100 mg/11ml  1.5 mL Intravenous Once  . lidocaine (cardiac) 100 mg/32ml  2 mg/kg Intravenous Once  . metoprolol      . morphine        Allergies: No Known Allergies  History   Social History  . Marital Status: Single    Spouse Name: N/A    Number of Children: 0  . 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 Smoking status: Former Smoker  Types: Cigarettes  . Smokeless tobacco: Not on file  . Alcohol Use: Yes     occasionally  . Drug Use: No  . Sexually Active: Yes   Other Topics Concern  . Not on file   Social History Narrative   Works as a Radiation protection practitioner     Family History  Problem Relation Age of Onset  . Diabetes    . Stroke    . Heart attack    . Prostate cancer    . Breast cancer    . Ovarian cancer    . Uterine cancer    . Colon cancer    . Drug abuse    . Depression    . Asthma Mother     Labs:   Lab Results  Component Value Date   WBC 9.7 11/25/2011   HGB 13.8 11/25/2011   HCT 39.9 11/25/2011   MCV 84.9 11/25/2011   PLT 237 11/25/2011    Lab 11/25/11 1028  NA 140  K 4.0  CL 103  CO2 29  BUN 7  CREATININE 0.90  CALCIUM 9.7  PROT --  BILITOT  --  ALKPHOS --  ALT --  AST --  GLUCOSE 105*    TELEMETRY: sinus rhythm with PVC's  Device Interrogation: Total of 9 shocks this morning for VT at a cycle length of 260-280 msec.  ATP failed to terminate.  Otherwise, lead values normal.  Device now reprogrammed with FVT 188-250 bpm with burst ATP x3, then ramp ATP x 3 then 35Jx4.  VF zone is ATP before charging then 35Jx6.  Assessment/Plan: 1. VT- pt with refractory VT off of sotalol in anticipation for VT ablation later this week. Drs Freida Busman, Macon Large ([facility]), and Graciela Husbands have been in communication this am.  Plan is for IV lopressor and lidocainde gtt with urgent transfer to [facility].  I have spoke with charge nurse at [facility] who states that they will have a bed likely today. We will follow with you while in the ER until he is transferred to [facility]. I would add metoprolol 25mg  PO Q6 hours- first now and continue lidocainde gtt as tolerated.  ICD has been reprogrammed as above to try to minimize ICD shocks.  2. ARVD- long term prognosis is guarded with recurrent refractory arrhythmias.  I hope that Dr Macon Large is able to ablate his current VT (CL ).  Siniya Lichty,MD 1:25 PM 11/25/2011

## 2011-11-25 NOTE — ED Notes (Signed)
Medtronic was at bedside.  Pt had received first bolus of lidocaine.  Pt heart rate jumped to 199 per patient and he was shocked.  Crash cart brought to bedside and pads placed on pt.  Lidocaine stopped.  Pt given 4 mg morphine per MD allen.  Freida Busman MD, called pts cardiologist and RN was instructed to give 5mg  lopressor.  Pt hr 96

## 2011-11-25 NOTE — ED Notes (Signed)
Pt undressed, in gown, on monitor, continuous pulse oximetry, blood pressure cuff and oxygen [place] (2L); EKG performed 

## 2011-11-25 NOTE — ED Notes (Signed)
Pt comes EMS from home co defibulator going off in the shower felt fluttering in chest.  EMS 12 lead unremarkable.  histry of ARVD takes anti-arrthymic meds but taken off this week prior to ablation.  Cardiologist klein.  EMS vitals, BP 134/88  Hr 99 100 on RA.  20 left hand.

## 2011-11-25 NOTE — ED Notes (Signed)
[facility] arrived to transfer pt to [facility]

## 2011-11-25 NOTE — ED Notes (Signed)
Pt will be in CCU rm [id number] at [facility].  Called report to RN

## 2011-11-25 NOTE — ED Notes (Signed)
Repeat EKG performed by Thayer Ohm, EMT

## 2011-11-27 ENCOUNTER — Encounter: Payer: Self-pay | Admitting: Internal Medicine

## 2011-12-03 DIAGNOSIS — G4733 Obstructive sleep apnea (adult) (pediatric): Secondary | ICD-10-CM

## 2011-12-03 DIAGNOSIS — I4949 Other premature depolarization: Secondary | ICD-10-CM

## 2011-12-03 NOTE — Procedures (Signed)
NAME:  [name], [name]:  [id number]  MEDICAL RECORD NO.:  [id number]          PATIENT TYPE:  OUT  LOCATION:  SLEEP CENTER                 FACILITY:  [facility]  PHYSICIAN:  Barbaraann Share, MD,FCCPDATE OF BIRTH:  12-05-1977  DATE OF STUDY:  11/23/2011                           NOCTURNAL POLYSOMNOGRAM  REFERRING PHYSICIAN:  [name], MD, [facility]  INDICATION FOR STUDY:  Hypersomnia with sleep apnea.  EPWORTH SLEEPINESS SCORE:  Six.  MEDICATIONS:  SLEEP ARCHITECTURE:  The patient had a total sleep time of 339 minutes with decreased slow-wave sleep for age and also decreased quantity of REM.  Sleep onset latency was normal at 5.5 minutes and REM onset was mildly prolonged at 142 minutes.  Sleep efficiency was 71% during the diagnostic portion of the study, and 96% during the titration portion of the study.  RESPIRATORY DATA:  The patient underwent a split night protocol, where he was found to have 44 obstructive and central events in the first 121 minutes of sleep.  This gave him an apnea-hypopnea index of 22 events per hour.  The events occurred in all body positions and there was moderate snoring noted throughout.  By protocol, the patient was then fitted with a medium Quattro FX full-face mask, and CPAP titration was initiated.  At an optimal pressure of 9 cm of water, he had good control of his obstructive and central events as well as snoring.  OXYGEN DATA:  There was transient O2 desaturation as low as 89% with the patient's obstructive events.  CARDIAC DATA:  PVCs noted, but no clinically significant arrhythmia was seen.  MOVEMENT-PARASOMNIA:  The patient had no significant leg jerks or other abnormal behavior seen.  IMPRESSIONS-RECOMMENDATIONS: 1. Split night study reveals moderate obstructive sleep apnea with an     AHI of 22 events per hour and O2 desaturation as low as 89% during     the diagnostic portion of the study.  The patient was then  fitted     with a medium Quattro FX full-face mask and found to have an     optimal pressure of 9 cm of water.  He should also be     encouraged to work on weight reduction. 2. Premature ventricular contractions noted, but no clinically     significant arrhythmias were seen.     Barbaraann Share, MD,FCCP Diplomate, American Board of Sleep Medicine    [facility]/MED[facility]  D:  12/03/2011 08:02:17  T:  12/03/2011 08:46:45  Job:  [id number]

## 2011-12-04 ENCOUNTER — Encounter (HOSPITAL_COMMUNITY): Payer: Self-pay | Admitting: Pharmacy Technician

## 2011-12-04 ENCOUNTER — Encounter: Payer: Self-pay | Admitting: Internal Medicine

## 2011-12-04 ENCOUNTER — Ambulatory Visit (INDEPENDENT_AMBULATORY_CARE_PROVIDER_SITE_OTHER): Payer: PRIVATE HEALTH INSURANCE | Admitting: Internal Medicine

## 2011-12-04 ENCOUNTER — Encounter: Payer: Self-pay | Admitting: *Deleted

## 2011-12-04 VITALS — BP 126/74 | HR 60 | Ht 69.0 in | Wt 186.4 lb

## 2011-12-04 DIAGNOSIS — R0989 Other specified symptoms and signs involving the circulatory and respiratory systems: Secondary | ICD-10-CM

## 2011-12-04 DIAGNOSIS — R0683 Snoring: Secondary | ICD-10-CM

## 2011-12-04 DIAGNOSIS — I4729 Other ventricular tachycardia: Secondary | ICD-10-CM

## 2011-12-04 DIAGNOSIS — I472 Ventricular tachycardia: Secondary | ICD-10-CM

## 2011-12-04 DIAGNOSIS — R0609 Other forms of dyspnea: Secondary | ICD-10-CM

## 2011-12-04 DIAGNOSIS — I429 Cardiomyopathy, unspecified: Secondary | ICD-10-CM

## 2011-12-04 DIAGNOSIS — F4310 Post-traumatic stress disorder, unspecified: Secondary | ICD-10-CM

## 2011-12-04 DIAGNOSIS — Z95810 Presence of automatic (implantable) cardiac defibrillator: Secondary | ICD-10-CM

## 2011-12-04 LAB — ICD DEVICE OBSERVATION
DEV-0020ICD: NEGATIVE
FVT: 0
RV LEAD IMPEDENCE ICD: 640 Ohm
RV LEAD THRESHOLD: 1 V
TOT-0001: 23
TOT-0002: 2
TZAT-0001SLOWVT: 1
TZAT-0004FASTVT: 8
TZAT-0004FASTVT: 8
TZAT-0005FASTVT: 84 pct
TZAT-0005FASTVT: 88 pct
TZAT-0011FASTVT: 10 ms
TZAT-0012FASTVT: 200 ms
TZAT-0012FASTVT: 200 ms
TZAT-0013FASTVT: 3
TZAT-0013FASTVT: 3
TZAT-0020FASTVT: 1.5 ms
TZAT-0020SLOWVT: 1.5 ms
TZON-0003SLOWVT: 400 ms
TZON-0003VSLOWVT: 370 ms
TZON-0004SLOWVT: 16
TZON-0004VSLOWVT: 20
TZON-0010SLOWVT: 40 ms
TZST-0001FASTVT: 5
TZST-0001FASTVT: 6
TZST-0001SLOWVT: 2
TZST-0001SLOWVT: 3
TZST-0001SLOWVT: 4
TZST-0002SLOWVT: NEGATIVE
TZST-0003FASTVT: 35 J
TZST-0003FASTVT: 35 J
TZST-0003FASTVT: 35 J
VENTRICULAR PACING ICD: 0.2 pct
VF: 0

## 2011-12-04 LAB — CBC WITH DIFFERENTIAL/PLATELET
Basophils Absolute: 0 10*3/uL (ref 0.0–0.1)
Eosinophils Relative: 1.5 % (ref 0.0–5.0)
Hemoglobin: 13.3 g/dL (ref 13.0–17.0)
Lymphocytes Relative: 25.7 % (ref 12.0–46.0)
Monocytes Relative: 7.2 % (ref 3.0–12.0)
Neutro Abs: 4.9 10*3/uL (ref 1.4–7.7)
RBC: 4.44 Mil/uL (ref 4.22–5.81)
RDW: 12.8 % (ref 11.5–14.6)
WBC: 7.6 10*3/uL (ref 4.5–10.5)

## 2011-12-04 LAB — BASIC METABOLIC PANEL
Calcium: 9.7 mg/dL (ref 8.4–10.5)
GFR: 117.83 mL/min (ref >60.00)
Potassium: 4.8 mEq/L (ref 3.5–5.1)
Sodium: 141 mEq/L (ref 135–145)

## 2011-12-04 MED ORDER — SOTALOL HCL 120 MG PO TABS
120.0000 mg | ORAL_TABLET | Freq: Two times a day (BID) | ORAL | Status: DC
Start: 2011-12-04 — End: 2011-12-04

## 2011-12-04 MED ORDER — METOPROLOL SUCCINATE ER 25 MG PO TB24: ORAL_TABLET | ORAL | Status: DC
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
## 2011-12-04 NOTE — Assessment & Plan Note (Signed)
Will followup with [facility]

## 2011-12-04 NOTE — Patient Instructions (Signed)
Your physician has recommended you make the following change in your medication:  1) take metoprolol succinate 25 mg one tablet by mouth at night.  Your physician has recommended that you have a defibrillator generator change. Please see the instruction sheet given to you today for more information.

## 2011-12-04 NOTE — Assessment & Plan Note (Signed)
No recurrent ventricular tachycardia following his ablation. We will continue him on his current medications and have him take his metoprolol at night

## 2011-12-04 NOTE — Assessment & Plan Note (Signed)
He had a lengthy discussion regarding the progressive nature of the disease.
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
## 2011-12-04 NOTE — Assessment & Plan Note (Signed)
slepp studying pending

## 2011-12-10 MED ORDER — CEFAZOLIN SODIUM-DEXTROSE 2-3 GM-% IV SOLR
2.0000 g | INTRAVENOUS | Status: DC
  Filled 2011-12-10: qty 50

## 2011-12-10 MED ORDER — DIAZEPAM 5 MG PO TABS: 10.0000 mg | ORAL_TABLET | ORAL | Status: DC

## 2011-12-10 MED ORDER — SODIUM CHLORIDE 0.9 % IR SOLN
80.0000 mg | Status: DC
  Filled 2011-12-10: qty 2

## 2011-12-11 ENCOUNTER — Encounter (HOSPITAL_COMMUNITY)
Admission: RE | Disposition: A | Discharge status: Home / Self Care | Payer: Self-pay | Source: Ambulatory Visit | Attending: Internal Medicine

## 2011-12-11 ENCOUNTER — Ambulatory Visit (HOSPITAL_COMMUNITY)
Admission: RE | Admit: 2011-12-11 | Discharge: 2011-12-11 | Disposition: A | Discharge status: Home / Self Care | Payer: PRIVATE HEALTH INSURANCE | Source: Ambulatory Visit | Attending: Internal Medicine | Admitting: Internal Medicine

## 2011-12-11 ENCOUNTER — Ambulatory Visit (HOSPITAL_COMMUNITY): Payer: PRIVATE HEALTH INSURANCE

## 2011-12-11 DIAGNOSIS — I472 Ventricular tachycardia, unspecified: Secondary | ICD-10-CM

## 2011-12-11 DIAGNOSIS — I429 Cardiomyopathy, unspecified: Secondary | ICD-10-CM

## 2011-12-11 DIAGNOSIS — F4310 Post-traumatic stress disorder, unspecified: Secondary | ICD-10-CM

## 2011-12-11 DIAGNOSIS — Z4502 Encounter for adjustment and management of automatic implantable cardiac defibrillator: Secondary | ICD-10-CM | POA: Insufficient documentation

## 2011-12-11 DIAGNOSIS — F411 Generalized anxiety disorder: Secondary | ICD-10-CM | POA: Insufficient documentation

## 2011-12-11 DIAGNOSIS — I428 Other cardiomyopathies: Secondary | ICD-10-CM | POA: Insufficient documentation

## 2011-12-11 DIAGNOSIS — I4729 Other ventricular tachycardia: Secondary | ICD-10-CM | POA: Insufficient documentation

## 2011-12-11 DIAGNOSIS — Z95810 Presence of automatic (implantable) cardiac defibrillator: Secondary | ICD-10-CM

## 2011-12-11 DIAGNOSIS — R0683 Snoring: Secondary | ICD-10-CM

## 2011-12-11 HISTORY — PX: IMPLANTABLE CARDIOVERTER DEFIBRILLATOR GENERATOR CHANGE: SHX5474

## 2011-12-11 LAB — SURGICAL PCR SCREEN: Staphylococcus aureus: NEGATIVE

## 2011-12-11 SURGERY — IMPLANTABLE CARDIOVERTER DEFIBRILLATOR GENERATOR CHANGE
Anesthesia: LOCAL

## 2011-12-11 MED ORDER — MIDAZOLAM HCL 5 MG/5ML IJ SOLN
INTRAMUSCULAR | Status: AC
  Filled 2011-12-11: qty 5

## 2011-12-11 MED ORDER — SODIUM CHLORIDE 0.9 % IV SOLN
INTRAVENOUS | Status: DC
  Administered 2011-12-11: 07:00:00 via INTRAVENOUS

## 2011-12-11 MED ORDER — CHLORHEXIDINE GLUCONATE 4 % EX LIQD
60.0000 mL | Freq: Once | CUTANEOUS | Status: DC
  Filled 2011-12-11: qty 60

## 2011-12-11 MED ORDER — FENTANYL CITRATE 0.05 MG/ML IJ SOLN
INTRAMUSCULAR | Status: AC
  Filled 2011-12-11: qty 2

## 2011-12-11 MED ORDER — MUPIROCIN 2 % EX OINT
TOPICAL_OINTMENT | CUTANEOUS | Status: AC
  Filled 2011-12-11: qty 22

## 2011-12-11 MED ORDER — CHLOROPROCAINE HCL 2 % IJ SOLN
60.0000 mL | Freq: Once | INTRAMUSCULAR | Status: DC
  Filled 2011-12-11: qty 60

## 2011-12-11 MED ORDER — MUPIROCIN 2 % EX OINT
TOPICAL_OINTMENT | Freq: Two times a day (BID) | CUTANEOUS | Status: DC
  Filled 2011-12-11: qty 22

## 2011-12-11 MED ORDER — SODIUM CHLORIDE 0.45 % IV SOLN: INTRAVENOUS | Status: DC

## 2011-12-11 MED ORDER — SODIUM CHLORIDE 0.9 % IV SOLN: INTRAVENOUS | Status: AC

## 2011-12-11 MED ORDER — CEFAZOLIN SODIUM-DEXTROSE 2-3 GM-% IV SOLR
INTRAVENOUS | Status: AC
  Filled 2011-12-11: qty 50

## 2011-12-11 NOTE — H&P (View-Only) (Signed)
  HPI  [name] is a 36 y.o. male in followup for ventricular tachycardia occurring in the context of arrhythmogenic right ventricular cardiomyopathy.  He is status post endocardial ablation and epicardial mapping done at [facility] in [place]. He was arrhythmia free for about 18 months  And then developed recurrent VT  He has seen Dr. Bahnson done at [facility] and undergone repeat ablation a couple of weeks ago. He thought that this was successful. It occurred physically near to the right coronary artery, but no injury was identified.   His device is approaching a ERI  There is a fair amount of anxiety; not surprisingly was withdrawn and he will be following up with his counseling [place].  He has a prescription for Ativan prescribed at [facility].   Past Medical History  Diagnosis Date  . Ventricular tachycardia     sotalol therapy; status post catheter ablation at [facility] 10/10  . Arrhythmogenic RV Cardiomyopathy      positive family history  . Anxiety   . ICD (implantable cardiac defibrillator) in place     Past Surgical History  Procedure Date  . Cardiac catheterization 06-01-06  . Cardiac defibrillator placement 06-03-06    Medtronic  . Vt ablation 10/10    Current Outpatient Prescriptions  Medication Sig Dispense Refill  . fexofenadine (ALLEGRA) 180 MG tablet Take 180 mg by mouth at bedtime.      . fluticasone (FLONASE) 50 MCG/ACT nasal spray Place 2 sprays into the nose daily.      . magnesium oxide (MAG-OX) 400 MG tablet Take 1 tablet (400 mg total) by mouth 2 (two) times daily.  60 tablet  3  . metoprolol tartrate (LOPRESSOR) 25 MG tablet Take 25 mg by mouth 2 (two) times daily. 1/2 tablet twice a day      . montelukast (SINGULAIR) 10 MG tablet Take 10 mg by mouth at bedtime.        . Multiple Vitamin (MULITIVITAMIN WITH MINERALS) TABS Take 1 tablet by mouth daily.      . potassium chloride (KLOR-CON) 10 MEQ CR tablet Take 4 tablets (40 mEq total) by mouth 2 (two)  times daily.  250 tablet  3  . sotalol (BETAPACE) 120 MG tablet Take 120 mg by mouth 2 (two) times daily.        Allergies  Allergen Reactions  . Lidocaine     [name]    Review of Systems negative except from HPI and PMH  Physical Exam BP 126/74  Pulse 60  Ht 5' 9" (1.753 m)  Wt 186 lb 6.4 oz (84.55 kg)  BMI 27.53 kg/m2 Well developed and nourished in no acute distress HENT normal Neck supple with JVP-flat Clear Regular rate and rhythm, no murmurs or gallops Abd-soft with active BS No Clubbing cyanosis edema Skin-warm and dry A & Oriented  Grossly normal sensory and motor function  Sinus rhythm at 63 Intervals 21/08/42 Poor R wave progression   Assessment and  Plan  

## 2011-12-11 NOTE — Interval H&P Note (Signed)
History and Physical Interval Note:  12/11/2011 7:30 AM  [name]  has presented today for surgery, with the diagnosis of vt/eri  The various methods of treatment have been discussed with the patient and family. After consideration of risks, benefits and other options for treatment, the patient has consented to  Procedure(s) (LRB): IMPLANTABLE CARDIOVERTER DEFIBRILLATOR GENERATOR CHANGE (N/A) as a surgical intervention .  The patients' history has been reviewed, patient examined, no change in status, stable for surgery.  I have reviewed the patients' chart and labs.  Questions were answered to the patient's satisfaction.     Sherryl Manges

## 2011-12-11 NOTE — CV Procedure (Signed)
Proc  icd generator replacement with pocket revision and DFT check ## [id number]

## 2011-12-11 NOTE — Op Note (Signed)
NAME:  [name], [name]:  [id number]  MEDICAL RECORD NO.:  [id number]  LOCATION:  [facility]                         FACILITY:  [facility]  PHYSICIAN:  [name], MD, FACCDATE OF BIRTH:  [birth year]  DATE OF PROCEDURE:  12/11/2011 DATE OF DISCHARGE:                              OPERATIVE REPORT   PREOPERATIVE DIAGNOSES:  Previously implanted ICD now at elective replacement indicator.  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  POSTOPERATIVE DIAGNOSES:  Previously implanted ICD now at elective replacement indicator.  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  PROCEDURE:  Explantation of previous device, pocket revision, implantation of new device, and intraoperative defibrillation threshold testing.  Following obtaining informed consent, the patient was brought to electrophysiology laboratory and placed on the fluoroscopic table in supine position.  After routine prep and drape of the right upper chest, lidocaine was infiltrated just below the line of the previous incision, carried down to layer of device pocket using sharp dissection and electrocautery.  The pocket was opened.  The device was explanted.  The lead was freed up.  Interrogation of the previously implanted 6947 lead, serial number [id number] demonstrated amplitude of greater than 30 with pace impedance of 620, threshold 0.5 at 0.5.  Pacing through the high- voltage impedances were 667 and 610 respectively.  The lead was then attached to Medtronic Protecta [id number] device, serial number [id number].  Through the device, bipolar R-wave was 20 with pace impedance of 608, a threshold 0.75 at 0.4.  The leads and pulse generator were then placed in the pocket and secured to the prepectoral fascia.  The pocket had to be extended cephalad.  The wound was closed in 3 layers in normal fashion, following antibiotic  irrigation.  Defibrillation threshold testing was then undertaken.  Ventricular fibrillation was induced via T-wave shock after total duration of 9 seconds, a 25 joule shock was delivered through a measured resistance of 44 ohms, terminating ventricular fibrillation and restoring sinus rhythm.  The patient tolerated the procedure without apparent complications.  Needle counts, sponge counts, and instrument counts were correct at the end of the procedure according to staff.     [name], MD, [facility]     SCK/MEDQ  D:  12/11/2011  T:  12/11/2011  Job:  [id number]

## 2011-12-11 NOTE — Discharge Instructions (Signed)
Pacemaker Battery Change A pacemaker battery usually lasts 4 to 12 years. Once or twice per year, you will be asked to visit your caregiver to have a full evaluation of your pacemaker. When a battery needs to be replaced, the entire pacemaker is actually replaced so that you can benefit from new circuitry and any new features that have recently been added to pacemakers. Most often, this procedure is very simple because the leads are already in place. After giving medicine to numb the skin, your health care provider makes a cut to reopen the pocket holding the pacemaker and disconnects the old device from its leads. The leads are routinely tested at this time. If they are working okay, the new pacemaker may simply be connected to the existing leads. If there is any problem with the old lead system, it may be wise to replace the lead system while inserting the new pacemaker. There are many things that affect how long a pacemaker battery will last:  Age of the pacemaker.   Number of leads (1, 2 or 3).   Pacemaker work load. If the pacemaker is helping the heart more often, then the battery will not last as long as if the pacemaker does not need to help the heart.   Resistance of the leads. The greater the resistance, the greater the drain on the battery. This can increase as the leads get older or if one or more of the leads does not have the best contact with the heart.   Power (voltage) settings.   The health of the person's heart. If the health of the heart gets worse, then the pacemaker may have to work more often and the setting changed to accommodate these changes.  Your health care provider will be alerted to the fact that it is time to replace the battery during follow-up exams. He or she will check your pacemaker using a small table-top computer, called a programmer, and a wand. The wand is about the same size as a remote control. Your provider puts the wand on your body in the area where the  pacemaker is located. Information from the pacemaker is received about how well your heart is working and the status of the battery. It is not painful, and it usually takes just a few minutes. You will have plenty of time before the battery is fully used up to plan for replacement.  LET YOUR CAREGIVER KNOW ABOUT:   Symptoms of chest pain, trouble breathing, palpitations, lightheadedness, or feelings of an abnormal or irregular heart beat.   Allergies.   Medications taken including herbs, eye drops, over the counter medications, and creams   Use of steroids (by mouth or creams).   Possible pregnancy, if applicable.   Previous problems with anesthetics or Novocaine.   History of blood clots (thrombophlebitis).   History of bleeding or blood problems.   Surgery since your last pacemaker placement.   Other health problems.  RISKS AND COMPLICATIONS These are very uncommon but include:  Bleeding.   Bruising of the skin around where the incision was made.   Pain at the site of the incision.   Pulling apart of the skin at the incision site.   Infection.   Allergic reaction to anesthetics or medicines used during the procedure.  Diabetics may have a temporary increase in their blood sugar after any surgical procedure.  BEFORE THE PROCEDURE  Wash all of the skin around the area of the chest where the pacemaker is located.   Try to remove any loose, scaling skin. Unless advised otherwise, avoid using aspirin, ibuprofen, or naproxen for 3-4 days before the procedure. Ask your caregiver for help with any other medication adjustments before the pacemaker is replaced. Unless advised otherwise, do not eat or drink after midnight on the night before the procedure EXCEPT for drinking water and taking your medications as you normally would. AFTER THE PROCEDURE   A heart monitor and the pacemaker programmer will be used to make sure that the new pacemaker is working properly.   You can go home  after the procedure.   Your caregiver will advise you if you need to have any stitches. They will be removed 5-7 days after the procedure.  HOME CARE INSTRUCTIONS   Keep the incision clean and dry.   Unless advised otherwise, you may shower after carefully covering the incision with plastic wrap that is taped to your chest.   For the first week after the replacement, avoid stretching motions that pull at the incision site and avoid heavy exercise with the arm on the same side as the incision.   Only take over-the-counter or prescription medicines for pain, discomfort, or fever as directed by your caregiver.   Your caregiver will tell you when you will need to next test your pacemaker by telephone or when to return to the office for re-exam and/or removal of stitches, if necessary.  SEEK MEDICAL CARE IF:   You have unusual pain at the incision site that is not adequately helped by over-the-counter or prescription medicine.   There is drainage or pus from the incision site.   You develop red streaking that extends above or below the incision site.   You feel brief intermittent palpitations, lightheadedness or any symptoms that you feel might be related to your heart.  SEEK IMMEDIATE MEDICAL CARE IF:   You experience chest pain that is different than the pain at the incision site.   You experience:   Shortness of breath.   Palpitations.   Irregular heart beat.   Lightheadedness that does not go away quickly.   Fainting.   You develop a fever.   You have pain that gets worse even though you are taking pain medicine.  MAKE SURE YOU:   Understand these instructions.   Will watch your condition.   Will get help right away if you are not doing well or get worse.  Document Released: 11/12/2006 Document Revised: 07/24/2011 Document Reviewed: 02/15/2007 [employer] Patient Information 2012 [employer]. 

## 2011-12-17 ENCOUNTER — Encounter: Payer: PRIVATE HEALTH INSURANCE | Admitting: Internal Medicine

## 2011-12-22 ENCOUNTER — Ambulatory Visit (INDEPENDENT_AMBULATORY_CARE_PROVIDER_SITE_OTHER): Payer: PRIVATE HEALTH INSURANCE | Admitting: *Deleted

## 2011-12-22 ENCOUNTER — Encounter: Payer: Self-pay | Admitting: Internal Medicine

## 2011-12-22 DIAGNOSIS — I429 Cardiomyopathy, unspecified: Secondary | ICD-10-CM

## 2011-12-22 DIAGNOSIS — I472 Ventricular tachycardia: Secondary | ICD-10-CM

## 2011-12-22 DIAGNOSIS — I4729 Other ventricular tachycardia: Secondary | ICD-10-CM

## 2011-12-22 LAB — ICD DEVICE OBSERVATION
CHARGE TIME: 4.814 s
DEV-0020ICD: NEGATIVE
FVT: 0
RV LEAD AMPLITUDE: 24.375 mv
RV LEAD IMPEDENCE ICD: 589 Ohm
RV LEAD THRESHOLD: 0.625 V
TOT-0001: 1
TOT-0002: 0
TOT-0006: 20130425000000
TZAT-0004FASTVT: 8
TZAT-0011SLOWVT: 10 ms
TZAT-0011SLOWVT: 10 ms
TZAT-0012FASTVT: 170 ms
TZAT-0012SLOWVT: 170 ms
TZAT-0012SLOWVT: 170 ms
TZAT-0013FASTVT: 1
TZAT-0013SLOWVT: 3
TZAT-0018FASTVT: NEGATIVE
TZAT-0018SLOWVT: NEGATIVE
TZAT-0018SLOWVT: NEGATIVE
TZAT-0019SLOWVT: 8 V
TZAT-0019SLOWVT: 8 V
TZAT-0020FASTVT: 1.5 ms
TZON-0003FASTVT: 240 ms
TZON-0003SLOWVT: 360 ms
TZON-0004SLOWVT: 100
TZON-0005SLOWVT: 12
TZST-0001FASTVT: 4
TZST-0001FASTVT: 5
TZST-0001SLOWVT: 4
TZST-0003FASTVT: 35 J
TZST-0003FASTVT: 35 J
TZST-0003SLOWVT: 35 J
TZST-0003SLOWVT: 35 J

## 2011-12-22 NOTE — Progress Notes (Signed)
Wound check-ICD 

## 2011-12-23 ENCOUNTER — Telehealth: Payer: Self-pay | Admitting: Internal Medicine

## 2011-12-23 NOTE — Telephone Encounter (Signed)
Patient was given Ativan at discharge from [facility] he is taking 1mg  bid and still needs it.  I have given him #60 with no refills  Patient aware

## 2011-12-23 NOTE — Telephone Encounter (Signed)
Pt calling re meds.

## 2012-01-01 ENCOUNTER — Ambulatory Visit (INDEPENDENT_AMBULATORY_CARE_PROVIDER_SITE_OTHER): Payer: PRIVATE HEALTH INSURANCE | Admitting: *Deleted

## 2012-01-01 ENCOUNTER — Encounter: Payer: Self-pay | Admitting: Internal Medicine

## 2012-01-01 DIAGNOSIS — I472 Ventricular tachycardia: Secondary | ICD-10-CM

## 2012-01-01 DIAGNOSIS — I4729 Other ventricular tachycardia: Secondary | ICD-10-CM

## 2012-01-01 DIAGNOSIS — I429 Cardiomyopathy, unspecified: Secondary | ICD-10-CM

## 2012-01-01 LAB — ICD DEVICE OBSERVATION
DEV-0020ICD: NEGATIVE
TZAT-0001FASTVT: 1
TZAT-0004FASTVT: 8
TZAT-0004SLOWVT: 8
TZAT-0004SLOWVT: 8
TZAT-0005FASTVT: 88 pct
TZAT-0012SLOWVT: 170 ms
TZAT-0012SLOWVT: 170 ms
TZAT-0019SLOWVT: 8 V
TZAT-0020SLOWVT: 1.5 ms
TZAT-0020SLOWVT: 1.5 ms
TZON-0003SLOWVT: 360 ms
TZON-0003VSLOWVT: 450 ms
TZON-0004VSLOWVT: 20
TZST-0001FASTVT: 2
TZST-0001FASTVT: 5
TZST-0001SLOWVT: 3
TZST-0001SLOWVT: 5
TZST-0003FASTVT: 35 J
TZST-0003FASTVT: 35 J
TZST-0003FASTVT: 35 J
TZST-0003SLOWVT: 35 J
TZST-0003SLOWVT: 35 J

## 2012-01-01 NOTE — Progress Notes (Signed)
icd check in clinic  

## 2012-01-05 ENCOUNTER — Telehealth: Payer: Self-pay | Admitting: Internal Medicine

## 2012-01-05 NOTE — Telephone Encounter (Signed)
Will forward to Dr. Klein for review. 

## 2012-01-05 NOTE — Telephone Encounter (Signed)
New problem:  Per Patrecia Pace calling from [facility], have a couple of question to ask Dr. Graciela Husbands.  Limitation of ability, help to deal with anxiety.

## 2012-01-09 NOTE — Telephone Encounter (Signed)
Spoke with counselor about PTSD and issue of progression of ARVC

## 2012-01-23 ENCOUNTER — Telehealth: Payer: Self-pay | Admitting: Internal Medicine

## 2012-01-23 NOTE — Telephone Encounter (Signed)
Spoke with patient about genetic testing which will arrange and abnormal sleep study for which we will make a referral to pulmonary

## 2012-01-27 ENCOUNTER — Other Ambulatory Visit (HOSPITAL_COMMUNITY): Payer: Self-pay | Admitting: Cardiology

## 2012-01-27 ENCOUNTER — Telehealth: Payer: Self-pay | Admitting: Pulmonary Disease

## 2012-01-27 ENCOUNTER — Encounter: Payer: Self-pay | Admitting: *Deleted

## 2012-01-27 NOTE — Telephone Encounter (Signed)
Lawson Fiscal, can we get this pt in for a sleep consult with me in the next few weeks.  Referred by Graciela Husbands.

## 2012-01-29 NOTE — Telephone Encounter (Signed)
Pt is scheduled for Fri., 02/06/12 @ 11pm. Told to arrive at 10:45 and given address and telephone number for our office.

## 2012-02-02 ENCOUNTER — Encounter: Payer: Self-pay | Admitting: *Deleted

## 2012-02-06 ENCOUNTER — Encounter: Payer: Self-pay | Admitting: Pulmonary Disease

## 2012-02-06 ENCOUNTER — Ambulatory Visit (INDEPENDENT_AMBULATORY_CARE_PROVIDER_SITE_OTHER): Payer: PRIVATE HEALTH INSURANCE | Admitting: Pulmonary Disease

## 2012-02-06 VITALS — BP 106/78 | HR 62 | Temp 98.4°F | Ht 69.0 in | Wt 186.0 lb

## 2012-02-06 DIAGNOSIS — G4733 Obstructive sleep apnea (adult) (pediatric): Secondary | ICD-10-CM

## 2012-02-06 DIAGNOSIS — R0683 Snoring: Secondary | ICD-10-CM

## 2012-02-06 NOTE — Progress Notes (Deleted)
  Subjective:    Patient ID: [name], male    DOB: 1976/02/06, 36 y.o.   MRN: [id number]  HPI    Review of Systems  Constitutional: Negative for fever and unexpected weight change.  HENT: Positive for congestion, sore throat and trouble swallowing. Negative for ear pain, nosebleeds, rhinorrhea, sneezing, dental problem, postnasal drip and sinus pressure.   Eyes: Negative for redness and itching.  Respiratory: Positive for shortness of breath. Negative for cough, chest tightness and wheezing.   Cardiovascular: Positive for palpitations. Negative for leg swelling.  Gastrointestinal: Negative for nausea and vomiting.       Heartburn   Genitourinary: Negative for dysuria.  Musculoskeletal: Negative for joint swelling.  Skin: Negative for rash.  Neurological: Negative for headaches.  Hematological: Does not bruise/bleed easily.  Psychiatric/Behavioral: Negative for dysphoric mood. The patient is nervous/anxious.   All other systems reviewed and are negative.       Objective:   Physical Exam Constitutional:  Well developed, no acute distress  HENT:  Nares patent without discharge, but +septal deviation  Oropharynx without exudate, palate and uvula are mildly elongated.  +small lower jaw.  Eyes:  Perrla, eomi, no scleral icterus  Neck:  No JVD, no TMG  Cardiovascular:  Normal rate, regular rhythm, no rubs or gallops.  No murmurs        Intact distal pulses  Pulmonary :  Normal breath sounds, no stridor or respiratory distress   No rales, rhonchi, or wheezing  Abdominal:  Soft, nondistended, bowel sounds present.  No tenderness noted.   Musculoskeletal:  No lower extremity edema noted.  Lymph Nodes:  No cervical lymphadenopathy noted  Skin:  No cyanosis noted  Neurologic:  Alert, appropriate, moves all 4 extremities without obvious deficit.         Assessment & Plan:

## 2012-02-06 NOTE — Progress Notes (Signed)
Subjective:    Patient ID: [name], male    DOB: 02-15-1976, 36 y.o.   MRN: [id number]  HPI The patient is a 36 year old male been asked to see for management of obstructive sleep apnea.  He recently underwent nocturnal polysomnography where he was found to have moderate OSA, with an AHI of 22 events per hour.  He was then placed on CPAP and found to have an optimal pressure of 9 cm of water.  The patient states he has been noted to have loud snoring, but no one has commented on an abnormal breathing pattern during sleep.  He did have loud snoring as a child, and was felt to have sleep apnea, and underwent a tonsillectomy.  Patient currently has frequent awakenings at night, and is never rested in the mornings upon arising.  He describes intermittent sleep pressured during the day with periods of inactivity, but occurs primarily in the afternoon.  He has no issues in the evening with sleepiness except between the hours of 6 to 9 PM.  He has noted sleep pressured during driving except longer distances.  The patient states that his weight is been neutral over the last 2 years, and his Epworth score today is abnormal at 11.  Sleep Questionnaire: What time do you typically go to bed?( Between what hours) 10:30pm - 12am How long does it take you to fall asleep? 10-20 minutes How many times during the night do you wake up? 3 What time do you get out of bed to start your day? Do you drive or operate heavy machinery in your occupation? Yes How much has your weight changed (up or down) over the past two years? (In pounds) 5 lb (2.268 kg) Have you ever had a sleep study before? Yes If yes, location of study? [facility] If yes, date of study? 11/2011 Do you currently use CPAP? No Do you wear oxygen at any time? No     Review of Systems  Constitutional: Negative.  Negative for fever and unexpected weight change.  HENT: Positive for congestion, sore throat and trouble swallowing. Negative for ear pain,  nosebleeds, rhinorrhea, sneezing, dental problem, postnasal drip and sinus pressure.   Eyes: Negative.  Negative for redness and itching.  Respiratory: Positive for shortness of breath. Negative for cough, chest tightness and wheezing.   Cardiovascular: Negative for leg swelling.  Gastrointestinal: Negative.  Negative for nausea and vomiting.       Heartburn   Genitourinary: Negative.  Negative for dysuria.  Musculoskeletal: Negative.  Negative for joint swelling.  Skin: Negative.  Negative for rash.  Neurological: Negative for headaches.  Hematological: Negative.  Does not bruise/bleed easily.  Psychiatric/Behavioral: Negative.  Negative for dysphoric mood. The patient is not nervous/anxious.        Objective:   Physical Exam Constitutional:  Well developed, no acute distress  HENT:  Nares patent without discharge, but + deviated septum.   Oropharynx without exudate, palate and uvula are mildly elongated.  Eyes:  Perrla, eomi, no scleral icterus  Neck:  No JVD, no TMG  Cardiovascular:  Normal rate, regular rhythm, no rubs or gallops.  No murmurs        Intact distal pulses  Pulmonary :  Normal breath sounds, no stridor or respiratory distress   No rales, rhonchi, or wheezing  Abdominal:  Soft, nondistended, bowel sounds present.  No tenderness noted.   Musculoskeletal:  No lower extremity edema noted.  Lymph Nodes:  No cervical lymphadenopathy noted  Skin:  No cyanosis noted  Neurologic:  Mildly sleepy, appropriate, moves all 4 extremities without obvious deficit. .       Assessment & Plan:

## 2012-02-06 NOTE — Patient Instructions (Addendum)
Will start on cpap to see how you respond.  Please call if having tolerance issues.  Work on modest weight loss followup with me in 6 weeks.

## 2012-02-09 NOTE — Assessment & Plan Note (Signed)
The patient has moderate obstructive sleep apnea, and is also symptomatic.  He has significant underlying cardiac disease that would benefit from aggressive treatment of his sleep disordered breathing.  I have discussed the pathophysiology of sleep apnea with him, including its impact to his quality of life and cardiovascular health.  I think CPAP while working on the list weight loss would be his best treatment option.  The patient is agreeable.

## 2012-02-20 ENCOUNTER — Telehealth: Payer: Self-pay | Admitting: *Deleted

## 2012-02-20 NOTE — Telephone Encounter (Signed)
FYI;  FROM [facility]  RE; CPAP SETUP Mr [name] told me he is going on [place] trip and wont start using cpap for a couple of weeks.  We'll get a download in 6 weeks- let us know if you need anything sooner.  Thank You!  [name]   If you have any questions please contact our office @   Phone: [phone]  Fax [phone]

## 2012-03-08 ENCOUNTER — Encounter: Payer: Self-pay | Admitting: *Deleted

## 2012-03-19 ENCOUNTER — Encounter: Payer: Self-pay | Admitting: Pulmonary Disease

## 2012-03-19 ENCOUNTER — Ambulatory Visit (INDEPENDENT_AMBULATORY_CARE_PROVIDER_SITE_OTHER): Payer: PRIVATE HEALTH INSURANCE | Admitting: Pulmonary Disease

## 2012-03-19 VITALS — BP 112/80 | HR 51 | Temp 98.1°F | Ht 69.0 in | Wt 184.4 lb

## 2012-03-19 DIAGNOSIS — G4733 Obstructive sleep apnea (adult) (pediatric): Secondary | ICD-10-CM

## 2012-03-19 NOTE — Progress Notes (Signed)
  Subjective:    Patient ID: [name], male    DOB: 02-15-1976, 36 y.o.   MRN: [id number]  HPI Patient comes in today for followup of his known obstructive sleep apnea.  He was started on CPAP at last visit, and comes in for followup.  He is having no issues with CPAP pressure, but has not been able to get a CPAP mask that fits properly.  He has really not worked with his DME on a consistent basis.  Because of this, he is only wearing CPAP 2-4 hours a night.   Review of Systems  Constitutional: Negative.  Negative for fever and unexpected weight change.  HENT: Positive for congestion, sneezing and sinus pressure. Negative for ear pain, nosebleeds, sore throat, rhinorrhea, trouble swallowing, dental problem and postnasal drip.        Seasonal allergies  Eyes: Positive for itching. Negative for redness.  Respiratory: Positive for chest tightness and shortness of breath. Negative for cough and wheezing.        SOB/chest tightness from heart condition  Cardiovascular: Negative.  Negative for palpitations and leg swelling.  Gastrointestinal: Negative.  Negative for nausea and vomiting.  Genitourinary: Negative.  Negative for dysuria.  Musculoskeletal: Negative.  Negative for joint swelling.  Skin: Negative for rash.  Neurological: Negative.  Negative for headaches.  Hematological: Negative.  Does not bruise/bleed easily.  Psychiatric/Behavioral: Positive for dysphoric mood. The patient is not nervous/anxious.        Pt suffers from PTSD       Objective:   Physical Exam Well-developed male in no acute distress No skin breakdown or pressure necrosis from the CPAP mask, however there is an area of erythema over the bridge of the nose. Lower extremities without edema, no cyanosis Alert, but does appear mildly sleepy, moves all 4 extremities.       Assessment & Plan:

## 2012-03-19 NOTE — Assessment & Plan Note (Signed)
The patient has had poor CPAP tolerance because of mask fit issues.  At this time, I would like for him to work with his DME to try and get a better fitting mask.  If he continues to struggle, would arrange for a formal fitting at the sleep Center.  I've asked the patient to call me in about 4 weeks to update on his progress.  I will then arrange formal followup for him at that time.

## 2012-03-19 NOTE — Patient Instructions (Addendum)
Will have your dme work with you on a better mask fit. Please call me in 4 weeks to update how things are going with the new mask.  Need to hear from you sooner if not going well. Will setup followup visit once I see how things are going.

## 2012-04-01 ENCOUNTER — Encounter: Payer: Self-pay | Admitting: *Deleted

## 2012-04-08 ENCOUNTER — Encounter: Payer: Self-pay | Admitting: Internal Medicine

## 2012-04-08 ENCOUNTER — Ambulatory Visit (INDEPENDENT_AMBULATORY_CARE_PROVIDER_SITE_OTHER): Payer: PRIVATE HEALTH INSURANCE | Admitting: Internal Medicine

## 2012-04-08 VITALS — BP 150/89 | HR 57 | Ht 70.0 in | Wt 185.4 lb

## 2012-04-08 DIAGNOSIS — F4310 Post-traumatic stress disorder, unspecified: Secondary | ICD-10-CM

## 2012-04-08 DIAGNOSIS — I429 Cardiomyopathy, unspecified: Secondary | ICD-10-CM

## 2012-04-08 DIAGNOSIS — Z95810 Presence of automatic (implantable) cardiac defibrillator: Secondary | ICD-10-CM

## 2012-04-08 DIAGNOSIS — I472 Ventricular tachycardia: Secondary | ICD-10-CM

## 2012-04-08 DIAGNOSIS — G4733 Obstructive sleep apnea (adult) (pediatric): Secondary | ICD-10-CM

## 2012-04-08 DIAGNOSIS — I4729 Other ventricular tachycardia: Secondary | ICD-10-CM

## 2012-04-08 LAB — ICD DEVICE OBSERVATION
BATTERY VOLTAGE: 3.2207 V
BRDY-0002RV: 40 {beats}/min
PACEART VT: 0
TOT-0006: 20130425000000
TZAT-0001FASTVT: 1
TZAT-0001SLOWVT: 1
TZAT-0001SLOWVT: 2
TZAT-0004SLOWVT: 8
TZAT-0004SLOWVT: 8
TZAT-0013SLOWVT: 3
TZAT-0013SLOWVT: 3
TZAT-0018FASTVT: NEGATIVE
TZAT-0019FASTVT: 8 V
TZAT-0020SLOWVT: 1.5 ms
TZAT-0020SLOWVT: 1.5 ms
TZON-0003FASTVT: 240 ms
TZON-0003VSLOWVT: 450 ms
TZON-0004SLOWVT: 48
TZON-0004VSLOWVT: 52
TZST-0001FASTVT: 2
TZST-0001FASTVT: 4
TZST-0001FASTVT: 6
TZST-0001SLOWVT: 3
TZST-0001SLOWVT: 5
TZST-0003FASTVT: 35 J
TZST-0003FASTVT: 35 J
TZST-0003FASTVT: 35 J
TZST-0003SLOWVT: 35 J
VENTRICULAR PACING ICD: 1.19 pct
VF: 0

## 2012-04-08 LAB — MAGNESIUM: Magnesium: 2 mg/dL (ref 1.5–2.5)

## 2012-04-08 LAB — BASIC METABOLIC PANEL
BUN: 9 mg/dL (ref 6–23)
Calcium: 9.6 mg/dL (ref 8.4–10.5)
Creatinine, Ser: 0.8 mg/dL (ref 0.4–1.5)
GFR: 114.28 mL/min (ref >60.00)

## 2012-04-08 MED ORDER — SOTALOL HCL 120 MG PO TABS: ORAL_TABLET | ORAL | Status: DC

## 2012-04-08 NOTE — Progress Notes (Signed)
  HPI  [name] is a 36 y.o. male Seen in followup for ventricular tachycardia in the setting of cardiomyopathy-arrhythmogenic. He is status post catheter ablation. He has had a couple of episodes of nonsustained VT which has been very difficult for him to no.  He is continuing with counseling for his PTSD.    Past Medical History  Diagnosis Date  . Ventricular tachycardia     sotalol therapy; status post catheter ablation at [facility] 10/10  . Arrhythmogenic RV Cardiomyopathy      positive family history  . Anxiety   . ICD (implantable cardiac defibrillator) in place     Past Surgical History  Procedure Date  . Cardiac catheterization 06-01-06  . Cardiac defibrillator placement 06-03-06    Medtronic  . Vt ablation 10/10    Current Outpatient Prescriptions  Medication Sig Dispense Refill  . fexofenadine (ALLEGRA) 180 MG tablet Take 180 mg by mouth at bedtime.      . fluticasone (FLONASE) 50 MCG/ACT nasal spray Place 2 sprays into the nose daily as needed.       Marland Kitchen KLOR-CON M10 10 MEQ tablet TAKE 4 TABLETS BY MOUTH 2 TIMES A DAY  250 tablet  3  . LORazepam (ATIVAN) 1 MG tablet Take 1 mg by mouth 2 (two) times daily as needed.      . magnesium oxide (MAG-OX) 400 (241.3 MG) MG tablet TAKE 1 TABLET TWICE A DAY  60 tablet  3  . metoprolol succinate (TOPROL-XL) 25 MG 24 hr tablet Take 25 mg by mouth at bedtime.      . montelukast (SINGULAIR) 10 MG tablet Take 10 mg by mouth at bedtime.       . Multiple Vitamin (MULITIVITAMIN WITH MINERALS) TABS Take 1 tablet by mouth daily.      . sotalol (BETAPACE) 120 MG tablet Take 120 mg by mouth 2 (two) times daily.        Allergies  Allergen Reactions  . Lidocaine     [name]    Review of Systems negative except from HPI and PMH  Physical Exam BP 150/89  Pulse 57  Ht 5\' 10"  (1.778 m)  Wt 185 lb 6.4 oz (84.097 kg)  BMI 26.60 kg/m2 Well developed and nourished in no acute distress HENT normal Neck supple with  JVP-flat Clear Regular rate and rhythm, no murmurs or gallops Abd-soft with active BS No Clubbing cyanosis edema Skin-warm and dry A & Oriented  Grossly normal sensory and motor function  Electrocardiogram today demonstrates a knee and right/09/43  Assessment and  Plan F

## 2012-04-08 NOTE — Assessment & Plan Note (Signed)
The patient's device was interrogated.  The information was reviewed. No changes were made in the programming.    

## 2012-04-08 NOTE — Assessment & Plan Note (Signed)
Continue current medications with dose adjustments as noted above

## 2012-04-08 NOTE — Patient Instructions (Signed)
Your physician has recommended you make the following change in your medication:  1) Increase sotalol to 120 mg 1 & 1/2 tablets by mouth daily.  Your physician recommends that you have lab work today: bmp/magnesium  Your physician recommends that you schedule a follow-up appointment in: 4 months.

## 2012-04-08 NOTE — Assessment & Plan Note (Signed)
Patient has had recurrent nonsustained ventricular tachycardia. We will plan to increase his sotalol from 120-180 twice daily and check potassium and magnesium levels today.

## 2012-04-08 NOTE — Assessment & Plan Note (Signed)
We will continue to work on this. I have recurrent dictation to be ICD in September  We spent about 25 discussing this aspect of his life

## 2012-04-08 NOTE — Assessment & Plan Note (Signed)
He has started therapy and is sleeping better.

## 2012-04-14 NOTE — Addendum Note (Signed)
Addended by: Linzie Collin D on: 04/14/2012 02:16 PM   Modules accepted: Orders

## 2012-04-16 ENCOUNTER — Ambulatory Visit: Payer: Self-pay | Admitting: General Practice

## 2012-04-27 ENCOUNTER — Other Ambulatory Visit: Payer: Self-pay | Admitting: Internal Medicine

## 2012-05-06 ENCOUNTER — Telehealth: Payer: Self-pay | Admitting: Internal Medicine

## 2012-05-06 DIAGNOSIS — I4729 Other ventricular tachycardia: Secondary | ICD-10-CM

## 2012-05-06 DIAGNOSIS — I472 Ventricular tachycardia: Secondary | ICD-10-CM

## 2012-05-06 MED ORDER — SOTALOL HCL (AF) 120 MG PO TABS: ORAL_TABLET | ORAL | Status: DC

## 2012-05-06 NOTE — Telephone Encounter (Signed)
I spoke with the patient. His Sotalol RX was incorrect in his chart. This should be for 120 mg 1 & 1/2 tablets by mouth twice daily. Per the patient, he would like [facility] to [facility] [address]. I explained I will correct this and send this in for him.

## 2012-05-06 NOTE — Telephone Encounter (Signed)
New problem:  Need clarification of direction & dosage of sotalol .   Patient is stating this should be twice a day . Need the correct Rx called in .

## 2012-05-07 ENCOUNTER — Telehealth: Payer: Self-pay | Admitting: *Deleted

## 2012-05-07 NOTE — Telephone Encounter (Signed)
[facility] spoke with [name], she had stated that the pt's SOTALOL 120 MG, was not the same medication as before.  Pt currently take SOTALOL 120 MG qd the drug its self was changed to SOTALOL AF 120 MG.  Pharmacy is aware it was increased to bid, but the plain SOTALOL is not interchangable with SOTALOL AF.  Please advise, Christy at [facility] [phone].  Caralee Ates, CMA

## 2012-05-10 ENCOUNTER — Telehealth: Payer: Self-pay | Admitting: *Deleted

## 2012-05-10 DIAGNOSIS — I472 Ventricular tachycardia, unspecified: Secondary | ICD-10-CM

## 2012-05-10 NOTE — Telephone Encounter (Signed)
Call from [facility] that the patient's sotalol dose was called in for long acting instead of plain sotalol like he has been taking. I advised he should get what he has been on. They will dispense the same band at the new dosing. I will fix this in his chart.

## 2012-05-27 ENCOUNTER — Other Ambulatory Visit (HOSPITAL_COMMUNITY): Payer: Self-pay | Admitting: Internal Medicine

## 2012-06-03 ENCOUNTER — Other Ambulatory Visit (HOSPITAL_COMMUNITY): Payer: Self-pay | Admitting: Internal Medicine

## 2012-06-04 ENCOUNTER — Telehealth: Payer: Self-pay | Admitting: Cardiology

## 2012-06-04 NOTE — Telephone Encounter (Signed)
Refill-  Please research [facility] refill, pt will run out of meds over the weekend.  Notes [facility]

## 2012-07-12 ENCOUNTER — Encounter: Payer: PRIVATE HEALTH INSURANCE | Admitting: *Deleted

## 2012-07-20 ENCOUNTER — Encounter: Payer: Self-pay | Admitting: *Deleted

## 2012-07-23 ENCOUNTER — Other Ambulatory Visit: Payer: Self-pay | Admitting: Internal Medicine

## 2012-07-23 NOTE — Telephone Encounter (Signed)
F/u   Pt calling for status on this refill.  Pt needs meds today as he is out of.  He can be reached at [phone] for additional information.

## 2012-07-23 NOTE — Telephone Encounter (Signed)
Pt out of metoprolol and needs refill asap [facility] [address]

## 2012-07-27 ENCOUNTER — Encounter: Payer: Self-pay | Admitting: Internal Medicine

## 2012-07-27 ENCOUNTER — Ambulatory Visit (INDEPENDENT_AMBULATORY_CARE_PROVIDER_SITE_OTHER): Payer: PRIVATE HEALTH INSURANCE | Admitting: Internal Medicine

## 2012-07-27 VITALS — BP 126/80 | HR 59 | Resp 18 | Ht 69.0 in | Wt 196.4 lb

## 2012-07-27 DIAGNOSIS — E876 Hypokalemia: Secondary | ICD-10-CM

## 2012-07-27 DIAGNOSIS — I472 Ventricular tachycardia: Secondary | ICD-10-CM

## 2012-07-27 DIAGNOSIS — I429 Cardiomyopathy, unspecified: Secondary | ICD-10-CM

## 2012-07-27 DIAGNOSIS — Z95810 Presence of automatic (implantable) cardiac defibrillator: Secondary | ICD-10-CM

## 2012-07-27 DIAGNOSIS — I4729 Other ventricular tachycardia: Secondary | ICD-10-CM

## 2012-07-27 DIAGNOSIS — F4310 Post-traumatic stress disorder, unspecified: Secondary | ICD-10-CM

## 2012-07-27 LAB — ICD DEVICE OBSERVATION
BATTERY VOLTAGE: 3.21 V
MODE SWITCH EPISODES: 0
PACEART VT: 0
TZAT-0001FASTVT: 1
TZAT-0004FASTVT: 8
TZAT-0004SLOWVT: 8
TZAT-0004SLOWVT: 8
TZAT-0005FASTVT: 88 pct
TZAT-0011SLOWVT: 10 ms
TZAT-0011SLOWVT: 10 ms
TZAT-0012SLOWVT: 170 ms
TZAT-0012SLOWVT: 170 ms
TZAT-0013FASTVT: 1
TZAT-0019SLOWVT: 8 V
TZAT-0020SLOWVT: 1.5 ms
TZAT-0020SLOWVT: 1.5 ms
TZON-0003SLOWVT: 360 ms
TZON-0003VSLOWVT: 450 ms
TZON-0004VSLOWVT: 52
TZST-0001FASTVT: 2
TZST-0001FASTVT: 5
TZST-0001SLOWVT: 5
TZST-0003FASTVT: 35 J
TZST-0003FASTVT: 35 J
TZST-0003FASTVT: 35 J
TZST-0003FASTVT: 35 J
TZST-0003SLOWVT: 35 J
TZST-0003SLOWVT: 35 J
TZST-0003SLOWVT: 35 J
VF: 0

## 2012-07-27 LAB — BASIC METABOLIC PANEL
Calcium: 9.4 mg/dL (ref 8.4–10.5)
Creatinine, Ser: 0.9 mg/dL (ref 0.4–1.5)

## 2012-07-27 LAB — MAGNESIUM: Magnesium: 2 mg/dL (ref 1.5–2.5)

## 2012-07-27 NOTE — Assessment & Plan Note (Signed)
We'll recheck metabolic profile today

## 2012-07-27 NOTE — Assessment & Plan Note (Addendum)
QTC is okay on sotalol. We'll check electrolytes. 3 episodes of nonsustained; somewhat disconcerting the patient and me.

## 2012-07-27 NOTE — Assessment & Plan Note (Signed)
The patient's device was interrogated.  The information was reviewed. No changes were made in the programming.    

## 2012-07-27 NOTE — Patient Instructions (Addendum)
LABS TODAY:  BMET & MAG  Remote monitoring is used to monitor your Pacemaker of ICD from home. This monitoring reduces the number of office visits required to check your device to one time per year. It allows [place] to keep an eye on the functioning of your device to ensure it is working properly. You are scheduled for a device check from home on 11/01/2012. You may send your transmission at any time that day. If you have a wireless device, the transmission will be sent automatically. After your physician reviews your transmission, you will receive a postcard with your next transmission date.  Your physician wants you to follow-up in: 6 months with Dr. Graciela Husbands. You will receive a reminder letter in the mail two months in advance. If you don't receive a letter, please call our office to schedule the follow-up appointment.

## 2012-07-27 NOTE — Assessment & Plan Note (Signed)
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

## 2012-07-27 NOTE — Assessment & Plan Note (Signed)
Somewhat improved

## 2012-07-27 NOTE — Progress Notes (Signed)
  HPI  [name] is a 36 y.o. male Seen in followup for ventricular tachycardia in the setting of cardiomyopathy-arrhythmogenic. He is status post catheter ablation. He has had a couple of episodes of nonsustained VT which has been very difficult for him. At last visit we increased his sotalol  No significant palpitations ; major complaint is fatigue  He is continuing with counseling for his PTSD. Doing osome better    Past Medical History  Diagnosis Date  . Ventricular tachycardia     sotalol therapy; status post catheter ablation at [facility] 10/10  . Arrhythmogenic RV Cardiomyopathy      positive family history  . Anxiety   . ICD (implantable cardiac defibrillator) in place     Past Surgical History  Procedure Date  . Cardiac catheterization 06-01-06  . Cardiac defibrillator placement 06-03-06    Medtronic  . Vt ablation 10/10    Current Outpatient Prescriptions  Medication Sig Dispense Refill  . fexofenadine (ALLEGRA) 180 MG tablet Take 180 mg by mouth at bedtime.      . fluticasone (FLONASE) 50 MCG/ACT nasal spray Place 2 sprays into the nose daily as needed.       Marland Kitchen KLOR-CON M10 10 MEQ tablet TAKE 4 TABLETS BY MOUTH 2 TIMES A DAY  250 tablet  3  . magnesium oxide (MAG-OX) 400 (241.3 MG) MG tablet TAKE 1 TABLET TWICE A DAY  60 tablet  6  . metoprolol succinate (TOPROL-XL) 25 MG 24 hr tablet TAKE ONE TABLET BY MOUTH AT BEDTIME  30 tablet  5  . montelukast (SINGULAIR) 10 MG tablet Take 10 mg by mouth at bedtime.       . Multiple Vitamin (MULITIVITAMIN WITH MINERALS) TABS Take 1 tablet by mouth daily.      . sotalol (BETAPACE) 120 MG tablet Take 1 & 1/2 tablets by mouth twice daily      . LORazepam (ATIVAN) 1 MG tablet Take 1 mg by mouth 2 (two) times daily as needed.      . [DISCONTINUED] KLOR-CON M10 10 MEQ tablet TAKE 4 TABLETS BY MOUTH 2 TIMES A DAY  250 tablet  3    Allergies  Allergen Reactions  . Lidocaine     [name]    Review of Systems negative  except from HPI and PMH  Physical Exam BP 126/80  Pulse 59  Resp 18  Ht 5\' 9"  (1.753 m)  Wt 196 lb 6.4 oz (89.086 kg)  BMI 29.00 kg/m2  SpO2 99% Well developed and nourished in no acute distress HENT normal Neck supple with JVP-flat Clear Device pocket well healed; without hematoma or erythema  Regular rate and rhythm, no murmurs or gallops Abd-soft with active BS No Clubbing cyanosis edema Skin-warm and dry A & Oriented  Grossly normal sensory and motor function  Electrocardiogram demonstrates sinus rhythm at 59 Intervals 19/09/43  Assessment and  Plan F

## 2012-08-20 ENCOUNTER — Telehealth: Payer: Self-pay | Admitting: Internal Medicine

## 2012-08-20 NOTE — Telephone Encounter (Signed)
New problem:    Patient had genetic testing done. Family was eval as well. Have general question.

## 2012-08-20 NOTE — Telephone Encounter (Signed)
Pt states that his Aunt, who needs Gene Dx testing, does not have a physician that will sign the order.  Wants to know if Dr. Graciela Husbands would sign the order.  Advised pt to bring the form to the office and we will look at it.  Pt agree.

## 2012-11-01 ENCOUNTER — Encounter: Payer: PRIVATE HEALTH INSURANCE | Admitting: *Deleted

## 2012-11-04 ENCOUNTER — Other Ambulatory Visit: Payer: Self-pay | Admitting: Internal Medicine

## 2012-11-08 ENCOUNTER — Telehealth: Payer: Self-pay | Admitting: *Deleted

## 2012-11-08 DIAGNOSIS — I472 Ventricular tachycardia, unspecified: Secondary | ICD-10-CM

## 2012-11-08 MED ORDER — METOPROLOL SUCCINATE ER 25 MG PO TB24: ORAL_TABLET | ORAL | Status: DC

## 2012-11-08 MED ORDER — POTASSIUM CHLORIDE CRYS ER 10 MEQ PO TBCR: EXTENDED_RELEASE_TABLET | ORAL | Status: DC

## 2012-11-08 MED ORDER — MAGNESIUM OXIDE 400 (241.3 MG) MG PO TABS: ORAL_TABLET | ORAL | Status: DC

## 2012-11-08 MED ORDER — SOTALOL HCL 120 MG PO TABS: ORAL_TABLET | ORAL | Status: DC

## 2012-11-08 NOTE — Telephone Encounter (Signed)
[facility] in [address] called for a refill on pt. Pt is taken Klor-com 10 meq, pt to take four tablets by mouth twice  DAY. Pharmacist to dispense 250 tablets and 6 refills.

## 2012-11-10 ENCOUNTER — Encounter: Payer: Self-pay | Admitting: *Deleted

## 2012-11-16 ENCOUNTER — Ambulatory Visit (INDEPENDENT_AMBULATORY_CARE_PROVIDER_SITE_OTHER): Payer: PRIVATE HEALTH INSURANCE | Admitting: *Deleted

## 2012-11-16 ENCOUNTER — Other Ambulatory Visit: Payer: Self-pay

## 2012-11-16 ENCOUNTER — Encounter: Payer: Self-pay | Admitting: Internal Medicine

## 2012-11-16 DIAGNOSIS — I472 Ventricular tachycardia: Secondary | ICD-10-CM

## 2012-11-16 DIAGNOSIS — Z95810 Presence of automatic (implantable) cardiac defibrillator: Secondary | ICD-10-CM

## 2012-11-16 DIAGNOSIS — I4729 Other ventricular tachycardia: Secondary | ICD-10-CM

## 2012-11-19 LAB — REMOTE ICD DEVICE
BATTERY VOLTAGE: 3.2139 V
BRDY-0002RV: 40 {beats}/min
CHARGE TIME: 8.738 s
DEV-0020ICD: NEGATIVE
FVT: 0
PACEART VT: 0
RV LEAD AMPLITUDE: 20 mv
RV LEAD IMPEDENCE ICD: 589 Ohm
RV LEAD THRESHOLD: 0.625 V
TOT-0001: 1
TOT-0002: 0
TOT-0006: 20130425000000
TZAT-0001FASTVT: 1
TZAT-0001SLOWVT: 1
TZAT-0001SLOWVT: 2
TZAT-0004FASTVT: 8
TZAT-0004SLOWVT: 8
TZAT-0004SLOWVT: 8
TZAT-0005FASTVT: 88 pct
TZAT-0005SLOWVT: 88 pct
TZAT-0005SLOWVT: 91 pct
TZAT-0011FASTVT: 10 ms
TZAT-0011SLOWVT: 10 ms
TZAT-0011SLOWVT: 10 ms
TZAT-0012FASTVT: 170 ms
TZAT-0012SLOWVT: 170 ms
TZAT-0012SLOWVT: 170 ms
TZAT-0013FASTVT: 1
TZAT-0013SLOWVT: 3
TZAT-0013SLOWVT: 3
TZAT-0018FASTVT: NEGATIVE
TZAT-0018SLOWVT: NEGATIVE
TZAT-0018SLOWVT: NEGATIVE
TZAT-0019FASTVT: 8 V
TZAT-0019SLOWVT: 8 V
TZAT-0019SLOWVT: 8 V
TZAT-0020FASTVT: 1.5 ms
TZAT-0020SLOWVT: 1.5 ms
TZAT-0020SLOWVT: 1.5 ms
TZON-0003FASTVT: 240 ms
TZON-0003SLOWVT: 360 ms
TZON-0003VSLOWVT: 450 ms
TZON-0004SLOWVT: 48
TZON-0004VSLOWVT: 52
TZON-0005SLOWVT: 12
TZST-0001FASTVT: 2
TZST-0001FASTVT: 3
TZST-0001FASTVT: 4
TZST-0001FASTVT: 5
TZST-0001FASTVT: 6
TZST-0001SLOWVT: 3
TZST-0001SLOWVT: 4
TZST-0001SLOWVT: 5
TZST-0001SLOWVT: 6
TZST-0003FASTVT: 35 J
TZST-0003FASTVT: 35 J
TZST-0003FASTVT: 35 J
TZST-0003FASTVT: 35 J
TZST-0003FASTVT: 35 J
TZST-0003SLOWVT: 35 J
TZST-0003SLOWVT: 35 J
TZST-0003SLOWVT: 35 J
TZST-0003SLOWVT: 35 J
VENTRICULAR PACING ICD: 0.19 pct
VF: 0

## 2012-11-27 ENCOUNTER — Other Ambulatory Visit: Payer: Self-pay | Admitting: Internal Medicine

## 2012-12-08 ENCOUNTER — Encounter: Payer: Self-pay | Admitting: *Deleted

## 2013-01-13 ENCOUNTER — Other Ambulatory Visit (HOSPITAL_COMMUNITY): Payer: Self-pay | Admitting: Internal Medicine

## 2013-01-20 ENCOUNTER — Ambulatory Visit (INDEPENDENT_AMBULATORY_CARE_PROVIDER_SITE_OTHER): Payer: PRIVATE HEALTH INSURANCE | Admitting: Internal Medicine

## 2013-01-20 ENCOUNTER — Encounter: Payer: Self-pay | Admitting: Internal Medicine

## 2013-01-20 VITALS — BP 122/83 | HR 52 | Ht 70.0 in | Wt 196.0 lb

## 2013-01-20 DIAGNOSIS — I4729 Other ventricular tachycardia: Secondary | ICD-10-CM

## 2013-01-20 DIAGNOSIS — I429 Cardiomyopathy, unspecified: Secondary | ICD-10-CM

## 2013-01-20 DIAGNOSIS — F4310 Post-traumatic stress disorder, unspecified: Secondary | ICD-10-CM

## 2013-01-20 DIAGNOSIS — I472 Ventricular tachycardia: Secondary | ICD-10-CM

## 2013-01-20 DIAGNOSIS — Z95810 Presence of automatic (implantable) cardiac defibrillator: Secondary | ICD-10-CM

## 2013-01-20 LAB — ICD DEVICE OBSERVATION
BATTERY VOLTAGE: 3.1935 V
BRDY-0002RV: 40 {beats}/min
DEV-0020ICD: NEGATIVE
FVT: 0
RV LEAD IMPEDENCE ICD: 589 Ohm
RV LEAD THRESHOLD: 0.75 V
TOT-0002: 0
TOT-0006: 20130425000000
TZAT-0001FASTVT: 1
TZAT-0012SLOWVT: 170 ms
TZAT-0012SLOWVT: 170 ms
TZAT-0013FASTVT: 1
TZAT-0013SLOWVT: 3
TZAT-0018FASTVT: NEGATIVE
TZAT-0018SLOWVT: NEGATIVE
TZAT-0019SLOWVT: 8 V
TZAT-0019SLOWVT: 8 V
TZAT-0020FASTVT: 1.5 ms
TZAT-0020SLOWVT: 1.5 ms
TZAT-0020SLOWVT: 1.5 ms
TZON-0003FASTVT: 240 ms
TZON-0003SLOWVT: 360 ms
TZON-0003VSLOWVT: 450 ms
TZST-0001FASTVT: 2
TZST-0001FASTVT: 4
TZST-0001FASTVT: 5
TZST-0001SLOWVT: 5
TZST-0003FASTVT: 35 J
TZST-0003FASTVT: 35 J
TZST-0003SLOWVT: 35 J
TZST-0003SLOWVT: 35 J
TZST-0003SLOWVT: 35 J

## 2013-01-20 LAB — BASIC METABOLIC PANEL
BUN: 7 mg/dL (ref 6–23)
Calcium: 9.8 mg/dL (ref 8.4–10.5)
GFR: 99.61 mL/min (ref >60.00)
Glucose, Bld: 86 mg/dL (ref 70–99)

## 2013-01-20 NOTE — Assessment & Plan Note (Signed)
Stable   Still struggels with some day

## 2013-01-20 NOTE — Assessment & Plan Note (Signed)
The patient's device was interrogated.  The information was reviewed. No changes were made in the programming.    

## 2013-01-20 NOTE — Progress Notes (Signed)
.  kc Patient Care Team: Faythe Ghee as PCP - General (Physician Assistant)   HPI  [name] is a 37 y.o. male Seen in followup for ventricular tachycardia in the setting of cardiomyopathy-arrhythmogenic. He is status post catheter ablation. He has had a couple of episodes of nonsustained VT which has been very difficult for him. At last visit we increased his sotalol  No significant palpitations ; major complaint is fatigue  He is continuing with counseling for his PTSD. Doing osome better    Gene testing has come back + for sister, - for mom   Past Medical History  Diagnosis Date  . Ventricular tachycardia     sotalol therapy; status post catheter ablation at [facility] 10/10  . Arrhythmogenic RV Cardiomyopathy      positive family history  . Anxiety   . ICD (implantable cardiac defibrillator) in place     Past Surgical History  Procedure Laterality Date  . Cardiac catheterization  06-01-06  . Cardiac defibrillator placement  06-03-06    Medtronic  . Vt ablation  10/10    Current Outpatient Prescriptions  Medication Sig Dispense Refill  . fexofenadine (ALLEGRA) 180 MG tablet Take 180 mg by mouth at bedtime.      . fluticasone (FLONASE) 50 MCG/ACT nasal spray Place 2 sprays into the nose daily as needed.       Marland Kitchen KLOR-CON M10 10 MEQ tablet TAKE 4 TABLETS BY MOUTH 2 TIMES A DAY  250 tablet  3  . LORazepam (ATIVAN) 1 MG tablet Take 1 mg by mouth 2 (two) times daily as needed.      . magnesium oxide (MAG-OX) 400 (241.3 MG) MG tablet TAKE 1 TABLET TWICE A DAY  60 tablet  6  . metoprolol succinate (TOPROL-XL) 25 MG 24 hr tablet TAKE ONE TABLET BY MOUTH AT BEDTIME  30 tablet  5  . montelukast (SINGULAIR) 10 MG tablet Take 10 mg by mouth at bedtime.       . Multiple Vitamin (MULITIVITAMIN WITH MINERALS) TABS Take 1 tablet by mouth daily.      . sotalol (BETAPACE) 120 MG tablet Take 1 & 1/2 tablets by mouth twice daily  90 tablet  11   No current facility-administered  medications for this visit.    Allergies  Allergen Reactions  . Lidocaine     [name]    Review of Systems negative except from HPI and PMH  Physical Exam BP 122/83  Pulse 52  Ht 5\' 10"  (1.778 m)  Wt 196 lb (88.905 kg)  BMI 28.12 kg/m2 Well developed and nourished in no acute distress HENT normal Neck supple with JVP-flat Clear Regular rate and rhythm, no murmurs or gallops Abd-soft with active BS No Clubbing cyanosis edema Skin-warm and dry A & Oriented  Grossly normal sensory and motor function  ECG demonstrates sinus rhythm at 52 intervals 20/10/46 axis is leftward at T-wave inversions V1-V2   Assessment and  Plan

## 2013-01-20 NOTE — Assessment & Plan Note (Addendum)
No intercurrent sustained Ventricular tachycardia Continue sotalol. witll check K adn MG

## 2013-01-20 NOTE — Assessment & Plan Note (Signed)
Continues current meds

## 2013-01-20 NOTE — Patient Instructions (Signed)
Your physician recommends that you have lab work today: bmp/magnesium  Remote monitoring is used to monitor your Pacemaker of ICD from home. This monitoring reduces the number of office visits required to check your device to one time per year. It allows [place] to keep an eye on the functioning of your device to ensure it is working properly. You are scheduled for a device check from home on 04/25/13. You may send your transmission at any time that day. If you have a wireless device, the transmission will be sent automatically. After your physician reviews your transmission, you will receive a postcard with your next transmission date.   Your physician wants you to follow-up in: 6 months with Dr. Graciela Husbands. You will receive a reminder letter in the mail two months in advance. If you don't receive a letter, please call our office to schedule the follow-up appointment.

## 2013-01-24 ENCOUNTER — Other Ambulatory Visit: Payer: Self-pay | Admitting: *Deleted

## 2013-01-24 MED ORDER — METOPROLOL SUCCINATE ER 25 MG PO TB24: ORAL_TABLET | ORAL | Status: DC

## 2013-04-20 ENCOUNTER — Other Ambulatory Visit: Payer: Self-pay

## 2013-04-20 DIAGNOSIS — I472 Ventricular tachycardia, unspecified: Secondary | ICD-10-CM

## 2013-04-20 MED ORDER — MAGNESIUM OXIDE 400 (241.3 MG) MG PO TABS: ORAL_TABLET | ORAL | Status: DC

## 2013-04-20 MED ORDER — SOTALOL HCL 120 MG PO TABS: ORAL_TABLET | ORAL | Status: DC

## 2013-04-20 MED ORDER — METOPROLOL SUCCINATE ER 25 MG PO TB24
ORAL_TABLET | ORAL | Status: DC
Start: 2013-04-20 — End: 2013-08-19

## 2013-04-25 ENCOUNTER — Ambulatory Visit (INDEPENDENT_AMBULATORY_CARE_PROVIDER_SITE_OTHER): Payer: PRIVATE HEALTH INSURANCE | Admitting: *Deleted

## 2013-04-25 DIAGNOSIS — I472 Ventricular tachycardia, unspecified: Secondary | ICD-10-CM

## 2013-04-25 DIAGNOSIS — I4729 Other ventricular tachycardia: Secondary | ICD-10-CM

## 2013-04-26 ENCOUNTER — Other Ambulatory Visit: Payer: Self-pay | Admitting: *Deleted

## 2013-04-26 MED ORDER — POTASSIUM CHLORIDE CRYS ER 10 MEQ PO TBCR
EXTENDED_RELEASE_TABLET | ORAL | Status: DC
Start: 2013-04-26 — End: 2013-06-25

## 2013-04-28 LAB — REMOTE ICD DEVICE
BATTERY VOLTAGE: 3.1866 V
BRDY-0002RV: 40 {beats}/min
CHARGE TIME: 8.998 s
RV LEAD AMPLITUDE: 20 mv
TOT-0002: 0
TOT-0006: 20130425000000
TZAT-0001SLOWVT: 1
TZAT-0001SLOWVT: 2
TZAT-0004SLOWVT: 8
TZAT-0004SLOWVT: 8
TZAT-0005FASTVT: 88 pct
TZAT-0005SLOWVT: 91 pct
TZAT-0011FASTVT: 10 ms
TZAT-0012FASTVT: 170 ms
TZAT-0012SLOWVT: 170 ms
TZAT-0012SLOWVT: 170 ms
TZAT-0013FASTVT: 1
TZAT-0013SLOWVT: 3
TZAT-0013SLOWVT: 3
TZAT-0018FASTVT: NEGATIVE
TZAT-0018SLOWVT: NEGATIVE
TZAT-0018SLOWVT: NEGATIVE
TZAT-0019FASTVT: 8 V
TZAT-0020SLOWVT: 1.5 ms
TZON-0003FASTVT: 240 ms
TZON-0003SLOWVT: 360 ms
TZON-0004SLOWVT: 48
TZON-0005SLOWVT: 12
TZST-0001FASTVT: 3
TZST-0001FASTVT: 5
TZST-0001FASTVT: 6
TZST-0001SLOWVT: 6
TZST-0003FASTVT: 35 J
TZST-0003FASTVT: 35 J
TZST-0003SLOWVT: 35 J
TZST-0003SLOWVT: 35 J
VENTRICULAR PACING ICD: 0.39 pct

## 2013-05-23 ENCOUNTER — Encounter: Payer: Self-pay | Admitting: *Deleted

## 2013-05-23 ENCOUNTER — Other Ambulatory Visit: Payer: Self-pay | Admitting: Internal Medicine

## 2013-06-23 ENCOUNTER — Other Ambulatory Visit: Payer: Self-pay

## 2013-06-25 ENCOUNTER — Other Ambulatory Visit: Payer: Self-pay | Admitting: Internal Medicine

## 2013-06-27 ENCOUNTER — Encounter: Payer: Self-pay | Admitting: Internal Medicine

## 2013-08-19 ENCOUNTER — Encounter: Payer: Self-pay | Admitting: Internal Medicine

## 2013-08-19 ENCOUNTER — Ambulatory Visit (INDEPENDENT_AMBULATORY_CARE_PROVIDER_SITE_OTHER): Payer: PRIVATE HEALTH INSURANCE | Admitting: Internal Medicine

## 2013-08-19 VITALS — BP 120/88 | HR 54 | Ht 70.0 in | Wt 201.0 lb

## 2013-08-19 DIAGNOSIS — I429 Cardiomyopathy, unspecified: Secondary | ICD-10-CM

## 2013-08-19 DIAGNOSIS — I4729 Other ventricular tachycardia: Secondary | ICD-10-CM

## 2013-08-19 DIAGNOSIS — I472 Ventricular tachycardia, unspecified: Secondary | ICD-10-CM

## 2013-08-19 DIAGNOSIS — Z95810 Presence of automatic (implantable) cardiac defibrillator: Secondary | ICD-10-CM

## 2013-08-19 DIAGNOSIS — N529 Male erectile dysfunction, unspecified: Secondary | ICD-10-CM | POA: Insufficient documentation

## 2013-08-19 LAB — MDC_IDC_ENUM_SESS_TYPE_INCLINIC
Battery Voltage: 3.17 V
Brady Statistic RV Percent Paced: 0.53 %
HIGH POWER IMPEDANCE MEASURED VALUE: 456 Ohm
HighPow Impedance: 171 Ohm
HighPow Impedance: 45 Ohm
HighPow Impedance: 77 Ohm
Lead Channel Impedance Value: 532 Ohm
Lead Channel Pacing Threshold Amplitude: 0.625 V
Lead Channel Setting Pacing Amplitude: 2 V
Lead Channel Setting Pacing Pulse Width: 0.4 ms
Lead Channel Setting Sensing Sensitivity: 1.2 mV
MDC IDC MSMT LEADCHNL RV PACING THRESHOLD PULSEWIDTH: 0.4 ms
MDC IDC MSMT LEADCHNL RV SENSING INTR AMPL: 23.375 mV
MDC IDC MSMT LEADCHNL RV SENSING INTR AMPL: 25 mV
MDC IDC SESS DTM: 20150102101434
MDC IDC SET ZONE DETECTION INTERVAL: 300 ms
Zone Setting Detection Interval: 240 ms
Zone Setting Detection Interval: 360 ms
Zone Setting Detection Interval: 450 ms

## 2013-08-19 LAB — BASIC METABOLIC PANEL
BUN: 9 mg/dL (ref 6–23)
CO2: 30 meq/L (ref 19–32)
Calcium: 9.2 mg/dL (ref 8.4–10.5)
Chloride: 103 mEq/L (ref 96–112)
Creatinine, Ser: 0.9 mg/dL (ref 0.4–1.5)
GFR: 107.31 mL/min (ref >60.00)
Glucose, Bld: 94 mg/dL (ref 70–99)
Potassium: 4.2 mEq/L (ref 3.5–5.1)
SODIUM: 139 meq/L (ref 135–145)

## 2013-08-19 LAB — MAGNESIUM: MAGNESIUM: 1.9 mg/dL (ref 1.5–2.5)

## 2013-08-19 MED ORDER — SOTALOL HCL 120 MG PO TABS: ORAL_TABLET | ORAL | Status: DC

## 2013-08-19 MED ORDER — METOPROLOL SUCCINATE ER 25 MG PO TB24: ORAL_TABLET | ORAL | Status: DC

## 2013-08-19 MED ORDER — POTASSIUM CHLORIDE CRYS ER 10 MEQ PO TBCR: EXTENDED_RELEASE_TABLET | ORAL | Status: DC

## 2013-08-19 MED ORDER — MAGNESIUM OXIDE 400 (241.3 MG) MG PO TABS: ORAL_TABLET | ORAL | Status: DC

## 2013-08-19 NOTE — Assessment & Plan Note (Signed)
nonsisustained VT but without Rx

## 2013-08-19 NOTE — Assessment & Plan Note (Signed)
Stable will check BMET and mag e

## 2013-08-19 NOTE — Progress Notes (Signed)
.  kc Patient Care Team: Versie Starks as PCP - General (Physician Assistant)   HPI  [name] is a 38 y.o. male Seen in followup for ventricular tachycardia in the setting of cardiomyopathy-arrhythmogenic. He is status post catheter ablation. He has had a couple of episodes of nonsustained VT which has been very difficult for him. At last visit we increased his sotalol  No significant palpitations ;   Struggling with erectile dysfunction  He is continuing with counseling for his PTSD. Doing osome better        Past Medical History  Diagnosis Date  . Ventricular tachycardia     sotalol therapy; status post catheter ablation at [facility] 10/10  . Arrhythmogenic RV Cardiomyopathy      positive family history  . Anxiety   . ICD (implantable cardiac defibrillator) in place     Past Surgical History  Procedure Laterality Date  . Cardiac catheterization  06-01-06  . Cardiac defibrillator placement  06-03-06    Medtronic  . Vt ablation  10/10    Current Outpatient Prescriptions  Medication Sig Dispense Refill  . fexofenadine (ALLEGRA) 180 MG tablet Take 180 mg by mouth at bedtime.      . fluticasone (FLONASE) 50 MCG/ACT nasal spray Place 2 sprays into the nose daily as needed.       Marland Kitchen KLOR-CON M10 10 MEQ tablet TAKE 4 TABLETS BY MOUTH 2 TIMES A DAY  250 tablet  1  . LORazepam (ATIVAN) 1 MG tablet Take 1 mg by mouth 2 (two) times daily as needed.      . magnesium oxide (MAG-OX) 400 (241.3 MG) MG tablet TAKE 1 TABLET TWICE A DAY  60 tablet  6  . metoprolol succinate (TOPROL-XL) 25 MG 24 hr tablet TAKE ONE TABLET BY MOUTH AT BEDTIME  30 tablet  6  . montelukast (SINGULAIR) 10 MG tablet Take 10 mg by mouth at bedtime.       . Multiple Vitamin (MULITIVITAMIN WITH MINERALS) TABS Take 1 tablet by mouth daily.      . sotalol (BETAPACE) 120 MG tablet Take 1 & 1/2 tablets by mouth twice daily  45 tablet  6   No current facility-administered medications for this visit.     Allergies  Allergen Reactions  . Lidocaine     [name]    Review of Systems negative except from HPI and PMH  Physical Exam BP 120/88  Pulse 54  Ht 5\' 10"  (1.778 m)  Wt 201 lb (91.173 kg)  BMI 28.84 kg/m2 Well developed and nourished in no acute distress HENT normal Neck supple with JVP-flat Clear Regular rate and rhythm, no murmurs or gallops Abd-soft with active BS No Clubbing cyanosis edema Skin-warm and dry A & Oriented  Grossly normal sensory and motor function  ECG demonstrates sinus rhythm at 52 intervals 20/10/46 axis is leftward at T-wave inversions V1-V2   Assessment and  Plan

## 2013-08-19 NOTE — Assessment & Plan Note (Signed)
Will Rx viagara 25  Can uptitrate to 50 if necessary

## 2013-08-19 NOTE — Patient Instructions (Addendum)
Your physician recommends that you have lab work today: Mg/BMET  Remote monitoring is used to monitor your Pacemaker of ICD from home. This monitoring reduces the number of office visits required to check your device to one time per year. It allows [place] to keep an eye on the functioning of your device to ensure it is working properly. You are scheduled for a device check from home on 11/21/2013. You may send your transmission at any time that day. If you have a wireless device, the transmission will be sent automatically. After your physician reviews your transmission, you will receive a postcard with your next transmission date.  Your physician wants you to follow-up in: 6 months with Dr. Caryl Comes.  You will receive a reminder letter in the mail two months in advance. If you don't receive a letter, please call our office to schedule the follow-up appointment.

## 2013-11-21 ENCOUNTER — Ambulatory Visit (INDEPENDENT_AMBULATORY_CARE_PROVIDER_SITE_OTHER): Payer: PRIVATE HEALTH INSURANCE | Admitting: *Deleted

## 2013-11-21 ENCOUNTER — Encounter: Payer: Self-pay | Admitting: Internal Medicine

## 2013-11-21 DIAGNOSIS — I472 Ventricular tachycardia: Secondary | ICD-10-CM

## 2013-11-21 DIAGNOSIS — I4729 Other ventricular tachycardia: Secondary | ICD-10-CM

## 2013-11-21 DIAGNOSIS — Z95810 Presence of automatic (implantable) cardiac defibrillator: Secondary | ICD-10-CM

## 2013-11-22 LAB — MDC_IDC_ENUM_SESS_TYPE_REMOTE
Battery Voltage: 3.18 V
Date Time Interrogation Session: 20150406200239
HIGH POWER IMPEDANCE MEASURED VALUE: 171 Ohm
HIGH POWER IMPEDANCE MEASURED VALUE: 46 Ohm
HIGH POWER IMPEDANCE MEASURED VALUE: 68 Ohm
HighPow Impedance: 513 Ohm
Lead Channel Impedance Value: 589 Ohm
Lead Channel Sensing Intrinsic Amplitude: 20.75 mV
Lead Channel Sensing Intrinsic Amplitude: 20.75 mV
Lead Channel Setting Sensing Sensitivity: 1.2 mV
MDC IDC MSMT LEADCHNL RV PACING THRESHOLD AMPLITUDE: 0.75 V
MDC IDC MSMT LEADCHNL RV PACING THRESHOLD PULSEWIDTH: 0.4 ms
MDC IDC SET LEADCHNL RV PACING AMPLITUDE: 2 V
MDC IDC SET LEADCHNL RV PACING PULSEWIDTH: 0.4 ms
MDC IDC SET ZONE DETECTION INTERVAL: 240 ms
MDC IDC SET ZONE DETECTION INTERVAL: 360 ms
MDC IDC STAT BRADY RV PERCENT PACED: 0.56 %
Zone Setting Detection Interval: 300 ms
Zone Setting Detection Interval: 450 ms

## 2013-11-25 ENCOUNTER — Encounter: Payer: Self-pay | Admitting: Internal Medicine

## 2013-11-30 ENCOUNTER — Encounter: Payer: Self-pay | Admitting: *Deleted

## 2014-02-21 ENCOUNTER — Ambulatory Visit (INDEPENDENT_AMBULATORY_CARE_PROVIDER_SITE_OTHER): Payer: BC Managed Care – PPO | Admitting: Family Medicine

## 2014-02-21 ENCOUNTER — Encounter: Payer: Self-pay | Admitting: Family Medicine

## 2014-02-21 VITALS — BP 118/64 | HR 57 | Temp 98.7°F | Ht 68.5 in | Wt 184.5 lb

## 2014-02-21 DIAGNOSIS — E781 Pure hyperglyceridemia: Secondary | ICD-10-CM | POA: Insufficient documentation

## 2014-02-21 DIAGNOSIS — K8020 Calculus of gallbladder without cholecystitis without obstruction: Secondary | ICD-10-CM

## 2014-02-21 DIAGNOSIS — E559 Vitamin D deficiency, unspecified: Secondary | ICD-10-CM

## 2014-02-21 DIAGNOSIS — R7401 Elevation of levels of liver transaminase levels: Secondary | ICD-10-CM

## 2014-02-21 DIAGNOSIS — R7402 Elevation of levels of lactic acid dehydrogenase (LDH): Secondary | ICD-10-CM

## 2014-02-21 DIAGNOSIS — J302 Other seasonal allergic rhinitis: Secondary | ICD-10-CM

## 2014-02-21 DIAGNOSIS — J309 Allergic rhinitis, unspecified: Secondary | ICD-10-CM | POA: Insufficient documentation

## 2014-02-21 DIAGNOSIS — J3089 Other allergic rhinitis: Secondary | ICD-10-CM

## 2014-02-21 DIAGNOSIS — R740 Nonspecific elevation of levels of transaminase and lactic acid dehydrogenase [LDH]: Secondary | ICD-10-CM

## 2014-02-21 DIAGNOSIS — E786 Lipoprotein deficiency: Secondary | ICD-10-CM

## 2014-02-21 NOTE — Assessment & Plan Note (Signed)
In the 20s Lab rev from last PCP at [place]/ [facility] Disc exercise to improve this He is currently walking and doing better with that Will continue to follow

## 2014-02-21 NOTE — Patient Instructions (Signed)
Vit D level today Work on low fat diet - for liver and cholesterol  Keep working on lifestyle change

## 2014-02-21 NOTE — Assessment & Plan Note (Signed)
Controlled with flonase, allegra and singulair

## 2014-02-21 NOTE — Progress Notes (Signed)
Subjective:    Patient ID: [name], male    DOB: 1975-08-19, 38 y.o.   MRN: [id number]  HPI Here to get est with primary care   Just started working at [employer] - works in different areas  Used to go to [employer]   Hx of Tanna Furry- hereditary - sees Dr Caryl Comes  In control with current therapy   Hx of sleep apnea treated with cpap  Does well with that   No hx of HTN   Hx of seasonal allergies - mostly year around -on allegra and flonase   Hx of vit D def - started therapy with vit D  Has been taking 50,000 IU weekly since May   Hx of fatty liver  He has lost some weight however - thinks he has lost about 10 lb in last mo  workin on his diet  Last GGT 75 , but ast/alt normal  Has ? Hx of gallstones   Last cholesterol LDL 72  Trig 295  HDL 28  Does some walking for exercise    Hx of PTSD - from ICD shocks in the past - takes a while to get back to normal after episodes Goes to pastoral care - monthly to help with stress reduction  Doing better with current job and keeping busy    Patient Active Problem List   Diagnosis Date Noted  . Hypertriglyceridemia 02/21/2014  . Low HDL (under 40) 02/21/2014  . Unspecified vitamin D deficiency 02/21/2014  . Erectile dysfunction 08/19/2013  . OSA (obstructive sleep apnea) 10/30/2011  . Hypokalemia 09/29/2011  . 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 ICD-Medtronic 01/10/2011  . 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 FATTY LIVER DISEASE 01/04/2009  . CHOLELITHIASIS 01/04/2009  . GERD 12/05/2008  . TRANSAMINASES, SERUM, ELEVATED 12/05/2008  . VENTRICULAR TACHYCARDIA, PAROXYSMAL 11/03/2008  . PARESTHESIA 11/03/2008  . ECZEMA 08/17/2007   Past Medical History  Diagnosis Date  . Ventricular tachycardia     sotalol therapy;  catheter ablation at [facility] 10/10 and [facility] 2013  . 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 Anxiety   . ICD  (implantable cardiac defibrillator) in place   . History of chicken pox    Past Surgical History  Procedure Laterality Date  . Cardiac catheterization  06-01-06  . Cardiac defibrillator placement  06-03-06    Medtronic  . Vt ablation  10/10   History  Substance Use Topics  . Smoking status: Former Smoker -- 1.00 packs/day for 15 years    Types: Cigarettes    Quit date: 08/19/2007  . Smokeless tobacco: Never Used  . Alcohol Use: No   Family History  Problem Relation Age of Onset  . Diabetes    . Stroke    . Heart attack    . Prostate cancer    . Breast cancer    . Ovarian cancer    . Uterine cancer    . Colon cancer    . Drug abuse    . Depression    . Asthma Mother   . Lung cancer Maternal Grandfather   . Heart disease Maternal Grandfather   . Heart disease Paternal Grandfather   . Hypertension Paternal Grandmother   . Sudden death Father    Allergies  Allergen Reactions  . Bee Venom   . Lidocaine     [name]   Current Outpatient Prescriptions on File Prior  to Visit  Medication Sig Dispense Refill  . fexofenadine (ALLEGRA) 180 MG tablet Take 180 mg by mouth at bedtime.      . fluticasone (FLONASE) 50 MCG/ACT nasal spray Place 2 sprays into the nose daily as needed.       Marland Kitchen LORazepam (ATIVAN) 1 MG tablet Take 1 mg by mouth 2 (two) times daily as needed.      . magnesium oxide (MAG-OX) 400 (241.3 MG) MG tablet TAKE 1 TABLET TWICE A DAY  60 tablet  11  . metoprolol succinate (TOPROL-XL) 25 MG 24 hr tablet TAKE ONE TABLET BY MOUTH AT BEDTIME  30 tablet  11  . montelukast (SINGULAIR) 10 MG tablet Take 10 mg by mouth at bedtime.       . Multiple Vitamin (MULITIVITAMIN WITH MINERALS) TABS Take 1 tablet by mouth daily.      . potassium chloride (KLOR-CON M10) 10 MEQ tablet Take 4 tablets by mouth 2 times a day.  240 tablet  11  . sotalol (BETAPACE) 120 MG tablet Take 1 & 1/2 tablets by mouth twice daily  90 tablet  11   No current facility-administered medications  on file prior to visit.    Review of Systems Review of Systems  Constitutional: Negative for fever, appetite change, fatigue and unexpected weight change.  Eyes: Negative for pain and visual disturbance.  ENt pos for sneezing and rhinorrhea  Respiratory: Negative for cough and shortness of breath.   Cardiovascular: Negative for cp or palpitations   (palpitations are on and off)  Gastrointestinal: Negative for nausea, diarrhea and constipation.  Genitourinary: Negative for urgency and frequency.  Skin: Negative for pallor or rash   Neurological: Negative for weakness, light-headedness, numbness and headaches.  Hematological: Negative for adenopathy. Does not bruise/bleed easily.  Psychiatric/Behavioral: Negative for dysphoric mood. The patient is at times  nervous/anxious.         Objective:   Physical Exam  Constitutional: He appears well-developed and well-nourished. No distress.  overwt and well appearing   HENT:  Head: Normocephalic and atraumatic.  Mouth/Throat: Oropharynx is clear and moist.  Eyes: Conjunctivae are normal. Pupils are equal, round, and reactive to light. No scleral icterus.  Neck: Normal range of motion. Neck supple. No JVD present. Carotid bruit is not present. No thyromegaly present.  Cardiovascular: Normal rate, regular rhythm and normal heart sounds.   Pulmonary/Chest: Effort normal and breath sounds normal. No respiratory distress. He has no wheezes. He has no rales.  Abdominal: Soft. Bowel sounds are normal. He exhibits no abdominal bruit.  Musculoskeletal: He exhibits no edema.  Lymphadenopathy:    He has no cervical adenopathy.  Neurological: He is alert. He has normal reflexes. He displays no tremor. No cranial nerve deficit. He exhibits normal muscle tone. Coordination normal.  Skin: Skin is warm and dry. No rash noted. No pallor.  Psychiatric: He has a normal mood and affect.          Assessment & Plan:   Problem List Items Addressed This  Visit     Other   Hypertriglyceridemia     Reviewed last lipids from [place]/ [facility] Disc low fat diet for both trig and also fatty liver  He is currently doing well and loosing wt with diet and exercise-enc to continue and will monitor    Low HDL (under 40)     In the 20s Lab rev from last PCP at [place]/ [facility] Disc exercise to improve this He is currently walking and doing  better with that Will continue to follow     Unspecified vitamin D deficiency - Primary     [facility] labs 5/15 - level of 18 Will re check this today since he started high dose weekly tx (50,000 iu) Thinks it does help his energy level  Disc outside time and balanced diet  Has had one fx -but not fragility (with significant trauma)    Relevant Orders      Vit D  25 hydroxy (rtn osteoporosis monitoring)

## 2014-02-21 NOTE — Assessment & Plan Note (Signed)
Reviewed last lipids from [place]/ [facility] Disc low fat diet for both trig and also fatty liver  He is currently doing well and loosing wt with diet and exercise-enc to continue and will monitor

## 2014-02-21 NOTE — Progress Notes (Signed)
Pre visit review using our clinic review tool, if applicable. No additional management support is needed unless otherwise documented below in the visit note. 

## 2014-02-21 NOTE — Assessment & Plan Note (Signed)
[facility] labs 5/15 - level of 18 Will re check this today since he started high dose weekly tx (50,000 iu) Thinks it does help his energy level  Disc outside time and balanced diet  Has had one fx -but not fragility (with significant trauma)

## 2014-02-22 ENCOUNTER — Telehealth: Payer: Self-pay | Admitting: Cardiology

## 2014-02-22 LAB — VITAMIN D 25 HYDROXY (VIT D DEFICIENCY, FRACTURES): VITD: 58.11 ng/mL

## 2014-02-22 NOTE — Telephone Encounter (Signed)
LMOVM informing pt to manual transmission due to updates and that if he has any questions to call me at [phone].

## 2014-03-09 ENCOUNTER — Encounter: Payer: Self-pay | Admitting: Internal Medicine

## 2014-03-09 ENCOUNTER — Ambulatory Visit (INDEPENDENT_AMBULATORY_CARE_PROVIDER_SITE_OTHER): Payer: BC Managed Care – PPO | Admitting: Internal Medicine

## 2014-03-09 VITALS — BP 114/70 | HR 61 | Ht 68.5 in | Wt 188.0 lb

## 2014-03-09 DIAGNOSIS — Z95810 Presence of automatic (implantable) cardiac defibrillator: Secondary | ICD-10-CM

## 2014-03-09 DIAGNOSIS — I428 Other cardiomyopathies: Secondary | ICD-10-CM

## 2014-03-09 DIAGNOSIS — I4729 Other ventricular tachycardia: Secondary | ICD-10-CM

## 2014-03-09 DIAGNOSIS — I472 Ventricular tachycardia: Secondary | ICD-10-CM

## 2014-03-09 LAB — MDC_IDC_ENUM_SESS_TYPE_INCLINIC
Battery Voltage: 3.15 V
Brady Statistic RV Percent Paced: 0.68 %
HIGH POWER IMPEDANCE MEASURED VALUE: 171 Ohm
HIGH POWER IMPEDANCE MEASURED VALUE: 44 Ohm
HIGH POWER IMPEDANCE MEASURED VALUE: 63 Ohm
HighPow Impedance: 475 Ohm
Lead Channel Impedance Value: 589 Ohm
Lead Channel Pacing Threshold Amplitude: 0.75 V
Lead Channel Sensing Intrinsic Amplitude: 20.625 mV
Lead Channel Setting Pacing Amplitude: 2 V
Lead Channel Setting Pacing Pulse Width: 0.4 ms
Lead Channel Setting Sensing Sensitivity: 1.2 mV
MDC IDC MSMT LEADCHNL RV PACING THRESHOLD PULSEWIDTH: 0.4 ms
MDC IDC MSMT LEADCHNL RV SENSING INTR AMPL: 19 mV
MDC IDC SESS DTM: 20150723155858
MDC IDC SET ZONE DETECTION INTERVAL: 240 ms
MDC IDC SET ZONE DETECTION INTERVAL: 360 ms
Zone Setting Detection Interval: 300 ms
Zone Setting Detection Interval: 450 ms

## 2014-03-09 NOTE — Progress Notes (Signed)
[name] Patient Care Team: Abner Greenspan, MD as PCP - General (Family Medicine)   HPI  [name] is a 38 y.o. male Seen in followup for ventricular tachycardia in the setting of cardiomyopathy-arrhythmogenic. He is status post catheter ablation. He has had a couple of episodes of nonsustained VT which has been very difficult for him. At last visit we increased his sotalol  No significant palpitations ;   Overall he is well   He is continuing with counseling for his PTSD. Doing some better        Past Medical History  Diagnosis Date  . Ventricular tachycardia     sotalol therapy;  catheter ablation at [facility] 10/10 and [facility] 2013  . 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 Anxiety   . ICD (implantable cardiac defibrillator) in place   . History of chicken pox     Past Surgical History  Procedure Laterality Date  . Cardiac catheterization  06-01-06  . Cardiac defibrillator placement  06-03-06    Medtronic  . Vt ablation  10/10    Current Outpatient Prescriptions  Medication Sig Dispense Refill  . Cetirizine HCl (ZYRTEC ALLERGY PO) Take by mouth daily.      . fluticasone (FLONASE) 50 MCG/ACT nasal spray Place 2 sprays into the nose daily as needed.       Marland Kitchen LORazepam (ATIVAN) 1 MG tablet Take 1 mg by mouth 2 (two) times daily as needed.      . magnesium oxide (MAG-OX) 400 (241.3 MG) MG tablet TAKE 1 TABLET TWICE A DAY  60 tablet  11  . metoprolol succinate (TOPROL-XL) 25 MG 24 hr tablet TAKE ONE TABLET BY MOUTH AT BEDTIME  30 tablet  11  . montelukast (SINGULAIR) 10 MG tablet Take 10 mg by mouth at bedtime.       . Multiple Vitamin (MULITIVITAMIN WITH MINERALS) TABS Take 1 tablet by mouth daily.      . potassium chloride (KLOR-CON M10) 10 MEQ tablet Take 4 tablets by mouth 2 times a day.  240 tablet  11  . sotalol (BETAPACE) 120 MG tablet Take 1 & 1/2 tablets by mouth twice daily  90 tablet  11  . Vitamin D, Ergocalciferol, (DRISDOL) 50000 UNITS  CAPS capsule Take 1 capsule by mouth once a week.       No current facility-administered medications for this visit.    Allergies  Allergen Reactions  . Bee Venom   . Lidocaine     [name]    Review of Systems negative except from HPI and PMH  Physical Exam BP 114/70  Pulse 61  Ht 5' 8.5" (1.74 m)  Wt 188 lb (85.276 kg)  BMI 28.17 kg/m2 Well developed and nourished in no acute distress HENT normal Neck supple with JVP-flat Clear Regular rate and rhythm, no murmurs or gallops Abd-soft with active BS No Clubbing cyanosis edema Skin-warm and dry A & Oriented  Grossly normal sensory and motor function  ECG demonstrates sinus rhythm at 52 intervals 20/10/46 axis is leftward at T-wave inversions V1-V2   Assessment and  Plan  ARVC-TMEM gene Positive  VT No intercurrent Ventricular tachycardia  ICD The patient's device was interrogated.  The information was reviewed. No changes were made in the programming.    PTSD  improved  The patient has had no recurrent ventricular tachycardia. He remains on sotalol. We will check his potassium and magnesium levels today.  [name]  is scheduled to get married finally in Oct  We discussed ongoing efforts to try to engage the extended family and gene testing. I've also suggested that they stated they would be in touch with the [facility] [place] [facility]

## 2014-03-09 NOTE — Patient Instructions (Signed)
Your physician recommends that you continue on your current medications as directed. Please refer to the Current Medication list given to you today.  Labs today: Magnesium, BMET  Remote monitoring is used to monitor your Pacemaker of ICD from home. This monitoring reduces the number of office visits required to check your device to one time per year. It allows [place] to keep an eye on the functioning of your device to ensure it is working properly. You are scheduled for a device check from home on 06/12/14. You may send your transmission at any time that day. If you have a wireless device, the transmission will be sent automatically. After your physician reviews your transmission, you will receive a postcard with your next transmission date.  Your physician wants you to follow-up in: 6 months with Dr. Caryl Comes. You will receive a reminder letter in the mail two months in advance. If you don't receive a letter, please call our office to schedule the follow-up appointment.

## 2014-03-10 LAB — BASIC METABOLIC PANEL
BUN: 12 mg/dL (ref 6–23)
CALCIUM: 9.4 mg/dL (ref 8.4–10.5)
CO2: 33 mEq/L — ABNORMAL HIGH (ref 19–32)
CREATININE: 0.8 mg/dL (ref 0.4–1.5)
Chloride: 104 mEq/L (ref 96–112)
GFR: 111.45 mL/min (ref >60.00)
Glucose, Bld: 79 mg/dL (ref 70–99)
Potassium: 4.3 mEq/L (ref 3.5–5.1)
SODIUM: 141 meq/L (ref 135–145)

## 2014-03-10 LAB — MAGNESIUM: MAGNESIUM: 1.9 mg/dL (ref 1.5–2.5)

## 2014-05-25 ENCOUNTER — Other Ambulatory Visit: Payer: Self-pay

## 2014-05-25 MED ORDER — FLUTICASONE PROPIONATE 50 MCG/ACT NA SUSP: NASAL | Status: DC

## 2014-05-25 NOTE — Telephone Encounter (Signed)
Pt left note requesting refill flonase to [facility].V/m left for pt refill sent to [facility].

## 2014-06-12 ENCOUNTER — Encounter: Payer: Self-pay | Admitting: Internal Medicine

## 2014-06-12 ENCOUNTER — Encounter: Payer: BC Managed Care – PPO | Admitting: *Deleted

## 2014-06-12 ENCOUNTER — Telehealth: Payer: Self-pay | Admitting: Cardiology

## 2014-06-12 NOTE — Telephone Encounter (Signed)
Spoke with pt and reminded pt of remote transmission that is due today. Pt verbalized understanding.   

## 2014-06-14 ENCOUNTER — Telehealth: Payer: Self-pay | Admitting: Cardiology

## 2014-06-14 NOTE — Telephone Encounter (Signed)
Pt called and stated that he contacted medtronic about his home monitor not sending transmission. He stated that he was informed that monitor is on back order and that it could be a significant amount of time before he gets a new monitor. Pt agreed to an appt with device clinic on 11-5 at 4:00 PM.

## 2014-06-20 ENCOUNTER — Ambulatory Visit (INDEPENDENT_AMBULATORY_CARE_PROVIDER_SITE_OTHER): Payer: BC Managed Care – PPO | Admitting: Internal Medicine

## 2014-06-20 ENCOUNTER — Encounter: Payer: Self-pay | Admitting: Internal Medicine

## 2014-06-20 VITALS — BP 118/80 | HR 65 | Temp 99.1°F | Wt 181.0 lb

## 2014-06-20 DIAGNOSIS — J069 Acute upper respiratory infection, unspecified: Secondary | ICD-10-CM

## 2014-06-20 DIAGNOSIS — B9789 Other viral agents as the cause of diseases classified elsewhere: Principal | ICD-10-CM

## 2014-06-20 MED ORDER — HYDROCODONE-HOMATROPINE 5-1.5 MG/5ML PO SYRP
5.0000 mL | ORAL_SOLUTION | Freq: Three times a day (TID) | ORAL | Status: DC | PRN
Start: 2014-06-20 — End: 2014-09-21

## 2014-06-20 NOTE — Progress Notes (Signed)
Pre visit review using our clinic review tool, if applicable. No additional management support is needed unless otherwise documented below in the visit note. 

## 2014-06-20 NOTE — Patient Instructions (Signed)
Upper Respiratory Infection, Adult An upper respiratory infection (URI) is also sometimes known as the common cold. The upper respiratory tract includes the nose, sinuses, throat, trachea, and bronchi. Bronchi are the airways leading to the lungs. Most people improve within 1 week, but symptoms can last up to 2 weeks. A residual cough may last even longer.  CAUSES Many different viruses can infect the tissues lining the upper respiratory tract. The tissues become irritated and inflamed and often become very moist. Mucus production is also common. A cold is contagious. You can easily spread the virus to others by oral contact. This includes kissing, sharing a glass, coughing, or sneezing. Touching your mouth or nose and then touching a surface, which is then touched by another person, can also spread the virus. SYMPTOMS  Symptoms typically develop 1 to 3 days after you come in contact with a cold virus. Symptoms vary from person to person. They may include:  Runny nose.  Sneezing.  Nasal congestion.  Sinus irritation.  Sore throat.  Loss of voice (laryngitis).  Cough.  Fatigue.  Muscle aches.  Loss of appetite.  Headache.  Low-grade fever. DIAGNOSIS  You might diagnose your own cold based on familiar symptoms, since most people get a cold 2 to 3 times a year. Your caregiver can confirm this based on your exam. Most importantly, your caregiver can check that your symptoms are not due to another disease such as strep throat, sinusitis, pneumonia, asthma, or epiglottitis. Blood tests, throat tests, and X-rays are not necessary to diagnose a common cold, but they may sometimes be helpful in excluding other more serious diseases. Your caregiver will decide if any further tests are required. RISKS AND COMPLICATIONS  You may be at risk for a more severe case of the common cold if you smoke cigarettes, have chronic heart disease (such as heart failure) or lung disease (such as asthma), or if  you have a weakened immune system. The very young and very old are also at risk for more serious infections. Bacterial sinusitis, middle ear infections, and bacterial pneumonia can complicate the common cold. The common cold can worsen asthma and chronic obstructive pulmonary disease (COPD). Sometimes, these complications can require emergency medical care and may be life-threatening. PREVENTION  The best way to protect against getting a cold is to practice good hygiene. Avoid oral or hand contact with people with cold symptoms. Wash your hands often if contact occurs. There is no clear evidence that vitamin C, vitamin E, echinacea, or exercise reduces the chance of developing a cold. However, it is always recommended to get plenty of rest and practice good nutrition. TREATMENT  Treatment is directed at relieving symptoms. There is no cure. Antibiotics are not effective, because the infection is caused by a virus, not by bacteria. Treatment may include:  Increased fluid intake. Sports drinks offer valuable electrolytes, sugars, and fluids.  Breathing heated mist or steam (vaporizer or shower).  Eating chicken soup or other clear broths, and maintaining good nutrition.  Getting plenty of rest.  Using gargles or lozenges for comfort.  Controlling fevers with ibuprofen or acetaminophen as directed by your caregiver.  Increasing usage of your inhaler if you have asthma. Zinc gel and zinc lozenges, taken in the first 24 hours of the common cold, can shorten the duration and lessen the severity of symptoms. Pain medicines may help with fever, muscle aches, and throat pain. A variety of non-prescription medicines are available to treat congestion and runny nose. Your caregiver   can make recommendations and may suggest nasal or lung inhalers for other symptoms.  HOME CARE INSTRUCTIONS   Only take over-the-counter or prescription medicines for pain, discomfort, or fever as directed by your  caregiver.  Use a warm mist humidifier or inhale steam from a shower to increase air moisture. This may keep secretions moist and make it easier to breathe.  Drink enough water and fluids to keep your urine clear or pale yellow.  Rest as needed.  Return to work when your temperature has returned to normal or as your caregiver advises. You may need to stay home longer to avoid infecting others. You can also use a face mask and careful hand washing to prevent spread of the virus. SEEK MEDICAL CARE IF:   After the first few days, you feel you are getting worse rather than better.  You need your caregiver's advice about medicines to control symptoms.  You develop chills, worsening shortness of breath, or brown or red sputum. These may be signs of pneumonia.  You develop yellow or brown nasal discharge or pain in the face, especially when you bend forward. These may be signs of sinusitis.  You develop a fever, swollen neck glands, pain with swallowing, or white areas in the back of your throat. These may be signs of strep throat. SEEK IMMEDIATE MEDICAL CARE IF:   You have a fever.  You develop severe or persistent headache, ear pain, sinus pain, or chest pain.  You develop wheezing, a prolonged cough, cough up blood, or have a change in your usual mucus (if you have chronic lung disease).  You develop sore muscles or a stiff neck. Document Released: 01/28/2001 Document Revised: 10/27/2011 Document Reviewed: 11/09/2013 ExitCare Patient Information 2015 [employer]. This information is not intended to replace advice given to you by your health care provider. Make sure you discuss any questions you have with your health care provider.  

## 2014-06-20 NOTE — Progress Notes (Signed)
HPI  Pt presents to the clinic today with c/o cough, chest congestion and sore throat. He reports this started 2 days ago. The cough is productive of thick yellow mucous. He denies fever, but has had chills and body aches. He does have a history of allergies- takes zyrtec, flonase and singulair. He has not tried anything else OTC. He reports this feels different. He has had sick contacts. He does not smoke.  Review of Systems      Past Medical History  Diagnosis Date  . Ventricular tachycardia     sotalol therapy;  catheter ablation at [facility] 10/10 and [facility] 2013  . 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 Anxiety   . ICD (implantable cardiac defibrillator) in place   . History of chicken pox     Family History  Problem Relation Age of Onset  . Diabetes    . Stroke    . Heart attack    . Prostate cancer    . Breast cancer    . Ovarian cancer    . Uterine cancer    . Colon cancer    . Drug abuse    . Depression    . Asthma Mother   . Lung cancer Maternal Grandfather   . Heart disease Maternal Grandfather   . Heart disease Paternal Grandfather   . Hypertension Paternal Grandmother   . Sudden death Father     History   Social History  . Marital Status: Single    Spouse Name: N/A    Number of Children: 0  . Years of Education: N/A   Occupational History  . 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 Smoking status: Former Smoker -- 1.00 packs/day for 15 years    Types: Cigarettes    Quit date: 08/19/2007  . Smokeless tobacco: Never Used  . Alcohol Use: No  . Drug Use: No  . Sexual Activity: Yes   Other Topics Concern  . Not on file   Social History Narrative   Works as a Audiological scientist    Allergies  Allergen Reactions  . Bee Venom   . Lidocaine     [name]     Constitutional:  Denies headache, fatigue, fever or abrupt weight changes.  HEENT:  Positive sore throat. Denies eye redness, eye pain, pressure behind the  eyes, facial pain, nasal congestion, ear pain, ringing in the ears, wax buildup, runny nose or bloody nose. Respiratory: Positive cough. Denies difficulty breathing or shortness of breath.  Cardiovascular: Denies chest pain, chest tightness, palpitations or swelling in the hands or feet.   No other specific complaints in a complete review of systems (except as listed in HPI above).  Objective:   BP 118/80 mmHg  Pulse 65  Temp(Src) 99.1 F (37.3 C) (Oral)  Wt 181 lb (82.101 kg)  SpO2 99% Wt Readings from Last 3 Encounters:  06/20/14 181 lb (82.101 kg)  03/09/14 188 lb (85.276 kg)  02/21/14 184 lb 8 oz (83.689 kg)     General: Appears his stated age, ill appearing  in NAD. HEENT: Head: normal shape and size;  Ears: Tm's gray and intact, normal light reflex; Nose: mucosa pink and moist, septum midline; Throat/Mouth:  Teeth present, mucosa erythematous and moist, no exudate noted, no lesions or ulcerations noted.  Neck: Mild cervical lymphadenopathy. Neck supple, trachea midline.  Cardiovascular: Normal rate and rhythm. S1,S2 noted.  No murmur, rubs or gallops noted.  Pulmonary/Chest: Normal effort and positive vesicular breath sounds. No respiratory distress. No wheezes, rales or ronchi noted.      Assessment & Plan:   Viral Upper Respiratory Infection with [place]:  Get some rest and drink plenty of water Do salt water gargles for the sore throat Rx for Hycodan cough syrup  If no improvement by Friday, call back, will call in antibiotic  RTC as needed or if symptoms persist.

## 2014-06-22 ENCOUNTER — Ambulatory Visit (INDEPENDENT_AMBULATORY_CARE_PROVIDER_SITE_OTHER): Payer: BC Managed Care – PPO | Admitting: *Deleted

## 2014-06-22 DIAGNOSIS — I428 Other cardiomyopathies: Secondary | ICD-10-CM

## 2014-06-22 DIAGNOSIS — I472 Ventricular tachycardia: Secondary | ICD-10-CM

## 2014-06-22 DIAGNOSIS — I4729 Other ventricular tachycardia: Secondary | ICD-10-CM

## 2014-06-22 DIAGNOSIS — I429 Cardiomyopathy, unspecified: Secondary | ICD-10-CM

## 2014-06-22 LAB — MDC_IDC_ENUM_SESS_TYPE_INCLINIC
Battery Voltage: 3.11 V
Brady Statistic RV Percent Paced: 1.57 %
HIGH POWER IMPEDANCE MEASURED VALUE: 171 Ohm
HighPow Impedance: 44 Ohm
HighPow Impedance: 475 Ohm
HighPow Impedance: 63 Ohm
Lead Channel Impedance Value: 513 Ohm
Lead Channel Pacing Threshold Pulse Width: 0.4 ms
Lead Channel Setting Pacing Amplitude: 2 V
Lead Channel Setting Pacing Pulse Width: 0.4 ms
Lead Channel Setting Sensing Sensitivity: 1.2 mV
MDC IDC MSMT LEADCHNL RV PACING THRESHOLD AMPLITUDE: 0.75 V
MDC IDC MSMT LEADCHNL RV SENSING INTR AMPL: 19 mV
MDC IDC MSMT LEADCHNL RV SENSING INTR AMPL: 19.875 mV
MDC IDC SESS DTM: 20151105161552
MDC IDC SET ZONE DETECTION INTERVAL: 300 ms
MDC IDC SET ZONE DETECTION INTERVAL: 360 ms
Zone Setting Detection Interval: 240 ms
Zone Setting Detection Interval: 450 ms

## 2014-06-22 NOTE — Progress Notes (Signed)
ICD check in clinic. Normal device function. Threshold and sensing consistent with previous device measurements. Impedance trends stable over time. 3 ventricular arrhythmias---max dur. 2 sec, Max Avg V 222. Histogram distribution appropriate for patient and level of activity. No changes made this session. Device programmed at appropriate safety margins. Device programmed to optimize intrinsic conduction. Batt voltage 3.11V (ERI 2.63V). Pt enrolled in remote follow-up. Plan to follow up with the [facility] on 2-4 @ 9:00am. Alert tones demonstrated for patient.

## 2014-07-19 ENCOUNTER — Encounter: Payer: Self-pay | Admitting: Internal Medicine

## 2014-07-27 ENCOUNTER — Encounter (HOSPITAL_COMMUNITY): Payer: Self-pay | Admitting: Internal Medicine

## 2014-07-28 ENCOUNTER — Other Ambulatory Visit: Payer: Self-pay | Admitting: Internal Medicine

## 2014-09-20 ENCOUNTER — Other Ambulatory Visit: Payer: Self-pay | Admitting: Family Medicine

## 2014-09-21 ENCOUNTER — Encounter: Payer: BC Managed Care – PPO | Admitting: Internal Medicine

## 2014-09-21 ENCOUNTER — Encounter: Payer: Self-pay | Admitting: Internal Medicine

## 2014-09-21 ENCOUNTER — Ambulatory Visit (INDEPENDENT_AMBULATORY_CARE_PROVIDER_SITE_OTHER): Payer: BC Managed Care – PPO | Admitting: Internal Medicine

## 2014-09-21 VITALS — BP 108/72 | HR 52 | Ht 69.5 in | Wt 191.6 lb

## 2014-09-21 DIAGNOSIS — I429 Cardiomyopathy, unspecified: Secondary | ICD-10-CM

## 2014-09-21 DIAGNOSIS — I472 Ventricular tachycardia: Secondary | ICD-10-CM

## 2014-09-21 DIAGNOSIS — Z4502 Encounter for adjustment and management of automatic implantable cardiac defibrillator: Secondary | ICD-10-CM

## 2014-09-21 DIAGNOSIS — I4729 Other ventricular tachycardia: Secondary | ICD-10-CM

## 2014-09-21 LAB — MDC_IDC_ENUM_SESS_TYPE_INCLINIC
Battery Voltage: 3.13 V
Brady Statistic RV Percent Paced: 0.83 %
HIGH POWER IMPEDANCE MEASURED VALUE: 171 Ohm
HIGH POWER IMPEDANCE MEASURED VALUE: 49 Ohm
HighPow Impedance: 418 Ohm
HighPow Impedance: 77 Ohm
Lead Channel Pacing Threshold Pulse Width: 0.4 ms
Lead Channel Sensing Intrinsic Amplitude: 19.75 mV
Lead Channel Sensing Intrinsic Amplitude: 20.875 mV
Lead Channel Setting Pacing Amplitude: 2 V
Lead Channel Setting Pacing Pulse Width: 0.4 ms
MDC IDC MSMT LEADCHNL RV IMPEDANCE VALUE: 513 Ohm
MDC IDC MSMT LEADCHNL RV PACING THRESHOLD AMPLITUDE: 0.625 V
MDC IDC SESS DTM: 20160204091847
MDC IDC SET LEADCHNL RV SENSING SENSITIVITY: 1.2 mV
MDC IDC SET ZONE DETECTION INTERVAL: 300 ms
MDC IDC SET ZONE DETECTION INTERVAL: 360 ms
Zone Setting Detection Interval: 240 ms
Zone Setting Detection Interval: 450 ms

## 2014-09-21 LAB — BASIC METABOLIC PANEL
BUN: 10 mg/dL (ref 6–23)
CHLORIDE: 104 meq/L (ref 96–112)
CO2: 31 mEq/L (ref 19–32)
Calcium: 9.6 mg/dL (ref 8.4–10.5)
Creatinine, Ser: 0.92 mg/dL (ref 0.40–1.50)
GFR: 98.67 mL/min (ref >60.00)
Glucose, Bld: 92 mg/dL (ref 70–99)
Potassium: 4.3 mEq/L (ref 3.5–5.1)
Sodium: 139 mEq/L (ref 135–145)

## 2014-09-21 LAB — MAGNESIUM: MAGNESIUM: 2.1 mg/dL (ref 1.5–2.5)

## 2014-09-21 NOTE — Progress Notes (Signed)
[name] Patient Care Team: Abner Greenspan, MD as PCP - General (Family Medicine)   HPI  [name] is a 39 y.o. male Seen in followup for ventricular tachycardia in the setting of cardiomyopathy-arrhythmogenic. He is status post catheter ablation. He has had a couple of episodes of nonsustained VT which has been very difficult for him. 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          Past Medical History  Diagnosis Date  . Ventricular tachycardia     sotalol therapy;  catheter ablation at [facility] 10/10 and [facility] 2013  . 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 Anxiety   . ICD (implantable cardiac defibrillator) in place   . History of chicken pox     Past Surgical History  Procedure Laterality Date  . Cardiac catheterization  06-01-06  . Cardiac defibrillator placement  06-03-06    Medtronic  . Vt ablation  10/10  . Implantable cardioverter defibrillator generator change N/A 12/11/2011    Procedure: IMPLANTABLE CARDIOVERTER DEFIBRILLATOR GENERATOR CHANGE;  Surgeon: Deboraha Sprang, MD;  Location: [facility] CATH LAB;  Service: Cardiovascular;  Laterality: N/A;    Current Outpatient Prescriptions  Medication Sig Dispense Refill  . Cetirizine HCl (ZYRTEC ALLERGY PO) Take 1 tablet by mouth daily.     . fexofenadine (ALLEGRA) 180 MG tablet Take 180 mg by mouth daily.    . fluticasone (FLONASE) 50 MCG/ACT nasal spray PLACE 2 SPRAYS INTO THE NOSE DAILY AS NEEDED. (Patient taking differently: PLACE 2 SPRAYS INTO THE NOSE DAILY AS NEEDED FOR ALLERGIES) 16 g 3  . guaiFENesin (MUCINEX) 600 MG 12 hr tablet Take 600 mg by mouth 2 (two) times daily.    Marland Kitchen KLOR-CON M10 10 MEQ tablet TAKE 4 TABLETS BY MOUTH 2 TIMES A DAY. 240 tablet 3  . LORazepam (ATIVAN) 1 MG tablet Take 1 mg by mouth 2 (two) times daily as needed for anxiety.     . magnesium oxide (MAG-OX) 400 (241.3  MG) MG tablet TAKE 1 TABLET TWICE A DAY (Patient taking differently: TAKE 1 TABLET BY MOUTH TWICE A DAY) 60 tablet 3  . metoprolol succinate (TOPROL-XL) 25 MG 24 hr tablet TAKE ONE TABLET BY MOUTH AT BEDTIME 30 tablet 3  . montelukast (SINGULAIR) 10 MG tablet Take 10 mg by mouth at bedtime.     . Multiple Vitamin (MULITIVITAMIN WITH MINERALS) TABS Take 1 tablet by mouth daily.    . sotalol (BETAPACE) 120 MG tablet TAKE 1 & 1/2 TABLETS BY MOUTH TWICE DAILY 90 tablet 3   No current facility-administered medications for this visit.    Allergies  Allergen Reactions  . Bee Venom Anaphylaxis  . Lidocaine     [name]    Review of Systems negative except from HPI and PMH  Physical Exam BP 108/72 mmHg  Pulse 52  Ht 5' 9.5" (1.765 m)  Wt 191 lb 9.6 oz (86.909 kg)  BMI 27.90 kg/m2 Well developed and nourished in no acute distress HENT normal Neck supple with JVP-flat Clear Regular rate and rhythm, no murmurs or gallops Abd-soft with active BS No Clubbing cyanosis edema Skin-warm and dry A & Oriented  Grossly normal sensory and motor function  ECG demonstrates sinus rhythm at 52 intervals 20/10/46 axis is leftward at T-wave inversions V1-V2  Assessment and  Plan  ARVC-TMEM gene Positive  VT No intercurrent Ventricular tachycardia  ICD The patient's device was interrogated.  The information was reviewed. No changes were made in the programming.    PTSD  Continues with improvement   The patient has had no recurrent ventricular tachycardia. He remains on sotalol. We will check his potassium and magnesium levels today.   He and his wife are planning on honeymoon

## 2014-09-21 NOTE — Patient Instructions (Addendum)
Your physician recommends that you continue on your current medications as directed. Please refer to the Current Medication list given to you today.  Labs today: BMET, Magnesium  Remote monitoring is used to monitor your Pacemaker of ICD from home. This monitoring reduces the number of office visits required to check your device to one time per year. It allows [place] to keep an eye on the functioning of your device to ensure it is working properly. You are scheduled for a device check from home on 12/21/14. You may send your transmission at any time that day. If you have a wireless device, the transmission will be sent automatically. After your physician reviews your transmission, you will receive a postcard with your next transmission date.  Your physician wants you to follow-up in: 6 months with Dr. Caryl Comes.  You will receive a reminder letter in the mail two months in advance. If you don't receive a letter, please call our office to schedule the follow-up appointment.

## 2014-11-27 ENCOUNTER — Other Ambulatory Visit: Payer: Self-pay | Admitting: Family Medicine

## 2014-12-12 ENCOUNTER — Other Ambulatory Visit: Payer: Self-pay | Admitting: Internal Medicine

## 2014-12-17 ENCOUNTER — Other Ambulatory Visit: Payer: Self-pay | Admitting: Internal Medicine

## 2014-12-18 ENCOUNTER — Other Ambulatory Visit: Payer: Self-pay | Admitting: Internal Medicine

## 2014-12-21 ENCOUNTER — Ambulatory Visit (INDEPENDENT_AMBULATORY_CARE_PROVIDER_SITE_OTHER): Payer: BC Managed Care – PPO | Admitting: *Deleted

## 2014-12-21 DIAGNOSIS — I4729 Other ventricular tachycardia: Secondary | ICD-10-CM

## 2014-12-21 DIAGNOSIS — I429 Cardiomyopathy, unspecified: Secondary | ICD-10-CM | POA: Diagnosis not present

## 2014-12-21 DIAGNOSIS — Z95810 Presence of automatic (implantable) cardiac defibrillator: Secondary | ICD-10-CM | POA: Diagnosis not present

## 2014-12-21 DIAGNOSIS — I472 Ventricular tachycardia: Secondary | ICD-10-CM

## 2014-12-24 LAB — CUP PACEART REMOTE DEVICE CHECK
Battery Voltage: 3.09 V
Brady Statistic RV Percent Paced: 1.19 %
Date Time Interrogation Session: 20160502202612
HIGH POWER IMPEDANCE MEASURED VALUE: 418 Ohm
HIGH POWER IMPEDANCE MEASURED VALUE: 48 Ohm
HighPow Impedance: 67 Ohm
Lead Channel Impedance Value: 513 Ohm
Lead Channel Pacing Threshold Amplitude: 0.75 V
Lead Channel Sensing Intrinsic Amplitude: 18.75 mV
Lead Channel Setting Pacing Amplitude: 2 V
Lead Channel Setting Pacing Pulse Width: 0.4 ms
MDC IDC MSMT LEADCHNL RV PACING THRESHOLD PULSEWIDTH: 0.4 ms
MDC IDC MSMT LEADCHNL RV SENSING INTR AMPL: 18.75 mV
MDC IDC SET LEADCHNL RV SENSING SENSITIVITY: 1.2 mV
MDC IDC SET ZONE DETECTION INTERVAL: 240 ms
MDC IDC SET ZONE DETECTION INTERVAL: 300 ms
MDC IDC SET ZONE DETECTION INTERVAL: 450 ms
Zone Setting Detection Interval: 360 ms

## 2015-01-08 ENCOUNTER — Encounter: Payer: Self-pay | Admitting: Cardiology

## 2015-01-17 ENCOUNTER — Encounter: Payer: Self-pay | Admitting: Internal Medicine

## 2015-01-17 ENCOUNTER — Other Ambulatory Visit: Payer: Self-pay | Admitting: Family Medicine

## 2015-01-17 NOTE — Telephone Encounter (Signed)
Please schedule a summer f/u and refill until then 

## 2015-01-17 NOTE — Telephone Encounter (Signed)
Pt had an establish care appt on 02/21/14 but no appt since then, please advise

## 2015-01-17 NOTE — Telephone Encounter (Signed)
appt scheduled an med refill

## 2015-01-29 ENCOUNTER — Encounter: Payer: Self-pay | Admitting: Family Medicine

## 2015-01-29 ENCOUNTER — Ambulatory Visit (INDEPENDENT_AMBULATORY_CARE_PROVIDER_SITE_OTHER): Payer: BC Managed Care – PPO | Admitting: Family Medicine

## 2015-01-29 VITALS — BP 112/74 | HR 53 | Temp 98.1°F | Ht 69.5 in | Wt 190.8 lb

## 2015-01-29 DIAGNOSIS — R740 Nonspecific elevation of levels of transaminase and lactic acid dehydrogenase [LDH]: Secondary | ICD-10-CM | POA: Diagnosis not present

## 2015-01-29 DIAGNOSIS — J302 Other seasonal allergic rhinitis: Secondary | ICD-10-CM | POA: Diagnosis not present

## 2015-01-29 DIAGNOSIS — B354 Tinea corporis: Secondary | ICD-10-CM

## 2015-01-29 DIAGNOSIS — E559 Vitamin D deficiency, unspecified: Secondary | ICD-10-CM

## 2015-01-29 DIAGNOSIS — E781 Pure hyperglyceridemia: Secondary | ICD-10-CM

## 2015-01-29 DIAGNOSIS — R7401 Elevation of levels of liver transaminase levels: Secondary | ICD-10-CM

## 2015-01-29 DIAGNOSIS — R7402 Elevation of levels of lactic acid dehydrogenase (LDH): Secondary | ICD-10-CM

## 2015-01-29 LAB — LIPID PANEL
CHOL/HDL RATIO: 6
Cholesterol: 178 mg/dL (ref 0–200)
HDL: 32.3 mg/dL — AB (ref >39.00)
NONHDL: 145.7
TRIGLYCERIDES: 371 mg/dL — AB (ref 0.0–149.0)
VLDL: 74.2 mg/dL — ABNORMAL HIGH (ref 0.0–40.0)

## 2015-01-29 LAB — AST: AST: 30 U/L (ref 0–37)

## 2015-01-29 LAB — ALT: ALT: 50 U/L (ref 0–53)

## 2015-01-29 LAB — VITAMIN D 25 HYDROXY (VIT D DEFICIENCY, FRACTURES): VITD: 29.44 ng/mL — ABNORMAL LOW (ref 30.00–100.00)

## 2015-01-29 LAB — LDL CHOLESTEROL, DIRECT: LDL DIRECT: 103 mg/dL

## 2015-01-29 MED ORDER — MONTELUKAST SODIUM 10 MG PO TABS
10.0000 mg | ORAL_TABLET | Freq: Every day | ORAL | Status: DC
Start: 2015-01-29 — End: 2015-11-08

## 2015-01-29 MED ORDER — FLUTICASONE PROPIONATE 50 MCG/ACT NA SUSP: NASAL | Status: DC

## 2015-01-29 NOTE — Progress Notes (Signed)
Pre visit review using our clinic review tool, if applicable. No additional management support is needed unless otherwise documented below in the visit note. 

## 2015-01-29 NOTE — Assessment & Plan Note (Signed)
2 small areas on chest  Tinea versicolor is also in the differential - but this is a small area Will tx with otc antifungal cream and update

## 2015-01-29 NOTE — Assessment & Plan Note (Addendum)
Due for a check Disc goals for lipids and reasons to control them Rev labs with pt-from last check  Rev low sat fat diet in detail  Lab today

## 2015-01-29 NOTE — Assessment & Plan Note (Signed)
Hx of fatty liver  Counseled on low fat diet/ wt loss and avoidance of acetaminophen and etoh Lab today

## 2015-01-29 NOTE — Patient Instructions (Addendum)
Lab today for cholesterol and vit D Pick up 2000 iu of vitamin D over the counter and take one daily  Avoid red meat/ fried foods/ egg yolks/ fatty breakfast meats/ butter, cheese and high fat dairy/ and shellfish   Get an antifungal cream over the counter (lamasil/tinactin)- try on the spots on your chest  Take care of yourself

## 2015-01-29 NOTE — Progress Notes (Signed)
Subjective:    Patient ID: [name], male    DOB: Apr 18, 1976, 39 y.o.   MRN: [id number]  HPI Here for f/u of chronic medical conditions   Wt is down 1 lb with bmi 27 Dropped 20 lb and put back on 10 lb per pt - then leveled off  Eats healthy-could do a bit better  Does exercise as much as he can   Heart issues are stable  Sees Dr Caryl Comes for that   Last labs were 5/15     Chemistry      Component Value Date/Time   NA 139 09/21/2014 1011   K 4.3 09/21/2014 1011   CL 104 09/21/2014 1011   CO2 31 09/21/2014 1011   BUN 10 09/21/2014 1011   CREATININE 0.92 09/21/2014 1011      Component Value Date/Time   CALCIUM 9.6 09/21/2014 1011   ALKPHOS 42 04/27/2009 0840   AST 46* 04/27/2009 0840   ALT 97* 04/27/2009 0840   BILITOT 1.0 04/27/2009 0840      Hx of fatty liver Watching it for years  Went up from last time  Is conscious of what he eats- sticking to lean meats  Does not drink alcohol No acetaminophen   Due for vit D test (was 20) Was supposed to take 50,000 iu weekly for about 3 months  Then levels came "back to normal" Last 1-2 months- took 1 pill per month   Due for cholesterol and triglycerides  Last trig 295 Is watching fried foods - doing better in the past year  Some pizza    Allergies  Seasonal - has been able to get by with singulair and flonase Stopped daily allegra - has not needed it   cpap at night helps a lot    Rash on chest- for 2 weeks  Not really itchy- started as pimples and stayed red  Thinks he has eczema   Patient Active Problem List   Diagnosis Date Noted  . Tinea corporis 01/29/2015  . Hypertriglyceridemia 02/21/2014  . Low HDL (under 40) 02/21/2014  . Vitamin D deficiency 02/21/2014  . Allergic rhinitis 02/21/2014  . Erectile dysfunction 08/19/2013  . OSA (obstructive sleep apnea) 10/30/2011  . Hypokalemia 09/29/2011  . 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 ICD-Medtronic 01/10/2011  . 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 FATTY LIVER DISEASE 01/04/2009  . Gallstones 01/04/2009  . GERD 12/05/2008  . TRANSAMINASES, SERUM, ELEVATED 12/05/2008  . VENTRICULAR TACHYCARDIA, PAROXYSMAL 11/03/2008  . PARESTHESIA 11/03/2008  . ECZEMA 08/17/2007   Past Medical History  Diagnosis Date  . Ventricular tachycardia     sotalol therapy;  catheter ablation at [facility] 10/10 and [facility] 2013  . 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 Anxiety   . ICD (implantable cardiac defibrillator) in place   . History of chicken pox    Past Surgical History  Procedure Laterality Date  . Cardiac catheterization  06-01-06  . Cardiac defibrillator placement  06-03-06    Medtronic  . Vt ablation  10/10  . Implantable cardioverter defibrillator generator change N/A 12/11/2011    Procedure: IMPLANTABLE CARDIOVERTER DEFIBRILLATOR GENERATOR CHANGE;  Surgeon: Deboraha Sprang, MD;  Location: [facility] CATH LAB;  Service: Cardiovascular;  Laterality: N/A;   History  Substance Use Topics  . Smoking status: Former Smoker -- 1.00 packs/day for 15 years    Types: Cigarettes    Quit date: 08/19/2007  .  Smokeless tobacco: Never Used  . Alcohol Use: No   Family History  Problem Relation Age of Onset  . Diabetes    . Stroke    . Heart attack    . Prostate cancer    . Breast cancer    . Ovarian cancer    . Uterine cancer    . Colon cancer    . Drug abuse    . Depression    . Asthma Mother   . Lung cancer Maternal Grandfather   . Heart disease Maternal Grandfather   . Heart disease Paternal Grandfather   . Hypertension Paternal Grandmother   . Sudden death Father    Allergies  Allergen Reactions  . Bee Venom Anaphylaxis  . Lidocaine     [name]   Current Outpatient Prescriptions on File Prior to Visit  Medication Sig Dispense Refill  . KLOR-CON M10 10 MEQ tablet TAKE 4 TABLETS BY MOUTH 2 TIMES A DAY. 240 tablet 6  . magnesium oxide (MAG-OX) 400 (241.3 MG) MG  tablet TAKE 1 TABLET TWICE A DAY 60 tablet 6  . metoprolol succinate (TOPROL-XL) 25 MG 24 hr tablet TAKE ONE TABLET BY MOUTH AT BEDTIME 30 tablet 3  . Multiple Vitamin (MULITIVITAMIN WITH MINERALS) TABS Take 1 tablet by mouth daily.    . sotalol (BETAPACE) 120 MG tablet TAKE 1 & 1/2 TABLETS BY MOUTH TWICE DAILY 90 tablet 3   No current facility-administered medications on file prior to visit.      Review of Systems     Objective:   Physical Exam  Constitutional: He appears well-developed and well-nourished. No distress.  Well appearing   HENT:  Head: Normocephalic and atraumatic.  Right Ear: External ear normal.  Left Ear: External ear normal.  Nose: Nose normal.  Mouth/Throat: Oropharynx is clear and moist.  Nares are boggy  Eyes: Conjunctivae and EOM are normal. Pupils are equal, round, and reactive to light. Right eye exhibits no discharge. Left eye exhibits no discharge. No scleral icterus.  Neck: Normal range of motion. Neck supple. No JVD present. Carotid bruit is not present. No thyromegaly present.  Cardiovascular: Normal rate, regular rhythm, normal heart sounds and intact distal pulses.  Exam reveals no gallop.   Pulmonary/Chest: Effort normal and breath sounds normal. No respiratory distress. He has no wheezes. He exhibits no tenderness.  Abdominal: Soft. Bowel sounds are normal. He exhibits no distension, no abdominal bruit and no mass. There is no tenderness.  Musculoskeletal: He exhibits no edema or tenderness.  Lymphadenopathy:    He has no cervical adenopathy.  Neurological: He is alert. He has normal reflexes. No cranial nerve deficit. He exhibits normal muscle tone. Coordination normal.  Skin: Skin is warm and dry. Rash noted. No erythema. No pallor.  Two 1-2 cm oval areas of pink scale on mid chest with central clearing   Psychiatric: He has a normal mood and affect.          Assessment & Plan:   Problem List Items Addressed This Visit    Allergic  rhinitis - Primary    Continues singulair and flonase Ok without antihistamine at this point  Disc allergen avoidance       Hypertriglyceridemia    Due for a check Disc goals for lipids and reasons to control them Rev labs with pt-from last check  Rev low sat fat diet in detail  Lab today      Relevant Orders   Lipid panel (Completed)  AST (Completed)   ALT (Completed)   Tinea corporis    2 small areas on chest  Tinea versicolor is also in the differential - but this is a small area Will tx with otc antifungal cream and update       TRANSAMINASES, SERUM, ELEVATED    Hx of fatty liver  Counseled on low fat diet/ wt loss and avoidance of acetaminophen and etoh Lab today      Relevant Orders   AST (Completed)   ALT (Completed)   Vitamin D deficiency    Lab today Supplementing orally Had high dose weekly tx  Will transition to oral daily doses 2000 iu       Relevant Orders   Vit D  25 hydroxy (rtn osteoporosis monitoring) (Completed)

## 2015-01-29 NOTE — Assessment & Plan Note (Signed)
Continues singulair and flonase Ok without antihistamine at this point  Disc allergen avoidance

## 2015-01-29 NOTE — Assessment & Plan Note (Signed)
Lab today Supplementing orally Had high dose weekly tx  Will transition to oral daily doses 2000 iu

## 2015-03-13 ENCOUNTER — Encounter: Payer: Self-pay | Admitting: *Deleted

## 2015-03-15 ENCOUNTER — Telehealth: Payer: Self-pay | Admitting: Internal Medicine

## 2015-03-15 NOTE — Telephone Encounter (Signed)
Scheduled patient to come in on 8/16 at 1:30 for f/u. Patient verbalized understanding and agreeable to plan.

## 2015-03-15 NOTE — Telephone Encounter (Signed)
New Message       Pt calling to schedule appt w/ Dr. Caryl Comes per recall, offered pt first available appt w/ Dr. Caryl Comes in October and pt states he needs to be seen no later than next month and that he doesn't want to see a PA. Please call back and advise.

## 2015-04-03 ENCOUNTER — Ambulatory Visit (INDEPENDENT_AMBULATORY_CARE_PROVIDER_SITE_OTHER): Payer: BC Managed Care – PPO | Admitting: Internal Medicine

## 2015-04-03 ENCOUNTER — Ambulatory Visit (INDEPENDENT_AMBULATORY_CARE_PROVIDER_SITE_OTHER): Payer: BC Managed Care – PPO | Admitting: *Deleted

## 2015-04-03 ENCOUNTER — Encounter: Payer: Self-pay | Admitting: Internal Medicine

## 2015-04-03 VITALS — BP 120/68 | HR 61 | Ht 69.5 in | Wt 193.4 lb

## 2015-04-03 DIAGNOSIS — I428 Other cardiomyopathies: Secondary | ICD-10-CM

## 2015-04-03 DIAGNOSIS — I472 Ventricular tachycardia: Secondary | ICD-10-CM | POA: Diagnosis not present

## 2015-04-03 DIAGNOSIS — Z4502 Encounter for adjustment and management of automatic implantable cardiac defibrillator: Secondary | ICD-10-CM

## 2015-04-03 DIAGNOSIS — Z79899 Other long term (current) drug therapy: Secondary | ICD-10-CM

## 2015-04-03 DIAGNOSIS — I4729 Other ventricular tachycardia: Secondary | ICD-10-CM

## 2015-04-03 LAB — BASIC METABOLIC PANEL
BUN: 15 mg/dL (ref 6–23)
CALCIUM: 9.5 mg/dL (ref 8.4–10.5)
CO2: 31 mEq/L (ref 19–32)
Chloride: 102 mEq/L (ref 96–112)
Creatinine, Ser: 0.85 mg/dL (ref 0.40–1.50)
GFR: 107.79 mL/min (ref >60.00)
Glucose, Bld: 91 mg/dL (ref 70–99)
POTASSIUM: 4 meq/L (ref 3.5–5.1)
SODIUM: 138 meq/L (ref 135–145)

## 2015-04-03 LAB — MAGNESIUM: MAGNESIUM: 2 mg/dL (ref 1.5–2.5)

## 2015-04-03 MED ORDER — SOTALOL HCL 120 MG PO TABS: ORAL_TABLET | ORAL | Status: DC

## 2015-04-03 MED ORDER — METOPROLOL SUCCINATE ER 25 MG PO TB24: 25.0000 mg | ORAL_TABLET | Freq: Every day | ORAL | Status: DC

## 2015-04-03 MED ORDER — POTASSIUM CHLORIDE CRYS ER 10 MEQ PO TBCR: EXTENDED_RELEASE_TABLET | ORAL | Status: DC

## 2015-04-03 MED ORDER — MAGNESIUM OXIDE 400 (241.3 MG) MG PO TABS: 1.0000 | ORAL_TABLET | Freq: Two times a day (BID) | ORAL | Status: DC

## 2015-04-03 NOTE — Progress Notes (Signed)
.  [facility] Patient Care Team: Abner Greenspan, MD as PCP - General (Family Medicine)   HPI  [name] is a 39 y.o. male Seen in followup for ventricular tachycardia in the setting of cardiomyopathy-arrhythmogenic. He is status post catheter ablation. He has had a couple of episodes of nonsustained VT which has been very difficult for him. 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          Past Medical History  Diagnosis Date  . Ventricular tachycardia     sotalol therapy;  catheter ablation at [facility] 10/10 and [facility] 2013  . 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 Anxiety   . ICD (implantable cardiac defibrillator) in place   . History of chicken pox     Past Surgical History  Procedure Laterality Date  . Cardiac catheterization  06-01-06  . Cardiac defibrillator placement  06-03-06    Medtronic  . Vt ablation  10/10  . Implantable cardioverter defibrillator generator change N/A 12/11/2011    Procedure: IMPLANTABLE CARDIOVERTER DEFIBRILLATOR GENERATOR CHANGE;  Surgeon: Deboraha Sprang, MD;  Location: [facility] CATH LAB;  Service: Cardiovascular;  Laterality: N/A;    Current Outpatient Prescriptions  Medication Sig Dispense Refill  . fluticasone (FLONASE) 50 MCG/ACT nasal spray PLACE 2 SPRAYS INTO THE NOSE DAILY AS NEEDED. 16 g 11  . KLOR-CON M10 10 MEQ tablet TAKE 4 TABLETS BY MOUTH 2 TIMES A DAY. 240 tablet 6  . magnesium oxide (MAG-OX) 400 (241.3 MG) MG tablet TAKE 1 TABLET TWICE A DAY 60 tablet 6  . metoprolol succinate (TOPROL-XL) 25 MG 24 hr tablet TAKE ONE TABLET BY MOUTH AT BEDTIME 30 tablet 3  . montelukast (SINGULAIR) 10 MG tablet Take 1 tablet (10 mg total) by mouth daily. 30 tablet 11  . Multiple Vitamin (MULITIVITAMIN WITH MINERALS) TABS Take 1 tablet by mouth daily.    . sotalol (BETAPACE) 120 MG tablet TAKE 1 & 1/2 TABLETS BY MOUTH TWICE DAILY  90 tablet 3  . VITAMIN D, ERGOCALCIFEROL, PO Take 1 tablet by mouth daily.     No current facility-administered medications for this visit.    Allergies  Allergen Reactions  . Bee Venom Anaphylaxis  . Lidocaine     [name]    Review of Systems negative except from HPI and PMH  Physical Exam BP 120/68 mmHg  Pulse 61  Ht 5' 9.5" (1.765 m)  Wt 193 lb 6.4 oz (87.726 kg)  BMI 28.16 kg/m2 Well developed and nourished in no acute distress HENT normal Neck supple with JVP-flat Clear Regular rate and rhythm, no murmurs or gallops Abd-soft with active BS No Clubbing cyanosis edema Skin-warm and dry A & Oriented  Grossly normal sensory and motor function  ECG demonstrates sinus rhythm at 52 intervals 22/09/46 axis is leftward at T-wave inversions V1-V2   Assessment and  Plan  ARVC-TMEM gene Positive  VT No intercurrent sustained Ventricular tachycardia; Some non sustained ICD The patient's device was interrogated.  The information was reviewed. No changes were made in the programming.    PTSD  Continues with improvement   The patient has had no recurrent ventricular tachycardia. He remains on sotalol. We will check his potassium and magnesium levels today.   He and his wife are planning on honeymoon

## 2015-04-03 NOTE — Patient Instructions (Addendum)
Medication Instructions:  Your physician recommends that you continue on your current medications as directed. Please refer to the Current Medication list given to you today.  Labwork: Medication surveillance labs: Magnesium & BMET  Testing/Procedures: None ordered  Follow-Up: Remote monitoring is used to monitor your Pacemaker of ICD from home. This monitoring reduces the number of office visits required to check your device to one time per year. It allows [place] to keep an eye on the functioning of your device to ensure it is working properly. You are scheduled for a device check from home on 07/03/15. You may send your transmission at any time that day. If you have a wireless device, the transmission will be sent automatically. After your physician reviews your transmission, you will receive a postcard with your next transmission date.  Your physician wants you to follow-up in: 6 months with Dr. Caryl Comes. You will receive a reminder letter in the mail two months in advance. If you don't receive a letter, please call our office to schedule the follow-up appointment.   Any Other Special Instructions Will Be Listed Below (If Applicable). Thank you for choosing [facility]!!

## 2015-04-06 LAB — CUP PACEART INCLINIC DEVICE CHECK
Battery Voltage: 3.1 V
Date Time Interrogation Session: 20160816181638
HIGH POWER IMPEDANCE MEASURED VALUE: 171 Ohm
HighPow Impedance: 399 Ohm
HighPow Impedance: 47 Ohm
HighPow Impedance: 72 Ohm
Lead Channel Impedance Value: 456 Ohm
Lead Channel Pacing Threshold Amplitude: 0.75 V
Lead Channel Pacing Threshold Pulse Width: 0.4 ms
Lead Channel Sensing Intrinsic Amplitude: 22 mV
MDC IDC MSMT LEADCHNL RV SENSING INTR AMPL: 21.25 mV
MDC IDC SET LEADCHNL RV PACING AMPLITUDE: 2 V
MDC IDC SET LEADCHNL RV PACING PULSEWIDTH: 0.4 ms
MDC IDC SET LEADCHNL RV SENSING SENSITIVITY: 1.2 mV
MDC IDC SET ZONE DETECTION INTERVAL: 300 ms
MDC IDC STAT BRADY RV PERCENT PACED: 0.81 %
Zone Setting Detection Interval: 240 ms
Zone Setting Detection Interval: 360 ms
Zone Setting Detection Interval: 450 ms

## 2015-04-06 NOTE — Progress Notes (Signed)
ICD check in clinic. Normal device function. Threshold and sensing consistent with previous device measurements. Impedance trends stable over time. (3) ventricular arrhythmias---max dur. 23 bts, Max Avg V 185.  Histogram distribution appropriate for patient and level of activity. Stable thoracic impedance. No changes made this session. Device programmed at appropriate safety margins. Device programmed to optimize intrinsic conduction. Batt voltage 3.10V (ERI 2.63V). Pt enrolled in remote follow-up. Plan to follow up via [facility] on 11-15 and with [facility] in 6 months. Patient education completed including shock plan. Alert tones demonstrated for patient.

## 2015-04-27 ENCOUNTER — Other Ambulatory Visit: Payer: Self-pay | Admitting: Internal Medicine

## 2015-05-08 ENCOUNTER — Other Ambulatory Visit: Payer: Self-pay

## 2015-05-08 MED ORDER — MAGNESIUM OXIDE 400 (241.3 MG) MG PO TABS: 1.0000 | ORAL_TABLET | Freq: Two times a day (BID) | ORAL | Status: DC

## 2015-05-08 NOTE — Telephone Encounter (Signed)
[name] Sprang, MD at 04/03/2015 2:33 PM  magnesium oxide (MAG-OX) 400 (241.3 MG) MG tablet TAKE 1 TABLET TWICE A DAY PTSD Continues with improvement   The patient has had no recurrent ventricular tachycardia. He remains on sotalol. We will check his potassium and magnesium levels today. Notes Recorded by [name] Sprang, MD on 04/04/2015 at 1:20 PM Please Inform Patient that labs are normal

## 2015-06-25 ENCOUNTER — Other Ambulatory Visit: Payer: Self-pay | Admitting: Internal Medicine

## 2015-10-08 NOTE — Progress Notes (Signed)
[name] Patient Care Team: Abner Greenspan, MD as PCP - General (Family Medicine)   HPI  [name] is a 40 y.o. male Seen in followup for ventricular tachycardia in the setting of cardiomyopathy-arrhythmogenic. He is status post catheter ablation. He has had a couple of episodes of nonsustained VT which has been very difficult for him. 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          Past Medical History  Diagnosis Date  . Ventricular tachycardia (HCC)     sotalol therapy;  catheter ablation at [facility] 10/10 and [facility] 2013  . 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 Anxiety   . ICD (implantable cardiac defibrillator) in place   . History of chicken pox     Past Surgical History  Procedure Laterality Date  . Cardiac catheterization  06-01-06  . Cardiac defibrillator placement  06-03-06    Medtronic  . Vt ablation  10/10  . Implantable cardioverter defibrillator generator change N/A 12/11/2011    Procedure: IMPLANTABLE CARDIOVERTER DEFIBRILLATOR GENERATOR CHANGE;  Surgeon: Deboraha Sprang, MD;  Location: [facility] CATH LAB;  Service: Cardiovascular;  Laterality: N/A;    Current Outpatient Prescriptions  Medication Sig Dispense Refill  . fluticasone (FLONASE) 50 MCG/ACT nasal spray PLACE 2 SPRAYS INTO THE NOSE DAILY AS NEEDED. 16 g 11  . magnesium oxide (MAG-OX) 400 (241.3 MG) MG tablet Take 1 tablet (400 mg total) by mouth 2 (two) times daily. 180 tablet 3  . metoprolol succinate (TOPROL-XL) 25 MG 24 hr tablet Take 1 tablet (25 mg total) by mouth at bedtime. 30 tablet 6  . montelukast (SINGULAIR) 10 MG tablet Take 1 tablet (10 mg total) by mouth daily. 30 tablet 11  . Multiple Vitamin (MULITIVITAMIN WITH MINERALS) TABS Take 1 tablet by mouth daily.    . potassium chloride (KLOR-CON M10) 10 MEQ tablet TAKE 4 TABLETS BY MOUTH 2 TIMES A DAY. 240 tablet 6  .  sotalol (BETAPACE) 120 MG tablet TAKE 1 & 1/2 TABLETS BY MOUTH TWICE DAILY 90 tablet 6  . VITAMIN D, ERGOCALCIFEROL, PO Take 1 tablet by mouth daily.     No current facility-administered medications for this visit.    Allergies  Allergen Reactions  . Bee Venom Anaphylaxis  . Lidocaine     [name]    Review of Systems negative except from HPI and PMH  Physical Exam BP 112/76 mmHg  Pulse 55  Ht 5\' 9"  (1.753 m)  Wt 195 lb 6.4 oz (88.633 kg)  BMI 28.84 kg/m2 Well developed and nourished in no acute distress HENT normal Neck supple with JVP-flat Clear Regular rate and rhythm, no murmurs or gallops Abd-soft with active BS No Clubbing cyanosis edema Skin-warm and dry A & Oriented  Grossly normal sensory and motor function  ECG demonstrates sinus rhythm at 52 intervals 22/09/46 axis is leftward at T-wave inversions V1-V2   Assessment and  Plan  ARVC-TMEM gene Positive  VT No intercurrent sustained Ventricular tachycardia; Some non sustained ICD The patient's device was interrogated.  The information was reviewed. No changes were made in the programming.    PTSD  Continues with improvement   The patient has had no recurrent sustained ventricular tachycardia. He remains on sotalol. We will check his potassium and magnesium levels today  He  is also concerned about his family. He has 2 first cousins father is incarcerated was not tested. I signed a letter today requesting the prison for him to have gene testing done.  In the event that this is not able to be done, we can gene test to cousins. There is also a grandson of the deceased proband  i.e. The patient's uncle.

## 2015-10-09 ENCOUNTER — Encounter: Payer: Self-pay | Admitting: Internal Medicine

## 2015-10-09 ENCOUNTER — Ambulatory Visit (INDEPENDENT_AMBULATORY_CARE_PROVIDER_SITE_OTHER): Payer: BC Managed Care – PPO | Admitting: Internal Medicine

## 2015-10-09 VITALS — BP 112/76 | HR 55 | Ht 69.0 in | Wt 195.4 lb

## 2015-10-09 DIAGNOSIS — Z95810 Presence of automatic (implantable) cardiac defibrillator: Secondary | ICD-10-CM | POA: Diagnosis not present

## 2015-10-09 DIAGNOSIS — I4729 Other ventricular tachycardia: Secondary | ICD-10-CM

## 2015-10-09 DIAGNOSIS — I428 Other cardiomyopathies: Secondary | ICD-10-CM

## 2015-10-09 DIAGNOSIS — I472 Ventricular tachycardia: Secondary | ICD-10-CM

## 2015-10-09 LAB — CUP PACEART INCLINIC DEVICE CHECK
Battery Voltage: 3.08 V
Brady Statistic RV Percent Paced: 0.81 %
HIGH POWER IMPEDANCE MEASURED VALUE: 456 Ohm
HIGH POWER IMPEDANCE MEASURED VALUE: 46 Ohm
HIGH POWER IMPEDANCE MEASURED VALUE: 73 Ohm
Implantable Lead Location: 753860
Implantable Lead Model: 6947
Lead Channel Impedance Value: 513 Ohm
Lead Channel Pacing Threshold Amplitude: 0.75 V
Lead Channel Pacing Threshold Pulse Width: 0.4 ms
Lead Channel Sensing Intrinsic Amplitude: 19.5 mV
Lead Channel Sensing Intrinsic Amplitude: 21.875 mV
Lead Channel Setting Pacing Amplitude: 2 V
MDC IDC LEAD IMPLANT DT: 20071018
MDC IDC SESS DTM: 20170221145340
MDC IDC SET LEADCHNL RV PACING PULSEWIDTH: 0.4 ms
MDC IDC SET LEADCHNL RV SENSING SENSITIVITY: 1.2 mV

## 2015-10-09 NOTE — Patient Instructions (Signed)
Medication Instructions: - Your physician recommends that you continue on your current medications as directed. Please refer to the Current Medication list given to you today.  Labwork: - Your physician recommends that you have lab work today: BMP/ Magnesium  Procedures/Testing: - none  Follow-Up: - Remote monitoring is used to monitor your Pacemaker of ICD from home. This monitoring reduces the number of office visits required to check your device to one time per year. It allows [place] to keep an eye on the functioning of your device to ensure it is working properly. You are scheduled for a device check from home on 01/08/16. You may send your transmission at any time that day. If you have a wireless device, the transmission will be sent automatically. After your physician reviews your transmission, you will receive a postcard with your next transmission date.  - Your physician wants you to follow-up in: 6 months with Chanetta Marshall, NP for Dr. Caryl Comes. You will receive a reminder letter in the mail two months in advance. If you don't receive a letter, please call our office to schedule the follow-up appointment.  Any Additional Special Instructions Will Be Listed Below (If Applicable).     If you need a refill on your cardiac medications before your next appointment, please call your pharmacy.

## 2015-10-10 LAB — BASIC METABOLIC PANEL
BUN: 9 mg/dL (ref 7–25)
CALCIUM: 9.2 mg/dL (ref 8.6–10.3)
CO2: 28 mmol/L (ref 20–31)
CREATININE: 0.76 mg/dL (ref 0.60–1.35)
Chloride: 101 mmol/L (ref 98–110)
GLUCOSE: 83 mg/dL (ref 65–99)
Potassium: 4.2 mmol/L (ref 3.5–5.3)
Sodium: 141 mmol/L (ref 135–146)

## 2015-10-10 LAB — MAGNESIUM: Magnesium: 1.8 mg/dL (ref 1.5–2.5)

## 2015-10-23 ENCOUNTER — Encounter: Payer: Self-pay | Admitting: Internal Medicine

## 2015-10-25 ENCOUNTER — Other Ambulatory Visit: Payer: Self-pay | Admitting: *Deleted

## 2015-10-25 DIAGNOSIS — G4733 Obstructive sleep apnea (adult) (pediatric): Secondary | ICD-10-CM

## 2015-11-06 ENCOUNTER — Other Ambulatory Visit: Payer: Self-pay | Admitting: Internal Medicine

## 2015-11-08 ENCOUNTER — Other Ambulatory Visit: Payer: Self-pay | Admitting: Family Medicine

## 2015-11-08 NOTE — Telephone Encounter (Signed)
done

## 2015-11-08 NOTE — Telephone Encounter (Signed)
Electronic refill request, no recent/future appt., please advise  

## 2015-11-08 NOTE — Telephone Encounter (Signed)
Please refill for a year  

## 2015-11-10 ENCOUNTER — Other Ambulatory Visit: Payer: Self-pay | Admitting: Internal Medicine

## 2016-01-08 ENCOUNTER — Ambulatory Visit (INDEPENDENT_AMBULATORY_CARE_PROVIDER_SITE_OTHER): Payer: BC Managed Care – PPO | Admitting: *Deleted

## 2016-01-08 DIAGNOSIS — Z95810 Presence of automatic (implantable) cardiac defibrillator: Secondary | ICD-10-CM | POA: Diagnosis not present

## 2016-01-08 DIAGNOSIS — I472 Ventricular tachycardia: Secondary | ICD-10-CM

## 2016-01-08 DIAGNOSIS — I4729 Other ventricular tachycardia: Secondary | ICD-10-CM

## 2016-01-08 NOTE — Progress Notes (Signed)
Remote ICD transmission.   

## 2016-01-27 LAB — CUP PACEART REMOTE DEVICE CHECK
Battery Voltage: 3.06 V
Brady Statistic RV Percent Paced: 0.8 %
HIGH POWER IMPEDANCE MEASURED VALUE: 361 Ohm
HIGH POWER IMPEDANCE MEASURED VALUE: 49 Ohm
HighPow Impedance: 79 Ohm
Lead Channel Impedance Value: 456 Ohm
Lead Channel Sensing Intrinsic Amplitude: 18.625 mV
Lead Channel Setting Pacing Amplitude: 2 V
Lead Channel Setting Pacing Pulse Width: 0.4 ms
Lead Channel Setting Sensing Sensitivity: 1.2 mV
MDC IDC LEAD IMPLANT DT: 20071018
MDC IDC LEAD LOCATION: 753860
MDC IDC MSMT LEADCHNL RV PACING THRESHOLD AMPLITUDE: 0.875 V
MDC IDC MSMT LEADCHNL RV PACING THRESHOLD PULSEWIDTH: 0.4 ms
MDC IDC MSMT LEADCHNL RV SENSING INTR AMPL: 18.625 mV
MDC IDC SESS DTM: 20170523135844

## 2016-02-02 ENCOUNTER — Other Ambulatory Visit: Payer: Self-pay | Admitting: Internal Medicine

## 2016-02-02 ENCOUNTER — Other Ambulatory Visit: Payer: Self-pay | Admitting: Family Medicine

## 2016-02-04 NOTE — Telephone Encounter (Signed)
Pt hasn't had and appt in over a year and no future appt., please advise

## 2016-02-04 NOTE — Telephone Encounter (Signed)
Please f/u late summer with labs prior

## 2016-02-05 ENCOUNTER — Encounter: Payer: Self-pay | Admitting: Cardiology

## 2016-02-05 NOTE — Telephone Encounter (Signed)
appts scheduled and med refilled  

## 2016-02-07 ENCOUNTER — Ambulatory Visit (INDEPENDENT_AMBULATORY_CARE_PROVIDER_SITE_OTHER): Payer: BC Managed Care – PPO | Admitting: Pulmonary Disease

## 2016-02-07 ENCOUNTER — Encounter: Payer: Self-pay | Admitting: Pulmonary Disease

## 2016-02-07 VITALS — BP 102/62 | HR 63 | Ht 70.0 in | Wt 192.6 lb

## 2016-02-07 DIAGNOSIS — Z9989 Dependence on other enabling machines and devices: Principal | ICD-10-CM

## 2016-02-07 DIAGNOSIS — G4733 Obstructive sleep apnea (adult) (pediatric): Secondary | ICD-10-CM

## 2016-02-07 NOTE — Progress Notes (Signed)
   Subjective:    Patient ID: [name], male    DOB: [birth year], 40 y.o.   MRN: [id number]  HPI    Review of Systems  Constitutional: Negative for fever and unexpected weight change.  HENT: Positive for congestion. Negative for dental problem, ear pain, nosebleeds, postnasal drip, rhinorrhea, sinus pressure, sneezing, sore throat and trouble swallowing.   Eyes: Negative for redness and itching.  Respiratory: Negative for cough, chest tightness, shortness of breath and wheezing.   Cardiovascular: Negative for palpitations and leg swelling.  Gastrointestinal: Negative for nausea and vomiting.  Genitourinary: Negative for dysuria.  Musculoskeletal: Negative for joint swelling.  Skin: Negative for rash.  Neurological: Negative for headaches.  Hematological: Does not bruise/bleed easily.  Psychiatric/Behavioral: Negative for dysphoric mood. The patient is not nervous/anxious.        Objective:   Physical Exam        Assessment & Plan:

## 2016-02-07 NOTE — Progress Notes (Signed)
Past Surgical History He  has past surgical history that includes Cardiac catheterization (06-01-06); Cardiac defibrillator placement (06-03-06); vt ablation (10/10); and implantable cardioverter defibrillator generator change (N/A, 12/11/2011).  Allergies  Allergen Reactions  . Bee Venom Anaphylaxis  . Lidocaine     [name]    Family History His family history includes Asthma in his mother; Heart disease in his maternal grandfather and paternal grandfather; Hypertension in his paternal grandmother; Lung cancer in his maternal grandfather; Sudden death in his father.  Social History He  reports that he quit smoking about 8 years ago. His smoking use included Cigarettes. He has a 15 pack-year smoking history. He has never used smokeless tobacco. He reports that he does not drink alcohol or use illicit drugs.  Review of systems Constitutional: Negative for fever and unexpected weight change.  HENT: Positive for congestion. Negative for dental problem, ear pain, nosebleeds, postnasal drip, rhinorrhea, sinus pressure, sneezing, sore throat and trouble swallowing.   Eyes: Negative for redness and itching.  Respiratory: Negative for cough, chest tightness, shortness of breath and wheezing.   Cardiovascular: Negative for palpitations and leg swelling.  Gastrointestinal: Negative for nausea and vomiting.  Genitourinary: Negative for dysuria.  Musculoskeletal: Negative for joint swelling.  Skin: Negative for rash.  Neurological: Negative for headaches.  Hematological: Does not bruise/bleed easily.  Psychiatric/Behavioral: Negative for dysphoric mood. The patient is not nervous/anxious.     Current Outpatient Prescriptions on File Prior to Visit  Medication Sig  . fluticasone (FLONASE) 50 MCG/ACT nasal spray PLACE 2 SPRAYS INTO THE NOSE DAILY AS NEEDED.  Marland Kitchen KLOR-CON M10 10 MEQ tablet TAKE 4 TABLETS BY MOUTH 2 TIMES A DAY.  . magnesium oxide (MAG-OX) 400 (241.3 MG) MG tablet Take 1 tablet  (400 mg total) by mouth 2 (two) times daily.  . metoprolol succinate (TOPROL-XL) 25 MG 24 hr tablet TAKE 1 TABLET (25 MG TOTAL) BY MOUTH AT BEDTIME.  . montelukast (SINGULAIR) 10 MG tablet TAKE 1 TABLET BY MOUTH EVERY DAY  . Multiple Vitamin (MULITIVITAMIN WITH MINERALS) TABS Take 1 tablet by mouth daily.  . sotalol (BETAPACE) 120 MG tablet TAKE 1 & 1/2 TABLETS BY MOUTH TWICE DAILY  . VITAMIN D, ERGOCALCIFEROL, PO Take 1 tablet by mouth daily.   No current facility-administered medications on file prior to visit.    Chief Complaint  Patient presents with  . SLEEP CONSULT    Former [place] pt- last seen 2013. Wears CPAP nightly. Denies any issues with pressure setting. Some problems with mask fit. DME: [employer].; Epworth Score: 7    Tests: PSG 12/03/11 >> AHI 22, SpO2 low 89%  Past medical history He  has a past medical history of Ventricular tachycardia ([place]); Arrhythmogenic RV Cardiomyopathy; Anxiety; ICD (implantable cardiac defibrillator) in place; and History of chicken pox.  Vital signs BP 102/62 mmHg  Pulse 63  Ht 5\' 10"  (1.778 m)  Wt 192 lb 9.6 oz (87.363 kg)  BMI 27.64 kg/m2  SpO2 98%  History of Present Illness [name] is a 40 y.o. male for evaluation of sleep problems.  He has hx of arrhythmia.  There was concern about snoring.  He had sleep study in 2013 which showed moderate OSA.  He was started on CPAP, and has used ever since.  He hasn't had follow up for his CPAP since 2013.  He goes to sleep at 11 pm.  He falls asleep 15 minutes.  He wakes up some times to use the bathroom.  He  gets out of bed at 7 am.  He feels okay in the morning.  He denies morning headache.  He does not use anything to help him fall sleep or stay awake.  He denies sleep walking, sleep talking, bruxism, or nightmares.  There is no history of restless legs.  He denies sleep hallucinations, sleep paralysis, or cataplexy.  The Epworth score is 7 out of 24.   Physical Exam:  General - No  distress ENT - No sinus tenderness, no oral exudate, no LAN, no thyromegaly, TM clear, pupils equal/reactive, high arched palate, MP 3 Cardiac - s1s2 regular, no murmur, pulses symmetric Chest - No wheeze/rales/dullness, good air entry, normal respiratory excursion Back - No focal tenderness Abd - Soft, non-tender, no organomegaly, + bowel sounds Ext - No edema Neuro - Normal strength, cranial nerves intact Skin - No rashes Psych - Normal mood, and behavior  Discussion: He has hx of sleep apnea and has been using CPAP.  He is concerned about mask fit, but otherwise reports compliance with CPAP and improvement in sleep.  We discussed how sleep apnea can affect various health problems, including risks for hypertension, cardiovascular disease, and diabetes.  We also discussed how sleep disruption can increase risks for accidents, such as while driving.  Weight loss as a means of improving sleep apnea was also reviewed.  Additional treatment options discussed were CPAP therapy, oral appliance, and surgical intervention.   Assessment/plan:  Obstructive sleep apnea. - will arrange for CPAP mask refit - will get copy of his download   Patient Instructions  Can look up following company web sites for CPAP mask options: [employer], [employer], [employer], [employer]  Will arrange for CPAP mask refitting and get copy of CPAP report  Follow up in 1 year     Chesley Mires, M.D. Pager [phone] 02/07/2016, 3:33 PM

## 2016-02-07 NOTE — Patient Instructions (Signed)
Can look up following company web sites for CPAP mask options: [employer], [employer], [employer], [employer]  Will arrange for CPAP mask refitting and get copy of CPAP report  Follow up in 1 year

## 2016-02-21 ENCOUNTER — Encounter: Payer: Self-pay | Admitting: Cardiology

## 2016-02-21 ENCOUNTER — Encounter: Payer: Self-pay | Admitting: Internal Medicine

## 2016-03-16 ENCOUNTER — Telehealth: Payer: Self-pay | Admitting: Family Medicine

## 2016-03-16 DIAGNOSIS — R740 Nonspecific elevation of levels of transaminase and lactic acid dehydrogenase [LDH]: Secondary | ICD-10-CM

## 2016-03-16 DIAGNOSIS — R7402 Elevation of levels of lactic acid dehydrogenase (LDH): Secondary | ICD-10-CM

## 2016-03-16 DIAGNOSIS — E559 Vitamin D deficiency, unspecified: Secondary | ICD-10-CM

## 2016-03-16 DIAGNOSIS — R7401 Elevation of levels of liver transaminase levels: Secondary | ICD-10-CM

## 2016-03-16 DIAGNOSIS — E781 Pure hyperglyceridemia: Secondary | ICD-10-CM

## 2016-03-16 DIAGNOSIS — K760 Fatty (change of) liver, not elsewhere classified: Secondary | ICD-10-CM

## 2016-03-16 NOTE — Telephone Encounter (Signed)
-----   Message from Ellamae Sia sent at 03/11/2016  4:12 PM EDT ----- Regarding: Lab orders for Thursday, 8.3.17 Lab orders for f/u appt

## 2016-03-20 ENCOUNTER — Other Ambulatory Visit (INDEPENDENT_AMBULATORY_CARE_PROVIDER_SITE_OTHER): Payer: BC Managed Care – PPO

## 2016-03-20 DIAGNOSIS — K760 Fatty (change of) liver, not elsewhere classified: Secondary | ICD-10-CM

## 2016-03-20 DIAGNOSIS — E781 Pure hyperglyceridemia: Secondary | ICD-10-CM | POA: Diagnosis not present

## 2016-03-20 DIAGNOSIS — R7401 Elevation of levels of liver transaminase levels: Secondary | ICD-10-CM

## 2016-03-20 DIAGNOSIS — R740 Nonspecific elevation of levels of transaminase and lactic acid dehydrogenase [LDH]: Secondary | ICD-10-CM | POA: Diagnosis not present

## 2016-03-20 DIAGNOSIS — E559 Vitamin D deficiency, unspecified: Secondary | ICD-10-CM | POA: Diagnosis not present

## 2016-03-20 DIAGNOSIS — R7402 Elevation of levels of lactic acid dehydrogenase (LDH): Secondary | ICD-10-CM

## 2016-03-20 LAB — COMPREHENSIVE METABOLIC PANEL
ALBUMIN: 4.7 g/dL (ref 3.5–5.2)
ALT: 45 U/L (ref 0–53)
AST: 28 U/L (ref 0–37)
Alkaline Phosphatase: 40 U/L (ref 39–117)
BUN: 11 mg/dL (ref 6–23)
CHLORIDE: 103 meq/L (ref 96–112)
CO2: 31 meq/L (ref 19–32)
Calcium: 9.7 mg/dL (ref 8.4–10.5)
Creatinine, Ser: 0.86 mg/dL (ref 0.40–1.50)
GFR: 105.79 mL/min (ref >60.00)
GLUCOSE: 79 mg/dL (ref 70–99)
POTASSIUM: 4.1 meq/L (ref 3.5–5.1)
SODIUM: 141 meq/L (ref 135–145)
Total Bilirubin: 0.6 mg/dL (ref 0.2–1.2)
Total Protein: 7.6 g/dL (ref 6.0–8.3)

## 2016-03-20 LAB — LIPID PANEL
Cholesterol: 207 mg/dL — ABNORMAL HIGH (ref 0–200)
HDL: 35.1 mg/dL — AB (ref >39.00)
NONHDL: 172.02
TRIGLYCERIDES: 350 mg/dL — AB (ref 0.0–149.0)
Total CHOL/HDL Ratio: 6
VLDL: 70 mg/dL — ABNORMAL HIGH (ref 0.0–40.0)

## 2016-03-20 LAB — VITAMIN D 25 HYDROXY (VIT D DEFICIENCY, FRACTURES): VITD: 48.79 ng/mL (ref 30.00–100.00)

## 2016-03-20 LAB — LDL CHOLESTEROL, DIRECT: Direct LDL: 120 mg/dL

## 2016-03-28 ENCOUNTER — Encounter: Payer: Self-pay | Admitting: Family Medicine

## 2016-03-28 ENCOUNTER — Ambulatory Visit (INDEPENDENT_AMBULATORY_CARE_PROVIDER_SITE_OTHER): Payer: BC Managed Care – PPO | Admitting: Family Medicine

## 2016-03-28 VITALS — BP 104/66 | HR 65 | Temp 98.4°F | Ht 70.0 in | Wt 197.0 lb

## 2016-03-28 DIAGNOSIS — E786 Lipoprotein deficiency: Secondary | ICD-10-CM | POA: Diagnosis not present

## 2016-03-28 DIAGNOSIS — J302 Other seasonal allergic rhinitis: Secondary | ICD-10-CM | POA: Diagnosis not present

## 2016-03-28 DIAGNOSIS — E559 Vitamin D deficiency, unspecified: Secondary | ICD-10-CM

## 2016-03-28 DIAGNOSIS — E781 Pure hyperglyceridemia: Secondary | ICD-10-CM

## 2016-03-28 DIAGNOSIS — Z0000 Encounter for general adult medical examination without abnormal findings: Secondary | ICD-10-CM | POA: Diagnosis not present

## 2016-03-28 MED ORDER — MONTELUKAST SODIUM 10 MG PO TABS: 10.0000 mg | ORAL_TABLET | Freq: Every day | ORAL | 3 refills | Status: DC

## 2016-03-28 MED ORDER — FLUTICASONE PROPIONATE 50 MCG/ACT NA SUSP: NASAL | 11 refills | Status: DC

## 2016-03-28 NOTE — Progress Notes (Signed)
Subjective:    Patient ID: [name], male    DOB: 1976-04-18, 40 y.o.   MRN: [id number]  HPI Here for health maintenance exam and to review chronic medical problems    Has had a good summer overall-working a lot   Wt Readings from Last 3 Encounters:  03/28/16 197 lb (89.4 kg)  02/07/16 192 lb 9.6 oz (87.4 kg)  10/09/15 195 lb 6.4 oz (88.6 kg)  bmi is 28.2 Blames this on "getting married" and busy -got back to old habit  Had lost down to 175 - watched what he ate / ready to get back to a routine  Avoiding fast food  Does eat out a lot  Too many sweets -esp when traveling    Sees cardiology regularly for hx of cardiomyopathy/ has ICD Doing well/ no recent issues   I px allergy medicines Tolerable this year overall  Takes singulair 10 mg daily (does not take it all year)  Also flonase  Takes either zyrtec or allegra on and off   cpap is helping OSA  No fam hx of prostate cancer  No nocturia overall  Frequency depends on water intake   Gets a flu shot every year   Has not been screened for HIV recently - was tested years ago  Not high risk  Wants to screen next year   Has hx of fatty liver Lab Results  Component Value Date   ALT 45 03/20/2016   AST 28 03/20/2016   ALKPHOS 40 03/20/2016   BILITOT 0.6 03/20/2016   Hx of low HDL Lab Results  Component Value Date   CHOL 207 (H) 03/20/2016   HDL 35.10 (L) 03/20/2016   LDLDIRECT 120.0 03/20/2016   TRIG 350.0 (H) 03/20/2016   CHOLHDL 6 03/20/2016  stays active but cannot do strenuous exercise due to heart issues He does track steps (makes effort to walk more) Also had high trig this last check = ate a small breakfast biscuits  No chol meds Glucose good at 79 States he has eaten more sweets   Hx of low vit D This was improved at 48.7 at recent check  Takes otc D every day   Patient Active Problem List   Diagnosis Date Noted  . Tinea corporis 01/29/2015  . Hypertriglyceridemia 02/21/2014  . Low HDL  (under 40) 02/21/2014  . Vitamin D deficiency 02/21/2014  . Allergic rhinitis 02/21/2014  . Erectile dysfunction 08/19/2013  . OSA (obstructive sleep apnea) 10/30/2011  . Hypokalemia 09/29/2011  . 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 ICD-Medtronic 01/10/2011  . 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 Fatty liver 01/04/2009  . Gallstones 01/04/2009  . GERD 12/05/2008  . TRANSAMINASES, SERUM, ELEVATED 12/05/2008  . VENTRICULAR TACHYCARDIA, PAROXYSMAL 11/03/2008  . PARESTHESIA 11/03/2008  . ECZEMA 08/17/2007   Past Medical History:  Diagnosis Date  . Anxiety   . 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 History of chicken pox   . ICD (implantable cardiac defibrillator) in place   . Ventricular tachycardia (HCC)    sotalol therapy;  catheter ablation at [facility] 10/10 and [facility] 2013   Past Surgical History:  Procedure Laterality Date  . CARDIAC CATHETERIZATION  06-01-06  . CARDIAC DEFIBRILLATOR PLACEMENT  06-03-06   Medtronic  . IMPLANTABLE CARDIOVERTER DEFIBRILLATOR GENERATOR CHANGE N/A 12/11/2011   Procedure: IMPLANTABLE CARDIOVERTER DEFIBRILLATOR GENERATOR CHANGE;  Surgeon: Deboraha Sprang, MD;  Location: [facility] CATH LAB;  Service:  Cardiovascular;  Laterality: N/A;  . vt ablation  10/10   Social History  Substance Use Topics  . Smoking status: Former Smoker    Packs/day: 1.00    Years: 15.00    Types: Cigarettes    Quit date: 08/19/2007  . Smokeless tobacco: Never Used  . Alcohol use No   Family History  Problem Relation Age of Onset  . Diabetes    . Stroke    . Heart attack    . Prostate cancer    . Breast cancer    . Ovarian cancer    . Uterine cancer    . Colon cancer    . Drug abuse    . Depression    . Asthma Mother   . Lung cancer Maternal Grandfather   . Heart disease Maternal Grandfather   . Heart disease Paternal Grandfather   . Hypertension Paternal Grandmother   . Sudden death Father     Allergies  Allergen Reactions  . Bee Venom Anaphylaxis  . Lidocaine     [name]   Current Outpatient Prescriptions on File Prior to Visit  Medication Sig Dispense Refill  . fluticasone (FLONASE) 50 MCG/ACT nasal spray PLACE 2 SPRAYS INTO THE NOSE DAILY AS NEEDED. 16 g 2  . KLOR-CON M10 10 MEQ tablet TAKE 4 TABLETS BY MOUTH 2 TIMES A DAY. 240 tablet 6  . magnesium oxide (MAG-OX) 400 (241.3 MG) MG tablet Take 1 tablet (400 mg total) by mouth 2 (two) times daily. 180 tablet 3  . metoprolol succinate (TOPROL-XL) 25 MG 24 hr tablet TAKE 1 TABLET (25 MG TOTAL) BY MOUTH AT BEDTIME. 30 tablet 11  . montelukast (SINGULAIR) 10 MG tablet TAKE 1 TABLET BY MOUTH EVERY DAY 90 tablet 3  . Multiple Vitamin (MULITIVITAMIN WITH MINERALS) TABS Take 1 tablet by mouth daily.    . sotalol (BETAPACE) 120 MG tablet TAKE 1 & 1/2 TABLETS BY MOUTH TWICE DAILY 90 tablet 6   No current facility-administered medications on file prior to visit.      Review of Systems    Review of Systems  Constitutional: Negative for fever, appetite change, fatigue and unexpected weight change.  Eyes: Negative for pain and visual disturbance.  Respiratory: Negative for cough and shortness of breath.   Cardiovascular: Negative for cp or palpitations    Gastrointestinal: Negative for nausea, diarrhea and constipation.  Genitourinary: Negative for urgency and frequency.  Skin: Negative for pallor or rash   Neurological: Negative for weakness, light-headedness, numbness and headaches.  Hematological: Negative for adenopathy. Does not bruise/bleed easily.  Psychiatric/Behavioral: Negative for dysphoric mood. The patient is not nervous/anxious.      Objective:   Physical Exam  Constitutional: He appears well-developed and well-nourished. No distress.  Well appearing   HENT:  Head: Normocephalic and atraumatic.  Right Ear: External ear normal.  Left Ear: External ear normal.  Nose: Nose normal.  Mouth/Throat:  Oropharynx is clear and moist.  Eyes: Conjunctivae and EOM are normal. Pupils are equal, round, and reactive to light. Right eye exhibits no discharge. Left eye exhibits no discharge. No scleral icterus.  Neck: Normal range of motion. Neck supple. No JVD present. Carotid bruit is not present. No thyromegaly present.  Cardiovascular: Normal rate, regular rhythm, normal heart sounds and intact distal pulses.  Exam reveals no gallop.   Pulmonary/Chest: Effort normal and breath sounds normal. No respiratory distress. He has no wheezes. He exhibits no tenderness.  Abdominal: Soft. Bowel sounds are normal. He  exhibits no distension, no abdominal bruit and no mass. There is no tenderness.  Musculoskeletal: He exhibits no edema or tenderness.  Lymphadenopathy:    He has no cervical adenopathy.  Neurological: He is alert. He has normal reflexes. No cranial nerve deficit. He exhibits normal muscle tone. Coordination normal.  Skin: Skin is warm and dry. No rash noted. No erythema. No pallor.  Some solar lentigines  Psychiatric: He has a normal mood and affect.          Assessment & Plan:   Problem List Items Addressed This Visit      Respiratory   Allergic rhinitis - Primary    Refilled flonase and singulair for seasonal use Disc allergen avoidance when able        Other   Vitamin D deficiency    Vitamin D level is therapeutic with current supplementation Disc importance of this to bone and overall health       Routine general medical examination at a health care facility    Overall doing well Reviewed health habits including diet and exercise and skin cancer prevention Reviewed appropriate screening tests for age  Also reviewed health mt list, fam hx and immunization status , as well as social and family history    Labs reviewed  No prostate problems  Will work on better diet and exercise again       Low HDL (under 40)    This is stable Triglycerides are up  Rev diet for  better cholesterol Limited exercise-will remain active Handout given on triglycerides       Hypertriglyceridemia    Overall stable Was not totally fasting  Given handout on diet Will continue activity  Fish oil /eating fish is recommended        Other Visit Diagnoses   None.

## 2016-03-28 NOTE — Progress Notes (Signed)
Pre visit review using our clinic review tool, if applicable. No additional management support is needed unless otherwise documented below in the visit note. 

## 2016-03-28 NOTE — Assessment & Plan Note (Signed)
This is stable Triglycerides are up  Rev diet for better cholesterol Limited exercise-will remain active Handout given on triglycerides

## 2016-03-28 NOTE — Patient Instructions (Signed)
When you get your next physical labs- do let us know you want HIV screening  Take care of yourself  Get back to healthy diet and exercise  Labs are ok but triglycerides are still high and HDL is still low Here is a handout regarding triglycerides

## 2016-03-28 NOTE — Assessment & Plan Note (Signed)
Overall stable Was not totally fasting  Given handout on diet Will continue activity  Fish oil /eating fish is recommended

## 2016-03-28 NOTE — Assessment & Plan Note (Signed)
Vitamin D level is therapeutic with current supplementation Disc importance of this to bone and overall health  

## 2016-03-28 NOTE — Assessment & Plan Note (Signed)
Overall doing well Reviewed health habits including diet and exercise and skin cancer prevention Reviewed appropriate screening tests for age  Also reviewed health mt list, fam hx and immunization status , as well as social and family history    Labs reviewed  No prostate problems  Will work on better diet and exercise again

## 2016-03-28 NOTE — Assessment & Plan Note (Signed)
Refilled flonase and singulair for seasonal use Disc allergen avoidance when able

## 2016-04-08 ENCOUNTER — Other Ambulatory Visit: Payer: Self-pay | Admitting: Internal Medicine

## 2016-04-16 ENCOUNTER — Encounter: Payer: BC Managed Care – PPO | Admitting: Nurse Practitioner

## 2016-04-18 ENCOUNTER — Ambulatory Visit (INDEPENDENT_AMBULATORY_CARE_PROVIDER_SITE_OTHER): Payer: BC Managed Care – PPO | Admitting: Nurse Practitioner

## 2016-04-18 ENCOUNTER — Encounter: Payer: Self-pay | Admitting: Nurse Practitioner

## 2016-04-18 VITALS — BP 114/66 | HR 60 | Ht 69.0 in | Wt 195.6 lb

## 2016-04-18 DIAGNOSIS — I472 Ventricular tachycardia, unspecified: Secondary | ICD-10-CM

## 2016-04-18 DIAGNOSIS — G4733 Obstructive sleep apnea (adult) (pediatric): Secondary | ICD-10-CM | POA: Diagnosis not present

## 2016-04-18 DIAGNOSIS — Z9989 Dependence on other enabling machines and devices: Secondary | ICD-10-CM

## 2016-04-18 DIAGNOSIS — I428 Other cardiomyopathies: Secondary | ICD-10-CM | POA: Diagnosis not present

## 2016-04-18 LAB — CUP PACEART INCLINIC DEVICE CHECK
Implantable Lead Location: 753860
Implantable Lead Model: 6947
MDC IDC LEAD IMPLANT DT: 20071018
MDC IDC SESS DTM: 20170911095605

## 2016-04-18 LAB — BASIC METABOLIC PANEL
BUN: 11 mg/dL (ref 7–25)
CALCIUM: 9.6 mg/dL (ref 8.6–10.3)
CO2: 29 mmol/L (ref 20–31)
Chloride: 102 mmol/L (ref 98–110)
Creat: 1.02 mg/dL (ref 0.60–1.35)
Glucose, Bld: 114 mg/dL — ABNORMAL HIGH (ref 65–99)
POTASSIUM: 4.1 mmol/L (ref 3.5–5.3)
SODIUM: 140 mmol/L (ref 135–146)

## 2016-04-18 LAB — MAGNESIUM: MAGNESIUM: 1.8 mg/dL (ref 1.5–2.5)

## 2016-04-18 NOTE — Progress Notes (Signed)
Electrophysiology Office Note Date: 04/18/2016  ID:  [name], DOB 1976-03-13, MRN [id number]  PCP: Loura Pardon, MD Electrophysiologist: Caryl Comes  CC: Routine ICD follow-up  [name] is a 40 y.o. male seen today for Dr Caryl Comes.  He presents today for routine electrophysiology followup.  Since last being seen in our clinic, the patient reports doing very well. He denies chest pain, palpitations, dyspnea, PND, orthopnea, nausea, vomiting, dizziness, syncope, edema, weight gain, or early satiety.  He has not had ICD shocks.   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 Anxiety   . 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 History of chicken pox   . ICD (implantable cardiac defibrillator) in place   . Ventricular tachycardia (HCC)    sotalol therapy;  catheter ablation at [facility] 10/10 and [facility] 2013   Past Surgical History:  Procedure Laterality Date  . CARDIAC CATHETERIZATION  06-01-06  . CARDIAC DEFIBRILLATOR PLACEMENT  06-03-06   Medtronic  . IMPLANTABLE CARDIOVERTER DEFIBRILLATOR GENERATOR CHANGE N/A 12/11/2011   Procedure: IMPLANTABLE CARDIOVERTER DEFIBRILLATOR GENERATOR CHANGE;  Surgeon: Deboraha Sprang, MD;  Location: [facility] CATH LAB;  Service: Cardiovascular;  Laterality: N/A;  . vt ablation  10/10    Current Outpatient Prescriptions  Medication Sig Dispense Refill  . Cholecalciferol (VITAMIN D PO) Take 1 tablet by mouth daily. OTC once daily     . fluticasone (FLONASE) 50 MCG/ACT nasal spray PLACE 2 SPRAYS INTO THE NOSE DAILY AS NEEDED. 16 g 11  . KLOR-CON M10 10 MEQ tablet TAKE 4 TABLETS BY MOUTH 2 TIMES A DAY. 240 tablet 6  . magnesium oxide (MAG-OX) 400 (241.3 Mg) MG tablet TAKE 1 TABLET (400 MG TOTAL) BY MOUTH 2 (TWO) TIMES DAILY. 60 tablet 5  . metoprolol succinate (TOPROL-XL) 25 MG 24 hr  tablet TAKE 1 TABLET (25 MG TOTAL) BY MOUTH AT BEDTIME. 30 tablet 11  . montelukast (SINGULAIR) 10 MG tablet Take 1 tablet (10 mg total) by mouth daily. 90 tablet 3  . Multiple Vitamin (MULITIVITAMIN WITH MINERALS) TABS Take 1 tablet by mouth daily.    . sotalol (BETAPACE) 120 MG tablet TAKE 1 & 1/2 TABLETS BY MOUTH TWICE DAILY 90 tablet 6   No current facility-administered medications for this visit.     Allergies:   Bee venom and Lidocaine   Social History: Social History   Social History  . Marital status: Single    Spouse name: N/A  . Number of children: 0  . Years of education: N/A   Occupational History  . 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 Smoking status: Former Smoker    Packs/day: 1.00    Years: 15.00    Types: Cigarettes    Quit date: 08/19/2007  . Smokeless tobacco: Never Used  . Alcohol use No  . Drug use: No  . Sexual activity: Yes   Other Topics Concern  . Not on file   Social History Narrative   Works as a Audiological scientist    Family History: Family History  Problem Relation Age of Onset  . Sudden death Father   . Asthma Mother   . Diabetes    . Stroke    . Heart attack    . Prostate cancer    . Breast cancer    .  Ovarian cancer    . Uterine cancer    . Colon cancer    . Drug abuse    . Depression    . Lung cancer Maternal Grandfather   . Heart disease Maternal Grandfather   . Heart disease Paternal Grandfather   . Hypertension Paternal Grandmother     Review of Systems: All other systems reviewed and are otherwise negative except as noted above.   Physical Exam: VS:  BP 114/66   Pulse 60   Ht 5\' 9"  (1.753 m)   Wt 195 lb 9.6 oz (88.7 kg)   BMI 28.89 kg/m  , BMI Body mass index is 28.89 kg/m.  GEN- The patient is well appearing, alert and oriented x 3 today.   HEENT: normocephalic, atraumatic; sclera clear, conjunctiva pink; hearing intact; oropharynx clear; neck supple Lungs- Clear to ausculation bilaterally, normal work of  breathing.  No wheezes, rales, rhonchi Heart- Regular rate and rhythm GI- soft, non-tender, non-distended, bowel sounds present Extremities- no clubbing, cyanosis, or edema MS- no significant deformity or atrophy Skin- warm and dry, no rash or lesion; ICD pocket well healed Psych- euthymic mood, full affect Neuro- strength and sensation are intact  ICD interrogation- reviewed in detail today,  See PACEART report  EKG:  EKG is not ordered today.  Recent Labs: 10/09/2015: Magnesium 1.8 03/20/2016: ALT 45; BUN 11; Creatinine, Ser 0.86; Potassium 4.1; Sodium 141   Wt Readings from Last 3 Encounters:  04/18/16 195 lb 9.6 oz (88.7 kg)  03/28/16 197 lb (89.4 kg)  02/07/16 192 lb 9.6 oz (87.4 kg)     Other studies Reviewed: Additional studies/ records that were reviewed today include: Dr Olin Pia office notes  Assessment and Plan:  1.  ARVD Normal ICD function See Pace Art report No changes today  2. Ventricular tachycardia No recent recurrence Continue current therapy BMET, Mg today  3.  OSA Compliance with CPAP encouraged    Current medicines are reviewed at length with the patient today.   The patient does not have concerns regarding his medicines.  The following changes were made today:  none  Labs/ tests ordered today include: none Orders Placed This Encounter  Procedures  . Basic metabolic panel  . Magnesium     Disposition:   Follow up with [facility], Dr Caryl Comes 6 months      Signed, Chanetta Marshall, NP 04/18/2016 1:15 PM  [address] [place] [address] [phone] (office) [phone] (fax)

## 2016-04-18 NOTE — Patient Instructions (Signed)
Medication Instructions:  Your physician recommends that you continue on your current medications as directed. Please refer to the Current Medication list given to you today.   Labwork: Bmet and Mag today  Testing/Procedures: None ordered  Follow-Up: Remote monitoring is used to monitor your Pacemaker of ICD from home. This monitoring reduces the number of office visits required to check your device to one time per year. It allows [place] to keep an eye on the functioning of your device to ensure it is working properly. You are scheduled for a device check from home on 07/18/16. You may send your transmission at any time that day. If you have a wireless device, the transmission will be sent automatically. After your physician reviews your transmission, you will receive a postcard with your next transmission date.  Your physician wants you to follow-up in: 1 year with Dr.Klein You will receive a reminder letter in the mail two months in advance. If you don't receive a letter, please call our office to schedule the follow-up appointment.   Any Other Special Instructions Will Be Listed Below (If Applicable).     If you need a refill on your cardiac medications before your next appointment, please call your pharmacy.

## 2016-04-23 ENCOUNTER — Telehealth: Payer: Self-pay | Admitting: *Deleted

## 2016-04-23 NOTE — Telephone Encounter (Signed)
-----   Message from [name] Berthold, NP sent at 04/18/2016  7:28 PM EDT ----- Please notify patient of normal labs

## 2016-04-23 NOTE — Telephone Encounter (Signed)
SPOKE TO ABOUT RESULTS AND VERBALIZED UNDERSTANDING.Marland Kitchen

## 2016-05-17 ENCOUNTER — Other Ambulatory Visit: Payer: Self-pay | Admitting: Internal Medicine

## 2016-07-03 ENCOUNTER — Ambulatory Visit (INDEPENDENT_AMBULATORY_CARE_PROVIDER_SITE_OTHER): Payer: BC Managed Care – PPO | Admitting: Family Medicine

## 2016-07-03 ENCOUNTER — Encounter: Payer: Self-pay | Admitting: Family Medicine

## 2016-07-03 VITALS — BP 126/84 | HR 58 | Temp 97.9°F | Wt 195.6 lb

## 2016-07-03 DIAGNOSIS — J309 Allergic rhinitis, unspecified: Secondary | ICD-10-CM

## 2016-07-03 DIAGNOSIS — J029 Acute pharyngitis, unspecified: Secondary | ICD-10-CM

## 2016-07-03 LAB — POCT RAPID STREP A (OFFICE): RAPID STREP A SCREEN: NEGATIVE

## 2016-07-03 LAB — POCT INFLUENZA A/B
Influenza A, POC: NEGATIVE
Influenza B, POC: NEGATIVE

## 2016-07-03 NOTE — Progress Notes (Signed)
  Tommi Rumps, MD Phone: [phone]  [name] is a 40 y.o. male who presents today for same-day visit.  Patient notes onset of symptoms yesterday. Started with a scratchy throat. Some mild congestion. Had been having some ear popping over the last month but that has resolved recently. Some mild sneezes. Did have some chills this morning. Notes the sore throat was worse than his typical sinus issues and allergy issues so he wanted to get checked out as he has been exposed to flu positive and strep-positive patients recently. He uses Flonase, Singulair, and Zyrtec for his allergies.  ROS see history of present illness  Objective  Physical Exam Vitals:   07/03/16 1020  BP: 126/84  Pulse: (!) 58  Temp: 97.9 F (36.6 C)    BP Readings from Last 3 Encounters:  07/03/16 126/84  04/18/16 114/66  03/28/16 104/66   Wt Readings from Last 3 Encounters:  07/03/16 195 lb 9.6 oz (88.7 kg)  04/18/16 195 lb 9.6 oz (88.7 kg)  03/28/16 197 lb (89.4 kg)    Physical Exam  Constitutional: No distress.  HENT:  Head: Normocephalic and atraumatic.  Mild posterior oropharynx erythema with no exudates or tonsillar swelling, normal TMs bilaterally  Eyes: Conjunctivae are normal. Pupils are equal, round, and reactive to light.  Cardiovascular: Normal rate, regular rhythm and normal heart sounds.   Pulmonary/Chest: Effort normal and breath sounds normal.  Neurological: He is alert. Gait normal.  Skin: He is not diaphoretic.     Assessment/Plan: Please see individual problem list.  Allergic rhinitis Suspect patient's symptoms are likely related to allergies or a viral illness. Rapid strep and flu negative. He will continue his current allergy medicines. Tylenol if needed for discomfort. If does not improve in the next week he will follow-up. Return precautions in AVS.   Orders Placed This Encounter  Procedures  . POCT rapid strep A  . POCT Influenza A/B    Tommi Rumps,  MD [place]

## 2016-07-03 NOTE — Assessment & Plan Note (Signed)
Suspect patient's symptoms are likely related to allergies or a viral illness. Rapid strep and flu negative. He will continue his current allergy medicines. Tylenol if needed for discomfort. If does not improve in the next week he will follow-up. Return precautions in AVS.

## 2016-07-03 NOTE — Patient Instructions (Signed)
Nice to see you. Your symptoms are likely related to your allergies or a virus. Please continue your allergy medications. You can take Tylenol for any discomfort.  If you develop worsening symptoms, cough productive of blood, fevers, or any new or changing symptoms please seek medical attention.

## 2016-07-03 NOTE — Progress Notes (Signed)
Pre visit review using our clinic review tool, if applicable. No additional management support is needed unless otherwise documented below in the visit note. 

## 2016-08-02 ENCOUNTER — Other Ambulatory Visit: Payer: Self-pay | Admitting: Internal Medicine

## 2016-08-15 ENCOUNTER — Telehealth: Payer: Self-pay | Admitting: Family Medicine

## 2016-08-15 NOTE — Telephone Encounter (Signed)
Stella for sat clinic dec 30, or to UC or ED if worsening and should not wait

## 2016-08-15 NOTE — Telephone Encounter (Signed)
Pt has appt 08/16/16 at 11 AM with Dr Jenny Reichmann. Advised pt if condition worsens prior to appt pt can go to UC; pt voiced understanding.

## 2016-08-15 NOTE — Telephone Encounter (Signed)
Notified patient of MD response.

## 2016-08-15 NOTE — Telephone Encounter (Signed)
[facility] Patient Name: DELAWRENCE PORTIS DOB: 1975/12/04 Initial Comment caller states he has cough, headache and night sweats Nurse Assessment Nurse: Andria Frames, RN, Aeriel Date/Time (Eastern Time): 08/15/2016 10:17:02 AM Confirm and document reason for call. If symptomatic, describe symptoms. ---Caller states, he has a cough, headache, and night sweats. He does not feel febrile during the day. He is having a headache he is not having body aches. It started out like a cold. He started feeling a tickle in his throat on Sunday by Tuesday coughing up discolored mucous. Caller states, he has taken his temp a few times and no fever. At night though he wakes up around 2 am with coughing fits and mucous discharge and he gets hot and sweaty. It goes away pretty quickly. Pt also has a sore throat. It gets better during the day and worse at night. Does the patient have any new or worsening symptoms? ---Yes Will a triage be completed? ---Yes Related visit to physician within the last 2 weeks? ---No Does the PT have any chronic conditions? (i.e. diabetes, asthma, etc.) ---Yes List chronic conditions. ---AIBC, Is this a behavioral health or substance abuse call? ---No Guidelines Guideline Title Affirmed Question Affirmed Notes Cough - Acute Productive SEVERE coughing spells (e.g., whooping sound after coughing, vomiting after coughing) Final Disposition User See Physician within 24 Hours Hensel, RN, Aeriel Comments Nurse attempted to make an appt for the pt. Unable to get an appt. Caller does not want to be seen anywhere else and would like a call back. Referrals GO TO FACILITY REFUSED Disagree/Comply: Comply

## 2016-08-16 ENCOUNTER — Encounter: Payer: Self-pay | Admitting: Internal Medicine

## 2016-08-16 ENCOUNTER — Ambulatory Visit (INDEPENDENT_AMBULATORY_CARE_PROVIDER_SITE_OTHER): Payer: BC Managed Care – PPO | Admitting: Internal Medicine

## 2016-08-16 VITALS — BP 130/68 | HR 63 | Temp 98.2°F | Resp 20 | Wt 201.0 lb

## 2016-08-16 DIAGNOSIS — F411 Generalized anxiety disorder: Secondary | ICD-10-CM | POA: Diagnosis not present

## 2016-08-16 DIAGNOSIS — E876 Hypokalemia: Secondary | ICD-10-CM

## 2016-08-16 DIAGNOSIS — R05 Cough: Secondary | ICD-10-CM

## 2016-08-16 DIAGNOSIS — R059 Cough, unspecified: Secondary | ICD-10-CM

## 2016-08-16 MED ORDER — ACETAMINOPHEN-CODEINE 300-30 MG PO TABS: 1.0000 | ORAL_TABLET | Freq: Four times a day (QID) | ORAL | 0 refills | Status: DC | PRN

## 2016-08-16 MED ORDER — CEPHALEXIN 500 MG PO CAPS: 500.0000 mg | ORAL_CAPSULE | Freq: Three times a day (TID) | ORAL | 0 refills | Status: AC

## 2016-08-16 NOTE — Progress Notes (Signed)
Pre visit review using our clinic review tool, if applicable. No additional management support is needed unless otherwise documented below in the visit note. 

## 2016-08-16 NOTE — Patient Instructions (Signed)
Please take all new medication as prescribed - the antibiotic, and tylenol #3 for cough if needed  Please continue all other medications as before, and refills have been done if requested.  Please have the pharmacy call with any other refills you may need.  Please keep your appointments with your specialists as you may have planned

## 2016-08-16 NOTE — Progress Notes (Signed)
   Subjective:    Patient ID: [name], male    DOB: 1975-08-22, 40 y.o.   MRN: [id number]  HPI  Here with acute onset mild to mod 2-3 days ST, HA, general weakness and malaise, with prod cough greenish sputum, but Pt denies chest pain, increased sob or doe, wheezing, orthopnea, PND, increased LE swelling, palpitations, dizziness or syncope. Very concerned with his cardiac hx and risk of low K, with marked stress and anxiety over this today, here accompanied by wife.  No other new history Past Medical History:  Diagnosis Date  . Anxiety   . 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 History of chicken pox   . ICD (implantable cardiac defibrillator) in place   . Ventricular tachycardia (HCC)    sotalol therapy;  catheter ablation at [facility] 10/10 and [facility] 2013   Past Surgical History:  Procedure Laterality Date  . CARDIAC CATHETERIZATION  06-01-06  . CARDIAC DEFIBRILLATOR PLACEMENT  06-03-06   [employer]tr[employer]  . IMPLANTABLE CARDIOVERTER DEFIBRILLATOR GENERATOR CHANGE N/A 12/11/2011   Procedure: IMPLANTABLE CARDIOVERTER DEFIBRILLATOR GENERATOR CHANGE;  Surgeon: Deboraha Sprang, MD;  Location: [facility] CATH LAB;  Service: Cardiovascular;  Laterality: N/A;  . vt ablation  10/10    reports that he quit smoking about 9 years ago. His smoking use included Cigarettes. He has a 15.00 pack-year smoking history. He has never used smokeless tobacco. He reports that he does not drink alcohol or use drugs. family history includes Asthma in his mother; Heart disease in his maternal grandfather and paternal grandfather; Hypertension in his paternal grandmother; Lung cancer in his maternal grandfather; Sudden death in his father. Allergies  Allergen Reactions  . Bee Venom Anaphylaxis  . Lidocaine     [name]     Review of Systems All otherwise neg per pt     Objective:   Physical Exam BP 130/68   Pulse 63   Temp 98.2 F (36.8 C) (Oral)   Resp 20   Wt 201 lb (91.2 kg)    SpO2 97%   BMI 29.68 kg/m  VS noted,  Constitutional: Pt appears in no apparent distress HENT: Head: NCAT.  Right Ear: External ear normal.  Left Ear: External ear normal.  Eyes: . Pupils are equal, round, and reactive to light. Conjunctivae and EOM are normal Neck: Normal range of motion. Neck supple.  Cardiovascular: Normal rate and regular rhythm.  ,Bilat tm's with mild erythema.  Max sinus areas non tender.  Pharynx with mild erythema, no exudate  Pulmonary/Chest: Effort normal and breath sounds without rales or wheezing.  Neurological: Pt is alert. Not confused , motor grossly intact Skin: Skin is warm. No rash, no LE edema Psychiatric: Pt behavior is normal. No agitation.  No other new exam findings    Assessment & Plan:

## 2016-08-17 NOTE — Assessment & Plan Note (Addendum)
Mild to mod, for antibx course, cough med prn, to f/u any worsening symptoms or concerns 

## 2016-08-17 NOTE — Assessment & Plan Note (Signed)
Mild to mod, declines change in tx or cousneling,   to f/u any worsening symptoms or concerns

## 2016-08-17 NOTE — Assessment & Plan Note (Signed)
D/w pt, offered to check lab today but he declines

## 2016-09-25 ENCOUNTER — Other Ambulatory Visit: Payer: Self-pay | Admitting: Internal Medicine

## 2016-10-20 NOTE — Progress Notes (Signed)
Electrophysiology Office Note Date: 10/22/2016  ID:  [name], DOB Jan 25, 1976, MRN [id number]  PCP: Loura Pardon, MD Electrophysiologist: Caryl Comes  CC: Routine ICD follow-up  [name] is a 41 y.o. male seen today for Dr Caryl Comes.  He presents today for routine electrophysiology followup.  Since last being seen in our clinic, the patient reports doing very well. He has had increasing PVC's.  He denies chest pain, dyspnea, PND, orthopnea, nausea, vomiting, dizziness, syncope, edema, weight gain, or early satiety.  He has not had ICD shocks.   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 Anxiety   . 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 History of chicken pox   . ICD (implantable cardiac defibrillator) in place   . Ventricular tachycardia (HCC)    sotalol therapy;  catheter ablation at [facility] 10/10 and [facility] 2013   Past Surgical History:  Procedure Laterality Date  . CARDIAC CATHETERIZATION  06-01-06  . CARDIAC DEFIBRILLATOR PLACEMENT  06-03-06   [employer]tr[employer]  . IMPLANTABLE CARDIOVERTER DEFIBRILLATOR GENERATOR CHANGE N/A 12/11/2011   Procedure: IMPLANTABLE CARDIOVERTER DEFIBRILLATOR GENERATOR CHANGE;  Surgeon: Deboraha Sprang, MD;  Location: [facility] CATH LAB;  Service: Cardiovascular;  Laterality: N/A;  . vt ablation  10/10    Current Outpatient Prescriptions  Medication Sig Dispense Refill  . Acetaminophen-Codeine (TYLENOL/CODEINE #3) 300-30 MG tablet Take 1 tablet by mouth every 6 (six) hours as needed for pain. 30 tablet 0  . Cholecalciferol (VITAMIN D PO) Take 1 tablet by mouth daily. OTC once daily     . fluticasone (FLONASE) 50 MCG/ACT nasal spray PLACE 2 SPRAYS INTO THE NOSE DAILY AS NEEDED. 16 g 11  . KLOR-CON M10 10 MEQ tablet TAKE 4 TABLETS BY MOUTH 2 TIMES A DAY. 240 tablet 8  . magnesium  oxide (MAG-OX) 400 (241.3 Mg) MG tablet TAKE 1 TABLET (400 MG TOTAL) BY MOUTH 2 (TWO) TIMES DAILY. 60 tablet 5  . metoprolol succinate (TOPROL-XL) 25 MG 24 hr tablet TAKE 1 TABLET (25 MG TOTAL) BY MOUTH AT BEDTIME. 30 tablet 8  . montelukast (SINGULAIR) 10 MG tablet Take 1 tablet (10 mg total) by mouth daily. 90 tablet 3  . Multiple Vitamin (MULITIVITAMIN WITH MINERALS) TABS Take 1 tablet by mouth daily.    . sotalol (BETAPACE) 120 MG tablet TAKE 1 & 1/2 TABLETS BY MOUTH TWICE DAILY 90 tablet 11   No current facility-administered medications for this visit.     Allergies:   Bee venom and Lidocaine   Social History: Social History   Social History  . Marital status: Single    Spouse name: N/A  . Number of children: 0  . Years of education: N/A   Occupational History  . 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 Smoking status: Former Smoker    Packs/day: 1.00    Years: 15.00    Types: Cigarettes    Quit date: 08/19/2007  . Smokeless tobacco: Never Used  . Alcohol use No  . Drug use: No  . Sexual activity: Yes   Other Topics Concern  . Not on file   Social History Narrative   Works as a Audiological scientist    Family History: Family History  Problem Relation Age of Onset  . Sudden death Father   .  Asthma Mother   . Diabetes    . Stroke    . Heart attack    . Prostate cancer    . Breast cancer    . Ovarian cancer    . Uterine cancer    . Colon cancer    . Drug abuse    . Depression    . Lung cancer Maternal Grandfather   . Heart disease Maternal Grandfather   . Heart disease Paternal Grandfather   . Hypertension Paternal Grandmother     Review of Systems: All other systems reviewed and are otherwise negative except as noted above.   Physical Exam: VS:  There were no vitals taken for this visit. , BMI There is no height or weight on file to calculate BMI.  GEN- The patient is well appearing, alert and oriented x 3 today.   HEENT: normocephalic, atraumatic;  sclera clear, conjunctiva pink; hearing intact; oropharynx clear; neck supple Lungs- Clear to ausculation bilaterally, normal work of breathing.  No wheezes, rales, rhonchi Heart- Regular rate and rhythm GI- soft, non-tender, non-distended, bowel sounds present Extremities- no clubbing, cyanosis, or edema MS- no significant deformity or atrophy Skin- warm and dry, no rash or lesion; ICD pocket well healed Psych- euthymic mood, full affect Neuro- strength and sensation are intact  ICD interrogation- reviewed in detail today,  See PACEART report  EKG:  EKG is ordered today. EKG today demonstrates sinus rhythm, rate 64, PVC, 1st degree AV block, PR 230msec, QTc 469msec  Recent Labs: 03/20/2016: ALT 45 04/18/2016: BUN 11; Creat 1.02; Magnesium 1.8; Potassium 4.1; Sodium 140   Wt Readings from Last 3 Encounters:  08/16/16 201 lb (91.2 kg)  07/03/16 195 lb 9.6 oz (88.7 kg)  04/18/16 195 lb 9.6 oz (88.7 kg)     Other studies Reviewed: Additional studies/ records that were reviewed today include: Dr Olin Pia office notes  Assessment and Plan:  1.  ARVD Normal ICD function See Pace Art report No changes today  2. Ventricular tachycardia No recent recurrence Continue Sotalol. QTc stable by EKG today  BMET, Mg today Would like to increase BB, but he has resting bradycardia and V paces 1.4% of the time currently  3.  OSA Compliance with CPAP encouraged    Current medicines are reviewed at length with the patient today.   The patient does not have concerns regarding his medicines.  The following changes were made today:  none  Labs/ tests ordered today include: BMET, Mg Orders Placed This Encounter  Procedures  . Basic metabolic panel  . Magnesium  . CUP PACEART [place]  . EKG 12-Lead     Disposition:   Follow up with [facility], me in 6 months      Signed, Chanetta Marshall, NP 10/22/2016 11:23 AM  [facility] [address] [place] [address] [phone] (office) [phone] (fax)

## 2016-10-22 ENCOUNTER — Ambulatory Visit (INDEPENDENT_AMBULATORY_CARE_PROVIDER_SITE_OTHER): Payer: BC Managed Care – PPO | Admitting: Nurse Practitioner

## 2016-10-22 ENCOUNTER — Encounter: Payer: Self-pay | Admitting: Nurse Practitioner

## 2016-10-22 VITALS — BP 108/62 | HR 64 | Ht 69.0 in | Wt 192.0 lb

## 2016-10-22 DIAGNOSIS — Z9989 Dependence on other enabling machines and devices: Secondary | ICD-10-CM | POA: Diagnosis not present

## 2016-10-22 DIAGNOSIS — I472 Ventricular tachycardia, unspecified: Secondary | ICD-10-CM

## 2016-10-22 DIAGNOSIS — G4733 Obstructive sleep apnea (adult) (pediatric): Secondary | ICD-10-CM

## 2016-10-22 DIAGNOSIS — I428 Other cardiomyopathies: Secondary | ICD-10-CM | POA: Diagnosis not present

## 2016-10-22 LAB — CUP PACEART INCLINIC DEVICE CHECK
Implantable Lead Implant Date: 20071018
Implantable Pulse Generator Implant Date: 20130425
MDC IDC LEAD LOCATION: 753860
MDC IDC SESS DTM: 20180307110455

## 2016-10-22 LAB — BASIC METABOLIC PANEL
BUN / CREAT RATIO: 11 (ref 9–20)
BUN: 9 mg/dL (ref 6–20)
CHLORIDE: 100 mmol/L (ref 96–106)
CO2: 25 mmol/L (ref 18–29)
Calcium: 9.9 mg/dL (ref 8.7–10.2)
Creatinine, Ser: 0.79 mg/dL (ref 0.76–1.27)
GFR calc non Af Amer: 114 mL/min/{1.73_m2} (ref >59)
GFR, EST AFRICAN AMERICAN: 132 mL/min/{1.73_m2} (ref >59)
GLUCOSE: 62 mg/dL — AB (ref 65–99)
POTASSIUM: 4.4 mmol/L (ref 3.5–5.2)
SODIUM: 142 mmol/L (ref 134–144)

## 2016-10-22 LAB — MAGNESIUM: MAGNESIUM: 2.1 mg/dL (ref 1.6–2.3)

## 2016-10-22 NOTE — Patient Instructions (Signed)
Medication Instructions:   Your physician recommends that you continue on your current medications as directed. Please refer to the Current Medication list given to you today.    If you need a refill on your cardiac medications before your next appointment, please call your pharmacy.  Labwork: BMET AND MAG TODAY    Testing/Procedures: NONE ORDERED  TODAY    Follow-Up: Your physician wants you to follow-up in:  [place] will receive a reminder letter in the mail two months in advance. If you don't receive a letter, please call our office to schedule the follow-up appointment.   Remote monitoring is used to monitor your Pacemaker of ICD from home. This monitoring reduces the number of office visits required to check your device to one time per year. It allows [place] to keep an eye on the functioning of your device to ensure it is working properly. You are scheduled for a device check from home on .01/22/2017 You may send your transmission at any time that day. If you have a wireless device, the transmission will be sent automatically. After your physician reviews your transmission, you will receive a postcard with your next transmission date.     Any Other Special Instructions Will Be Listed Below (If Applicable).

## 2016-10-22 NOTE — Addendum Note (Signed)
Addended by: Eulis Foster on: 10/22/2016 11:43 AM   Modules accepted: Orders

## 2017-01-09 ENCOUNTER — Telehealth: Payer: Self-pay | Admitting: Internal Medicine

## 2017-01-09 MED ORDER — SOTALOL HCL 120 MG PO TABS
ORAL_TABLET | ORAL | 2 refills | Status: DC
Start: 2017-01-09 — End: 2017-05-07

## 2017-01-09 NOTE — Telephone Encounter (Signed)
New Message  Pt c/o medication issue:  1. Name of Medication:   sotalol (Betapace) 120 mg tablet Take 1 and 1/2 tablets twice daily  2. How are you currently taking this medication (dosage and times per day)? See above  3. Are you having a reaction (difficulty breathing--STAT)? N/A  4. What is your medication issue? Pharmacist voiced this medication is on back order until July and needs another prescription submitted instead.  Please f/u

## 2017-01-09 NOTE — Telephone Encounter (Signed)
I called and spoke with [facility]- they originally stated that sotalol is on a Psychologist, prison and probation services- discussed with Fuller [place], Pharm D in our office -she states that Sotalol is not noted to be on a national back order.  Called back to [facility], they state that the supplier they use, [facility], is on a national back order with Sotalol and they will most likely not have this until July. [place] and they do not have sotalol 120 mg dose- this is on back order. I called [facility] [place] and they do have some sotalol 120 mg tablets.  I spoke with the patient and notified him of what is going on. He is aware I will send his prescription in to the [facility].  He states he has about 2 weeks worth of her current supply of sotalol.

## 2017-01-22 ENCOUNTER — Ambulatory Visit (INDEPENDENT_AMBULATORY_CARE_PROVIDER_SITE_OTHER): Payer: BC Managed Care – PPO | Admitting: *Deleted

## 2017-01-22 DIAGNOSIS — I472 Ventricular tachycardia, unspecified: Secondary | ICD-10-CM

## 2017-01-23 LAB — CUP PACEART REMOTE DEVICE CHECK
Date Time Interrogation Session: 20180606205713
HIGH POWER IMPEDANCE MEASURED VALUE: 48 Ohm
HIGH POWER IMPEDANCE MEASURED VALUE: 72 Ohm
HighPow Impedance: 418 Ohm
Implantable Lead Implant Date: 20071018
Implantable Pulse Generator Implant Date: 20130425
Lead Channel Pacing Threshold Pulse Width: 0.4 ms
Lead Channel Sensing Intrinsic Amplitude: 18.625 mV
Lead Channel Sensing Intrinsic Amplitude: 18.625 mV
Lead Channel Setting Pacing Pulse Width: 0.4 ms
Lead Channel Setting Sensing Sensitivity: 1.2 mV
MDC IDC LEAD LOCATION: 753860
MDC IDC MSMT BATTERY VOLTAGE: 3.01 V
MDC IDC MSMT LEADCHNL RV IMPEDANCE VALUE: 475 Ohm
MDC IDC MSMT LEADCHNL RV PACING THRESHOLD AMPLITUDE: 0.75 V
MDC IDC SET LEADCHNL RV PACING AMPLITUDE: 2.5 V
MDC IDC STAT BRADY RV PERCENT PACED: 1.25 %

## 2017-01-23 NOTE — Progress Notes (Signed)
Remote ICD transmission.   

## 2017-01-28 ENCOUNTER — Encounter: Payer: Self-pay | Admitting: Cardiology

## 2017-02-10 ENCOUNTER — Ambulatory Visit: Payer: BC Managed Care – PPO | Admitting: Pulmonary Disease

## 2017-02-22 ENCOUNTER — Other Ambulatory Visit: Payer: Self-pay | Admitting: Internal Medicine

## 2017-02-23 ENCOUNTER — Ambulatory Visit: Payer: BC Managed Care – PPO | Admitting: Pulmonary Disease

## 2017-03-04 ENCOUNTER — Encounter: Payer: Self-pay | Admitting: Pulmonary Disease

## 2017-03-04 ENCOUNTER — Ambulatory Visit (INDEPENDENT_AMBULATORY_CARE_PROVIDER_SITE_OTHER): Payer: BC Managed Care – PPO | Admitting: Pulmonary Disease

## 2017-03-04 VITALS — BP 102/70 | HR 57 | Ht 69.0 in | Wt 190.0 lb

## 2017-03-04 DIAGNOSIS — Z9989 Dependence on other enabling machines and devices: Secondary | ICD-10-CM | POA: Diagnosis not present

## 2017-03-04 DIAGNOSIS — G4733 Obstructive sleep apnea (adult) (pediatric): Secondary | ICD-10-CM | POA: Diagnosis not present

## 2017-03-04 DIAGNOSIS — Z789 Other specified health status: Secondary | ICD-10-CM | POA: Diagnosis not present

## 2017-03-04 NOTE — Progress Notes (Signed)
  Current Outpatient Prescriptions on File Prior to Visit  Medication Sig  . Cholecalciferol (VITAMIN D PO) Take 1 tablet by mouth daily. OTC once daily   . fluticasone (FLONASE) 50 MCG/ACT nasal spray PLACE 2 SPRAYS INTO THE NOSE DAILY AS NEEDED.  Marland Kitchen KLOR-CON M10 10 MEQ tablet TAKE 4 TABLETS BY MOUTH 2 TIMES A DAY.  Marland Kitchen MAGOX 400 400 (241.3 Mg) MG tablet TAKE 1 TABLET (400 MG TOTAL) BY MOUTH 2 (TWO) TIMES DAILY.  . metoprolol succinate (TOPROL-XL) 25 MG 24 hr tablet TAKE 1 TABLET (25 MG TOTAL) BY MOUTH AT BEDTIME.  . montelukast (SINGULAIR) 10 MG tablet Take 1 tablet (10 mg total) by mouth daily.  . Multiple Vitamin (MULITIVITAMIN WITH MINERALS) TABS Take 1 tablet by mouth daily.  . sotalol (BETAPACE) 120 MG tablet TAKE 1 & 1/2 TABLETS (180 mg) BY MOUTH TWICE DAILY   No current facility-administered medications on file prior to visit.     Chief Complaint  Patient presents with  . Follow-up    Wears CPAP nightly. Pt states that he tosses/turns at night and has a lot of mask leaking. Current mask is under nose/over mouth mask. Pt tried tightening mask but then ends up with sores on face. DME: [facility]    Sleep tests PSG 12/03/11 >> AHI 22, SpO2 low 89% Auto CPAP 12/04/16 to 03/03/17 >> used on 75 of 90 nights with average 6 hrs 31 min.  Average AHI 1.7 with CPAP 9 cm H2O  Past medical history He  has a past medical history of Anxiety; Arrhythmogenic RV Cardiomyopathy; History of chicken pox; ICD (implantable cardiac defibrillator) in place; and Ventricular tachycardia ([place]).  Vital signs BP 102/70 (BP Location: Left Arm, Cuff Size: Normal)   Pulse (!) 57   Ht 5\' 9"  (1.753 m)   Wt 190 lb (86.2 kg)   SpO2 97%   BMI 28.06 kg/m   History of Present Illness [name] is a 41 y.o. male with obstructive sleep apnea.  He isn't able to take his machine when he works nights.  Otherwise he uses CPAP nightly.  This helps his sleep.  He has deviated nasal septum.  He was told that he couldn't  use nasal mask because of this.  He doesn't usually have trouble breathing through his sinuses.  He is using full face mask, but this shifts and he gets mask leak.  He has to tighten mask to stop leak, but then the mask becomes uncomfortable to wear.   Physical Exam:  General - pleasant Eyes - pupils reactive ENT - no sinus tenderness, no oral exudate, no LAN, high arched palate, MP 3, mild septal deviation Cardiac - regular, no murmur Chest - no wheeze, rales Abd - soft, non tender Ext - no edema Skin - no rashes Neuro - normal strength Psych - normal mood   Assessment/plan:  Obstructive sleep apnea. - he is compliant with CPAP and reports benefit - he reports difficulty with full face mask fit and air leak - reviewed options to improve mask fit - will try changing him to nasal mask or nasal pillows mask - will decrease his CPAP to 7 cm H2O   Patient Instructions  Will arrange for CPAP mask refitting  Can look up CPAP mask options at [email or web address] or similar web site  Will change CPAP setting to 7 cm H2O  Follow up in 1 year    Chesley Mires, M.D. Pager [phone] 03/04/2017, 11:57 AM

## 2017-03-04 NOTE — Patient Instructions (Signed)
Will arrange for CPAP mask refitting  Can look up CPAP mask options at [email or web address] or similar web site  Will change CPAP setting to 7 cm H2O  Follow up in 1 year

## 2017-03-22 ENCOUNTER — Telehealth: Payer: Self-pay | Admitting: Family Medicine

## 2017-03-22 ENCOUNTER — Encounter: Payer: Self-pay | Admitting: Family Medicine

## 2017-03-22 DIAGNOSIS — E559 Vitamin D deficiency, unspecified: Secondary | ICD-10-CM

## 2017-03-22 DIAGNOSIS — Z0000 Encounter for general adult medical examination without abnormal findings: Secondary | ICD-10-CM

## 2017-03-22 NOTE — Telephone Encounter (Signed)
-----   Message from Ellamae Sia sent at 03/16/2017  4:34 PM EDT ----- Regarding: Lab orders for Thursday, 8.9.18 Patient is scheduled for CPX labs, please order future labs, Thanks , Karna Christmas

## 2017-03-26 ENCOUNTER — Other Ambulatory Visit (INDEPENDENT_AMBULATORY_CARE_PROVIDER_SITE_OTHER): Payer: BC Managed Care – PPO

## 2017-03-26 DIAGNOSIS — Z0000 Encounter for general adult medical examination without abnormal findings: Secondary | ICD-10-CM

## 2017-03-26 DIAGNOSIS — E559 Vitamin D deficiency, unspecified: Secondary | ICD-10-CM

## 2017-03-26 LAB — COMPREHENSIVE METABOLIC PANEL
ALK PHOS: 43 U/L (ref 39–117)
ALT: 67 U/L — AB (ref 0–53)
AST: 30 U/L (ref 0–37)
Albumin: 4.6 g/dL (ref 3.5–5.2)
BILIRUBIN TOTAL: 0.5 mg/dL (ref 0.2–1.2)
BUN: 9 mg/dL (ref 6–23)
CO2: 33 mEq/L — ABNORMAL HIGH (ref 19–32)
Calcium: 9.4 mg/dL (ref 8.4–10.5)
Chloride: 104 mEq/L (ref 96–112)
Creatinine, Ser: 0.93 mg/dL (ref 0.40–1.50)
GFR: 96.14 mL/min (ref >60.00)
GLUCOSE: 101 mg/dL — AB (ref 70–99)
Potassium: 4 mEq/L (ref 3.5–5.1)
Sodium: 141 mEq/L (ref 135–145)
TOTAL PROTEIN: 7.1 g/dL (ref 6.0–8.3)

## 2017-03-26 LAB — LIPID PANEL
Cholesterol: 162 mg/dL (ref 0–200)
HDL: 30.8 mg/dL — AB (ref >39.00)
NONHDL: 130.99
Total CHOL/HDL Ratio: 5
Triglycerides: 279 mg/dL — ABNORMAL HIGH (ref 0.0–149.0)
VLDL: 55.8 mg/dL — AB (ref 0.0–40.0)

## 2017-03-26 LAB — CBC WITH DIFFERENTIAL/PLATELET
Basophils Absolute: 0 10*3/uL (ref 0.0–0.1)
Basophils Relative: 0.6 % (ref 0.0–3.0)
EOS PCT: 2.1 % (ref 0.0–5.0)
Eosinophils Absolute: 0.1 10*3/uL (ref 0.0–0.7)
HEMATOCRIT: 40.1 % (ref 39.0–52.0)
HEMOGLOBIN: 13.4 g/dL (ref 13.0–17.0)
LYMPHS PCT: 40 % (ref 12.0–46.0)
Lymphs Abs: 2.8 10*3/uL (ref 0.7–4.0)
MCHC: 33.5 g/dL (ref 30.0–36.0)
MCV: 89.7 fl (ref 78.0–100.0)
MONO ABS: 0.6 10*3/uL (ref 0.1–1.0)
Monocytes Relative: 9 % (ref 3.0–12.0)
Neutro Abs: 3.4 10*3/uL (ref 1.4–7.7)
Neutrophils Relative %: 48.3 % (ref 43.0–77.0)
Platelets: 188 10*3/uL (ref 150.0–400.0)
RBC: 4.47 Mil/uL (ref 4.22–5.81)
RDW: 13 % (ref 11.5–15.5)
WBC: 7 10*3/uL (ref 4.0–10.5)

## 2017-03-26 LAB — VITAMIN D 25 HYDROXY (VIT D DEFICIENCY, FRACTURES): VITD: 46.46 ng/mL (ref 30.00–100.00)

## 2017-03-26 LAB — TSH: TSH: 1.11 u[IU]/mL (ref 0.35–4.50)

## 2017-03-26 LAB — LDL CHOLESTEROL, DIRECT: Direct LDL: 89 mg/dL

## 2017-04-01 ENCOUNTER — Other Ambulatory Visit: Payer: BC Managed Care – PPO

## 2017-04-06 ENCOUNTER — Encounter: Payer: Self-pay | Admitting: Family Medicine

## 2017-04-06 ENCOUNTER — Encounter: Payer: Self-pay | Admitting: Nurse Practitioner

## 2017-04-06 ENCOUNTER — Ambulatory Visit (INDEPENDENT_AMBULATORY_CARE_PROVIDER_SITE_OTHER): Payer: BC Managed Care – PPO | Admitting: Family Medicine

## 2017-04-06 VITALS — BP 106/64 | HR 55 | Temp 98.3°F | Ht 68.5 in | Wt 184.0 lb

## 2017-04-06 DIAGNOSIS — E786 Lipoprotein deficiency: Secondary | ICD-10-CM | POA: Diagnosis not present

## 2017-04-06 DIAGNOSIS — Z0000 Encounter for general adult medical examination without abnormal findings: Secondary | ICD-10-CM

## 2017-04-06 DIAGNOSIS — K760 Fatty (change of) liver, not elsewhere classified: Secondary | ICD-10-CM

## 2017-04-06 DIAGNOSIS — E781 Pure hyperglyceridemia: Secondary | ICD-10-CM | POA: Diagnosis not present

## 2017-04-06 DIAGNOSIS — R7309 Other abnormal glucose: Secondary | ICD-10-CM | POA: Diagnosis not present

## 2017-04-06 DIAGNOSIS — E559 Vitamin D deficiency, unspecified: Secondary | ICD-10-CM

## 2017-04-06 NOTE — Assessment & Plan Note (Signed)
Reviewed health habits including diet and exercise and skin cancer prevention Reviewed appropriate screening tests for age  Also reviewed health mt list, fam hx and immunization status , as well as social and family history   See HPI Labs reviewed  No prostate hx  Doing well from cardiac perspective  Skin exam is re assuring

## 2017-04-06 NOTE — Patient Instructions (Addendum)
Don't forget to get a flu shot in the fall   Glucose is 101 fasting (high normal)  Try to get most of your carbohydrates from produce (with the exception of white potatoes)  Eat less bread/pasta/rice/snack foods/cereals/sweets and other items from the middle of the grocery store (processed carbs)

## 2017-04-06 NOTE — Assessment & Plan Note (Signed)
Level in the 40s  Continue current supplementation

## 2017-04-06 NOTE — Assessment & Plan Note (Signed)
ALT is mildly elevated  Other labs nl  No abd pain (hx of a gallstone)  Continue to follow  Avoid etoh and acetaminophen

## 2017-04-06 NOTE — Progress Notes (Signed)
Subjective:    Patient ID: [name], male    DOB: Aug 24, 1975, 41 y.o.   MRN: [id number]  HPI Here for health maintenance exam and to review chronic medical problems    Wt Readings from Last 3 Encounters:  04/06/17 184 lb (83.5 kg)  03/04/17 190 lb (86.2 kg)  10/22/16 192 lb (87.1 kg)  working on weight loss- watching diet more closely / smaller portions  Daily walk for exercise  27.57 kg/m   Working a Therapist, occupational some mini trips    Will get a flu shot in the fall  Tdap 1/13  Hx of low D Level is 46.46 today- he is taking it otc    Hx of heart arrhythmia   (fairly stable lately)  Mild episode about once per month  Takes sotalol and metoprolol  Also K and mag  Has an ICD  Prostate history  As a rule no nocturia (once in a while)  No flow /stream problems / no frequent urination   occ hemorrhoid problems  No blood in stool  occ has to strain   Has a mole to check in genital area  His wife had HPV    Also uses cpap   Hx of fatty liver and incidental gallstone in 2010  Lab Results  Component Value Date   ALT 67 (H) 03/26/2017   AST 30 03/26/2017   ALKPHOS 43 03/26/2017   BILITOT 0.5 03/26/2017  no etoh  occ naproxen but generally avoids acetaminophen  No abd pain  He continues to work on wt loss    BP Readings from Last 3 Encounters:  04/06/17 106/64  03/04/17 102/70  10/22/16 108/62   Lab Results  Component Value Date   CREATININE 0.93 03/26/2017   BUN 9 03/26/2017   NA 141 03/26/2017   K 4.0 03/26/2017   CL 104 03/26/2017   CO2 33 (H) 03/26/2017    Glucose is 101 fasting     Hyperlipidemia/ trig with low HDL in the past Lab Results  Component Value Date   CHOL 162 03/26/2017   CHOL 207 (H) 03/20/2016   CHOL 178 01/29/2015   Lab Results  Component Value Date   HDL 30.80 (L) 03/26/2017   HDL 35.10 (L) 03/20/2016   HDL 32.30 (L) 01/29/2015   No results found for: [facility] Lab Results  Component Value Date   TRIG 279.0 (H)  03/26/2017   TRIG 350.0 (H) 03/20/2016   TRIG 371.0 (H) 01/29/2015   Lab Results  Component Value Date   CHOLHDL 5 03/26/2017   CHOLHDL 6 03/20/2016   CHOLHDL 6 01/29/2015   Lab Results  Component Value Date   LDLDIRECT 89.0 03/26/2017   LDLDIRECT 120.0 03/20/2016   LDLDIRECT 103.0 01/29/2015    Making improvement in triglycerides with diet  Exercises for HDL - still low /hereditaty   Lab Results  Component Value Date   WBC 7.0 03/26/2017   HGB 13.4 03/26/2017   HCT 40.1 03/26/2017   MCV 89.7 03/26/2017   PLT 188.0 03/26/2017   Lab Results  Component Value Date   TSH 1.11 03/26/2017       Review of Systems Review of Systems  Constitutional: Negative for fever, appetite change, fatigue and unexpected weight change.  Eyes: Negative for pain and visual disturbance.  Respiratory: Negative for cough and shortness of breath.  pos for cough after exp to mold  Cardiovascular: Negative for cp or palpitations    Gastrointestinal: Negative for nausea,  diarrhea and constipation.  Genitourinary: Negative for urgency and frequency.  Skin: Negative for pallor or rash  pos for skin tags   Pos for sk on R shoulder spot on proximal penis  Neurological: Negative for weakness, light-headedness, numbness and headaches.  Hematological: Negative for adenopathy. Does not bruise/bleed easily.  Psychiatric/Behavioral: Negative for dysphoric mood. The patient is not nervous/anxious.         Objective:   Physical Exam  Constitutional: He appears well-developed and well-nourished. No distress.  Well appearing  HENT:  Head: Normocephalic and atraumatic.  Right Ear: External ear normal.  Left Ear: External ear normal.  Nose: Nose normal.  Mouth/Throat: Oropharynx is clear and moist.  Eyes: Pupils are equal, round, and reactive to light. Conjunctivae and EOM are normal. Right eye exhibits no discharge. Left eye exhibits no discharge. No scleral icterus.  Neck: Normal range of motion. Neck  supple. No JVD present. Carotid bruit is not present. No thyromegaly present.  Cardiovascular: Normal rate, regular rhythm, normal heart sounds and intact distal pulses.  Exam reveals no gallop.   Pulmonary/Chest: Effort normal and breath sounds normal. No respiratory distress. He has no wheezes. He exhibits no tenderness.  Abdominal: Soft. Bowel sounds are normal. He exhibits no distension, no abdominal bruit and no mass. There is no tenderness.  Genitourinary:  Genitourinary Comments: Nl GU exam  No lesions No inguinal LNs  Musculoskeletal: He exhibits no edema or tenderness.  Lymphadenopathy:    He has no cervical adenopathy.  Neurological: He is alert. He has normal reflexes. No cranial nerve deficit. He exhibits normal muscle tone. Coordination normal.  Skin: Skin is warm and dry. No rash noted. No erythema. No pallor.  Solar lentigines diffusely  Small SK on R shoulder   Small slt raised brown nevus on base of penis -regular shape (not keratotic)  Psychiatric: He has a normal mood and affect.          Assessment & Plan:   Problem List Items Addressed This Visit      Digestive   Fatty liver - Primary    ALT is mildly elevated  Other labs nl  No abd pain (hx of a gallstone)  Continue to follow  Avoid etoh and acetaminophen          Other   Elevated glucose level    101 fasting disc imp of low glycemic diet and wt loss to prevent DM2       Hypertriglyceridemia    Disc goals for lipids and reasons to control them Rev labs with pt Rev low sat fat diet in detail Some improvement with better diet       Low HDL (under 40)    Disc goals for lipids and reasons to control them Rev labs with pt Rev low sat fat diet in detail Suspect hereditary  Will work on exercise and eating healthy fats       Routine general medical examination at a health care facility    Reviewed health habits including diet and exercise and skin cancer prevention Reviewed appropriate  screening tests for age  Also reviewed health mt list, fam hx and immunization status , as well as social and family history   See HPI Labs reviewed  No prostate hx  Doing well from cardiac perspective  Skin exam is re assuring        Vitamin D deficiency    Level in the 65s  Continue current supplementation

## 2017-04-06 NOTE — Assessment & Plan Note (Signed)
101 fasting  disc imp of low glycemic diet and wt loss to prevent DM2   

## 2017-04-06 NOTE — Assessment & Plan Note (Signed)
Disc goals for lipids and reasons to control them Rev labs with pt Rev low sat fat diet in detail Some improvement with better diet

## 2017-04-06 NOTE — Assessment & Plan Note (Signed)
Disc goals for lipids and reasons to control them Rev labs with pt Rev low sat fat diet in detail Suspect hereditary  Will work on exercise and eating healthy fats

## 2017-04-08 ENCOUNTER — Encounter: Payer: Self-pay | Admitting: Nurse Practitioner

## 2017-04-10 ENCOUNTER — Other Ambulatory Visit: Payer: Self-pay | Admitting: Family Medicine

## 2017-04-21 ENCOUNTER — Other Ambulatory Visit: Payer: Self-pay | Admitting: Internal Medicine

## 2017-04-23 ENCOUNTER — Ambulatory Visit (INDEPENDENT_AMBULATORY_CARE_PROVIDER_SITE_OTHER): Payer: BC Managed Care – PPO | Admitting: *Deleted

## 2017-04-23 DIAGNOSIS — I472 Ventricular tachycardia, unspecified: Secondary | ICD-10-CM

## 2017-04-24 NOTE — Progress Notes (Signed)
Remote ICD transmission.   

## 2017-04-28 ENCOUNTER — Encounter: Payer: Self-pay | Admitting: Cardiology

## 2017-04-29 ENCOUNTER — Encounter: Payer: BC Managed Care – PPO | Admitting: Nurse Practitioner

## 2017-05-07 ENCOUNTER — Other Ambulatory Visit: Payer: Self-pay | Admitting: Internal Medicine

## 2017-05-08 NOTE — Progress Notes (Signed)
Dictation #1 [id number]  ASN:[id number]    Electrophysiology Office Note Date: 05/13/2017  ID:  [name], DOB 1975/10/11, MRN [id number]  PCP: Abner Greenspan, MD Electrophysiologist: Caryl Comes  CC: Routine ICD follow-up  [name] is a 41 y.o. male seen today for Dr Caryl Comes.  He presents today for routine electrophysiology followup.  Since last being seen in our clinic, the patient reports doing very well.  He denies chest pain, dyspnea, PND, orthopnea, nausea, vomiting, dizziness, syncope, edema, weight gain, or early satiety.  He has not had ICD shocks.   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 Anxiety   . 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 History of chicken pox   . ICD (implantable cardiac defibrillator) in place   . Ventricular tachycardia (HCC)    sotalol therapy;  catheter ablation at [facility] 10/10 and [facility] 2013   Past Surgical History:  Procedure Laterality Date  . CARDIAC CATHETERIZATION  06-01-06  . CARDIAC DEFIBRILLATOR PLACEMENT  06-03-06   Medtronic  . IMPLANTABLE CARDIOVERTER DEFIBRILLATOR GENERATOR CHANGE N/A 12/11/2011   Procedure: IMPLANTABLE CARDIOVERTER DEFIBRILLATOR GENERATOR CHANGE;  Surgeon: Deboraha Sprang, MD;  Location: [facility] CATH LAB;  Service: Cardiovascular;  Laterality: N/A;  . vt ablation  10/10    Current Outpatient Prescriptions  Medication Sig Dispense Refill  . Cholecalciferol (VITAMIN D PO) Take 1 tablet by mouth daily. OTC once daily     . fexofenadine (ALLEGRA) 180 MG tablet Take 180 mg by mouth daily.    . fluticasone (FLONASE) 50 MCG/ACT nasal spray PLACE 2 SPRAYS INTO THE NOSE DAILY AS NEEDED. 16 g 11  . KLOR-CON M10 10 MEQ tablet TAKE 4 TABLETS BY MOUTH 2 TIMES A DAY. 240 tablet 5  . MAGOX 400 400 (241.3 Mg) MG tablet TAKE 1 TABLET (400 MG TOTAL)  BY MOUTH 2 (TWO) TIMES DAILY. 60 tablet 7  . metoprolol succinate (TOPROL-XL) 25 MG 24 hr tablet TAKE 1 TABLET (25 MG TOTAL) BY MOUTH AT BEDTIME. 30 tablet 8  . montelukast (SINGULAIR) 10 MG tablet TAKE 1 TABLET (10 MG TOTAL) BY MOUTH DAILY. 90 tablet 1  . Multiple Vitamin (MULITIVITAMIN WITH MINERALS) TABS Take 1 tablet by mouth daily.    . sotalol (BETAPACE) 120 MG tablet TAKE 1 & 1/2 TABLETS BY MOUTH TWICE DAILY 90 tablet 5   No current facility-administered medications for this visit.     Allergies:   Bee venom and Lidocaine   Social History: Social History   Social History  . Marital status: Single    Spouse name: N/A  . Number of children: 0  . Years of education: N/A   Occupational History  . 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 Smoking status: Former Smoker    Packs/day: 1.00    Years: 15.00    Types: Cigarettes    Quit date: 08/19/2007  . Smokeless tobacco: Never Used  . Alcohol use No  . Drug use: No  . Sexual activity: Yes   Other Topics Concern  . Not on file   Social History Narrative   Works as a Audiological scientist    Family History: Family History  Problem Relation Age of Onset  . Sudden death Father   . Asthma Mother   . Diabetes Unknown   .  Stroke Unknown   . Heart attack Unknown   . Prostate cancer Unknown   . Breast cancer Unknown   . Ovarian cancer Unknown   . Uterine cancer Unknown   . Colon cancer Unknown   . Drug abuse Unknown   . Depression Unknown   . Lung cancer Maternal Grandfather   . Heart disease Maternal Grandfather   . Heart disease Paternal Grandfather   . Hypertension Paternal Grandmother     Review of Systems: All other systems reviewed and are otherwise negative except as noted above.   Physical Exam: VS:  BP (!) 96/54   Pulse 68   Resp 16   Ht 5\' 9"  (1.753 m)   Wt 181 lb 6.4 oz (82.3 kg)   SpO2 98%   BMI 26.79 kg/m  , BMI Body mass index is 26.79 kg/m.  GEN- The patient is well appearing, alert and  oriented x 3 today.   HEENT: normocephalic, atraumatic; sclera clear, conjunctiva pink; hearing intact; oropharynx clear; neck supple Lungs- Clear to ausculation bilaterally, normal work of breathing.  No wheezes, rales, rhonchi Heart- Regular rate and rhythm GI- soft, non-tender, non-distended, bowel sounds present Extremities- no clubbing, cyanosis, or edema MS- no significant deformity or atrophy Skin- warm and dry, no rash or lesion; ICD pocket well healed Psych- euthymic mood, full affect Neuro- strength and sensation are intact  ICD interrogation- reviewed in detail today,  See PACEART report  EKG:  EKG is ordered today. EKG today demonstrates sinus bradycardia, rate 52, 1st degree AV block (PR 251msec), QTc 427msec  Recent Labs: 10/22/2016: Magnesium 2.1 03/26/2017: ALT 67; BUN 9; Creatinine, Ser 0.93; Hemoglobin 13.4; Platelets 188.0; Potassium 4.0; Sodium 141; TSH 1.11   Wt Readings from Last 3 Encounters:  05/13/17 181 lb 6.4 oz (82.3 kg)  04/06/17 184 lb (83.5 kg)  03/04/17 190 lb (86.2 kg)     Other studies Reviewed: Additional studies/ records that were reviewed today include: Dr Olin Pia office notes  Assessment and Plan:  1.  ARVD Normal ICD function See Pace Art report No changes today  2. Ventricular tachycardia No recent recurrence of sustained VT, stable NSVT episodes Continue Sotalol. QTc stable by EKG today  BMET, Mg today Would like to increase BB, but he has resting bradycardia and V paces 1.4% of the time currently  3.  OSA Compliance with CPAP encouraged    Current medicines are reviewed at length with the patient today.   The patient does not have concerns regarding his medicines.  The following changes were made today:  none  Labs/ tests ordered today include: BMET, Mg Orders Placed This Encounter  Procedures  . Basic metabolic panel  . Magnesium  . EKG 12-Lead     Disposition:   Follow up with [facility], Dr Caryl Comes in 6 months       Signed, Chanetta Marshall, NP 05/13/2017 3:11 PM  [place] [address] [phone] (office) [phone] (fax)

## 2017-05-13 ENCOUNTER — Encounter: Payer: Self-pay | Admitting: Nurse Practitioner

## 2017-05-13 ENCOUNTER — Ambulatory Visit (INDEPENDENT_AMBULATORY_CARE_PROVIDER_SITE_OTHER): Payer: BC Managed Care – PPO | Admitting: Nurse Practitioner

## 2017-05-13 VITALS — BP 96/54 | HR 68 | Resp 16 | Ht 69.0 in | Wt 181.4 lb

## 2017-05-13 DIAGNOSIS — Z9989 Dependence on other enabling machines and devices: Secondary | ICD-10-CM

## 2017-05-13 DIAGNOSIS — I472 Ventricular tachycardia, unspecified: Secondary | ICD-10-CM

## 2017-05-13 DIAGNOSIS — I428 Other cardiomyopathies: Secondary | ICD-10-CM | POA: Diagnosis not present

## 2017-05-13 DIAGNOSIS — G4733 Obstructive sleep apnea (adult) (pediatric): Secondary | ICD-10-CM

## 2017-05-13 LAB — BASIC METABOLIC PANEL
BUN / CREAT RATIO: 13 (ref 9–20)
BUN: 10 mg/dL (ref 6–20)
CALCIUM: 9.9 mg/dL (ref 8.7–10.2)
CHLORIDE: 100 mmol/L (ref 96–106)
CO2: 25 mmol/L (ref 20–29)
Creatinine, Ser: 0.78 mg/dL (ref 0.76–1.27)
GFR calc non Af Amer: 114 mL/min/{1.73_m2} (ref >59)
GFR, EST AFRICAN AMERICAN: 131 mL/min/{1.73_m2} (ref >59)
GLUCOSE: 136 mg/dL — AB (ref 65–99)
POTASSIUM: 4.9 mmol/L (ref 3.5–5.2)
Sodium: 142 mmol/L (ref 134–144)

## 2017-05-13 LAB — MAGNESIUM: MAGNESIUM: 2.2 mg/dL (ref 1.6–2.3)

## 2017-05-13 NOTE — Patient Instructions (Addendum)
Medication Instructions:   Your physician recommends that you continue on your current medications as directed. Please refer to the Current Medication list given to you today.   If you need a refill on your cardiac medications before your next appointment, please call your pharmacy.  Labwork:  BMET   AND MAG TODAY     Testing/Procedures: NONE ORDERED  TODAY    Follow-Up: Your physician wants you to follow-up in:  [place] will receive a reminder letter in the mail two months in advance. If you don't receive a letter, please call our office to schedule the follow-up appointment.  Remote monitoring is used to monitor your Pacemaker of ICD from home. This monitoring reduces the number of office visits required to check your device to one time per year. It allows [place] to keep an eye on the functioning of your device to ensure it is working properly. You are scheduled for a device check from home on . 07-23-17 You may send your transmission at any time that day. If you have a wireless device, the transmission will be sent automatically. After your physician reviews your transmission, you will receive a postcard with your next transmission date.      Any Other Special Instructions Will Be Listed Below (If Applicable).

## 2017-05-14 MED ORDER — SOTALOL HCL 120 MG PO TABS: ORAL_TABLET | ORAL | 11 refills | Status: DC

## 2017-05-14 NOTE — Addendum Note (Signed)
Addended by: Derl Barrow on: 05/14/2017 09:58 AM   Modules accepted: Orders

## 2017-05-15 LAB — CUP PACEART REMOTE DEVICE CHECK
Date Time Interrogation Session: 20180906011357
HIGH POWER IMPEDANCE MEASURED VALUE: 51 Ohm
HighPow Impedance: 361 Ohm
HighPow Impedance: 86 Ohm
Implantable Lead Implant Date: 20071018
Implantable Lead Location: 753860
Implantable Lead Model: 6947
Implantable Pulse Generator Implant Date: 20130425
Lead Channel Pacing Threshold Pulse Width: 0.4 ms
Lead Channel Sensing Intrinsic Amplitude: 18.25 mV
Lead Channel Setting Sensing Sensitivity: 1.2 mV
MDC IDC MSMT BATTERY VOLTAGE: 3 V
MDC IDC MSMT LEADCHNL RV IMPEDANCE VALUE: 456 Ohm
MDC IDC MSMT LEADCHNL RV PACING THRESHOLD AMPLITUDE: 0.875 V
MDC IDC MSMT LEADCHNL RV SENSING INTR AMPL: 18.25 mV
MDC IDC SET LEADCHNL RV PACING AMPLITUDE: 2.5 V
MDC IDC SET LEADCHNL RV PACING PULSEWIDTH: 0.4 ms
MDC IDC STAT BRADY RV PERCENT PACED: 1.45 %

## 2017-05-18 ENCOUNTER — Other Ambulatory Visit: Payer: Self-pay

## 2017-05-18 MED ORDER — METOPROLOL SUCCINATE ER 25 MG PO TB24: ORAL_TABLET | ORAL | 0 refills | Status: DC

## 2017-05-27 ENCOUNTER — Ambulatory Visit: Payer: BC Managed Care – PPO | Admitting: Nurse Practitioner

## 2017-07-21 ENCOUNTER — Encounter: Payer: Self-pay | Admitting: Nurse Practitioner

## 2017-07-23 ENCOUNTER — Ambulatory Visit (INDEPENDENT_AMBULATORY_CARE_PROVIDER_SITE_OTHER): Payer: BC Managed Care – PPO | Admitting: *Deleted

## 2017-07-23 DIAGNOSIS — I472 Ventricular tachycardia, unspecified: Secondary | ICD-10-CM

## 2017-07-23 NOTE — Progress Notes (Signed)
Remote ICD transmission.   

## 2017-07-28 LAB — CUP PACEART REMOTE DEVICE CHECK
Date Time Interrogation Session: 20181204212646
HIGH POWER IMPEDANCE MEASURED VALUE: 399 Ohm
HIGH POWER IMPEDANCE MEASURED VALUE: 52 Ohm
HighPow Impedance: 87 Ohm
Lead Channel Pacing Threshold Pulse Width: 0.4 ms
Lead Channel Sensing Intrinsic Amplitude: 19.625 mV
Lead Channel Setting Sensing Sensitivity: 1.2 mV
MDC IDC LEAD IMPLANT DT: 20071018
MDC IDC LEAD LOCATION: 753860
MDC IDC MSMT BATTERY VOLTAGE: 2.98 V
MDC IDC MSMT LEADCHNL RV IMPEDANCE VALUE: 456 Ohm
MDC IDC MSMT LEADCHNL RV PACING THRESHOLD AMPLITUDE: 0.75 V
MDC IDC MSMT LEADCHNL RV SENSING INTR AMPL: 19.625 mV
MDC IDC PG IMPLANT DT: 20130425
MDC IDC SET LEADCHNL RV PACING AMPLITUDE: 2.5 V
MDC IDC SET LEADCHNL RV PACING PULSEWIDTH: 0.4 ms
MDC IDC STAT BRADY RV PERCENT PACED: 1.26 %

## 2017-07-31 ENCOUNTER — Encounter: Payer: Self-pay | Admitting: Cardiology

## 2017-08-03 ENCOUNTER — Other Ambulatory Visit: Payer: Self-pay | Admitting: Internal Medicine

## 2017-08-04 ENCOUNTER — Other Ambulatory Visit: Payer: Self-pay | Admitting: Internal Medicine

## 2017-10-07 ENCOUNTER — Ambulatory Visit: Payer: BC Managed Care – PPO | Admitting: Internal Medicine

## 2017-10-07 VITALS — BP 110/74 | HR 46 | Ht 69.0 in | Wt 190.0 lb

## 2017-10-07 DIAGNOSIS — I472 Ventricular tachycardia: Secondary | ICD-10-CM

## 2017-10-07 DIAGNOSIS — I429 Cardiomyopathy, unspecified: Secondary | ICD-10-CM

## 2017-10-07 DIAGNOSIS — I4729 Other ventricular tachycardia: Secondary | ICD-10-CM

## 2017-10-07 LAB — CUP PACEART INCLINIC DEVICE CHECK
Battery Voltage: 2.95 V
Brady Statistic RV Percent Paced: 1.32 %
HighPow Impedance: 47 Ohm
HighPow Impedance: 475 Ohm
HighPow Impedance: 69 Ohm
Implantable Lead Implant Date: 20071018
Implantable Lead Location: 753860
Lead Channel Pacing Threshold Amplitude: 0.75 V
Lead Channel Setting Pacing Amplitude: 2.5 V
Lead Channel Setting Pacing Pulse Width: 0.4 ms
Lead Channel Setting Sensing Sensitivity: 1.2 mV
MDC IDC MSMT LEADCHNL RV IMPEDANCE VALUE: 532 Ohm
MDC IDC MSMT LEADCHNL RV PACING THRESHOLD PULSEWIDTH: 0.4 ms
MDC IDC MSMT LEADCHNL RV SENSING INTR AMPL: 17.5 mV
MDC IDC PG IMPLANT DT: 20130425
MDC IDC SESS DTM: 20190220172244

## 2017-10-07 NOTE — Progress Notes (Signed)
.  kc Patient Care Team: [name], [name] Fanny, MD as PCP - General (Family Medicine)   HPI  [name] is a 42 y.o. male Seen in followup for ventricular tachycardia in the setting of cardiomyopathy-arrhythmogenic. He is status post catheter ablation. He has had a couple of episodes of nonsustained VT which has been very difficult for him.  No interval VT.  Scant interval nonsustained VT.  Exercise tolerance without limitation.  He has some fatigue.  Remains on beta-blockers.  He remains a Public house manager for [employer]       Past Medical History:  Diagnosis Date  . Anxiety   . 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 History of chicken pox   . ICD (implantable cardiac defibrillator) in place   . Ventricular tachycardia (HCC)    sotalol therapy;  catheter ablation at [facility] 10/10 and [facility] 2013    Past Surgical History:  Procedure Laterality Date  . CARDIAC CATHETERIZATION  06-01-06  . CARDIAC DEFIBRILLATOR PLACEMENT  06-03-06   Medtronic  . IMPLANTABLE CARDIOVERTER DEFIBRILLATOR GENERATOR CHANGE N/A 12/11/2011   Procedure: IMPLANTABLE CARDIOVERTER DEFIBRILLATOR GENERATOR CHANGE;  Surgeon: Deboraha Sprang, MD;  Location: [facility] CATH LAB;  Service: Cardiovascular;  Laterality: N/A;  . vt ablation  10/10    Current Outpatient Medications  Medication Sig Dispense Refill  . Cholecalciferol (VITAMIN D PO) Take 1 tablet by mouth daily. OTC once daily     . fexofenadine (ALLEGRA) 180 MG tablet Take 180 mg by mouth daily.    . fluticasone (FLONASE) 50 MCG/ACT nasal spray PLACE 2 SPRAYS INTO THE NOSE DAILY AS NEEDED. 16 g 11  . KLOR-CON M10 10 MEQ tablet TAKE 4 TABLETS BY MOUTH 2 TIMES A DAY. 240 tablet 8  . MAGOX 400 400 (241.3 Mg) MG tablet TAKE 1 TABLET (400 MG TOTAL) BY MOUTH 2 (TWO) TIMES DAILY. 60 tablet 7  . metoprolol succinate (TOPROL-XL) 25 MG 24 hr tablet TAKE 1 TABLET BY MOUTH EVERYDAY AT BEDTIME 90 tablet 2  . montelukast (SINGULAIR) 10 MG tablet TAKE 1  TABLET (10 MG TOTAL) BY MOUTH DAILY. 90 tablet 1  . Multiple Vitamin (MULITIVITAMIN WITH MINERALS) TABS Take 1 tablet by mouth daily.    . sotalol (BETAPACE) 120 MG tablet TAKE 1 & 1/2 TABLETS BY MOUTH TWICE DAILY 90 tablet 11   No current facility-administered medications for this visit.     Allergies  Allergen Reactions  . Bee Venom Anaphylaxis  . Lidocaine     [name]    Review of Systems negative except from HPI and PMH  Physical Exam BP 110/74   Pulse (!) 46   Ht 5\' 9"  (1.753 m)   Wt 190 lb (86.2 kg)   BMI 28.06 kg/m  Well developed and nourished in no acute distress HENT normal Neck supple with JVP-flat Carotids brisk and full without bruits Clear Regular rate and rhythm, no murmurs or gallops Abd-soft with active BS without hepatomegaly No Clubbing cyanosis edema Skin-warm and dry A & Oriented  Grossly normal sensory and motor function  ECG demonstrates  Sinus 46 22/09/45 TWI V1-3 PVC  Assessment and  Plan  ARVC-TMEM gene Positive  VT    ICD    High Risk Medication Surveillance  PTSD    He is doing well.  Scant VT-nonsustained; no therapeutic VT.  Will check sotalol labs today.  Mental health is better

## 2017-10-07 NOTE — Patient Instructions (Addendum)
Medication Instructions:  Your physician recommends that you continue on your current medications as directed. Please refer to the Current Medication list given to you today.  Labwork: Your physician recommends that you have labs drawn today: BMP and MG  Testing/Procedures: None ordered.  Follow-Up: Your physician recommends that you schedule a follow-up appointment in: 6 months with Chanetta Marshall, PA   Remote monitoring is used to monitor your ICD from home. This monitoring reduces the number of office visits required to check your device to one time per year. It allows [place] to keep an eye on the functioning of your device to ensure it is working properly. You are scheduled for a device check from home on 10/22/2017. You may send your transmission at any time that day. If you have a wireless device, the transmission will be sent automatically. After your physician reviews your transmission, you will receive a postcard with your next transmission date.    Any Other Special Instructions Will Be Listed Below (If Applicable).     If you need a refill on your cardiac medications before your next appointment, please call your pharmacy.

## 2017-10-08 LAB — MAGNESIUM: Magnesium: 2.3 mg/dL (ref 1.6–2.3)

## 2017-10-08 LAB — BASIC METABOLIC PANEL
BUN/Creatinine Ratio: 12 (ref 9–20)
BUN: 10 mg/dL (ref 6–20)
CHLORIDE: 99 mmol/L (ref 96–106)
CO2: 27 mmol/L (ref 20–29)
CREATININE: 0.83 mg/dL (ref 0.76–1.27)
Calcium: 10.2 mg/dL (ref 8.7–10.2)
GFR calc Af Amer: 128 mL/min/{1.73_m2} (ref >59)
GFR calc non Af Amer: 111 mL/min/{1.73_m2} (ref >59)
Glucose: 71 mg/dL (ref 65–99)
Potassium: 4.1 mmol/L (ref 3.5–5.2)
Sodium: 141 mmol/L (ref 134–144)

## 2017-10-09 ENCOUNTER — Other Ambulatory Visit: Payer: Self-pay | Admitting: Family Medicine

## 2017-10-22 ENCOUNTER — Ambulatory Visit (INDEPENDENT_AMBULATORY_CARE_PROVIDER_SITE_OTHER): Payer: BC Managed Care – PPO | Admitting: *Deleted

## 2017-10-22 DIAGNOSIS — I472 Ventricular tachycardia: Secondary | ICD-10-CM

## 2017-10-22 DIAGNOSIS — I4729 Other ventricular tachycardia: Secondary | ICD-10-CM

## 2017-10-22 NOTE — Progress Notes (Signed)
Remote ICD transmission.   

## 2017-10-23 ENCOUNTER — Encounter: Payer: Self-pay | Admitting: Cardiology

## 2017-10-31 LAB — CUP PACEART REMOTE DEVICE CHECK
Battery Voltage: 2.95 V
Brady Statistic RV Percent Paced: 1.28 %
Date Time Interrogation Session: 20190307021509
HIGH POWER IMPEDANCE MEASURED VALUE: 418 Ohm
HIGH POWER IMPEDANCE MEASURED VALUE: 51 Ohm
HIGH POWER IMPEDANCE MEASURED VALUE: 79 Ohm
Lead Channel Impedance Value: 513 Ohm
Lead Channel Pacing Threshold Pulse Width: 0.4 ms
Lead Channel Sensing Intrinsic Amplitude: 17.25 mV
Lead Channel Sensing Intrinsic Amplitude: 17.25 mV
MDC IDC LEAD IMPLANT DT: 20071018
MDC IDC LEAD LOCATION: 753860
MDC IDC MSMT LEADCHNL RV PACING THRESHOLD AMPLITUDE: 0.75 V
MDC IDC PG IMPLANT DT: 20130425
MDC IDC SET LEADCHNL RV PACING AMPLITUDE: 2.5 V
MDC IDC SET LEADCHNL RV PACING PULSEWIDTH: 0.4 ms
MDC IDC SET LEADCHNL RV SENSING SENSITIVITY: 1.2 mV

## 2017-11-02 ENCOUNTER — Other Ambulatory Visit: Payer: Self-pay | Admitting: Internal Medicine

## 2017-11-02 MED ORDER — MAGNESIUM OXIDE 400 (241.3 MG) MG PO TABS: 1.0000 | ORAL_TABLET | Freq: Two times a day (BID) | ORAL | 10 refills | Status: DC

## 2017-11-03 ENCOUNTER — Other Ambulatory Visit: Payer: Self-pay

## 2017-11-03 MED ORDER — MAGNESIUM OXIDE 400 (241.3 MG) MG PO TABS: 1.0000 | ORAL_TABLET | Freq: Two times a day (BID) | ORAL | 10 refills | Status: DC

## 2017-11-11 ENCOUNTER — Encounter: Payer: BC Managed Care – PPO | Admitting: Internal Medicine

## 2018-01-21 ENCOUNTER — Ambulatory Visit (INDEPENDENT_AMBULATORY_CARE_PROVIDER_SITE_OTHER): Payer: BC Managed Care – PPO | Admitting: *Deleted

## 2018-01-21 DIAGNOSIS — I472 Ventricular tachycardia, unspecified: Secondary | ICD-10-CM

## 2018-01-21 NOTE — Progress Notes (Signed)
Remote ICD transmission.   

## 2018-03-10 LAB — CUP PACEART REMOTE DEVICE CHECK
Battery Voltage: 2.91 V
HIGH POWER IMPEDANCE MEASURED VALUE: 399 Ohm
HIGH POWER IMPEDANCE MEASURED VALUE: 49 Ohm
HIGH POWER IMPEDANCE MEASURED VALUE: 75 Ohm
Implantable Pulse Generator Implant Date: 20130425
Lead Channel Sensing Intrinsic Amplitude: 18.625 mV
Lead Channel Sensing Intrinsic Amplitude: 18.625 mV
Lead Channel Setting Pacing Amplitude: 2.5 V
Lead Channel Setting Pacing Pulse Width: 0.4 ms
Lead Channel Setting Sensing Sensitivity: 1.2 mV
MDC IDC LEAD IMPLANT DT: 20071018
MDC IDC LEAD LOCATION: 753860
MDC IDC MSMT LEADCHNL RV IMPEDANCE VALUE: 513 Ohm
MDC IDC MSMT LEADCHNL RV PACING THRESHOLD AMPLITUDE: 0.75 V
MDC IDC MSMT LEADCHNL RV PACING THRESHOLD PULSEWIDTH: 0.4 ms
MDC IDC SESS DTM: 20190605192050
MDC IDC STAT BRADY RV PERCENT PACED: 0.87 %

## 2018-03-13 ENCOUNTER — Encounter: Payer: Self-pay | Admitting: Podiatry

## 2018-03-13 ENCOUNTER — Ambulatory Visit: Payer: BC Managed Care – PPO | Admitting: Podiatry

## 2018-03-13 ENCOUNTER — Ambulatory Visit (INDEPENDENT_AMBULATORY_CARE_PROVIDER_SITE_OTHER): Payer: BC Managed Care – PPO

## 2018-03-13 VITALS — BP 120/62 | HR 51 | Resp 16

## 2018-03-13 DIAGNOSIS — M779 Enthesopathy, unspecified: Secondary | ICD-10-CM | POA: Diagnosis not present

## 2018-03-13 DIAGNOSIS — M778 Other enthesopathies, not elsewhere classified: Secondary | ICD-10-CM

## 2018-03-13 DIAGNOSIS — M722 Plantar fascial fibromatosis: Secondary | ICD-10-CM

## 2018-03-13 MED ORDER — METHYLPREDNISOLONE 4 MG PO TBPK: ORAL_TABLET | ORAL | 0 refills | Status: DC

## 2018-03-13 NOTE — Progress Notes (Signed)
Subjective:  Patient ID: [name], male    DOB: [birth year],  MRN: [id number] HPI Chief Complaint  Patient presents with  . Foot Pain    Plantar heel/medial foot left - aching intermittently x few months, no AM pain, noticed initally after walking a lot on a trip, tried OTC insoles, pain intensity depends on activity or shoe gear  . New Patient (Initial Visit)    42 y.o. male presents with the above complaint.   He denies fever chills nausea vomiting muscle aches pains calf pain back pain chest pain shortness of breath.  Past Medical History:  Diagnosis Date  . Anxiety   . 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 History of chicken pox   . ICD (implantable cardiac defibrillator) in place   . Ventricular tachycardia (HCC)    sotalol therapy;  catheter ablation at [facility] 10/10 and [facility] 2013   Past Surgical History:  Procedure Laterality Date  . CARDIAC CATHETERIZATION  06-01-06  . CARDIAC DEFIBRILLATOR PLACEMENT  06-03-06   Medtronic  . IMPLANTABLE CARDIOVERTER DEFIBRILLATOR GENERATOR CHANGE N/A 12/11/2011   Procedure: IMPLANTABLE CARDIOVERTER DEFIBRILLATOR GENERATOR CHANGE;  Surgeon: Deboraha Sprang, MD;  Location: [facility] CATH LAB;  Service: Cardiovascular;  Laterality: N/A;  . vt ablation  10/10    Current Outpatient Medications:  .  Cholecalciferol (VITAMIN D PO), Take 1 tablet by mouth daily. OTC once daily , Disp: , Rfl:  .  fexofenadine (ALLEGRA) 180 MG tablet, Take 180 mg by mouth daily., Disp: , Rfl:  .  fluticasone (FLONASE) 50 MCG/ACT nasal spray, PLACE 2 SPRAYS INTO THE NOSE DAILY AS NEEDED., Disp: 16 g, Rfl: 11 .  KLOR-CON M10 10 MEQ tablet, TAKE 4 TABLETS BY MOUTH 2 TIMES A DAY., Disp: 240 tablet, Rfl: 8 .  magnesium oxide (MAG-OX) 400 MG tablet, Take 1 tablet by mouth 2 (two) times daily., Disp: , Rfl: 10 .  methylPREDNISolone (MEDROL DOSEPAK) 4 MG TBPK tablet, 6 day dose pack - take as directed, Disp: 21 tablet, Rfl: 0 .  metoprolol  succinate (TOPROL-XL) 25 MG 24 hr tablet, TAKE 1 TABLET BY MOUTH EVERYDAY AT BEDTIME, Disp: 90 tablet, Rfl: 2 .  montelukast (SINGULAIR) 10 MG tablet, TAKE 1 TABLET (10 MG TOTAL) BY MOUTH DAILY., Disp: 90 tablet, Rfl: 1 .  Multiple Vitamin (MULITIVITAMIN WITH MINERALS) TABS, Take 1 tablet by mouth daily., Disp: , Rfl:  .  sotalol (BETAPACE) 120 MG tablet, TAKE 1 & 1/2 TABLETS BY MOUTH TWICE DAILY, Disp: 90 tablet, Rfl: 11  Allergies  Allergen Reactions  . Bee Venom Anaphylaxis  . Lidocaine     [name]   Review of Systems Objective:   Vitals:   03/13/18 1002  BP: 120/62  Pulse: (!) 51  Resp: 16    General: Well developed, nourished, in no acute distress, alert and oriented x3   Dermatological: Skin is warm, dry and supple bilateral. Nails x 10 are well maintained; remaining integument appears unremarkable at this time. There are no open sores, no preulcerative lesions, no rash or signs of infection present.  Vascular: Dorsalis Pedis artery and Posterior Tibial artery pedal pulses are 2/4 bilateral with immedate capillary fill time. Pedal hair growth present. No varicosities and no lower extremity edema present bilateral.   Neruologic: Grossly intact via light touch bilateral. Vibratory intact via tuning fork bilateral. Protective threshold with Semmes Wienstein monofilament intact to all pedal sites bilateral. Patellar and Achilles deep tendon reflexes  2+ bilateral. No Babinski or clonus noted bilateral.   Musculoskeletal: No gross boney pedal deformities bilateral. No pain, crepitus, or limitation noted with foot and ankle range of motion bilateral. Muscular strength 5/5 in all groups tested bilateral.  Gait: Unassisted, Nonantalgic.    Radiographs:  Radiographs taken today demonstrate an osseously mature individual left foot demonstrates soft tissue increase in density plantar fashion calcaneal insertion site with a small plantar distally oriented calcaneal heel  spur.  Assessment & Plan:   Assessment: Plantar fasciitis left.  Plan: Discussed etiology pathology conservative versus surgical therapies.  At this point I injected his left heel with 20 mg of Kenalog and 1 cc of bicarb since he is allergic to Marcaine and lidocaine.  Also placed him in a plantar fascial brace discussed appropriate shoe gear stretching exercises ice therapy I wrote a prescription for Medrol Dosepak he will follow-up with his primary provider make sure that the Medrol Dosepak is okay to take.     Caleesi Kohl T. [place], [place]

## 2018-03-13 NOTE — Patient Instructions (Addendum)
For instructions on how to put on your Plantar Fascial Brace, please visit [email or web address]   Plantar Fasciitis (Heel Spur Syndrome) with Rehab The plantar fascia is a fibrous, ligament-like, soft-tissue structure that spans the bottom of the foot. Plantar fasciitis is a condition that causes pain in the foot due to inflammation of the tissue. SYMPTOMS   Pain and tenderness on the underneath side of the foot.  Pain that worsens with standing or walking. CAUSES  Plantar fasciitis is caused by irritation and injury to the plantar fascia on the underneath side of the foot. Common mechanisms of injury include:  Direct trauma to bottom of the foot.  Damage to a small nerve that runs under the foot where the main fascia attaches to the heel bone.  Stress placed on the plantar fascia due to bone spurs. RISK INCREASES WITH:   Activities that place stress on the plantar fascia (running, jumping, pivoting, or cutting).  Poor strength and flexibility.  Improperly fitted shoes.  Tight calf muscles.  Flat feet.  Failure to warm-up properly before activity.  Obesity. PREVENTION  Warm up and stretch properly before activity.  Allow for adequate recovery between workouts.  Maintain physical fitness:  Strength, flexibility, and endurance.  Cardiovascular fitness.  Maintain a health body weight.  Avoid stress on the plantar fascia.  Wear properly fitted shoes, including arch supports for individuals who have flat feet.  PROGNOSIS  If treated properly, then the symptoms of plantar fasciitis usually resolve without surgery. However, occasionally surgery is necessary.  RELATED COMPLICATIONS   Recurrent symptoms that may result in a chronic condition.  Problems of the lower back that are caused by compensating for the injury, such as limping.  Pain or weakness of the foot during push-off following surgery.  Chronic inflammation, scarring, and partial or complete  fascia tear, occurring more often from repeated injections.  TREATMENT  Treatment initially involves the use of ice and medication to help reduce pain and inflammation. The use of strengthening and stretching exercises may help reduce pain with activity, especially stretches of the Achilles tendon. These exercises may be performed at home or with a therapist. Your caregiver may recommend that you use heel cups of arch supports to help reduce stress on the plantar fascia. Occasionally, corticosteroid injections are given to reduce inflammation. If symptoms persist for greater than 6 months despite non-surgical (conservative), then surgery may be recommended.   MEDICATION   If pain medication is necessary, then nonsteroidal anti-inflammatory medications, such as aspirin and ibuprofen, or other minor pain relievers, such as acetaminophen, are often recommended.  Do not take pain medication within 7 days before surgery.  Prescription pain relievers may be given if deemed necessary by your caregiver. Use only as directed and only as much as you need.  Corticosteroid injections may be given by your caregiver. These injections should be reserved for the most serious cases, because they may only be given a certain number of times.  HEAT AND COLD  Cold treatment (icing) relieves pain and reduces inflammation. Cold treatment should be applied for 10 to 15 minutes every 2 to 3 hours for inflammation and pain and immediately after any activity that aggravates your symptoms. Use ice packs or massage the area with a piece of ice (ice massage).  Heat treatment may be used prior to performing the stretching and strengthening activities prescribed by your caregiver, physical therapist, or athletic trainer. Use a heat pack or soak the injury in warm water.  SEEK IMMEDIATE MEDICAL   CARE IF:  Treatment seems to offer no benefit, or the condition worsens.  Any medications produce adverse side effects.   EXERCISES- RANGE OF MOTION (ROM) AND STRETCHING EXERCISES - Plantar Fasciitis (Heel Spur Syndrome) These exercises may help you when beginning to rehabilitate your injury. Your symptoms may resolve with or without further involvement from your physician, physical therapist or athletic trainer. While completing these exercises, remember:   Restoring tissue flexibility helps normal motion to return to the joints. This allows healthier, less painful movement and activity.  An effective stretch should be held for at least 30 seconds.  A stretch should never be painful. You should only feel a gentle lengthening or release in the stretched tissue.  RANGE OF MOTION - Toe Extension, Flexion  Sit with your right / left leg crossed over your opposite knee.  Grasp your toes and gently pull them back toward the top of your foot. You should feel a stretch on the bottom of your toes and/or foot.  Hold this stretch for 10 seconds.  Now, gently pull your toes toward the bottom of your foot. You should feel a stretch on the top of your toes and or foot.  Hold this stretch for 10 seconds. Repeat  times. Complete this stretch 3 times per day.   RANGE OF MOTION - Ankle Dorsiflexion, Active Assisted  Remove shoes and sit on a chair that is preferably not on a carpeted surface.  Place right / left foot under knee. Extend your opposite leg for support.  Keeping your heel down, slide your right / left foot back toward the chair until you feel a stretch at your ankle or calf. If you do not feel a stretch, slide your bottom forward to the edge of the chair, while still keeping your heel down.  Hold this stretch for 10 seconds. Repeat 3 times. Complete this stretch 2 times per day.   STRETCH  Gastroc, Standing  Place hands on wall.  Extend right / left leg, keeping the front knee somewhat bent.  Slightly point your toes inward on your back foot.  Keeping your right / left heel on the floor and your  knee straight, shift your weight toward the wall, not allowing your back to arch.  You should feel a gentle stretch in the right / left calf. Hold this position for 10 seconds. Repeat 3 times. Complete this stretch 2 times per day.  STRETCH  Soleus, Standing  Place hands on wall.  Extend right / left leg, keeping the other knee somewhat bent.  Slightly point your toes inward on your back foot.  Keep your right / left heel on the floor, bend your back knee, and slightly shift your weight over the back leg so that you feel a gentle stretch deep in your back calf.  Hold this position for 10 seconds. Repeat 3 times. Complete this stretch 2 times per day.  STRETCH  Gastrocsoleus, Standing  Note: This exercise can place a lot of stress on your foot and ankle. Please complete this exercise only if specifically instructed by your caregiver.   Place the ball of your right / left foot on a step, keeping your other foot firmly on the same step.  Hold on to the wall or a rail for balance.  Slowly lift your other foot, allowing your body weight to press your heel down over the edge of the step.  You should feel a stretch in your right / left calf.  Hold this   position for 10 seconds.  Repeat this exercise with a slight bend in your right / left knee. Repeat 3 times. Complete this stretch 2 times per day.   STRENGTHENING EXERCISES - Plantar Fasciitis (Heel Spur Syndrome)  These exercises may help you when beginning to rehabilitate your injury. They may resolve your symptoms with or without further involvement from your physician, physical therapist or athletic trainer. While completing these exercises, remember:   Muscles can gain both the endurance and the strength needed for everyday activities through controlled exercises.  Complete these exercises as instructed by your physician, physical therapist or athletic trainer. Progress the resistance and repetitions only as guided.  STRENGTH -  Towel Curls  Sit in a chair positioned on a non-carpeted surface.  Place your foot on a towel, keeping your heel on the floor.  Pull the towel toward your heel by only curling your toes. Keep your heel on the floor. Repeat 3 times. Complete this exercise 2 times per day.  STRENGTH - Ankle Inversion  Secure one end of a rubber exercise band/tubing to a fixed object (table, pole). Loop the other end around your foot just before your toes.  Place your fists between your knees. This will focus your strengthening at your ankle.  Slowly, pull your big toe up and in, making sure the band/tubing is positioned to resist the entire motion.  Hold this position for 10 seconds.  Have your muscles resist the band/tubing as it slowly pulls your foot back to the starting position. Repeat 3 times. Complete this exercises 2 times per day.  Document Released: 08/04/2005 Document Revised: 10/27/2011 Document Reviewed: 11/16/2008 ExitCare Patient Information 2014 [employer]. 

## 2018-03-18 ENCOUNTER — Ambulatory Visit (INDEPENDENT_AMBULATORY_CARE_PROVIDER_SITE_OTHER): Payer: BC Managed Care – PPO | Admitting: Orthotics

## 2018-03-18 DIAGNOSIS — M722 Plantar fascial fibromatosis: Secondary | ICD-10-CM

## 2018-03-18 DIAGNOSIS — M778 Other enthesopathies, not elsewhere classified: Secondary | ICD-10-CM

## 2018-03-18 DIAGNOSIS — M779 Enthesopathy, unspecified: Secondary | ICD-10-CM

## 2018-03-18 NOTE — Progress Notes (Signed)
Patient came into today for casting bilateral f/o to address plantar fasciitis.  Patient reports history of foot pain involving plantar aponeurosis.  Goal is to provide longitudinal arch support and correct any RF instability due to heel eversion/inversion.  Ultimate goal is to relieve tension at pf insertion calcaneal tuberosity.  Plan on semi-rigid device addressing heel stability and relieving PF tension.     LEVY

## 2018-04-12 ENCOUNTER — Other Ambulatory Visit: Payer: BC Managed Care – PPO | Admitting: Orthotics

## 2018-04-14 ENCOUNTER — Encounter: Payer: BC Managed Care – PPO | Admitting: Nurse Practitioner

## 2018-04-22 ENCOUNTER — Telehealth: Payer: Self-pay | Admitting: Cardiology

## 2018-04-22 ENCOUNTER — Ambulatory Visit: Payer: BC Managed Care – PPO | Admitting: Orthotics

## 2018-04-22 ENCOUNTER — Ambulatory Visit (INDEPENDENT_AMBULATORY_CARE_PROVIDER_SITE_OTHER): Payer: BC Managed Care – PPO | Admitting: *Deleted

## 2018-04-22 DIAGNOSIS — I472 Ventricular tachycardia, unspecified: Secondary | ICD-10-CM

## 2018-04-22 DIAGNOSIS — M722 Plantar fascial fibromatosis: Secondary | ICD-10-CM

## 2018-04-22 DIAGNOSIS — M778 Other enthesopathies, not elsewhere classified: Secondary | ICD-10-CM

## 2018-04-22 DIAGNOSIS — M779 Enthesopathy, unspecified: Secondary | ICD-10-CM

## 2018-04-22 NOTE — Telephone Encounter (Signed)
LMOVM reminding pt to send remote transmission.   

## 2018-04-22 NOTE — Progress Notes (Signed)
Patient came in today to pick up custom made foot orthotics.  The goals were accomplished and the patient reported no dissatisfaction with said orthotics.  Patient was advised of breakin period and how to report any issues. 

## 2018-04-23 NOTE — Progress Notes (Signed)
Remote ICD transmission.   

## 2018-05-14 ENCOUNTER — Encounter: Payer: Self-pay | Admitting: Family Medicine

## 2018-05-18 LAB — CUP PACEART REMOTE DEVICE CHECK
Battery Voltage: 2.87 V
Brady Statistic RV Percent Paced: 0.92 %
Date Time Interrogation Session: 20190905160836
HIGH POWER IMPEDANCE MEASURED VALUE: 399 Ohm
HighPow Impedance: 53 Ohm
HighPow Impedance: 87 Ohm
Implantable Lead Implant Date: 20071018
Implantable Lead Model: 6947
Lead Channel Sensing Intrinsic Amplitude: 18.375 mV
Lead Channel Setting Pacing Pulse Width: 0.4 ms
MDC IDC LEAD LOCATION: 753860
MDC IDC MSMT LEADCHNL RV IMPEDANCE VALUE: 475 Ohm
MDC IDC MSMT LEADCHNL RV PACING THRESHOLD AMPLITUDE: 0.875 V
MDC IDC MSMT LEADCHNL RV PACING THRESHOLD PULSEWIDTH: 0.4 ms
MDC IDC MSMT LEADCHNL RV SENSING INTR AMPL: 18.375 mV
MDC IDC PG IMPLANT DT: 20130425
MDC IDC SET LEADCHNL RV PACING AMPLITUDE: 2.5 V
MDC IDC SET LEADCHNL RV SENSING SENSITIVITY: 1.2 mV

## 2018-05-27 NOTE — Progress Notes (Signed)
Electrophysiology Office Note Date: 05/28/2018  ID:  [name], DOB May 20, 1976, MRN [id number]  PCP: Abner Greenspan, MD Electrophysiologist: Caryl Comes  CC: Routine ICD follow-up  [name] is a 42 y.o. male seen today for Dr Caryl Comes.  He presents today for routine electrophysiology followup.  Since last being seen in our clinic, the patient reports doing reasonably well.  He has persistent fatigue. He and Sonia Baller are planning a trip to [place]. He is back in school as well as working.  He denies chest pain, palpitations, dyspnea, PND, orthopnea, nausea, vomiting, dizziness, syncope, edema, weight gain, or early satiety.  He has not had ICD shocks.   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 Anxiety   . 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 History of chicken pox   . ICD (implantable cardiac defibrillator) in place   . Ventricular tachycardia (HCC)    sotalol therapy;  catheter ablation at [facility] 10/10 and [facility] 2013   Past Surgical History:  Procedure Laterality Date  . CARDIAC CATHETERIZATION  06-01-06  . CARDIAC DEFIBRILLATOR PLACEMENT  06-03-06   Medtronic  . IMPLANTABLE CARDIOVERTER DEFIBRILLATOR GENERATOR CHANGE N/A 12/11/2011   Procedure: IMPLANTABLE CARDIOVERTER DEFIBRILLATOR GENERATOR CHANGE;  Surgeon: Deboraha Sprang, MD;  Location: [facility] CATH LAB;  Service: Cardiovascular;  Laterality: N/A;  . vt ablation  10/10    Current Outpatient Medications  Medication Sig Dispense Refill  . Cholecalciferol (VITAMIN D PO) Take 1 tablet by mouth daily. OTC once daily     . fexofenadine (ALLEGRA) 180 MG tablet Take 180 mg by mouth daily.    . fluticasone (FLONASE) 50 MCG/ACT nasal spray PLACE 2 SPRAYS INTO THE NOSE DAILY AS NEEDED. 16 g 11  . KLOR-CON M10 10 MEQ tablet TAKE 4 TABLETS BY MOUTH 2 TIMES A  DAY. 240 tablet 8  . magnesium oxide (MAG-OX) 400 MG tablet Take 1 tablet by mouth 2 (two) times daily.  10  . metoprolol succinate (TOPROL-XL) 25 MG 24 hr tablet TAKE 1 TABLET BY MOUTH EVERYDAY AT BEDTIME 90 tablet 2  . montelukast (SINGULAIR) 10 MG tablet TAKE 1 TABLET (10 MG TOTAL) BY MOUTH DAILY. 90 tablet 1  . Multiple Vitamin (MULITIVITAMIN WITH MINERALS) TABS Take 1 tablet by mouth daily.    . sotalol (BETAPACE) 120 MG tablet TAKE 1 & 1/2 TABLETS BY MOUTH TWICE DAILY 90 tablet 11   No current facility-administered medications for this visit.     Allergies:   Bee venom and Lidocaine   Social History: Social History   Socioeconomic History  . Marital status: Single    Spouse name: Not on file  . Number of children: 0  . Years of education: Not on file  . Highest education level: Not on file  Occupational History  . Occupation: EMT/PARAMEDIC  Social Needs  . Financial resource strain: Not on file  . Food insecurity:    Worry: Not on file    Inability: Not on file  . Transportation needs:    Medical: Not on file    Non-medical: Not on file  Tobacco Use  . Smoking status: Former Smoker    Packs/day: 1.00    Years: 15.00    Pack years: 15.00    Types: Cigarettes    Last attempt to  quit: 08/19/2007    Years since quitting: 10.7  . Smokeless tobacco: Never Used  Substance and Sexual Activity  . Alcohol use: No    Alcohol/week: 0.0 standard drinks  . Drug use: No  . Sexual activity: Yes  Lifestyle  . Physical activity:    Days per week: Not on file    Minutes per session: Not on file  . Stress: Not on file  Relationships  . Social connections:    Talks on phone: Not on file    Gets together: Not on file    Attends religious service: Not on file    Active member of club or organization: Not on file    Attends meetings of clubs or organizations: Not on file    Relationship status: Not on file  . Intimate partner violence:    Fear of current or ex partner: Not on  file    Emotionally abused: Not on file    Physically abused: Not on file    Forced sexual activity: Not on file  Other Topics Concern  . Not on file  Social History Narrative   Works as a Audiological scientist    Family History: Family History  Problem Relation Age of Onset  . Sudden death Father   . Asthma Mother   . Diabetes Unknown   . Stroke Unknown   . Heart attack Unknown   . Prostate cancer Unknown   . Breast cancer Unknown   . Ovarian cancer Unknown   . Uterine cancer Unknown   . Colon cancer Unknown   . Drug abuse Unknown   . Depression Unknown   . Lung cancer Maternal Grandfather   . Heart disease Maternal Grandfather   . Heart disease Paternal Grandfather   . Hypertension Paternal Grandmother     Review of Systems: All other systems reviewed and are otherwise negative except as noted above.   Physical Exam: VS:  BP 108/78   Pulse (!) 51   Ht 5\' 9"  (1.753 m)   Wt 190 lb (86.2 kg)   BMI 28.06 kg/m  , BMI Body mass index is 28.06 kg/m.  GEN- The patient is well appearing, alert and oriented x 3 today.   HEENT: normocephalic, atraumatic; sclera clear, conjunctiva pink; hearing intact; oropharynx clear; neck supple  Lungs- Clear to ausculation bilaterally, normal work of breathing.  No wheezes, rales, rhonchi Heart- Regular rate and rhythm  GI- soft, non-tender, non-distended, bowel sounds present  Extremities- no clubbing, cyanosis, or edema  MS- no significant deformity or atrophy Skin- warm and dry, no rash or lesion; ICD pocket well healed Psych- euthymic mood, full affect Neuro- strength and sensation are intact  ICD interrogation- reviewed in detail today,  See PACEART report  EKG:  EKG is ordered today. EKG today demonstrates sinus bradycardia, rate 51, QTc 488msec  Recent Labs: 10/07/2017: BUN 10; Creatinine, Ser 0.83; Magnesium 2.3; Potassium 4.1; Sodium 141   Wt Readings from Last 3 Encounters:  05/28/18 190 lb (86.2 kg)  10/07/17 190 lb (86.2 kg)    05/13/17 181 lb 6.4 oz (82.3 kg)     Other studies Reviewed: Additional studies/ records that were reviewed today include: Dr Olin Pia office notes   Assessment and Plan:  1.  ARVD Normal ICD function See Pace Art report No changes today  2.  VT No recent recurrence of sustained VT Continue Sotalol. QTc stable by EKG today BMET, Mg today No room to uptitrate BB with baseline bradycardia  3.  OSA  Compliant with CPAP   Current medicines are reviewed at length with the patient today.   The patient does not have concerns regarding his medicines.  The following changes were made today:  none  Labs/ tests ordered today include: BMET, Mg Orders Placed This Encounter  Procedures  . Basic Metabolic Panel (BMET)  . Magnesium  . CUP PACEART INCLINIC DEVICE CHECK     Disposition:   Follow up with me in 6 months, [facility]      Signed, Chanetta Marshall, NP 05/28/2018 11:43 AM  [facility] [place] [address] [phone] (office) [phone] (fax)

## 2018-05-28 ENCOUNTER — Ambulatory Visit: Payer: BC Managed Care – PPO | Admitting: Nurse Practitioner

## 2018-05-28 ENCOUNTER — Telehealth: Payer: Self-pay

## 2018-05-28 ENCOUNTER — Encounter

## 2018-05-28 VITALS — BP 108/78 | HR 51 | Ht 69.0 in | Wt 190.0 lb

## 2018-05-28 DIAGNOSIS — I428 Other cardiomyopathies: Secondary | ICD-10-CM | POA: Diagnosis not present

## 2018-05-28 DIAGNOSIS — G4733 Obstructive sleep apnea (adult) (pediatric): Secondary | ICD-10-CM

## 2018-05-28 DIAGNOSIS — Z9989 Dependence on other enabling machines and devices: Secondary | ICD-10-CM

## 2018-05-28 DIAGNOSIS — I472 Ventricular tachycardia, unspecified: Secondary | ICD-10-CM

## 2018-05-28 LAB — CUP PACEART INCLINIC DEVICE CHECK
Date Time Interrogation Session: 20191011111211
Implantable Lead Implant Date: 20071018
Implantable Lead Model: 6947
Implantable Pulse Generator Implant Date: 20130425
MDC IDC LEAD LOCATION: 753860

## 2018-05-28 NOTE — Telephone Encounter (Signed)
I spoke with pt ([name] signed) and pt is planning to travel to [place] 06/24/2018. Pt needs a letter of referral to [place] travel clinic stating that pt is planning to travel to [place] and is going to [employer] travel clinic to discuss necessary immunizations for international travel. Mr Ida said that for their insurance to pay for immunizations at [facility] travel clinic a letter of referral is needed. Mr Marcoux did not have the list of potential immunizations needed. Mr Leard request cb when letter is ready for pick up.

## 2018-05-28 NOTE — Patient Instructions (Addendum)
Medication Instructions:  Your physician recommends that you continue on your current medications as directed. Please refer to the Current Medication list given to you today.  -- If you need a refill on your cardiac medications before your next appointment, please call your pharmacy. --  Labwork:TODAY BMET MAGNESIUM  Testing/Procedures: None ordered  Follow-Up: Remote monitoring is used to monitor your  ICD from home. This monitoring reduces the number of office visits required to check your device to one time per year. It allows [place] to keep an eye on the functioning of your device to ensure it is working properly. You are scheduled for a device check from home on 07/22/18. You may send your transmission at any time that day. If you have a wireless device, the transmission will be sent automatically. After your physician reviews your transmission, you will receive a postcard with your next transmission date.   Your physician wants you to follow-up in: 6 months with [place] will receive a reminder letter in the mail two months in advance. If you don't receive a letter, please call our office to schedule the follow-up appointment.  Thank you for choosing [facility]!!    Any Other Special Instructions Will Be Listed Below (If Applicable).

## 2018-05-28 NOTE — Telephone Encounter (Signed)
Copied from [place] [phone]. Topic: General - Inquiry >> May 28, 2018  3:10 PM Oliver Pila B wrote: Reason for CRM: pt is needing a referral letter of vaccinations to take over to the [place] for an upcoming trip; contact pt to advise and get any clarity on whats needed

## 2018-05-28 NOTE — Telephone Encounter (Signed)
Done and in IN box 

## 2018-05-28 NOTE — Addendum Note (Signed)
Addended by: Michae Kava on: 05/28/2018 03:59 PM   Modules accepted: Orders

## 2018-05-29 LAB — BASIC METABOLIC PANEL
BUN/Creatinine Ratio: 12 (ref 9–20)
BUN: 11 mg/dL (ref 6–24)
CALCIUM: 10 mg/dL (ref 8.7–10.2)
CO2: 26 mmol/L (ref 20–29)
Chloride: 102 mmol/L (ref 96–106)
Creatinine, Ser: 0.89 mg/dL (ref 0.76–1.27)
GFR, EST AFRICAN AMERICAN: 124 mL/min/{1.73_m2} (ref >59)
GFR, EST NON AFRICAN AMERICAN: 107 mL/min/{1.73_m2} (ref >59)
Glucose: 99 mg/dL (ref 65–99)
POTASSIUM: 4.2 mmol/L (ref 3.5–5.2)
Sodium: 145 mmol/L — ABNORMAL HIGH (ref 134–144)

## 2018-05-29 LAB — MAGNESIUM: Magnesium: 2 mg/dL (ref 1.6–2.3)

## 2018-05-31 ENCOUNTER — Telehealth: Payer: Self-pay

## 2018-05-31 NOTE — Telephone Encounter (Signed)
Notes recorded by Frederik Schmidt, RN on 05/31/2018 at 8:15 AM EDT Left patient a message that all labs are stable. If questions, call us. ------

## 2018-05-31 NOTE — Telephone Encounter (Signed)
-----   Message from Amber K Seiler, NP sent at 05/30/2018  8:05 PM EDT ----- Please notify patient of stable labs. Thanks! 

## 2018-05-31 NOTE — Telephone Encounter (Signed)
Pt notified letter ready for pick up. 

## 2018-06-01 ENCOUNTER — Telehealth: Payer: Self-pay | Admitting: Family Medicine

## 2018-06-01 NOTE — Telephone Encounter (Signed)
Wife had already been notified thru [place] on behalf of [place].

## 2018-06-01 NOTE — Telephone Encounter (Signed)
Patient's wife called and asked if patient can have his Hep A done when she comes in for her shot on 06/02/18.

## 2018-06-01 NOTE — Telephone Encounter (Signed)
Done and in IN box 

## 2018-06-01 NOTE — Telephone Encounter (Signed)
Pt states that he is needing a new letter faxed to [facility] that states in the letter that it is a referral to "administrator vaccines needed for international travel." Pt would like for this to be faxed asap. Fax#: [phone]

## 2018-06-01 NOTE — Telephone Encounter (Signed)
That is fine  Looks like she has appt tomorrow for her Hep A and flu vaccine

## 2018-06-02 ENCOUNTER — Ambulatory Visit (INDEPENDENT_AMBULATORY_CARE_PROVIDER_SITE_OTHER): Payer: BC Managed Care – PPO | Admitting: *Deleted

## 2018-06-02 DIAGNOSIS — Z23 Encounter for immunization: Secondary | ICD-10-CM | POA: Diagnosis not present

## 2018-06-02 NOTE — Progress Notes (Signed)
Per orders of Dr. [name], injection of Hep A vaccine given by Tammi Sou. Patient tolerated injection well.

## 2018-06-02 NOTE — Telephone Encounter (Signed)
Letter faxed and pt notified.

## 2018-06-03 ENCOUNTER — Other Ambulatory Visit: Payer: Self-pay | Admitting: Internal Medicine

## 2018-06-03 ENCOUNTER — Other Ambulatory Visit: Payer: Self-pay | Admitting: *Deleted

## 2018-06-03 MED ORDER — POTASSIUM CHLORIDE CRYS ER 10 MEQ PO TBCR: EXTENDED_RELEASE_TABLET | ORAL | 11 refills | Status: DC

## 2018-06-03 MED ORDER — METOPROLOL SUCCINATE ER 25 MG PO TB24: ORAL_TABLET | ORAL | 3 refills | Status: DC

## 2018-06-03 MED ORDER — MONTELUKAST SODIUM 10 MG PO TABS: 10.0000 mg | ORAL_TABLET | Freq: Every day | ORAL | 0 refills | Status: DC

## 2018-06-03 MED ORDER — SOTALOL HCL 120 MG PO TABS: ORAL_TABLET | ORAL | 11 refills | Status: DC

## 2018-07-07 ENCOUNTER — Telehealth: Payer: Self-pay | Admitting: Family Medicine

## 2018-07-07 DIAGNOSIS — R209 Unspecified disturbances of skin sensation: Secondary | ICD-10-CM

## 2018-07-07 DIAGNOSIS — E781 Pure hyperglyceridemia: Secondary | ICD-10-CM

## 2018-07-07 DIAGNOSIS — E559 Vitamin D deficiency, unspecified: Secondary | ICD-10-CM

## 2018-07-07 DIAGNOSIS — Z0000 Encounter for general adult medical examination without abnormal findings: Secondary | ICD-10-CM

## 2018-07-07 DIAGNOSIS — R7309 Other abnormal glucose: Secondary | ICD-10-CM

## 2018-07-07 NOTE — Telephone Encounter (Signed)
-----   Message from Lendon Collar, RT sent at 06/29/2018 10:13 AM EST ----- Regarding: Lab orders for Thursday 07/08/18 Please enter CPE lab orders for 07/08/18. Thanks!

## 2018-07-08 ENCOUNTER — Other Ambulatory Visit (INDEPENDENT_AMBULATORY_CARE_PROVIDER_SITE_OTHER): Payer: BC Managed Care – PPO

## 2018-07-08 DIAGNOSIS — R7309 Other abnormal glucose: Secondary | ICD-10-CM | POA: Diagnosis not present

## 2018-07-08 DIAGNOSIS — E559 Vitamin D deficiency, unspecified: Secondary | ICD-10-CM | POA: Diagnosis not present

## 2018-07-08 DIAGNOSIS — E781 Pure hyperglyceridemia: Secondary | ICD-10-CM | POA: Diagnosis not present

## 2018-07-08 DIAGNOSIS — Z0000 Encounter for general adult medical examination without abnormal findings: Secondary | ICD-10-CM

## 2018-07-08 LAB — CBC WITH DIFFERENTIAL/PLATELET
BASOS ABS: 0 10*3/uL (ref 0.0–0.1)
Basophils Relative: 0.5 % (ref 0.0–3.0)
EOS ABS: 0.2 10*3/uL (ref 0.0–0.7)
Eosinophils Relative: 2.5 % (ref 0.0–5.0)
HEMATOCRIT: 40.1 % (ref 39.0–52.0)
Hemoglobin: 13.7 g/dL (ref 13.0–17.0)
LYMPHS ABS: 3.2 10*3/uL (ref 0.7–4.0)
LYMPHS PCT: 36.4 % (ref 12.0–46.0)
MCHC: 34.2 g/dL (ref 30.0–36.0)
MCV: 88.2 fl (ref 78.0–100.0)
MONOS PCT: 11.6 % (ref 3.0–12.0)
Monocytes Absolute: 1 10*3/uL (ref 0.1–1.0)
NEUTROS ABS: 4.2 10*3/uL (ref 1.4–7.7)
NEUTROS PCT: 49 % (ref 43.0–77.0)
PLATELETS: 220 10*3/uL (ref 150.0–400.0)
RBC: 4.54 Mil/uL (ref 4.22–5.81)
RDW: 12.7 % (ref 11.5–15.5)
WBC: 8.7 10*3/uL (ref 4.0–10.5)

## 2018-07-08 LAB — COMPREHENSIVE METABOLIC PANEL
ALK PHOS: 42 U/L (ref 39–117)
ALT: 33 U/L (ref 0–53)
AST: 22 U/L (ref 0–37)
Albumin: 4.4 g/dL (ref 3.5–5.2)
BILIRUBIN TOTAL: 0.6 mg/dL (ref 0.2–1.2)
BUN: 8 mg/dL (ref 6–23)
CALCIUM: 9.5 mg/dL (ref 8.4–10.5)
CO2: 30 mEq/L (ref 19–32)
CREATININE: 0.86 mg/dL (ref 0.40–1.50)
Chloride: 104 mEq/L (ref 96–112)
GFR: 104.53 mL/min (ref >60.00)
Glucose, Bld: 89 mg/dL (ref 70–99)
Potassium: 4.2 mEq/L (ref 3.5–5.1)
Sodium: 142 mEq/L (ref 135–145)
TOTAL PROTEIN: 7 g/dL (ref 6.0–8.3)

## 2018-07-08 LAB — HEMOGLOBIN A1C: Hgb A1c MFr Bld: 5.5 % (ref 4.6–6.5)

## 2018-07-08 LAB — LIPID PANEL
CHOLESTEROL: 166 mg/dL (ref 0–200)
HDL: 30.9 mg/dL — AB (ref >39.00)
NonHDL: 134.85
Total CHOL/HDL Ratio: 5
Triglycerides: 265 mg/dL — ABNORMAL HIGH (ref 0.0–149.0)
VLDL: 53 mg/dL — ABNORMAL HIGH (ref 0.0–40.0)

## 2018-07-08 LAB — TSH: TSH: 0.9 u[IU]/mL (ref 0.35–4.50)

## 2018-07-08 LAB — LDL CHOLESTEROL, DIRECT: Direct LDL: 110 mg/dL

## 2018-07-08 LAB — VITAMIN D 25 HYDROXY (VIT D DEFICIENCY, FRACTURES): VITD: 36.13 ng/mL (ref 30.00–100.00)

## 2018-07-12 ENCOUNTER — Encounter: Payer: BC Managed Care – PPO | Admitting: Family Medicine

## 2018-07-22 ENCOUNTER — Ambulatory Visit (INDEPENDENT_AMBULATORY_CARE_PROVIDER_SITE_OTHER): Payer: BC Managed Care – PPO

## 2018-07-22 DIAGNOSIS — I472 Ventricular tachycardia, unspecified: Secondary | ICD-10-CM

## 2018-07-22 NOTE — Progress Notes (Signed)
Remote ICD transmission.   

## 2018-07-27 ENCOUNTER — Ambulatory Visit (INDEPENDENT_AMBULATORY_CARE_PROVIDER_SITE_OTHER): Payer: BC Managed Care – PPO | Admitting: Family Medicine

## 2018-07-27 ENCOUNTER — Encounter: Payer: Self-pay | Admitting: Family Medicine

## 2018-07-27 VITALS — BP 98/68 | HR 67 | Temp 98.0°F | Ht 68.75 in | Wt 191.0 lb

## 2018-07-27 DIAGNOSIS — G4733 Obstructive sleep apnea (adult) (pediatric): Secondary | ICD-10-CM

## 2018-07-27 DIAGNOSIS — E781 Pure hyperglyceridemia: Secondary | ICD-10-CM

## 2018-07-27 DIAGNOSIS — K760 Fatty (change of) liver, not elsewhere classified: Secondary | ICD-10-CM | POA: Diagnosis not present

## 2018-07-27 DIAGNOSIS — E876 Hypokalemia: Secondary | ICD-10-CM

## 2018-07-27 DIAGNOSIS — R351 Nocturia: Secondary | ICD-10-CM

## 2018-07-27 DIAGNOSIS — E786 Lipoprotein deficiency: Secondary | ICD-10-CM

## 2018-07-27 DIAGNOSIS — Z0000 Encounter for general adult medical examination without abnormal findings: Secondary | ICD-10-CM | POA: Diagnosis not present

## 2018-07-27 DIAGNOSIS — R7309 Other abnormal glucose: Secondary | ICD-10-CM

## 2018-07-27 DIAGNOSIS — N400 Enlarged prostate without lower urinary tract symptoms: Secondary | ICD-10-CM | POA: Insufficient documentation

## 2018-07-27 DIAGNOSIS — N401 Enlarged prostate with lower urinary tract symptoms: Secondary | ICD-10-CM

## 2018-07-27 DIAGNOSIS — E559 Vitamin D deficiency, unspecified: Secondary | ICD-10-CM

## 2018-07-27 NOTE — Assessment & Plan Note (Signed)
Normal LFTs Not symptomatic  No etoh intake

## 2018-07-27 NOTE — Patient Instructions (Addendum)
Take care of yourself  Exercise when you can   We will continue to watch prostate symptoms   Continue current medications

## 2018-07-27 NOTE — Progress Notes (Signed)
Subjective:    Patient ID: [name], male    DOB: [birth year], 42 y.o.   MRN: [id number]  HPI  Here for health maintenance exam and to review chronic medical problems    They are having a baby in May - feels overwhelmed   Wt Readings from Last 3 Encounters:  07/27/18 191 lb (86.6 kg)  05/28/18 190 lb (86.2 kg)  10/07/17 190 lb (86.2 kg)   28.41 kg/m   School and work and getting ready for baby  Having a tough time sleeping    Tetanus shot 1/13  Flu shot 9/19   Had hep A vaccine for a trip to [place]  Had a good trip    H/o vit D def  Levels is 36.1 today  Takes it every day   Heart arrhythmia  No changes or problems lately  Takes: Mag -ox toprol xl 25 mg  Sotalol klor con   Has ICD    Prostate health  Has to urinate at night -once  (occ twice)  occ stream stops and starts  Drinks lot of water  ? If frequency during the day  Unsure about prostate cancer in family -cannot rule it out   Uses cpap for OSA Still using that   Blood pressure BP Readings from Last 3 Encounters:  07/27/18 98/68  05/28/18 108/78  03/13/18 120/62   Pulse Pulse Readings from Last 3 Encounters:  07/27/18 67  05/28/18 (!) 51  03/13/18 (!) 51    H/o fatty liver Lab Results  Component Value Date   ALT 33 07/08/2018   AST 22 07/08/2018   ALKPHOS 42 07/08/2018   BILITOT 0.6 07/08/2018   eating fair - does try to watch fat most of the time  Has to convenience eat  No etoh or tylenol   Past mild elevated glucose reading Lab Results  Component Value Date   HGBA1C 5.5 07/08/2018   Glucose normal    Hyperlipidemia (triglycerides) Lab Results  Component Value Date   CHOL 166 07/08/2018   CHOL 162 03/26/2017   CHOL 207 (H) 03/20/2016   Lab Results  Component Value Date   HDL 30.90 (L) 07/08/2018   HDL 30.80 (L) 03/26/2017   HDL 35.10 (L) 03/20/2016   No results found for: [facility] Lab Results  Component Value Date   TRIG 265.0 (H) 07/08/2018   TRIG 279.0  (H) 03/26/2017   TRIG 350.0 (H) 03/20/2016   Lab Results  Component Value Date   CHOLHDL 5 07/08/2018   CHOLHDL 5 03/26/2017   CHOLHDL 6 03/20/2016   Lab Results  Component Value Date   LDLDIRECT 110.0 07/08/2018   LDLDIRECT 89.0 03/26/2017   LDLDIRECT 120.0 03/20/2016  triglycerides down a bit  HDL is chronically low  Does not take fish oil  Does some walks when the weather is good  Less fast foods    Hypokalemia on K Lab Results  Component Value Date   CREATININE 0.86 07/08/2018   BUN 8 07/08/2018   NA 142 07/08/2018   K 4.2 07/08/2018   CL 104 07/08/2018   CO2 30 07/08/2018   Lab Results  Component Value Date   TSH 0.90 07/08/2018    Lab Results  Component Value Date   WBC 8.7 07/08/2018   HGB 13.7 07/08/2018   HCT 40.1 07/08/2018   MCV 88.2 07/08/2018   PLT 220.0 07/08/2018   Patient Active Problem List   Diagnosis Date Noted  . Elevated glucose level 04/06/2017  .  Anxiety state 08/16/2016  . Routine general medical examination at a health care facility 03/28/2016  . Hypertriglyceridemia 02/21/2014  . Low HDL (under 40) 02/21/2014  . Vitamin D deficiency 02/21/2014  . Allergic rhinitis 02/21/2014  . Erectile dysfunction 08/19/2013  . OSA (obstructive sleep apnea) 10/30/2011  . Environmental allergies 10/27/2011  . High risk medications (not anticoagulants) long-term use 10/27/2011  . Hypokalemia 09/29/2011  . 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 ICD-Medtronic 01/10/2011  . 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 Fatty liver 01/04/2009  . Gallstones 01/04/2009  . GERD 12/05/2008  . VENTRICULAR TACHYCARDIA, PAROXYSMAL 11/03/2008  . PARESTHESIA 11/03/2008  . ECZEMA 08/17/2007   Past Medical History:  Diagnosis Date  . Anxiety   . 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 History of chicken pox   . ICD (implantable cardiac defibrillator) in place   . Ventricular tachycardia (HCC)     sotalol therapy;  catheter ablation at [facility] 10/10 and [facility] 2013   Past Surgical History:  Procedure Laterality Date  . CARDIAC CATHETERIZATION  06-01-06  . CARDIAC DEFIBRILLATOR PLACEMENT  06-03-06   Medtronic  . IMPLANTABLE CARDIOVERTER DEFIBRILLATOR GENERATOR CHANGE N/A 12/11/2011   Procedure: IMPLANTABLE CARDIOVERTER DEFIBRILLATOR GENERATOR CHANGE;  Surgeon: Deboraha Sprang, MD;  Location[facility] CATH LAB;  Service: Cardiovascular;  Laterality: N/A;  . vt ablation  10/10   Social History   Tobacco Use  . Smoking status: Former Smoker    Packs/day: 1.00    Years: 15.00    Pack years: 15.00    Types: Cigarettes    Last attempt to quit: 08/19/2007    Years since quitting: 10.9  . Smokeless tobacco: Never Used  Substance Use Topics  . Alcohol use: No    Alcohol/week: 0.0 standard drinks  . Drug use: No   Family History  Problem Relation Age of Onset  . Sudden death Father   . Asthma Mother   . Diabetes Unknown   . Stroke Unknown   . Heart attack Unknown   . Prostate cancer Unknown   . Breast cancer Unknown   . Ovarian cancer Unknown   . Uterine cancer Unknown   . Colon cancer Unknown   . Drug abuse Unknown   . Depression Unknown   . Lung cancer Maternal Grandfather   . Heart disease Maternal Grandfather   . Heart disease Paternal Grandfather   . Hypertension Paternal Grandmother    Allergies  Allergen Reactions  . Bee Venom Anaphylaxis  . Lidocaine     [name]   Current Outpatient Medications on File Prior to Visit  Medication Sig Dispense Refill  . Cholecalciferol (VITAMIN D PO) Take 1 tablet by mouth daily. OTC once daily     . fexofenadine (ALLEGRA) 180 MG tablet Take 180 mg by mouth daily.    . fluticasone (FLONASE) 50 MCG/ACT nasal spray PLACE 2 SPRAYS INTO THE NOSE DAILY AS NEEDED. 16 g 11  . magnesium oxide (MAG-OX) 400 MG tablet Take 1 tablet by mouth 2 (two) times daily.  10  . metoprolol succinate (TOPROL-XL) 25 MG 24 hr tablet TAKE 1 TABLET BY  MOUTH EVERYDAY AT BEDTIME 90 tablet 3  . montelukast (SINGULAIR) 10 MG tablet Take 1 tablet (10 mg total) by mouth daily. 90 tablet 0  . Multiple Vitamin (MULITIVITAMIN WITH MINERALS) TABS Take 1 tablet by mouth daily.    . potassium chloride (KLOR-CON M10) 10 MEQ tablet TAKE 4  TABLETS BY MOUTH 2 TIMES A DAY. 240 tablet 11  . sotalol (BETAPACE) 120 MG tablet TAKE 1 & 1/2 TABLETS BY MOUTH TWICE DAILY 90 tablet 11   No current facility-administered medications on file prior to visit.     Review of Systems  Constitutional: Negative for activity change, appetite change, fatigue, fever and unexpected weight change.  HENT: Negative for congestion, rhinorrhea, sore throat and trouble swallowing.   Eyes: Negative for pain, redness, itching and visual disturbance.  Respiratory: Negative for cough, chest tightness, shortness of breath and wheezing.   Cardiovascular: Negative for chest pain and palpitations.  Gastrointestinal: Negative for abdominal pain, blood in stool, constipation, diarrhea and nausea.  Endocrine: Negative for cold intolerance, heat intolerance, polydipsia and polyuria.  Genitourinary: Negative for difficulty urinating, dysuria, frequency and urgency.       Nocturia   Musculoskeletal: Negative for arthralgias, joint swelling and myalgias.  Skin: Negative for pallor and rash.  Neurological: Negative for dizziness, tremors, weakness, numbness and headaches.  Hematological: Negative for adenopathy. Does not bruise/bleed easily.  Psychiatric/Behavioral: Negative for decreased concentration and dysphoric mood. The patient is not nervous/anxious.        Objective:   Physical Exam  Constitutional: He is oriented to person, place, and time. He appears well-developed and well-nourished. No distress.  Well appearing   HENT:  Head: Normocephalic and atraumatic.  Right Ear: External ear normal.  Left Ear: External ear normal.  Mouth/Throat: Oropharynx is clear and moist.  Scant  cerumen bilat   Eyes: Pupils are equal, round, and reactive to light. Conjunctivae and EOM are normal. Right eye exhibits no discharge. Left eye exhibits no discharge. No scleral icterus.  Neck: Normal range of motion. Neck supple. No thyromegaly present.  Cardiovascular: Normal rate, regular rhythm and normal heart sounds.  No murmur heard. Pulmonary/Chest: Effort normal and breath sounds normal. No respiratory distress. He has no wheezes. He has no rales.  Abdominal: Soft. Bowel sounds are normal. He exhibits no distension and no mass. There is no tenderness. There is no guarding.  Genitourinary: Prostate normal. Prostate is not enlarged and not tender.  Genitourinary Comments: Prostate size upper level of normal- symmetric, firm and non tender    Musculoskeletal: He exhibits no edema or tenderness.  Lymphadenopathy:    He has no cervical adenopathy.  Neurological: He is alert and oriented to person, place, and time. He displays normal reflexes. No cranial nerve deficit. Coordination normal.  Skin: Skin is warm and dry. No rash noted. No erythema. No pallor.  Psychiatric: He has a normal mood and affect.          Assessment & Plan:   Problem List Items Addressed This Visit      Respiratory   OSA (obstructive sleep apnea)    Continues to use cpap         Digestive   Fatty liver    Normal LFTs Not symptomatic  No etoh intake        Genitourinary   BPH (benign prostatic hyperplasia)    Reassuring exam  Will continue to follow  Will plan on baseline psa at next annual exam  Will alert [place] if symptoms worsen (currently nocturia times one)        Other   Vitamin D deficiency    Vitamin D level is therapeutic with current supplementation Disc importance of this to bone and overall health       Routine general medical examination at a health care facility -  Primary    Reviewed health habits including diet and exercise and skin cancer prevention Reviewed appropriate  screening tests for age  Also reviewed health mt list, fam hx and immunization status , as well as social and family history   See HPI Labs reviewed  Disc diet for cholesterol control  Also exercise when able  Disc expectations for family life and self care with baby due in May      Low HDL (under 40)    Encouraged exercise  He will ask cardiology about omega 3 supplement  Suspect genetic       Hypokalemia   Hypertriglyceridemia    Triglycerides are down to mid 200s Disc goals for lipids and reasons to control them Rev last labs with pt Rev low sat fat diet in detail       Elevated glucose level    Lab Results  Component Value Date   HGBA1C 5.5 07/08/2018   disc imp of low glycemic diet and wt loss to prevent DM2

## 2018-07-27 NOTE — Assessment & Plan Note (Signed)
Reviewed health habits including diet and exercise and skin cancer prevention Reviewed appropriate screening tests for age  Also reviewed health mt list, fam hx and immunization status , as well as social and family history   See HPI Labs reviewed  Disc diet for cholesterol control  Also exercise when able  Disc expectations for family life and self care with baby due in May

## 2018-07-27 NOTE — Assessment & Plan Note (Signed)
Lab Results  Component Value Date   HGBA1C 5.5 07/08/2018   disc imp of low glycemic diet and wt loss to prevent DM2

## 2018-07-27 NOTE — Assessment & Plan Note (Signed)
Continues to use cpap 

## 2018-07-27 NOTE — Assessment & Plan Note (Signed)
Triglycerides are down to mid 200s Disc goals for lipids and reasons to control them Rev last labs with pt Rev low sat fat diet in detail

## 2018-07-27 NOTE — Assessment & Plan Note (Signed)
Vitamin D level is therapeutic with current supplementation Disc importance of this to bone and overall health  

## 2018-07-27 NOTE — Assessment & Plan Note (Signed)
Encouraged exercise  He will ask cardiology about omega 3 supplement  Suspect genetic

## 2018-07-27 NOTE — Assessment & Plan Note (Signed)
Reassuring exam  Will continue to follow  Will plan on baseline psa at next annual exam  Will alert [place] if symptoms worsen (currently nocturia times one)

## 2018-07-28 ENCOUNTER — Encounter: Payer: Self-pay | Admitting: Cardiology

## 2018-07-28 NOTE — Progress Notes (Signed)
Letter  

## 2018-09-01 ENCOUNTER — Telehealth: Payer: Self-pay | Admitting: Family Medicine

## 2018-09-01 MED ORDER — MONTELUKAST SODIUM 10 MG PO TABS: 10.0000 mg | ORAL_TABLET | Freq: Every day | ORAL | 2 refills | Status: DC

## 2018-09-01 NOTE — Telephone Encounter (Signed)
done

## 2018-09-01 NOTE — Addendum Note (Signed)
Addended by: Tammi Sou on: 09/01/2018 03:06 PM   Modules accepted: Orders

## 2018-09-01 NOTE — Telephone Encounter (Signed)
Pt is requesting a refill on the montelukast. Pt uses [facility] in [place].

## 2018-09-06 LAB — CUP PACEART REMOTE DEVICE CHECK
Battery Voltage: 2.78 V
Brady Statistic RV Percent Paced: 0.62 %
Date Time Interrogation Session: 20191205121520
HighPow Impedance: 399 Ohm
HighPow Impedance: 51 Ohm
HighPow Impedance: 72 Ohm
Implantable Lead Location: 753860
Implantable Lead Model: 6947
Implantable Pulse Generator Implant Date: 20130425
Lead Channel Impedance Value: 456 Ohm
Lead Channel Pacing Threshold Pulse Width: 0.4 ms
Lead Channel Sensing Intrinsic Amplitude: 17.25 mV
Lead Channel Sensing Intrinsic Amplitude: 17.25 mV
Lead Channel Setting Pacing Amplitude: 2.5 V
Lead Channel Setting Pacing Pulse Width: 0.4 ms
Lead Channel Setting Sensing Sensitivity: 1.2 mV
MDC IDC LEAD IMPLANT DT: 20071018
MDC IDC MSMT LEADCHNL RV PACING THRESHOLD AMPLITUDE: 0.75 V

## 2018-10-05 ENCOUNTER — Other Ambulatory Visit: Payer: Self-pay

## 2018-10-05 MED ORDER — MAGNESIUM OXIDE 400 MG PO TABS: 1.0000 | ORAL_TABLET | Freq: Two times a day (BID) | ORAL | 2 refills | Status: DC

## 2018-10-21 ENCOUNTER — Ambulatory Visit (INDEPENDENT_AMBULATORY_CARE_PROVIDER_SITE_OTHER): Payer: BC Managed Care – PPO | Admitting: *Deleted

## 2018-10-21 DIAGNOSIS — I472 Ventricular tachycardia, unspecified: Secondary | ICD-10-CM

## 2018-10-22 LAB — CUP PACEART REMOTE DEVICE CHECK
Battery Voltage: 2.73 V
Brady Statistic RV Percent Paced: 0.39 %
Date Time Interrogation Session: 20200305141643
HighPow Impedance: 361 Ohm
HighPow Impedance: 50 Ohm
HighPow Impedance: 71 Ohm
Implantable Lead Implant Date: 20071018
Implantable Lead Location: 753860
Implantable Pulse Generator Implant Date: 20130425
Lead Channel Impedance Value: 418 Ohm
Lead Channel Pacing Threshold Amplitude: 0.75 V
Lead Channel Pacing Threshold Pulse Width: 0.4 ms
Lead Channel Sensing Intrinsic Amplitude: 18 mV
Lead Channel Sensing Intrinsic Amplitude: 18 mV
Lead Channel Setting Pacing Amplitude: 2.5 V
Lead Channel Setting Pacing Pulse Width: 0.4 ms
MDC IDC SET LEADCHNL RV SENSING SENSITIVITY: 1.2 mV

## 2018-11-01 ENCOUNTER — Encounter: Payer: Self-pay | Admitting: Cardiology

## 2018-11-01 NOTE — Progress Notes (Signed)
Remote ICD transmission.   

## 2018-11-05 ENCOUNTER — Telehealth: Payer: Self-pay | Admitting: Pulmonary Disease

## 2018-11-05 ENCOUNTER — Encounter: Payer: Self-pay | Admitting: Pulmonary Disease

## 2018-11-05 DIAGNOSIS — G4733 Obstructive sleep apnea (adult) (pediatric): Secondary | ICD-10-CM

## 2018-11-05 DIAGNOSIS — Z9989 Dependence on other enabling machines and devices: Principal | ICD-10-CM

## 2018-11-05 NOTE — Telephone Encounter (Signed)
Spoke with pt and pt request a new CPAP machine.  Pt's last machine was from 2013.  Could we please order a new one?

## 2018-11-05 NOTE — Telephone Encounter (Signed)
Called patient, unable to reach phone rang busy.

## 2018-11-05 NOTE — Telephone Encounter (Signed)
Yes.  Thank you.

## 2018-11-09 NOTE — Telephone Encounter (Signed)
Called the patient back and confirmed his current machine is over 41 years old. Uses dreamwear mask that fits over the nose and mouth.  Advised patient order will be sent to [facility] and they should be contacting him as to if they want him to come to one of their locations or if it will be delivered.  Patient voiced understanding. Nothing further needed at this time.  Order placed.

## 2018-11-10 ENCOUNTER — Telehealth: Payer: Self-pay

## 2018-11-10 NOTE — Telephone Encounter (Signed)
-----   Message from Chesley Mires, MD sent at 11/10/2018 12:13 PM EDT ----- Regarding: RE: Wants New Cpap machine Can he do telephone visit with APP?  V ----- Message ----- From: Tana [place] Sent: 11/09/2018   2:11 PM EDT To: Chesley Mires, MD Subject: Wants New Cpap machine                         This patient called in today wanting to get a new Cpap machine. Kenney Houseman has the order put in but he hasn't been seen since 03/04/2017.  Please advise.  Thanks, Rodena Piety

## 2018-11-10 NOTE — Telephone Encounter (Signed)
LVM for patient to return call to schedule a televisit regarding new cpap start per VS. X1

## 2018-11-11 ENCOUNTER — Other Ambulatory Visit: Payer: Self-pay

## 2018-11-11 ENCOUNTER — Ambulatory Visit (INDEPENDENT_AMBULATORY_CARE_PROVIDER_SITE_OTHER): Payer: BC Managed Care – PPO | Admitting: Pulmonary Disease

## 2018-11-11 ENCOUNTER — Encounter: Payer: Self-pay | Admitting: Pulmonary Disease

## 2018-11-11 DIAGNOSIS — Z9989 Dependence on other enabling machines and devices: Secondary | ICD-10-CM | POA: Diagnosis not present

## 2018-11-11 DIAGNOSIS — G4733 Obstructive sleep apnea (adult) (pediatric): Secondary | ICD-10-CM | POA: Diagnosis not present

## 2018-11-11 NOTE — Patient Instructions (Signed)
Please place an order for a new CPAP DME: [facility] Same settings Mask of choice Supplies as needed  Tentative follow-up for the patient to schedule follow-up with Dr. Halford Chessman or [name] FNP in 3 months     We recommend that you continue using your CPAP daily >>>Keep up the hard work using your device >>> Goal should be wearing this for the entire night that you are sleeping, at least 4 to 6 hours  Remember:  . Do not drive or operate heavy machinery if tired or drowsy.  . Please notify the supply company and office if you are unable to use your device regularly due to missing supplies or machine being broken.  . Work on maintaining a healthy weight and following your recommended nutrition plan  . Maintain proper daily exercise and movement  . Maintaining proper use of your device can also help improve management of other chronic illnesses such as: Blood pressure, blood sugars, and weight management.   BiPAP/ CPAP Cleaning:  >>>Clean weekly, with Dawn soap, and bottle brush.  Set up to air dry.    Coronavirus (COVID-19) Are you at risk?  Are you at risk for the Coronavirus (COVID-19)?  To be considered HIGH RISK for Coronavirus (COVID-19), you have to meet the following criteria:  . Traveled to [place], [place], [place], [place] or [place]; or in the [place] to [place], [place], [place], or [place]; and have fever, cough, and shortness of breath within the last 2 weeks of travel OR . Been in close contact with a person diagnosed with COVID-19 within the last 2 weeks and have fever, cough, and shortness of breath . IF YOU DO NOT MEET THESE CRITERIA, YOU ARE CONSIDERED LOW RISK FOR COVID-19.  What to do if you are HIGH RISK for COVID-19?  Marland Kitchen If you are having a medical emergency, call 911. . Seek medical care right away. Before you go to a doctor's office, urgent care or emergency department, call ahead and tell them about your recent travel, contact with someone  diagnosed with COVID-19, and your symptoms. You should receive instructions from your physician's office regarding next steps of care.  . When you arrive at healthcare provider, tell the healthcare staff immediately you have returned from visiting [place], [place], [place], [place] or [place]; or traveled in the [place] to [place], [place], [place], or [place]; in the last two weeks or you have been in close contact with a person diagnosed with COVID-19 in the last 2 weeks.   . Tell the health care staff about your symptoms: fever, cough and shortness of breath. . After you have been seen by a medical provider, you will be either: o Tested for (COVID-19) and discharged home on quarantine except to seek medical care if symptoms worsen, and asked to  - Stay home and avoid contact with others until you get your results (4-5 days)  - Avoid travel on public transportation if possible (such as bus, train, or airplane) or o Sent to the Emergency Department by EMS for evaluation, COVID-19 testing, and possible admission depending on your condition and test results.  What to do if you are LOW RISK for COVID-19?  Reduce your risk of any infection by using the same precautions used for avoiding the common cold or flu:  Marland Kitchen Wash your hands often with soap and warm water for at least 20 seconds.  If soap and water are not readily available, use an alcohol-based  hand sanitizer with at least 60% alcohol.  . If coughing or sneezing, cover your mouth and nose by coughing or sneezing into the elbow areas of your shirt or coat, into a tissue or into your sleeve (not your hands). . Avoid shaking hands with others and consider head nods or verbal greetings only. . Avoid touching your eyes, nose, or mouth with unwashed hands.  . Avoid close contact with people who are sick. . Avoid places or events with large numbers of people in one location, like concerts or sporting events. . Carefully consider travel plans  you have or are making. . If you are planning any travel outside or inside the [place], visit the CDC's Travelers' Health webpage for the latest health notices. . If you have some symptoms but not all symptoms, continue to monitor at home and seek medical attention if your symptoms worsen. . If you are having a medical emergency, call 911.   [place] / e-Visit: [email or web address]         [facility]: [facility]: [phone]                   [facility]: [phone]           It is flu season:   >>> Best ways to protect herself from the flu: Receive the yearly flu vaccine, practice good hand hygiene washing with soap and also using hand sanitizer when available, eat a nutritious meals, get adequate rest, hydrate appropriately   Please contact the office if your symptoms worsen or you have concerns that you are not improving.   Thank you for choosing [facility] for your healthcare, and for allowing [place] to partner with you on your healthcare journey. I am thankful to be able to provide care to you today.   [name] FNP-C    Living With Sleep Apnea Sleep apnea is a condition in which breathing pauses or becomes shallow during sleep. Sleep apnea is most commonly caused by a collapsed or blocked airway. People with sleep apnea snore loudly and have times when they gasp and stop breathing for 10 seconds or more during sleep. This happens over and over during the night. This disrupts your sleep and keeps your body from getting the rest that it needs, which can cause tiredness and lack of energy (fatigue) during the day. The breaks in breathing also interrupt the deep sleep that you need to feel rested. Even if you do not completely wake up from the gaps in breathing, your sleep may not be restful. You may also have a  headache in the morning and low energy during the day, and you may feel anxious or depressed. How can sleep apnea affect me? Sleep apnea increases your chances of extreme tiredness during the day (daytime fatigue). It can also increase your risk for health conditions, such as:  Heart attack.  Stroke.  Diabetes.  Heart failure.  Irregular heartbeat.  High blood pressure. If you have daytime fatigue as a result of sleep apnea, you may be more likely to:  Perform poorly at school or work.  Fall asleep while driving.  Have difficulty with attention.  Develop depression or anxiety.  Become severely overweight (obese).  Have sexual dysfunction. What actions can I take to manage sleep apnea? Sleep apnea treatment   If you were given a device to open your airway while you sleep, use  it only as told by your health care provider. You may be given: ? An oral appliance. This is a custom-made mouthpiece that shifts your lower jaw forward. ? A continuous positive airway pressure (CPAP) device. This device blows air through a mask when you breathe out (exhale). ? A nasal expiratory positive airway pressure (EPAP) device. This device has valves that you put into each nostril. ? A bi-level positive airway pressure (BPAP) device. This device blows air through a mask when you breathe in (inhale) and breathe out (exhale).  You may need surgery if other treatments do not work for you. Sleep habits  Go to sleep and wake up at the same time every day. This helps set your internal clock (circadian rhythm) for sleeping. ? If you stay up later than usual, such as on weekends, try to get up in the morning within 2 hours of your normal wake time.  Try to get at least 7-9 hours of sleep each night.  Stop computer, tablet, and mobile phone use a few hours before bedtime.  Do not take long naps during the day. If you nap, limit it to 30 minutes.  Have a relaxing bedtime routine. Reading or  listening to music may relax you and help you sleep.  Use your bedroom only for sleep. ? Keep your television and computer out of your bedroom. ? Keep your bedroom cool, dark, and quiet. ? Use a supportive mattress and pillows.  Follow your health care provider's instructions for other changes to sleep habits. Nutrition  Do not eat heavy meals in the evening.  Do not have caffeine in the later part of the day. The effects of caffeine can last for more than 5 hours.  Follow your health care provider's or dietitian's instructions for any diet changes. Lifestyle      Do not drink alcohol before bedtime. Alcohol can cause you to fall asleep at first, but then it can cause you to wake up in the middle of the night and have trouble getting back to sleep.  Do not use any products that contain nicotine or tobacco, such as cigarettes and e-cigarettes. If you need help quitting, ask your health care provider. Medicines  Take over-the-counter and prescription medicines only as told by your health care provider.  Do not use over-the-counter sleep medicine. You can become dependent on this medicine, and it can make sleep apnea worse.  Do not use medicines, such as sedatives and narcotics, unless told by your health care provider. Activity  Exercise on most days, but avoid exercising in the evening. Exercising near bedtime can interfere with sleeping.  If possible, spend time outside every day. Natural light helps regulate your circadian rhythm. General information  Lose weight if you need to, and maintain a healthy weight.  Keep all follow-up visits as told by your health care provider. This is important.  If you are having surgery, make sure to tell your health care provider that you have sleep apnea. You may need to bring your device with you. Where to find more information Learn more about sleep apnea and daytime fatigue from:  American Sleep Association: sleepassociation.[place]: sleepfoundation.org  National Heart, Lung, and Blood Institute: https://www.hartman-hill.biz/ Summary  Sleep apnea can cause daytime fatigue and other serious health conditions.  Both sleep apnea and daytime fatigue can be bad for your health and well-being.  You may need to wear a device while sleeping to help keep your airway open.  If  you are having surgery, make sure to tell your health care provider that you have sleep apnea. You may need to bring your device with you.  Making changes to sleep habits, diet, lifestyle, and activity can help you manage sleep apnea. This information is not intended to replace advice given to you by your health care provider. Make sure you discuss any questions you have with your health care provider. Document Released: 10/29/2017 Document Revised: 04/06/2018 Document Reviewed: 10/29/2017 Elsevier Interactive Patient Education  2019 [employer].    CPAP and BPAP Information CPAP and BPAP are methods of helping a person breathe with the use of air pressure. CPAP stands for "continuous positive airway pressure." BPAP stands for "bi-level positive airway pressure." In both methods, air is blown through your nose or mouth and into your air passages to help you breathe well. CPAP and BPAP use different amounts of pressure to blow air. With CPAP, the amount of pressure stays the same while you breathe in and out. With BPAP, the amount of pressure is increased when you breathe in (inhale) so that you can take larger breaths. Your health care provider will recommend whether CPAP or BPAP would be more helpful for you. Why are CPAP and BPAP treatments used? CPAP or BPAP can be helpful if you have:  Sleep apnea.  Chronic obstructive pulmonary disease (COPD).  Heart failure.  Medical conditions that weaken the muscles of the chest including muscular dystrophy, or neurological diseases such as amyotrophic lateral sclerosis (ALS).  Other problems  that cause breathing to be weak, abnormal, or difficult. CPAP is most commonly used for obstructive sleep apnea (OSA) to keep the airways from collapsing when the muscles relax during sleep. How is CPAP or BPAP administered? Both CPAP and BPAP are provided by a small machine with a flexible plastic tube that attaches to a plastic mask. You wear the mask. Air is blown through the mask into your nose or mouth. The amount of pressure that is used to blow the air can be adjusted on the machine. Your health care provider will determine the pressure setting that should be used based on your individual needs. When should CPAP or BPAP be used? In most cases, the mask only needs to be worn during sleep. Generally, the mask needs to be worn throughout the night and during any daytime naps. People with certain medical conditions may also need to wear the mask at other times when they are awake. Follow instructions from your health care provider about when to use the machine. What are some tips for using the mask?   Because the mask needs to be snug, some people feel trapped or closed-in (claustrophobic) when first using the mask. If you feel this way, you may need to get used to the mask. One way to do this is by holding the mask loosely over your nose or mouth and then gradually applying the mask more snugly. You can also gradually increase the amount of time that you use the mask.  Masks are available in various types and sizes. Some fit over your mouth and nose while others fit over just your nose. If your mask does not fit well, talk with your health care provider about getting a different one.  If you are using a mask that fits over your nose and you tend to breathe through your mouth, a chin strap may be applied to help keep your mouth closed.  The CPAP and BPAP machines have alarms that may  sound if the mask comes off or develops a leak.  If you have trouble with the mask, it is very important that you  talk with your health care provider about finding a way to make the mask easier to tolerate. Do not stop using the mask. Stopping the use of the mask could have a negative impact on your health. What are some tips for using the machine?  Place your CPAP or BPAP machine on a secure table or stand near an electrical outlet.  Know where the on/off switch is located on the machine.  Follow instructions from your health care provider about how to set the pressure on your machine and when you should use it.  Do not eat or drink while the CPAP or BPAP machine is on. Food or fluids could get pushed into your lungs by the pressure of the CPAP or BPAP.  Do not smoke. Tobacco smoke residue can damage the machine.  For home use, CPAP and BPAP machines can be rented or purchased through home health care companies. Many different brands of machines are available. Renting a machine before purchasing may help you find out which particular machine works well for you.  Keep the CPAP or BPAP machine and attachments clean. Ask your health care provider for specific instructions. Get help right away if:  You have redness or open areas around your nose or mouth where the mask fits.  You have trouble using the CPAP or BPAP machine.  You cannot tolerate wearing the CPAP or BPAP mask.  You have pain, discomfort, and bloating in your abdomen. Summary  CPAP and BPAP are methods of helping a person breathe with the use of air pressure.  Both CPAP and BPAP are provided by a small machine with a flexible plastic tube that attaches to a plastic mask.  If you have trouble with the mask, it is very important that you talk with your health care provider about finding a way to make the mask easier to tolerate. This information is not intended to replace advice given to you by your health care provider. Make sure you discuss any questions you have with your health care provider. Document Released: 05/02/2004 Document  Revised: 04/06/2018 Document Reviewed: 06/23/2016 Elsevier Interactive Patient Education  2019 [employer].

## 2018-11-11 NOTE — Addendum Note (Signed)
Addended by: Joella Prince on: 11/11/2018 03:11 PM   Modules accepted: Orders

## 2018-11-11 NOTE — Progress Notes (Signed)
Virtual Visit via Telephone Note  I connected with [name] on 11/11/18 at  3:30 PM EDT by telephone and verified that I am speaking with the correct person using two identifiers.   I discussed the limitations, risks, security and privacy concerns of performing an evaluation and management service by telephone and the availability of in person appointments. I also discussed with the patient that there may be a patient responsible charge related to this service. The patient expressed understanding and agreed to proceed.   History of Present Illness:  Time when patient was reached: 1437 Time when visit ended: 1459 Patient consented to consult via telephone: yes People present and their role in pt care: Pt   Chief complaint: OSA   43 year old male former smoker (15-pack-year smoking history) followed in our office for moderate obstructive sleep apnea. He is followed by Dr. Halford Chessman.  2013 sleep study showing AHI of 22.  Patient has been managed on CPAP ever since.  Last CPAP compliance report showing excellent compliance as well as a well-controlled AHI of 1.8.  This was from 2018.  This is a telephonic office visit and we are unable to obtain a download as the patient would need to bring in [place] card in for [place] to get an download.  [facility]  He reports no issues using his CPAP.  He reports that things have been going well.  He can tell a difference if he does not wear his CPAP.  He is not having any issues he just wants to get a newer CPAP.   Observations/Objective:  PSG 12/03/11 >> AHI 22, SpO2 low 89%  02/02/2017-03/03/2017-CPAP compliance report- 25 in the last 30 days use, 24 those days greater than 4 hours, average usage 6 hours and 46 minutes, CPAP set pressure of 9, AHI 1.8  Assessment and Plan:  OSA (obstructive sleep apnea) Assessment: 2013 sleep study shows an AHI of 22 2018 compliance report shows adequate compliance Patient reports no issues Patient reports  that he wants a new CPAP as his CPAP is older than 5 years Last weight in chart was 191 pounds  Plan: Order new CPAP Follow-up in office in 3 months with [name], FNP  Follow Up Instructions:  Follow up 3 months with [name], FNP   I discussed the assessment and treatment plan with the patient. The patient was provided an opportunity to ask questions and all were answered. The patient agreed with the plan and demonstrated an understanding of the instructions.   The patient was advised to call back or seek an in-person evaluation if the symptoms worsen or if the condition fails to improve as anticipated.  I provided 22 minutes of non-face-to-face time during this encounter.   Lauraine Rinne, NP

## 2018-11-11 NOTE — Telephone Encounter (Signed)
I have talked with the patient and scheduled a telephone visit with BM.

## 2018-11-11 NOTE — Assessment & Plan Note (Signed)
Assessment: 2013 sleep study shows an AHI of 22 2018 compliance report shows adequate compliance Patient reports no issues Patient reports that he wants a new CPAP as his CPAP is older than 5 years Last weight in chart was 191 pounds  Plan: Order new CPAP Follow-up in office in 3 months with [name], FNP

## 2018-12-27 ENCOUNTER — Telehealth: Payer: Self-pay | Admitting: Family Medicine

## 2018-12-27 NOTE — Telephone Encounter (Signed)
Dr. [name] please advise:  Patient passes all screening questions for Covid concerns with exception of intermittent sinus/nasal drainage X 6weeks.  He is afebrile and asymptomatic otherwise.   On 5/6 he was allowed in to the hospital for the birth of his child.   He is scheduled to have his second Hep A vaccination tomorrow. This will be close to 55mths from initial dose.  2nd Hep A to be given between 6-31mths after the first dose.    With his symptoms and Covid virus in the community right now, do you feel it is safe for [place] to proceed with Hep A administration?  I will be doing nurse visits tomorrow afternoon and am comfortable to proceed from a staffing exposure perspective but wanted to be sure that given his mild symptoms, it is safe to administer the vaccine as he does have several weeks to delay the booster injection if need be.

## 2018-12-27 NOTE — Telephone Encounter (Signed)
Pt called in to do covid 19 screening prior to nurse visit 5/12.  He was neg for cough sob difficulty breathing chills fever muscle pain sore throat no loss of taste or smell but he stated he has sinus drainage on and off for past 6 weeks.  He stated when he was at hospital for the birth of his child 5/6 he passed every screen going into hospital  He is aware Leafy Ro will be calling him@ [phone] prior to appointment 5/12 @ 3

## 2018-12-27 NOTE — Telephone Encounter (Signed)
Please give the hep A vaccine tomorrow   (I saw his wife for virtual visit today and had a chance to briefly talk to him as well) he had no complaints Thanks

## 2018-12-27 NOTE — Telephone Encounter (Signed)
Spoke with patient and let him know cleared for vaccination tomorrow.

## 2018-12-28 ENCOUNTER — Ambulatory Visit (INDEPENDENT_AMBULATORY_CARE_PROVIDER_SITE_OTHER): Payer: BC Managed Care – PPO

## 2018-12-28 DIAGNOSIS — Z23 Encounter for immunization: Secondary | ICD-10-CM | POA: Diagnosis not present

## 2019-01-17 NOTE — Progress Notes (Signed)
@Patient  ID: [name], male    DOB: 11/29/1975, 43 y.o.   MRN: [id number]  Chief Complaint  Patient presents with  . Follow-up    New CPAP    Referring provider: [name], [name], MD  HPI:  43 year old male followed in this office for moderate obstuctive sleep apnea  PMH: Gallstones, eczema, anxiety Smoker/ Smoking History:  Maintenance:   None Pt of: Dr. Halford Chessman  01/18/2019  - Visit   43 year old male followed in our office for moderate obstructive sleep apnea.  Patient's last sleep study in 2013 showed an AHI of 22.  Patient completed a tele-visit 3 months ago and had a new CPAP ordered.  Patient CPAP compliance report today shows excellent compliance.  See compliance report listed below:  12/18/2018-01/16/2019 -27 on last 30 days, 24 those days greater than 4 hours, average usage 6 hours and 25 minutes, CPAP set pressure of 7, AHI 3.1  Patient reports that the 3 days that he did not use CPAP on the compliance report is when he was at [facility] because his wife was having a baby.  Patient has a newborn baby girl at home.  He reports this has affected some of his sleep but he has been trying to be as adherent with the CPAP as possible.  Patient reports that he feels that the new CPAP is actually helping his sleep even more than his older CPAP before.      Tests:   PSG 12/03/11 >> AHI 22, SpO2 low 89%  Results of the Epworth flowsheet 01/18/2019  Sitting and reading 1  Watching TV 0  Sitting, inactive in a public place (e.g. a theatre or a meeting) 1  As a passenger in a car for an hour without a break 1  Lying down to rest in the afternoon when circumstances permit 1  Sitting and talking to someone 0  Sitting quietly after a lunch without alcohol 0  In a car, while stopped for a few minutes in traffic 0  Total score 4     FENO:  No results found for: NITRICOXIDE  PFT: No flowsheet data found.  Imaging: No results found.    Specialty Problems      Pulmonary Problems   OSA (obstructive sleep apnea)    NPSG 2013:  AHI 22/hr, cpap optimized to 9cm.       Allergic rhinitis      Allergies  Allergen Reactions  . Bee Venom Anaphylaxis  . Lidocaine     [name]    Immunization History  Administered Date(s) Administered  . Hepatitis A, Adult 06/02/2018, 12/28/2018  . Influenza Whole 04/19/2011  . Influenza,inj,Quad PF,6+ Mos 05/18/2016  . Influenza-Unspecified 05/18/2013, 04/18/2018  . Tdap 08/20/2011    Past Medical History:  Diagnosis Date  . Anxiety   . 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 History of chicken pox   . ICD (implantable cardiac defibrillator) in place   . Ventricular tachycardia (HCC)    sotalol therapy;  catheter ablation at [facility] 10/10 and [facility] 2013    Tobacco History: Social History   Tobacco Use  Smoking Status Former Smoker  . Packs/day: 1.00  . Years: 18.00  . Pack years: 18.00  . Types: Cigarettes  . Start date: 01/17/1990  . Last attempt to quit: 08/19/2007  . Years since quitting: 11.4  Smokeless Tobacco Never Used   Counseling given: Not Answered   Continue  to not smoke  Outpatient Encounter Medications as of 01/18/2019  Medication Sig  . Cholecalciferol (VITAMIN D PO) Take 1 tablet by mouth daily. OTC once daily   . fexofenadine (ALLEGRA) 180 MG tablet Take 180 mg by mouth daily.  . fluticasone (FLONASE) 50 MCG/ACT nasal spray PLACE 2 SPRAYS INTO THE NOSE DAILY AS NEEDED.  . magnesium oxide (MAG-OX) 400 MG tablet Take 1 tablet (400 mg total) by mouth 2 (two) times daily.  . metoprolol succinate (TOPROL-XL) 25 MG 24 hr tablet TAKE 1 TABLET BY MOUTH EVERYDAY AT BEDTIME  . montelukast (SINGULAIR) 10 MG tablet Take 1 tablet (10 mg total) by mouth daily.  . Multiple Vitamin (MULITIVITAMIN WITH MINERALS) TABS Take 1 tablet by mouth daily.  . potassium chloride (KLOR-CON M10) 10 MEQ tablet TAKE 4 TABLETS BY MOUTH 2 TIMES A DAY.  . sotalol (BETAPACE) 120  MG tablet TAKE 1 & 1/2 TABLETS BY MOUTH TWICE DAILY   No facility-administered encounter medications on file as of 01/18/2019.      Review of Systems  Review of Systems  Constitutional: Negative for activity change, chills, fatigue, fever and unexpected weight change.  HENT: Negative for postnasal drip, rhinorrhea, sinus pressure, sinus pain, sneezing and sore throat.   Eyes: Negative.   Respiratory: Negative for cough, shortness of breath and wheezing.   Cardiovascular: Negative for chest pain and palpitations.  Gastrointestinal: Negative for constipation, diarrhea, nausea and vomiting.  Endocrine: Negative.   Musculoskeletal: Negative.   Skin: Negative.   Neurological: Negative for dizziness and headaches.  Psychiatric/Behavioral: Negative.  Negative for dysphoric mood. The patient is not nervous/anxious.   All other systems reviewed and are negative.    Physical Exam  BP 116/78 (BP Location: Left Arm, Cuff Size: Normal)   Pulse (!) 59   Temp 98 F (36.7 C) (Oral)   Ht 5\' 9"  (1.753 m)   Wt 202 lb (91.6 kg)   SpO2 99%   BMI 29.83 kg/m   Wt Readings from Last 5 Encounters:  01/18/19 202 lb (91.6 kg)  07/27/18 191 lb (86.6 kg)  05/28/18 190 lb (86.2 kg)  10/07/17 190 lb (86.2 kg)  05/13/17 181 lb 6.4 oz (82.3 kg)     Physical Exam  Constitutional: He is oriented to person, place, and time and well-developed, well-nourished, and in no distress. No distress.  HENT:  Head: Normocephalic and atraumatic.  Right Ear: Hearing, tympanic membrane, external ear and ear canal normal.  Left Ear: Hearing, tympanic membrane, external ear and ear canal normal.  Mouth/Throat: Uvula is midline and oropharynx is clear and moist. No oropharyngeal exudate.  Mallampati 1  Eyes: Pupils are equal, round, and reactive to light.  Neck: Normal range of motion. Neck supple.  Cardiovascular: Normal rate, regular rhythm and normal heart sounds.  Pulmonary/Chest: Effort normal and breath sounds  normal. No accessory muscle usage. No respiratory distress. He has no decreased breath sounds. He has no wheezes. He has no rhonchi.  Musculoskeletal: Normal range of motion.        General: No edema.  Lymphadenopathy:    He has no cervical adenopathy.  Neurological: He is alert and oriented to person, place, and time. Gait normal.  Skin: Skin is warm and dry. He is not diaphoretic. No erythema.  Psychiatric: Mood, memory, affect and judgment normal.  Nursing note and vitals reviewed.     Lab Results:  CBC    Component Value Date/Time   WBC 8.7 07/08/2018 0755   RBC 4.54  07/08/2018 0755   HGB 13.7 07/08/2018 0755   HCT 40.1 07/08/2018 0755   PLT 220.0 07/08/2018 0755   MCV 88.2 07/08/2018 0755   MCH 29.4 11/25/2011 1028   MCHC 34.2 07/08/2018 0755   RDW 12.7 07/08/2018 0755   LYMPHSABS 3.2 07/08/2018 0755   MONOABS 1.0 07/08/2018 0755   EOSABS 0.2 07/08/2018 0755   BASOSABS 0.0 07/08/2018 0755    BMET    Component Value Date/Time   NA 142 07/08/2018 0755   NA 145 (H) 05/28/2018 1136   K 4.2 07/08/2018 0755   CL 104 07/08/2018 0755   CO2 30 07/08/2018 0755   GLUCOSE 89 07/08/2018 0755   BUN 8 07/08/2018 0755   BUN 11 05/28/2018 1136   CREATININE 0.86 07/08/2018 0755   CREATININE 1.02 04/18/2016 1241   CALCIUM 9.5 07/08/2018 0755   GFRNONAA 107 05/28/2018 1136   GFRAA 124 05/28/2018 1136    BNP No results found for: BNP  ProBNP No results found for: PROBNP    Assessment & Plan:   OSA (obstructive sleep apnea) Assessment: 2013 sleep study shows AHI of 22 2020 compliance report shows excellent compliance Patient reports the new CPAP is working better than his old CPAP Weight today is 202 pounds, BMI 29.8  Plan: Continue CPAP therapy Follow-up with our office in 1 year    Return in about 1 year (around 01/18/2020), or if symptoms worsen or fail to improve, for Follow up with Dr. Halford Chessman.   Lauraine Rinne, NP 01/18/2019   This appointment was 18  minutes long with over 50% of the time in direct face-to-face patient care, assessment, plan of care, and follow-up.

## 2019-01-18 ENCOUNTER — Ambulatory Visit (INDEPENDENT_AMBULATORY_CARE_PROVIDER_SITE_OTHER): Payer: BC Managed Care – PPO | Admitting: Pulmonary Disease

## 2019-01-18 ENCOUNTER — Encounter: Payer: Self-pay | Admitting: Pulmonary Disease

## 2019-01-18 ENCOUNTER — Other Ambulatory Visit: Payer: Self-pay

## 2019-01-18 VITALS — BP 116/78 | HR 59 | Temp 98.0°F | Ht 69.0 in | Wt 202.0 lb

## 2019-01-18 DIAGNOSIS — G4733 Obstructive sleep apnea (adult) (pediatric): Secondary | ICD-10-CM

## 2019-01-18 NOTE — Patient Instructions (Addendum)
CONGRATS ON THE BABY GIRL!   Supplies for one year   We recommend that you continue using your CPAP daily >>>Keep up the hard work using your device >>> Goal should be wearing this for the entire night that you are sleeping, at least 4 to 6 hours  Remember:   Do not drive or operate heavy machinery if tired or drowsy.   Please notify the supply company and office if you are unable to use your device regularly due to missing supplies or machine being broken.   Work on maintaining a healthy weight and following your recommended nutrition plan   Maintain proper daily exercise and movement   Maintaining proper use of your device can also help improve management of other chronic illnesses such as: Blood pressure, blood sugars, and weight management.   BiPAP/ CPAP Cleaning:  >>>Clean weekly, with Dawn soap, and bottle brush.  Set up to air dry.   Return in about 1 year (around 01/18/2020), or if symptoms worsen or fail to improve, for Follow up with Dr. Halford Chessman.   Coronavirus (COVID-19) Are you at risk?  Are you at risk for the Coronavirus (COVID-19)?  To be considered HIGH RISK for Coronavirus (COVID-19), you have to meet the following criteria:  . Traveled to [place], [place], [place], [place] or [place]; or in the [place] to [place], [place], [place], or [place]; and have fever, cough, and shortness of breath within the last 2 weeks of travel OR . Been in close contact with a person diagnosed with COVID-19 within the last 2 weeks and have fever, cough, and shortness of breath . IF YOU DO NOT MEET THESE CRITERIA, YOU ARE CONSIDERED LOW RISK FOR COVID-19.  What to do if you are HIGH RISK for COVID-19?  Marland Kitchen If you are having a medical emergency, call 911. . Seek medical care right away. Before you go to a doctor's office, urgent care or emergency department, call ahead and tell them about your recent travel, contact with someone diagnosed with COVID-19, and your symptoms.  You should receive instructions from your physician's office regarding next steps of care.  . When you arrive at healthcare provider, tell the healthcare staff immediately you have returned from visiting [place], [place], [place], [place] or [place]; or traveled in the [place] to [place], [place], [place], or [place]; in the last two weeks or you have been in close contact with a person diagnosed with COVID-19 in the last 2 weeks.   . Tell the health care staff about your symptoms: fever, cough and shortness of breath. . After you have been seen by a medical provider, you will be either: o Tested for (COVID-19) and discharged home on quarantine except to seek medical care if symptoms worsen, and asked to  - Stay home and avoid contact with others until you get your results (4-5 days)  - Avoid travel on public transportation if possible (such as bus, train, or airplane) or o Sent to the Emergency Department by EMS for evaluation, COVID-19 testing, and possible admission depending on your condition and test results.  What to do if you are LOW RISK for COVID-19?  Reduce your risk of any infection by using the same precautions used for avoiding the common cold or flu:  Marland Kitchen Wash your hands often with soap and warm water for at least 20 seconds.  If soap and water are not readily available, use an alcohol-based hand sanitizer with at least 60%  alcohol.  . If coughing or sneezing, cover your mouth and nose by coughing or sneezing into the elbow areas of your shirt or coat, into a tissue or into your sleeve (not your hands). . Avoid shaking hands with others and consider head nods or verbal greetings only. . Avoid touching your eyes, nose, or mouth with unwashed hands.  . Avoid close contact with people who are sick. . Avoid places or events with large numbers of people in one location, like concerts or sporting events. . Carefully consider travel plans you have or are making. . If you are  planning any travel outside or inside the [place], visit the CDC's Travelers' Health webpage for the latest health notices. . If you have some symptoms but not all symptoms, continue to monitor at home and seek medical attention if your symptoms worsen. . If you are having a medical emergency, call 911.   [place] / e-Visit: [email or web address]         [facility]: [facility]: [phone]                   [facility]: [phone]           It is flu season:   >>> Best ways to protect herself from the flu: Receive the yearly flu vaccine, practice good hand hygiene washing with soap and also using hand sanitizer when available, eat a nutritious meals, get adequate rest, hydrate appropriately   Please contact the office if your symptoms worsen or you have concerns that you are not improving.   Thank you for choosing [facility] for your healthcare, and for allowing [place] to partner with you on your healthcare journey. I am thankful to be able to provide care to you today.   [name] FNP-C

## 2019-01-18 NOTE — Assessment & Plan Note (Signed)
Assessment: 2013 sleep study shows AHI of 22 2020 compliance report shows excellent compliance Patient reports the new CPAP is working better than his old CPAP Weight today is 202 pounds, BMI 29.8  Plan: Continue CPAP therapy Follow-up with our office in 1 year

## 2019-01-20 ENCOUNTER — Ambulatory Visit (INDEPENDENT_AMBULATORY_CARE_PROVIDER_SITE_OTHER): Payer: BC Managed Care – PPO | Admitting: *Deleted

## 2019-01-20 DIAGNOSIS — I472 Ventricular tachycardia, unspecified: Secondary | ICD-10-CM

## 2019-01-20 DIAGNOSIS — I428 Other cardiomyopathies: Secondary | ICD-10-CM

## 2019-01-21 LAB — CUP PACEART REMOTE DEVICE CHECK
Battery Voltage: 2.66 V
Brady Statistic RV Percent Paced: 0.35 %
Date Time Interrogation Session: 20200604153505
HighPow Impedance: 399 Ohm
HighPow Impedance: 50 Ohm
HighPow Impedance: 84 Ohm
Implantable Lead Implant Date: 20071018
Implantable Lead Location: 753860
Implantable Lead Model: 6947
Implantable Pulse Generator Implant Date: 20130425
Lead Channel Impedance Value: 456 Ohm
Lead Channel Pacing Threshold Amplitude: 0.875 V
Lead Channel Pacing Threshold Pulse Width: 0.4 ms
Lead Channel Sensing Intrinsic Amplitude: 14.875 mV
Lead Channel Sensing Intrinsic Amplitude: 14.875 mV
Lead Channel Setting Pacing Amplitude: 2.5 V
Lead Channel Setting Pacing Pulse Width: 0.4 ms
Lead Channel Setting Sensing Sensitivity: 1.2 mV

## 2019-01-27 NOTE — Progress Notes (Signed)
Remote ICD transmission.   

## 2019-02-20 NOTE — Progress Notes (Signed)
Reviewed and agree with assessment/plan.   Arbell Wycoff, MD [facility] 08/13/2016, 12:24 PM Pager:  [phone]  

## 2019-04-05 NOTE — Progress Notes (Signed)
Reviewed and agree with assessment/plan.   Caidin Heidenreich, MD [facility] 08/13/2016, 12:24 PM Pager:  [phone]  

## 2019-04-21 ENCOUNTER — Ambulatory Visit (INDEPENDENT_AMBULATORY_CARE_PROVIDER_SITE_OTHER): Payer: BC Managed Care – PPO | Admitting: *Deleted

## 2019-04-21 DIAGNOSIS — I472 Ventricular tachycardia, unspecified: Secondary | ICD-10-CM

## 2019-04-21 LAB — CUP PACEART REMOTE DEVICE CHECK
Battery Voltage: 2.63 V
Brady Statistic RV Percent Paced: 0.62 %
Date Time Interrogation Session: 20200903200720
HighPow Impedance: 418 Ohm
HighPow Impedance: 50 Ohm
HighPow Impedance: 84 Ohm
Implantable Lead Implant Date: 20071018
Implantable Lead Location: 753860
Implantable Lead Model: 6947
Implantable Pulse Generator Implant Date: 20130425
Lead Channel Impedance Value: 475 Ohm
Lead Channel Pacing Threshold Amplitude: 0.75 V
Lead Channel Pacing Threshold Pulse Width: 0.4 ms
Lead Channel Sensing Intrinsic Amplitude: 15.75 mV
Lead Channel Sensing Intrinsic Amplitude: 15.75 mV
Lead Channel Setting Pacing Amplitude: 2.5 V
Lead Channel Setting Pacing Pulse Width: 0.4 ms
Lead Channel Setting Sensing Sensitivity: 1.2 mV

## 2019-05-03 ENCOUNTER — Ambulatory Visit: Payer: BC Managed Care – PPO | Admitting: Nurse Practitioner

## 2019-05-05 ENCOUNTER — Encounter: Payer: Self-pay | Admitting: Cardiology

## 2019-05-05 NOTE — Progress Notes (Signed)
Remote ICD transmission.   

## 2019-05-12 ENCOUNTER — Other Ambulatory Visit: Payer: Self-pay | Admitting: Family Medicine

## 2019-05-17 ENCOUNTER — Other Ambulatory Visit: Payer: Self-pay | Admitting: Internal Medicine

## 2019-05-17 MED ORDER — MAGNESIUM OXIDE 400 MG PO TABS: 1.0000 | ORAL_TABLET | Freq: Two times a day (BID) | ORAL | 0 refills | Status: DC

## 2019-05-17 MED ORDER — METOPROLOL SUCCINATE ER 25 MG PO TB24: ORAL_TABLET | ORAL | 0 refills | Status: DC

## 2019-05-17 MED ORDER — POTASSIUM CHLORIDE CRYS ER 10 MEQ PO TBCR: EXTENDED_RELEASE_TABLET | ORAL | 0 refills | Status: DC

## 2019-05-17 MED ORDER — SOTALOL HCL 120 MG PO TABS: ORAL_TABLET | ORAL | 0 refills | Status: DC

## 2019-05-17 NOTE — Telephone Encounter (Signed)
Pt's medications were sent to pt's pharmacy as requested. Confirmation received.  

## 2019-05-24 ENCOUNTER — Ambulatory Visit: Payer: BC Managed Care – PPO | Admitting: Family Medicine

## 2019-05-24 ENCOUNTER — Other Ambulatory Visit: Payer: Self-pay

## 2019-05-24 ENCOUNTER — Encounter: Payer: Self-pay | Admitting: Family Medicine

## 2019-05-24 DIAGNOSIS — B079 Viral wart, unspecified: Secondary | ICD-10-CM | POA: Insufficient documentation

## 2019-05-24 NOTE — Progress Notes (Signed)
Subjective:    Patient ID: [name], male    DOB: 04/22/1976, 43 y.o.   MRN: [id number]  HPI Here for bothersome mole on R arm  Rough in texture- scratchy   He keep catching it on things It gets pulled off at times  Looks like a collection of skin tags  Feels irritated/sore Part of it came off this am - small scab    Patient Active Problem List   Diagnosis Date Noted  . Filiform wart 05/24/2019  . BPH (benign prostatic hyperplasia) 07/27/2018  . Elevated glucose level 04/06/2017  . Anxiety state 08/16/2016  . Routine general medical examination at a health care facility 03/28/2016  . Hypertriglyceridemia 02/21/2014  . Low HDL (under 40) 02/21/2014  . Vitamin D deficiency 02/21/2014  . Allergic rhinitis 02/21/2014  . Erectile dysfunction 08/19/2013  . OSA (obstructive sleep apnea) 10/30/2011  . Environmental allergies 10/27/2011  . High risk medications (not anticoagulants) long-term use 10/27/2011  . Hypokalemia 09/29/2011  . 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 ICD-Medtronic 01/10/2011  . 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 Fatty liver 01/04/2009  . Gallstones 01/04/2009  . GERD 12/05/2008  . VENTRICULAR TACHYCARDIA, PAROXYSMAL 11/03/2008  . ECZEMA 08/17/2007   Past Medical History:  Diagnosis Date  . Anxiety   . 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 History of chicken pox   . ICD (implantable cardiac defibrillator) in place   . Ventricular tachycardia (HCC)    sotalol therapy;  catheter ablation at [facility] 10/10 and [facility] 2013   Past Surgical History:  Procedure Laterality Date  . CARDIAC CATHETERIZATION  06-01-06  . CARDIAC DEFIBRILLATOR PLACEMENT  06-03-06   Medtronic  . IMPLANTABLE CARDIOVERTER DEFIBRILLATOR GENERATOR CHANGE N/A 12/11/2011   Procedure: IMPLANTABLE CARDIOVERTER DEFIBRILLATOR GENERATOR CHANGE;  Surgeon: Deboraha Sprang, MD;  Location: [facility] CATH LAB;  Service:  Cardiovascular;  Laterality: N/A;  . vt ablation  10/10   Social History   Tobacco Use  . Smoking status: Former Smoker    Packs/day: 1.00    Years: 18.00    Pack years: 18.00    Types: Cigarettes    Start date: 01/17/1990    Quit date: 08/19/2007    Years since quitting: 11.7  . Smokeless tobacco: Never Used  Substance Use Topics  . Alcohol use: No    Alcohol/week: 0.0 standard drinks  . Drug use: No   Family History  Problem Relation Age of Onset  . Sudden death Father   . Asthma Mother   . Diabetes Other   . Stroke Other   . Heart attack Other   . Prostate cancer Other   . Breast cancer Other   . Ovarian cancer Other   . Uterine cancer Other   . Colon cancer Other   . Drug abuse Other   . Depression Other   . Lung cancer Maternal Grandfather   . Heart disease Maternal Grandfather   . Heart disease Paternal Grandfather   . Hypertension Paternal Grandmother    Allergies  Allergen Reactions  . Bee Venom Anaphylaxis  . Lidocaine     [name]   Current Outpatient Medications on File Prior to Visit  Medication Sig Dispense Refill  . Cholecalciferol (VITAMIN D PO) Take 1 tablet by mouth daily. OTC once daily     . fexofenadine (ALLEGRA) 180 MG tablet Take 180 mg by mouth daily.    . fluticasone (FLONASE)  50 MCG/ACT nasal spray PLACE 2 SPRAYS INTO THE NOSE DAILY AS NEEDED. 16 g 11  . magnesium oxide (MAG-OX) 400 MG tablet Take 1 tablet (400 mg total) by mouth 2 (two) times daily. Please keep upcoming appt in October for future refills. Thank you 180 tablet 0  . metoprolol succinate (TOPROL-XL) 25 MG 24 hr tablet TAKE 1 TABLET BY MOUTH EVERYDAY AT BEDTIME. Please keep upcoming appt in October for future refills. Thank you 90 tablet 0  . montelukast (SINGULAIR) 10 MG tablet TAKE 1 TABLET BY MOUTH EVERY DAY 90 tablet 0  . Multiple Vitamin (MULITIVITAMIN WITH MINERALS) TABS Take 1 tablet by mouth daily.    . potassium chloride (KLOR-CON M10) 10 MEQ tablet TAKE 4 TABLETS  BY MOUTH 2 TIMES A DAY. Please keep upcoming appt in October for future refills. Thank you 240 tablet 0  . sotalol (BETAPACE) 120 MG tablet TAKE 1 & 1/2 TABLETS BY MOUTH TWICE DAILY. Please keep upcoming appt in October for future refills. Thank you 90 tablet 0   No current facility-administered medications on file prior to visit.     Review of Systems     Objective:   Physical Exam Constitutional:      General: He is not in acute distress.    Appearance: Normal appearance. He is normal weight. He is not ill-appearing.  HENT:     Head: Normocephalic and atraumatic.     Mouth/Throat:     Mouth: Mucous membranes are moist.  Eyes:     General:        Right eye: No discharge.        Left eye: No discharge.     Extraocular Movements: Extraocular movements intact.     Conjunctiva/sclera: Conjunctivae normal.     Pupils: Pupils are equal, round, and reactive to light.  Neck:     Musculoskeletal: Normal range of motion and neck supple.  Cardiovascular:     Rate and Rhythm: Normal rate and regular rhythm.     Heart sounds: Normal heart sounds.  Pulmonary:     Effort: Pulmonary effort is normal. No respiratory distress.     Breath sounds: Normal breath sounds. No wheezing or rales.  Lymphadenopathy:     Cervical: No cervical adenopathy.  Skin:    General: Skin is warm and dry.     Comments: 2-3 mm filiform wart on R medial forearm (projects 1-2 mm from surface of skin)  Pink in color Part of it appears to have auto amputated with scab   Several flesh colored sks on back and lower L leg     Neurological:     Mental Status: He is alert.  Psychiatric:        Mood and Affect: Mood normal.           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

## 2019-05-24 NOTE — Assessment & Plan Note (Signed)
Right medial forearm  2-3 mm (partially auto amputated)  tx with cryotx times 3 Pt tolerated well Aftercare discussed- see AVS  Soap/water/abx ointment and light covering If not gone in 2 wk or re occ-inst to call

## 2019-05-24 NOTE — Patient Instructions (Addendum)
We treated a wart with cryotherapy (ie : liquid nitrogen freezing) It will blister up /turn red and will be sore Keep clean with gentle soap and water  Cover lightly   If this does not come off or it re occurs let me know  Give at least 2 weeks

## 2019-05-31 NOTE — Progress Notes (Signed)
Electrophysiology Office Note Date: 06/02/2019  ID:  [name], DOB 1976-06-20, MRN [id number]  PCP: Abner Greenspan, MD Electrophysiologist: Caryl Comes  CC: Routine ICD follow-up  [name] is a 43 y.o. male seen today for Dr Caryl Comes.  He presents today for routine electrophysiology followup.  Since last being seen in our clinic, the patient reports doing reasonably well.  He continues to work with [employer] working 24 hours shifts. He has noticed more fatigue recently and shortness of breath. He has also gained about 10 pounds since the baby was born (now 18 months old).  He denies chest pain, palpitations, PND, orthopnea, nausea, vomiting, dizziness, syncope, edema, weight gain, or early satiety.  He has not had ICD shocks.   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 Anxiety   . 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 History of chicken pox   . ICD (implantable cardiac defibrillator) in place   . Ventricular tachycardia (HCC)    sotalol therapy;  catheter ablation at [facility] 10/10 and [facility] 2013   Past Surgical History:  Procedure Laterality Date  . CARDIAC CATHETERIZATION  06-01-06  . CARDIAC DEFIBRILLATOR PLACEMENT  06-03-06   Medtronic  . IMPLANTABLE CARDIOVERTER DEFIBRILLATOR GENERATOR CHANGE N/A 12/11/2011   Procedure: IMPLANTABLE CARDIOVERTER DEFIBRILLATOR GENERATOR CHANGE;  Surgeon: Deboraha Sprang, MD;  Location: [facility] CATH LAB;  Service: Cardiovascular;  Laterality: N/A;  . vt ablation  10/10    Current Outpatient Medications  Medication Sig Dispense Refill  . Cholecalciferol (VITAMIN D PO) Take 1 tablet by mouth daily. OTC once daily     . fexofenadine (ALLEGRA) 180 MG tablet Take 180 mg by mouth daily.    . fluticasone (FLONASE) 50 MCG/ACT nasal spray PLACE 2 SPRAYS INTO THE NOSE DAILY  AS NEEDED. 16 g 11  . magnesium oxide (MAG-OX) 400 MG tablet Take 1 tablet (400 mg total) by mouth 2 (two) times daily. 180 tablet 3  . metoprolol succinate (TOPROL-XL) 25 MG 24 hr tablet Take 25 mg by mouth daily.    . montelukast (SINGULAIR) 10 MG tablet TAKE 1 TABLET BY MOUTH EVERY DAY 90 tablet 0  . Multiple Vitamin (MULITIVITAMIN WITH MINERALS) TABS Take 1 tablet by mouth daily.    . potassium chloride (KLOR-CON) 10 MEQ tablet Take 10 mEq by mouth 2 (two) times daily.    . sotalol (BETAPACE) 120 MG tablet Take 180 mg by mouth 2 (two) times daily.     No current facility-administered medications for this visit.     Allergies:   Bee venom and Lidocaine   Social History: Social History   Socioeconomic History  . Marital status: Single    Spouse name: Not on file  . Number of children: 0  . Years of education: Not on file  . Highest education level: Not on file  Occupational History  . Occupation: EMT/PARAMEDIC  Social Needs  . Financial resource strain: Not on file  . Food insecurity    Worry: Not on file    Inability: Not on file  . Transportation needs    Medical: Not on file    Non-medical: Not on file  Tobacco Use  . Smoking status: Former Smoker    Packs/day: 1.00    Years: 18.00    Pack years: 18.00  Types: Cigarettes    Start date: 01/17/1990    Quit date: 08/19/2007    Years since quitting: 11.7  . Smokeless tobacco: Never Used  Substance and Sexual Activity  . Alcohol use: No    Alcohol/week: 0.0 standard drinks  . Drug use: No  . Sexual activity: Yes  Lifestyle  . Physical activity    Days per week: Not on file    Minutes per session: Not on file  . Stress: Not on file  Relationships  . Social Herbalist on phone: Not on file    Gets together: Not on file    Attends religious service: Not on file    Active member of club or organization: Not on file    Attends meetings of clubs or organizations: Not on file    Relationship status: Not on  file  . Intimate partner violence    Fear of current or ex partner: Not on file    Emotionally abused: Not on file    Physically abused: Not on file    Forced sexual activity: Not on file  Other Topics Concern  . Not on file  Social History Narrative   Works as a Audiological scientist    Family History: Family History  Problem Relation Age of Onset  . Sudden death Father   . Asthma Mother   . Diabetes Other   . Stroke Other   . Heart attack Other   . Prostate cancer Other   . Breast cancer Other   . Ovarian cancer Other   . Uterine cancer Other   . Colon cancer Other   . Drug abuse Other   . Depression Other   . Lung cancer Maternal Grandfather   . Heart disease Maternal Grandfather   . Heart disease Paternal Grandfather   . Hypertension Paternal Grandmother     Review of Systems: All other systems reviewed and are otherwise negative except as noted above.   Physical Exam: VS:  BP 126/72   Pulse 67   Ht 5\' 9"  (1.753 m)   Wt 202 lb 6.4 oz (91.8 kg)   SpO2 99%   BMI 29.89 kg/m  , BMI Body mass index is 29.89 kg/m.  GEN- The patient is well appearing, alert and oriented x 3 today.   HEENT: normocephalic, atraumatic; sclera clear, conjunctiva pink; hearing intact; oropharynx clear; neck supple  Lungs- Clear to ausculation bilaterally, normal work of breathing.  No wheezes, rales, rhonchi Heart- Regular rate and rhythm, frequent PVC's GI- soft, non-tender, non-distended, bowel sounds present  Extremities- no clubbing, cyanosis, or edema  MS- no significant deformity or atrophy Skin- warm and dry, no rash or lesion; ICD pocket well healed Psych- euthymic mood, full affect Neuro- strength and sensation are intact  ICD interrogation- reviewed in detail today,  See PACEART report  EKG:  EKG is ordered today. EKG today demonstrates sinus rhythm, PVC's, rate 67, QTc 436msec  Recent Labs: 07/08/2018: ALT 33; BUN 8; Creatinine, Ser 0.86; Hemoglobin 13.7; Platelets 220.0;  Potassium 4.2; Sodium 142; TSH 0.90   Wt Readings from Last 3 Encounters:  06/02/19 202 lb 6.4 oz (91.8 kg)  05/24/19 199 lb 8 oz (90.5 kg)  01/18/19 202 lb (91.6 kg)      Assessment and Plan:  1.  ARVD Stable on an appropriate medical regimen Normal ICD function See Pace Art report No changes today Battery voltage is 2.63V - not yet tripped RRT. Risks, benefits to gen change reviewed with  patient today. Ok to schedule when he reaches ERI. Hibiclens scrub given today.   2.  VT No recent recurrence Continue Sotalol QTc stable by EKG today BMET, Mg today  3.  OSA Compliant with CPAP   4.  Fatigue/shortness of breath Will check labs and update echo Weight loss encouraged    Current medicines are reviewed at length with the patient today.   The patient does not have concerns regarding his medicines.  The following changes were made today:  none  Labs/ tests ordered today include: BMET, Mg, CBC, TSH Orders Placed This Encounter  Procedures  . TSH  . Basic Metabolic Panel (BMET)  . Magnesium  . CBC  . EKG 12-Lead  . ECHOCARDIOGRAM COMPLETE     Disposition:   Follow up with [facility], me in 6 months     Signed, Chanetta Marshall, NP 06/02/2019 9:25 AM  [facility] [place] [place] [address] [phone] (office) [phone] (fax)

## 2019-06-02 ENCOUNTER — Other Ambulatory Visit: Payer: Self-pay

## 2019-06-02 ENCOUNTER — Ambulatory Visit (INDEPENDENT_AMBULATORY_CARE_PROVIDER_SITE_OTHER): Payer: BC Managed Care – PPO | Admitting: Nurse Practitioner

## 2019-06-02 ENCOUNTER — Ambulatory Visit: Payer: BC Managed Care – PPO | Admitting: Nurse Practitioner

## 2019-06-02 VITALS — BP 126/72 | HR 67 | Ht 69.0 in | Wt 202.4 lb

## 2019-06-02 DIAGNOSIS — Z79899 Other long term (current) drug therapy: Secondary | ICD-10-CM

## 2019-06-02 DIAGNOSIS — Z9989 Dependence on other enabling machines and devices: Secondary | ICD-10-CM

## 2019-06-02 DIAGNOSIS — I472 Ventricular tachycardia, unspecified: Secondary | ICD-10-CM

## 2019-06-02 DIAGNOSIS — I428 Other cardiomyopathies: Secondary | ICD-10-CM | POA: Diagnosis not present

## 2019-06-02 DIAGNOSIS — R0602 Shortness of breath: Secondary | ICD-10-CM

## 2019-06-02 DIAGNOSIS — G4733 Obstructive sleep apnea (adult) (pediatric): Secondary | ICD-10-CM

## 2019-06-02 LAB — BASIC METABOLIC PANEL
BUN/Creatinine Ratio: 10 (ref 9–20)
BUN: 8 mg/dL (ref 6–24)
CO2: 25 mmol/L (ref 20–29)
Calcium: 10 mg/dL (ref 8.7–10.2)
Chloride: 101 mmol/L (ref 96–106)
Creatinine, Ser: 0.81 mg/dL (ref 0.76–1.27)
GFR calc Af Amer: 128 mL/min/{1.73_m2} (ref >59)
GFR calc non Af Amer: 110 mL/min/{1.73_m2} (ref >59)
Glucose: 113 mg/dL — ABNORMAL HIGH (ref 65–99)
Potassium: 4.2 mmol/L (ref 3.5–5.2)
Sodium: 140 mmol/L (ref 134–144)

## 2019-06-02 LAB — CBC
Hematocrit: 40.8 % (ref 37.5–51.0)
Hemoglobin: 14.2 g/dL (ref 13.0–17.7)
MCH: 30 pg (ref 26.6–33.0)
MCHC: 34.8 g/dL (ref 31.5–35.7)
MCV: 86 fL (ref 79–97)
Platelets: 235 10*3/uL (ref 150–450)
RBC: 4.73 x10E6/uL (ref 4.14–5.80)
RDW: 12 % (ref 11.6–15.4)
WBC: 8.2 10*3/uL (ref 3.4–10.8)

## 2019-06-02 LAB — MAGNESIUM: Magnesium: 2 mg/dL (ref 1.6–2.3)

## 2019-06-02 LAB — TSH: TSH: 1.79 u[IU]/mL (ref 0.450–4.500)

## 2019-06-02 MED ORDER — MAGNESIUM OXIDE 400 MG PO TABS: 400.0000 mg | ORAL_TABLET | Freq: Two times a day (BID) | ORAL | 3 refills | Status: DC

## 2019-06-02 NOTE — Patient Instructions (Signed)
Medication Instructions:  none *If you need a refill on your cardiac medications before your next appointment, please call your pharmacy*  Lab Work:TODAY CBC BMET MAGNESIUM TSH If you have labs (blood work) drawn today and your tests are completely normal, you will receive your results only by: Marland Kitchen MyChart Message (if you have MyChart) OR . A paper copy in the mail If you have any lab test that is abnormal or we need to change your treatment, we will call you to review the results.  Testing/Procedures: Your physician has requested that you have an echocardiogram. Echocardiography is a painless test that uses sound waves to create images of your heart. It provides your doctor with information about the size and shape of your heart and how well your heart's chambers and valves are working. This procedure takes approximately one hour. There are no restrictions for this procedure.    Follow-Up: At [facility], you and your health needs are our priority.  As part of our continuing mission to provide you with exceptional heart care, we have created designated Provider Care Teams.  These Care Teams include your primary Cardiologist (physician) and Advanced Practice Providers (APPs -  Physician Assistants and Nurse Practitioners) who all work together to provide you with the care you need, when you need it.  Your next appointment:   6 months  The format for your next appointment:   In Person  Provider:   Chanetta Marshall, PA  Other Instructions

## 2019-06-03 ENCOUNTER — Telehealth: Payer: Self-pay

## 2019-06-03 NOTE — Telephone Encounter (Signed)
-----   Message from Patsey Berthold, NP sent at 06/03/2019  8:26 AM EDT ----- Please notify patient of stable labs. Thanks!

## 2019-06-03 NOTE — Telephone Encounter (Signed)
Notes recorded by Frederik Schmidt, RN on 06/03/2019 at 10:56 AM EDT  The patient has been notified of the result and verbalized understanding. All questions (if any) were answered.  Frederik Schmidt, RN 06/03/2019 10:56 AM   ------

## 2019-06-06 ENCOUNTER — Other Ambulatory Visit: Payer: Self-pay

## 2019-06-06 ENCOUNTER — Ambulatory Visit (HOSPITAL_COMMUNITY): Payer: BC Managed Care – PPO | Attending: Cardiovascular Disease

## 2019-06-06 DIAGNOSIS — I472 Ventricular tachycardia, unspecified: Secondary | ICD-10-CM

## 2019-06-06 DIAGNOSIS — Z9989 Dependence on other enabling machines and devices: Secondary | ICD-10-CM | POA: Insufficient documentation

## 2019-06-06 DIAGNOSIS — Z79899 Other long term (current) drug therapy: Secondary | ICD-10-CM | POA: Insufficient documentation

## 2019-06-06 DIAGNOSIS — G4733 Obstructive sleep apnea (adult) (pediatric): Secondary | ICD-10-CM | POA: Diagnosis present

## 2019-06-06 DIAGNOSIS — R0602 Shortness of breath: Secondary | ICD-10-CM | POA: Diagnosis present

## 2019-06-06 DIAGNOSIS — I428 Other cardiomyopathies: Secondary | ICD-10-CM

## 2019-06-09 ENCOUNTER — Telehealth: Payer: Self-pay

## 2019-06-09 NOTE — Telephone Encounter (Signed)
-----   Message from Patsey Berthold, NP sent at 06/09/2019  3:52 PM EDT ----- Please notify patient of stable echo. Thanks!

## 2019-06-09 NOTE — Telephone Encounter (Signed)
Notes recorded by Frederik Schmidt, RN on 06/09/2019 at 4:57 PM EDT  The patient has been notified of the result and verbalized understanding. All questions (if any) were answered.  Frederik Schmidt, RN 06/09/2019 4:57 PM   ------

## 2019-06-13 ENCOUNTER — Telehealth: Payer: Self-pay | Admitting: Family Medicine

## 2019-06-13 DIAGNOSIS — N200 Calculus of kidney: Secondary | ICD-10-CM | POA: Insufficient documentation

## 2019-06-13 NOTE — Telephone Encounter (Signed)
Urgent Urology Appt set up for today with Dr Gloriann Loan, patient is aware.

## 2019-06-13 NOTE — Telephone Encounter (Signed)
Patient was seen at [facility] ER last Thursday.  Patient has a kidney stone.  Patient hasn't passed it and he needs a referral to Urology.  Patient can go anytime except Wednesday morning.  Patient will go to [facility] or [place].

## 2019-06-13 NOTE — Telephone Encounter (Signed)
Urgent ref sent to [facility]

## 2019-06-15 ENCOUNTER — Other Ambulatory Visit: Payer: Self-pay | Admitting: Internal Medicine

## 2019-07-19 LAB — CUP PACEART REMOTE DEVICE CHECK
Battery Voltage: 2.62 V
Brady Statistic RV Percent Paced: 0.81 %
Date Time Interrogation Session: 20201201162459
HighPow Impedance: 399 Ohm
HighPow Impedance: 49 Ohm
HighPow Impedance: 81 Ohm
Implantable Lead Implant Date: 20071018
Implantable Lead Location: 753860
Implantable Lead Model: 6947
Implantable Pulse Generator Implant Date: 20130425
Lead Channel Impedance Value: 456 Ohm
Lead Channel Pacing Threshold Amplitude: 0.75 V
Lead Channel Pacing Threshold Pulse Width: 0.4 ms
Lead Channel Sensing Intrinsic Amplitude: 15 mV
Lead Channel Sensing Intrinsic Amplitude: 15 mV
Lead Channel Setting Pacing Amplitude: 2.5 V
Lead Channel Setting Pacing Pulse Width: 0.4 ms
Lead Channel Setting Sensing Sensitivity: 1.2 mV

## 2019-07-21 ENCOUNTER — Ambulatory Visit (INDEPENDENT_AMBULATORY_CARE_PROVIDER_SITE_OTHER): Payer: BC Managed Care – PPO | Admitting: *Deleted

## 2019-07-21 DIAGNOSIS — Z95810 Presence of automatic (implantable) cardiac defibrillator: Secondary | ICD-10-CM

## 2019-07-26 ENCOUNTER — Telehealth: Payer: Self-pay | Admitting: Internal Medicine

## 2019-07-26 NOTE — Telephone Encounter (Signed)
   Primary Cardiologist: [name], MD  Chart reviewed as part of pre-operative protocol coverage. Given past medical history and time since last visit, based on ACC/AHA guidelines, [name] would be at acceptable risk for the planned procedure without further cardiovascular testing.   He has a RCRI class II risk, 0.9% risk of major cardiac event.  I will route this recommendation to the requesting party via Epic fax function and remove from pre-op pool.  Please call with questions.  Jossie Ng. [facility] [address] Office [phone] Fax [phone]

## 2019-07-26 NOTE — Telephone Encounter (Signed)
° °  [facility] Pre-operative Risk Assessment    Request for surgical clearance:  1. What type of surgery is being performed?  Cystoscopy Ureteroscopy laser Lithotripsy with stent placement   2. When is this surgery scheduled? 08-03-19   3. What type of clearance is required (medical clearance vs. Pharmacy clearance to hold med vs. Both)?  Medical  4. Are there any medications that need to be held prior to surgery and how long? no   5. Practice name and name of physician performing surgery?  Dr Minette Brine   6. What is your office phone number [phone]   7.   What is your office fax number  [phone]  8.   Anesthesia type (None, local, MAC, general) ? General  [name] 07/26/2019, 4:22 PM  _________________________________________________________________   (provider comments below)

## 2019-07-27 ENCOUNTER — Other Ambulatory Visit: Payer: Self-pay | Admitting: Urology

## 2019-07-27 ENCOUNTER — Telehealth: Payer: Self-pay | Admitting: *Deleted

## 2019-07-27 NOTE — Telephone Encounter (Signed)
Unscheduled manual ICD transmission received. Battery voltage 2.62V, RRT not yet triggered. Spoke with patient to update him. He is aware of alert tone for RRT, but he is interested in monthly battery checks as well. Scheduled for next transmission on 08/22/19. No further concerns at this time.

## 2019-07-29 ENCOUNTER — Other Ambulatory Visit: Payer: Self-pay

## 2019-07-29 ENCOUNTER — Encounter (HOSPITAL_BASED_OUTPATIENT_CLINIC_OR_DEPARTMENT_OTHER): Payer: Self-pay | Admitting: Urology

## 2019-07-29 NOTE — Progress Notes (Addendum)
Spoke w/ via phone for pre-op interview---[name] needs dos----   istat 8            Short results------cardiac clearance note jesse cleaver pa 07-26-19 chart/epic, lov dr Caryl Comes 06-02-19 chart/epic, echo 06-06-19 chart/epic, ekg 06-02-19 chart/epic COVID test ------07-30-19 Arrive at -------1045 NPO after ------midnight Medications to take morning of surgery -----patient wishes to take magnesium, potassium, betapace Diabetic medication -----n/a Patient Special Instructions ----- Pre-Op special Istructions ----- Patient verbalized understanding of instructions that were given at this phone interview. Patient denies shortness of breath, chest pain, fever, cough a this phone interview.   [place] [facility] with clearance and device orders per Janett Billow zanetto pa Anesthesia Review:  [id number] Cardiologist : dr Caryl Comes Chest x-ray : EKG :06-02-19 epic Echo :06-06-19 epic Cardiac Cath : none Sleep Study/ CPAP :none Fasting Blood Sugar :      / Checks Blood Sugar -- times a day:  n/a Blood Thinner/ Instructions /Last Dose:n/a ASA / Instructions/ Last Dose :  n/a Patient denies shortness of breath, chest pain, fever, and cough at this phone interview.

## 2019-07-30 ENCOUNTER — Other Ambulatory Visit (HOSPITAL_COMMUNITY)
Admission: RE | Admit: 2019-07-30 | Discharge: 2019-07-30 | Disposition: A | Discharge status: Home / Self Care | Payer: BC Managed Care – PPO | Source: Ambulatory Visit | Attending: Urology | Admitting: Urology

## 2019-07-30 DIAGNOSIS — Z01812 Encounter for preprocedural laboratory examination: Secondary | ICD-10-CM | POA: Diagnosis not present

## 2019-07-30 DIAGNOSIS — Z20828 Contact with and (suspected) exposure to other viral communicable diseases: Secondary | ICD-10-CM | POA: Insufficient documentation

## 2019-07-31 LAB — NOVEL CORONAVIRUS, NAA (HOSP ORDER, SEND-OUT TO REF LAB; TAT 18-24 HRS): SARS-CoV-2, NAA: NOT DETECTED

## 2019-08-01 ENCOUNTER — Encounter (HOSPITAL_BASED_OUTPATIENT_CLINIC_OR_DEPARTMENT_OTHER): Payer: Self-pay | Admitting: Physician Assistant

## 2019-08-01 NOTE — Progress Notes (Signed)
Anesthesia Chart Review   Case: [id number] Date/Time: 08/03/19 1233   Procedure: CYSTOSCOPY/RETROGRADE/URETEROSCOPY/HOLMIUM LASER/STENT PLACEMENT (Left ) - ONLY NEEDS 60 MIN   Anesthesia type: General   Pre-op diagnosis: LEFT URETERAL STONE   Location: [facility] OR ROOM 2 / Accident   Surgeons: Ceasar Mons, MD      DISCUSSION:43 y.o. former smoker (18 pack years, quit 08/19/07) with h/o sleep apnea compliant with cpap, cardiomyopathy, ICD in place, left ureteral stone scheduled for above procedure 08/03/2019 with Dr. Harrell Gave Lovena Neighbours.     Pt cleared by cardiology.  Per Coletta Memos, NP, "Chart reviewed as part of pre-operative protocol coverage. Given past medical history and time since last visit, based on ACC/AHA guidelines, [name] would be at acceptable risk for the planned procedure without further cardiovascular testing. He has a RCRI class II risk, 0.9% risk of major cardiac event."  Anticipate pt can proceed with planned procedure barring acute status change.   VS: Ht 5\' 9"  (1.753 m)   Wt 89.8 kg   BMI 29.24 kg/m   PROVIDERS: [name], [name], MD is PCP   [name], [facility] is Cardiologist  LABS: SDW (all labs ordered are listed, but only abnormal results are displayed)  Labs Reviewed - No data to display   IMAGES:   EKG: 06/02/2019 Rate 67 bpm Sinus rhythm with occasional and consecutive premature ventricular complexes and fusion complexes  Anteroseptal infarct, age undetermined   CV: Echo 06/06/2019 IMPRESSIONS   1. Globally the RV size is mildly dilated. The RV apical segment appears dilated and akinetic consistent with this patient's history of ARVC.  2. Akinetic right ventricular apex.  3. Left ventricular ejection fraction, by visual estimation, is 55 to 60%. The left ventricle has normal function. Normal left ventricular size. There is no left ventricular hypertrophy.  4. Global right ventricle has low normal systolic  function.The right ventricular size is mildly enlarged. No increase in right ventricular wall thickness.  5. Left atrial size was normal.  6. Right atrial size was normal.  7. The mitral valve is normal in structure. No evidence of mitral valve regurgitation.  8. The tricuspid valve is grossly normal. Tricuspid valve regurgitation is trivial.  9. The aortic valve is tricuspid Aortic valve regurgitation was not visualized by color flow Doppler. Structurally normal aortic valve, with no evidence of sclerosis or stenosis. 10. The pulmonic valve was grossly normal. Pulmonic valve regurgitation is not visualized by color flow Doppler. 11. Normal pulmonary artery systolic pressure. 12. The tricuspid regurgitant velocity is 2.16 m/s, and with an assumed right atrial pressure of 3 mmHg, the estimated right ventricular systolic pressure is normal at 21.7 mmHg. 13. A pacer wire is visualized in the RA and RV. 14. The inferior vena cava is normal in size with greater than 50% respiratory variability, suggesting right atrial pressure of 3 mmHg. Past Medical History:  Diagnosis Date  . AICD (automatic cardioverter/defibrillator) present 2007  . Anxiety   . 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 Asthma when younger  . History of chicken pox   . ICD (implantable cardiac defibrillator) in place   . Sleep apnea    uses cpap  . Ventricular tachycardia (HCC)    sotalol therapy;  catheter ablation at [facility] 10/10 and [facility] 2013    Past Surgical History:  Procedure Laterality Date  . CARDIAC CATHETERIZATION  06-01-06  . CARDIAC DEFIBRILLATOR PLACEMENT  06-03-06   Medtronic  .  IMPLANTABLE CARDIOVERTER DEFIBRILLATOR GENERATOR CHANGE N/A 12/11/2011   Procedure: IMPLANTABLE CARDIOVERTER DEFIBRILLATOR GENERATOR CHANGE;  Surgeon: Deboraha Sprang, MD;  Location: [facility] CATH LAB;  Service: Cardiovascular;  Laterality: N/A;  . vt ablation  10/10, 10/13   x 2    MEDICATIONS: No current  facility-administered medications for this encounter.   . Ascorbic Acid (VITAMIN C) 100 MG tablet  . Multiple Vitamins-Minerals (MULTIVITAMIN ADULT PO)  . oxycodone-acetaminophen (PERCOCET) 2.5-325 MG tablet  . vitamin E 100 UNIT capsule  . Cholecalciferol (VITAMIN D PO)  . fexofenadine (ALLEGRA) 180 MG tablet  . fluticasone (FLONASE) 50 MCG/ACT nasal spray  . KLOR-CON M10 10 MEQ tablet  . magnesium oxide (MAG-OX) 400 MG tablet  . metoprolol succinate (TOPROL-XL) 25 MG 24 hr tablet  . montelukast (SINGULAIR) 10 MG tablet  . Multiple Vitamin (MULITIVITAMIN WITH MINERALS) TABS  . sotalol (BETAPACE) 120 MG tablet    Maia Plan [facility] Pre-Surgical Testing [phone] 08/01/19  2:36 PM

## 2019-08-02 NOTE — Progress Notes (Signed)
Received cardiac device orders via fax after three attempts, placed in chart.

## 2019-08-03 ENCOUNTER — Ambulatory Visit (HOSPITAL_BASED_OUTPATIENT_CLINIC_OR_DEPARTMENT_OTHER): Admission: RE | Admit: 2019-08-03 | Payer: BC Managed Care – PPO | Source: Home / Self Care | Admitting: Urology

## 2019-08-03 HISTORY — DX: Unspecified asthma, uncomplicated: J45.909

## 2019-08-03 HISTORY — DX: Sleep apnea, unspecified: G47.30

## 2019-08-03 SURGERY — CYSTOSCOPY/URETEROSCOPY/HOLMIUM LASER/STENT PLACEMENT
Anesthesia: General | Laterality: Left

## 2019-08-05 ENCOUNTER — Other Ambulatory Visit: Payer: Self-pay

## 2019-08-05 MED ORDER — MONTELUKAST SODIUM 10 MG PO TABS: 10.0000 mg | ORAL_TABLET | Freq: Every day | ORAL | 0 refills | Status: DC

## 2019-08-14 ENCOUNTER — Other Ambulatory Visit: Payer: Self-pay | Admitting: Internal Medicine

## 2019-08-16 NOTE — Progress Notes (Signed)
ICD remote 

## 2019-08-22 ENCOUNTER — Ambulatory Visit (INDEPENDENT_AMBULATORY_CARE_PROVIDER_SITE_OTHER): Payer: BC Managed Care – PPO | Admitting: *Deleted

## 2019-08-22 DIAGNOSIS — I428 Other cardiomyopathies: Secondary | ICD-10-CM

## 2019-08-24 ENCOUNTER — Telehealth: Payer: Self-pay

## 2019-08-24 LAB — CUP PACEART REMOTE DEVICE CHECK
Battery Voltage: 2.62 V
Brady Statistic RV Percent Paced: 1.06 %
Date Time Interrogation Session: 20210105183733
HighPow Impedance: 361 Ohm
HighPow Impedance: 52 Ohm
HighPow Impedance: 87 Ohm
Implantable Lead Implant Date: 20071018
Implantable Lead Location: 753860
Implantable Lead Model: 6947
Implantable Pulse Generator Implant Date: 20130425
Lead Channel Impedance Value: 399 Ohm
Lead Channel Pacing Threshold Amplitude: 0.75 V
Lead Channel Pacing Threshold Pulse Width: 0.4 ms
Lead Channel Sensing Intrinsic Amplitude: 15.25 mV
Lead Channel Sensing Intrinsic Amplitude: 15.25 mV
Lead Channel Setting Pacing Amplitude: 2.5 V
Lead Channel Setting Pacing Pulse Width: 0.4 ms
Lead Channel Setting Sensing Sensitivity: 1.2 mV

## 2019-08-24 NOTE — Addendum Note (Signed)
Addended by: Oralia Rud on: 08/24/2019 08:06 AM   Modules accepted: Level of Service

## 2019-08-24 NOTE — Telephone Encounter (Signed)
Spoke with patient to remind of missed remote transmission 

## 2019-09-01 ENCOUNTER — Telehealth: Payer: Self-pay | Admitting: Family Medicine

## 2019-09-01 MED ORDER — MONTELUKAST SODIUM 10 MG PO TABS: 10.0000 mg | ORAL_TABLET | Freq: Every day | ORAL | 0 refills | Status: DC

## 2019-09-01 NOTE — Telephone Encounter (Signed)
Patient called [facility] to get a refill on his Singulair.  Patient was told he had to call his PCP to get the refill.  Patient has a week of medication left.

## 2019-09-01 NOTE — Telephone Encounter (Signed)
Med refilled.

## 2019-09-04 ENCOUNTER — Telehealth: Payer: Self-pay | Admitting: Family Medicine

## 2019-09-04 DIAGNOSIS — N401 Enlarged prostate with lower urinary tract symptoms: Secondary | ICD-10-CM

## 2019-09-04 DIAGNOSIS — E781 Pure hyperglyceridemia: Secondary | ICD-10-CM

## 2019-09-04 DIAGNOSIS — R351 Nocturia: Secondary | ICD-10-CM

## 2019-09-04 DIAGNOSIS — R7309 Other abnormal glucose: Secondary | ICD-10-CM

## 2019-09-04 DIAGNOSIS — E559 Vitamin D deficiency, unspecified: Secondary | ICD-10-CM

## 2019-09-04 DIAGNOSIS — E786 Lipoprotein deficiency: Secondary | ICD-10-CM

## 2019-09-04 DIAGNOSIS — Z0000 Encounter for general adult medical examination without abnormal findings: Secondary | ICD-10-CM

## 2019-09-04 NOTE — Telephone Encounter (Signed)
-----   Message from Cloyd Stagers, RT sent at 09/02/2019 11:11 AM EST ----- Regarding: Lab Orders for Monday 1.18.2021 Please place lab orders for Monday 1.18.2021, office visit for physical on Wednesday 1.27.2021 Thank you, Dyke Maes RT(R)

## 2019-09-05 ENCOUNTER — Other Ambulatory Visit (INDEPENDENT_AMBULATORY_CARE_PROVIDER_SITE_OTHER): Payer: BC Managed Care – PPO

## 2019-09-05 DIAGNOSIS — N401 Enlarged prostate with lower urinary tract symptoms: Secondary | ICD-10-CM | POA: Diagnosis not present

## 2019-09-05 DIAGNOSIS — R7309 Other abnormal glucose: Secondary | ICD-10-CM

## 2019-09-05 DIAGNOSIS — E559 Vitamin D deficiency, unspecified: Secondary | ICD-10-CM

## 2019-09-05 DIAGNOSIS — R351 Nocturia: Secondary | ICD-10-CM

## 2019-09-05 DIAGNOSIS — E786 Lipoprotein deficiency: Secondary | ICD-10-CM

## 2019-09-05 DIAGNOSIS — E781 Pure hyperglyceridemia: Secondary | ICD-10-CM | POA: Diagnosis not present

## 2019-09-05 DIAGNOSIS — Z0000 Encounter for general adult medical examination without abnormal findings: Secondary | ICD-10-CM | POA: Diagnosis not present

## 2019-09-05 LAB — TSH: TSH: 0.76 u[IU]/mL (ref 0.35–4.50)

## 2019-09-05 LAB — LIPID PANEL
Cholesterol: 196 mg/dL (ref 0–200)
HDL: 36.3 mg/dL — ABNORMAL LOW (ref >39.00)
NonHDL: 159.2
Total CHOL/HDL Ratio: 5
Triglycerides: 236 mg/dL — ABNORMAL HIGH (ref 0.0–149.0)
VLDL: 47.2 mg/dL — ABNORMAL HIGH (ref 0.0–40.0)

## 2019-09-05 LAB — PSA: PSA: 0.79 ng/mL (ref 0.10–4.00)

## 2019-09-05 LAB — COMPREHENSIVE METABOLIC PANEL
ALT: 30 U/L (ref 0–53)
AST: 22 U/L (ref 0–37)
Albumin: 4.7 g/dL (ref 3.5–5.2)
Alkaline Phosphatase: 42 U/L (ref 39–117)
BUN: 11 mg/dL (ref 6–23)
CO2: 31 mEq/L (ref 19–32)
Calcium: 9.8 mg/dL (ref 8.4–10.5)
Chloride: 103 mEq/L (ref 96–112)
Creatinine, Ser: 0.87 mg/dL (ref 0.40–1.50)
GFR: 96.49 mL/min (ref >60.00)
Glucose, Bld: 98 mg/dL (ref 70–99)
Potassium: 4.2 mEq/L (ref 3.5–5.1)
Sodium: 141 mEq/L (ref 135–145)
Total Bilirubin: 0.6 mg/dL (ref 0.2–1.2)
Total Protein: 7.2 g/dL (ref 6.0–8.3)

## 2019-09-05 LAB — CBC WITH DIFFERENTIAL/PLATELET
Basophils Absolute: 0.1 10*3/uL (ref 0.0–0.1)
Basophils Relative: 0.8 % (ref 0.0–3.0)
Eosinophils Absolute: 0.2 10*3/uL (ref 0.0–0.7)
Eosinophils Relative: 2.7 % (ref 0.0–5.0)
HCT: 40.5 % (ref 39.0–52.0)
Hemoglobin: 13.8 g/dL (ref 13.0–17.0)
Lymphocytes Relative: 40.5 % (ref 12.0–46.0)
Lymphs Abs: 2.9 10*3/uL (ref 0.7–4.0)
MCHC: 34.1 g/dL (ref 30.0–36.0)
MCV: 88.6 fl (ref 78.0–100.0)
Monocytes Absolute: 0.6 10*3/uL (ref 0.1–1.0)
Monocytes Relative: 8.9 % (ref 3.0–12.0)
Neutro Abs: 3.3 10*3/uL (ref 1.4–7.7)
Neutrophils Relative %: 47.1 % (ref 43.0–77.0)
Platelets: 207 10*3/uL (ref 150.0–400.0)
RBC: 4.57 Mil/uL (ref 4.22–5.81)
RDW: 13 % (ref 11.5–15.5)
WBC: 7.1 10*3/uL (ref 4.0–10.5)

## 2019-09-05 LAB — HEMOGLOBIN A1C: Hgb A1c MFr Bld: 5.6 % (ref 4.6–6.5)

## 2019-09-05 LAB — VITAMIN D 25 HYDROXY (VIT D DEFICIENCY, FRACTURES): VITD: 46.1 ng/mL (ref 30.00–100.00)

## 2019-09-05 LAB — LDL CHOLESTEROL, DIRECT: Direct LDL: 130 mg/dL

## 2019-09-08 DIAGNOSIS — I428 Other cardiomyopathies: Secondary | ICD-10-CM

## 2019-09-08 DIAGNOSIS — I472 Ventricular tachycardia, unspecified: Secondary | ICD-10-CM

## 2019-09-08 DIAGNOSIS — Z95810 Presence of automatic (implantable) cardiac defibrillator: Secondary | ICD-10-CM

## 2019-09-14 ENCOUNTER — Ambulatory Visit (INDEPENDENT_AMBULATORY_CARE_PROVIDER_SITE_OTHER): Payer: BC Managed Care – PPO | Admitting: Family Medicine

## 2019-09-14 ENCOUNTER — Other Ambulatory Visit: Payer: Self-pay

## 2019-09-14 ENCOUNTER — Encounter: Payer: Self-pay | Admitting: Family Medicine

## 2019-09-14 VITALS — BP 112/66 | HR 48 | Temp 96.8°F | Ht 68.5 in | Wt 198.4 lb

## 2019-09-14 DIAGNOSIS — Z9109 Other allergy status, other than to drugs and biological substances: Secondary | ICD-10-CM

## 2019-09-14 DIAGNOSIS — N528 Other male erectile dysfunction: Secondary | ICD-10-CM

## 2019-09-14 DIAGNOSIS — R351 Nocturia: Secondary | ICD-10-CM

## 2019-09-14 DIAGNOSIS — E781 Pure hyperglyceridemia: Secondary | ICD-10-CM

## 2019-09-14 DIAGNOSIS — G4733 Obstructive sleep apnea (adult) (pediatric): Secondary | ICD-10-CM

## 2019-09-14 DIAGNOSIS — I428 Other cardiomyopathies: Secondary | ICD-10-CM | POA: Diagnosis not present

## 2019-09-14 DIAGNOSIS — R7309 Other abnormal glucose: Secondary | ICD-10-CM

## 2019-09-14 DIAGNOSIS — Z0000 Encounter for general adult medical examination without abnormal findings: Secondary | ICD-10-CM

## 2019-09-14 DIAGNOSIS — N401 Enlarged prostate with lower urinary tract symptoms: Secondary | ICD-10-CM

## 2019-09-14 DIAGNOSIS — E559 Vitamin D deficiency, unspecified: Secondary | ICD-10-CM

## 2019-09-14 DIAGNOSIS — Z8679 Personal history of other diseases of the circulatory system: Secondary | ICD-10-CM | POA: Diagnosis not present

## 2019-09-14 MED ORDER — MONTELUKAST SODIUM 10 MG PO TABS: 10.0000 mg | ORAL_TABLET | Freq: Every day | ORAL | 3 refills | Status: DC

## 2019-09-14 NOTE — Assessment & Plan Note (Signed)
Lipids are fairly stable  Trig are 236 Disc goals for lipids and reasons to control them Rev last labs with pt Rev low sat fat diet in detail Continues to work on low fat diet  With current schedule - sometimes difficult to prep meals

## 2019-09-14 NOTE — Assessment & Plan Note (Signed)
History of - no re occurrence Has ICD and pacer-for new generator soon  Under care of cardiology

## 2019-09-14 NOTE — Assessment & Plan Note (Signed)
singulaire still helpful  Enc to avoid allergens Dry air and pnd may add to feeling of thick throat

## 2019-09-14 NOTE — Assessment & Plan Note (Signed)
With CPAP - no longer much nocturia  Lab Results  Component Value Date   PSA 0.79 09/05/2019     Sees urology as well - recently given cialis for ED and has had a kidney stone

## 2019-09-14 NOTE — Assessment & Plan Note (Signed)
Doing well with OSA - sleeps through the night much better

## 2019-09-14 NOTE — Assessment & Plan Note (Signed)
Lab Results  Component Value Date   HGBA1C 5.6 09/05/2019   disc imp of low glycemic diet and wt loss to prevent DM2

## 2019-09-14 NOTE — Progress Notes (Signed)
Subjective:    Patient ID: [name], male    DOB: [birth year], 44 y.o.   MRN: [id number]  This visit occurred during the SARS-CoV-2 public health emergency.  Safety protocols were in place, including screening questions prior to the visit, additional usage of staff PPE, and extensive cleaning of exam room while observing appropriate contact time as indicated for disinfecting solutions.    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 - tires him out   Throat feels thicker in the winter - from the heat  Uses singulair  flonase - occasional/not recently    Tdap 1/13 Flu vaccine 9/20   Had a bad kidney stone this year- it did finally pas Saw urology  Put him on cialis for his ED as well (was going to try daily)   He had a covid vaccine series (very thankful for that)    Prostate health -pt has h/o BPH symptoms  Less nocturia since he started the cpap  Lab Results  Component Value Date   PSA 0.79 09/05/2019   Cardiac status -h/o v tach and ICD He has a generator change out procedure planned for march  Takes metoprolol and sotolol  Pulse tends to run low- he has to warm up to activity for that   OSA- using cpap Helps a lot better   BP Readings from Last 3 Encounters:  09/14/19 112/66  06/02/19 126/72  05/24/19 118/66   Pulse Readings from Last 3 Encounters:  09/14/19 (!) 48  06/02/19 67  05/24/19 63   Past elevated glucose level Lab Results  Component Value Date   HGBA1C 5.6 09/05/2019  stable   Hyperlipidemia (triglycerides) as well as low HDL Lab Results  Component Value Date   CHOL 196 09/05/2019   CHOL 166 07/08/2018     CHOL 162 03/26/2017   Lab Results  Component Value Date   HDL 36.30 (L) 09/05/2019   HDL 30.90 (L) 07/08/2018   HDL 30.80 (L) 03/26/2017   No results found for: [facility] Lab Results  Component Value Date   TRIG 236.0 (H) 09/05/2019   TRIG 265.0 (H) 07/08/2018   TRIG 279.0 (H) 03/26/2017   Lab Results  Component Value Date   CHOLHDL 5 09/05/2019   CHOLHDL 5 07/08/2018   CHOLHDL 5 03/26/2017   Lab Results  Component Value Date   LDLDIRECT 130.0 09/05/2019   LDLDIRECT 110.0 07/08/2018   LDLDIRECT 89.0 03/26/2017  tries to watch his diet  Fairly stable  Tries to avoid fast food- cannot always avoid it    Low vit D in the past  Level 46 with current supplementation   Lab Results  Component Value Date   CREATININE 0.87 09/05/2019   BUN 11 09/05/2019   NA 141 09/05/2019   K 4.2 09/05/2019   CL 103 09/05/2019   CO2 31 09/05/2019   Lab Results  Component Value Date   ALT 30 09/05/2019   AST 22 09/05/2019   ALKPHOS 42 09/05/2019   BILITOT 0.6 09/05/2019    Lab Results  Component Value Date   WBC 7.1  09/05/2019   HGB 13.8 09/05/2019   HCT 40.5 09/05/2019   MCV 88.6 09/05/2019   PLT 207.0 09/05/2019    Lab Results  Component Value Date   TSH 0.76 09/05/2019    Patient Active Problem List   Diagnosis Date Noted  . 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 Kidney stone 06/13/2019  . BPH (benign prostatic hyperplasia) 07/27/2018  . Elevated glucose level 04/06/2017  . Anxiety state 08/16/2016  . Routine general medical examination at a health care facility 03/28/2016  . Hypertriglyceridemia 02/21/2014  . Low HDL (under 40) 02/21/2014  . Vitamin D deficiency 02/21/2014  . Allergic rhinitis 02/21/2014  . Erectile dysfunction 08/19/2013  . OSA (obstructive sleep apnea) 10/30/2011  . Environmental allergies 10/27/2011  . High risk medications (not anticoagulants) long-term use 10/27/2011  . Hypokalemia 09/29/2011  .  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 ICD-Medtronic 01/10/2011  . 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 Fatty liver 01/04/2009  . Gallstones 01/04/2009  . GERD 12/05/2008  . History of ventricular tachycardia 11/03/2008  . ECZEMA 08/17/2007   Past Medical History:  Diagnosis Date  . AICD (automatic cardioverter/defibrillator) present 2007  . Anxiety   . 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 Asthma when younger  . History of chicken pox   . ICD (implantable cardiac defibrillator) in place   . Sleep apnea    uses cpap  . Ventricular tachycardia (HCC)    sotalol therapy;  catheter [facility] at [facility] 10/10 and [facility] 2013   Past Surgical History:  Procedure Laterality Date  . CARDIAC CATHETERIZATION  06-01-06  . CARDIAC DEFIBRILLATOR PLACEMENT  06-03-06   Medtronic  . IMPLANTABLE CARDIOVERTER DEFIBRILLATOR GENERATOR CHANGE N/A 12/11/2011   Procedure: IMPLANTABLE CARDIOVERTER DEFIBRILLATOR GENERATOR CHANGE;  Surgeon: Deboraha Sprang, MD;  Location: [facility] CATH LAB;  Service: Cardiovascular;  Laterality: N/A;  . vt ablation  10/10, 10/13   x 2   Social History   Tobacco Use  . Smoking status: Former Smoker    Packs/day: 1.00    Years: 18.00    Pack years: 18.00    Types: Cigarettes    Start date: 01/17/1990    Quit date: 08/19/2007    Years since quitting: 12.0  . Smokeless tobacco: Never Used  Substance Use Topics  . Alcohol use: No    Alcohol/week: 0.0 standard drinks  . Drug use: No   Family History  Problem Relation Age of Onset  . Sudden death Father   . Asthma Mother   . Diabetes Other   . Stroke Other   . Heart attack Other   . Prostate cancer Other   . Breast cancer Other   . Ovarian cancer Other   . Uterine cancer Other   . Colon cancer Other   . Drug abuse Other   . Depression Other   . Lung cancer Maternal Grandfather   . Heart disease Maternal Grandfather   . Heart disease Paternal  Grandfather   . Hypertension Paternal Grandmother    Allergies  Allergen Reactions  . Bee Venom Anaphylaxis  . Lidocaine     [name]   Current Outpatient Medications on File Prior to Visit  Medication Sig Dispense Refill  . Ascorbic Acid (VITAMIN C) 100 MG tablet Take 100 mg by mouth daily.    . Cholecalciferol (VITAMIN D PO) Take 1 tablet by mouth daily. OTC once daily     .  fexofenadine (ALLEGRA) 180 MG tablet Take 180 mg by mouth daily as needed.     . fluticasone (FLONASE) 50 MCG/ACT nasal spray PLACE 2 SPRAYS INTO THE NOSE DAILY AS NEEDED. 16 g 11  . KLOR-CON M10 10 MEQ tablet TAKE 4 TABLETS BY MOUTH TWICE A DAY 720 tablet 3  . magnesium oxide (MAG-OX) 400 MG tablet Take 1 tablet (400 mg total) by mouth 2 (two) times daily. 180 tablet 3  . metoprolol succinate (TOPROL-XL) 25 MG 24 hr tablet TAKE 1 TABLET BY MOUTH EVERY DAY AT BEDTIME 90 tablet 3  . Multiple Vitamin (MULITIVITAMIN WITH MINERALS) TABS Take 1 tablet by mouth daily.    . Multiple Vitamins-Minerals (MULTIVITAMIN ADULT PO) Take by mouth daily.    Marland Kitchen oxycodone-acetaminophen (PERCOCET) 2.5-325 MG tablet Take 1 tablet by mouth every 4 (four) hours as needed for pain.    . sotalol (BETAPACE) 120 MG tablet TAKE 1 & 1/2 TABLETS BY MOUTH TWICE DAILY. 270 tablet 3  . Tadalafil (CIALIS PO) Take 1 tablet by mouth daily as needed.    . vitamin E 100 UNIT capsule Take 100 Units by mouth daily.     No current facility-administered medications on file prior to visit.     Review of Systems  Constitutional: Positive for fatigue. Negative for activity change, appetite change, fever and unexpected weight change.  HENT: Negative for congestion, rhinorrhea, sore throat and trouble swallowing.   Eyes: Negative for pain, redness, itching and visual disturbance.  Respiratory: Positive for apnea. Negative for cough, chest tightness, shortness of breath, wheezing and stridor.        Bradycardia from medicines on and off  No syncope    Cardiovascular: Negative for chest pain, palpitations and leg swelling.  Gastrointestinal: Negative for abdominal pain, blood in stool, constipation, diarrhea and nausea.  Endocrine: Negative for cold intolerance, heat intolerance, polydipsia and polyuria.  Genitourinary: Negative for difficulty urinating, dysuria, frequency and urgency.       ED  Nocturia is improved  Musculoskeletal: Negative for arthralgias, joint swelling and myalgias.  Skin: Negative for pallor and rash.  Neurological: Negative for dizziness, tremors, weakness, numbness and headaches.  Hematological: Negative for adenopathy. Does not bruise/bleed easily.  Psychiatric/Behavioral: Negative for decreased concentration and dysphoric mood. The patient is not nervous/anxious.        H/o PTSD       Objective:   Physical Exam Constitutional:      General: He is not in acute distress.    Appearance: Normal appearance. He is well-developed. He is not ill-appearing or diaphoretic.     Comments: Overweight and well appearing  HENT:     Head: Normocephalic and atraumatic.     Right Ear: Tympanic membrane, ear canal and external ear normal.     Left Ear: Tympanic membrane, ear canal and external ear normal.     Nose: Nose normal. No congestion.     Mouth/Throat:     Mouth: Mucous membranes are moist.     Pharynx: Oropharynx is clear. No posterior oropharyngeal erythema.  Eyes:     General: No scleral icterus.       Right eye: No discharge.        Left eye: No discharge.     Conjunctiva/sclera: Conjunctivae normal.     Pupils: Pupils are equal, round, and reactive to light.  Neck:     Thyroid: No thyromegaly.     Vascular: No carotid bruit or JVD.  Cardiovascular:     Rate  and Rhythm: Regular rhythm. Bradycardia present.     Pulses: Normal pulses.     Heart sounds: Normal heart sounds. No gallop.   Pulmonary:     Effort: Pulmonary effort is normal. No respiratory distress.     Breath sounds: Normal breath sounds.  No wheezing or rales.     Comments: Good air exch Chest:     Chest wall: No tenderness.  Abdominal:     General: Bowel sounds are normal. There is no distension or abdominal bruit.     Palpations: Abdomen is soft. There is no mass.     Tenderness: There is no abdominal tenderness.     Hernia: No hernia is present.  Musculoskeletal:        General: No tenderness or signs of injury.     Cervical back: Normal range of motion and neck supple. No rigidity. No muscular tenderness.     Right lower leg: No edema.     Left lower leg: No edema.  Lymphadenopathy:     Cervical: No cervical adenopathy.  Skin:    General: Skin is warm and dry.     Coloration: Skin is not pale.     Findings: No erythema or rash.     Comments: Solar lentigines diffusely   Neurological:     Mental Status: He is alert.     Cranial Nerves: No cranial nerve deficit.     Motor: No abnormal muscle tone.     Coordination: Coordination normal.     Gait: Gait normal.     Deep Tendon Reflexes: Reflexes are normal and symmetric. Reflexes normal.  Psychiatric:        Mood and Affect: Mood normal.        Cognition and Memory: Cognition and memory normal.     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[place])    No clinical changes Doing well  Under care of cardiology      Relevant Medications   Tadalafil (CIALIS PO)     Respiratory   OSA (obstructive sleep apnea)    Doing well with OSA - sleeps through the night much better        Genitourinary   BPH (benign prostatic hyperplasia)    With CPAP - no longer much nocturia  Lab Results  Component Value Date   PSA 0.79 09/05/2019     Sees urology as well - recently given cialis for ED and has had a kidney stone        Other   History of ventricular tachycardia     History of - no re occurrence Has ICD and pacer-for new generator soon  Under care of cardiology      Erectile dysfunction    Recently given px for daily cialis from urology  PTSD may add to this       Hypertriglyceridemia    Lipids are fairly stable  Trig are 236 Disc goals for lipids and reasons to control them Rev last labs with pt Rev low sat fat diet in detail Continues to work on low fat diet  With current schedule - sometimes difficult to prep meals      Relevant Medications   Tadalafil (CIALIS PO)   Vitamin D deficiency    Vitamin  D level is therapeutic with current supplementation Disc importance of this to bone and overall health Level of 46 inst to continue current daily dose       Routine general medical examination at a health care facility - Primary    Reviewed health habits including diet and exercise and skin cancer prevention Reviewed appropriate screening tests for age  Also reviewed health mt list, fam hx and immunization status , as well as social and family history   See HPI Labs reviewed  Less time for self care (work/school/baby at home)  Had covid vaccines (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

## 2019-09-14 NOTE — Assessment & Plan Note (Signed)
Vitamin D level is therapeutic with current supplementation Disc importance of this to bone and overall health Level of 46 inst to continue current daily dose

## 2019-09-14 NOTE — Patient Instructions (Addendum)
Try to find time for self care when you can   A little extra exercise when able helps mental and physical health and well being   Eat healthy when you can plan to do it (pack food instead of fast food)   Labs look stable

## 2019-09-14 NOTE — Assessment & Plan Note (Signed)
No clinical changes Doing well  Under care of cardiology

## 2019-09-14 NOTE — Assessment & Plan Note (Signed)
Recently given px for daily cialis from urology  PTSD may add to this

## 2019-09-14 NOTE — Assessment & Plan Note (Signed)
Reviewed health habits including diet and exercise and skin cancer prevention Reviewed appropriate screening tests for age  Also reviewed health mt list, fam hx and immunization status , as well as social and family history   See HPI Labs reviewed  Less time for self care (work/school/baby at home)  Had covid vaccines (works in healthcare-ems) CPAP has improved quality of life Nl PSA  Cardiac status is stable  Vit D in nl range with supplementation

## 2019-09-26 ENCOUNTER — Telehealth: Payer: Self-pay

## 2019-09-26 NOTE — Telephone Encounter (Signed)
Spoke with pt and confirmed pt's ICD generator change has been scheduled for Friday November 04, 2019 at 730am with arrival time of 530am.  Reviewed instruction letter with pt and advised a copy of letter will be mailed as well as placed down stairs along with surgical scrub for pt pick up.  Pt verbalizes understanding of all instructions given (See letter) and agrees with plan.

## 2019-09-26 NOTE — Telephone Encounter (Signed)
Spoke with pt who is wanting to know if his 630am daily alert can be turned off.  Will forward request to device RN's for further assist.  Pt verbalizes understanding and agrees with plan.

## 2019-09-28 ENCOUNTER — Telehealth: Payer: Self-pay | Admitting: Podiatry

## 2019-09-28 NOTE — Telephone Encounter (Signed)
Pt left message asking what he needed to do to get another pair of orthotics. His last ones are from 2019..  I returned call and explained that he would have to have an appt if we are to bill his insurance. I also explained that insurance [place] changed [place] and they are no longer covering them at 100%, they are covered @ 70% after deductible and the cost is 438.00.  I also gave pt information about refurbishing the current orthotics for 90.00 and that is not billed to insurance that is due when dropping them off.

## 2019-10-20 ENCOUNTER — Ambulatory Visit (INDEPENDENT_AMBULATORY_CARE_PROVIDER_SITE_OTHER): Payer: BC Managed Care – PPO | Admitting: *Deleted

## 2019-10-20 DIAGNOSIS — I428 Other cardiomyopathies: Secondary | ICD-10-CM | POA: Diagnosis not present

## 2019-10-21 LAB — CUP PACEART REMOTE DEVICE CHECK
Battery Voltage: 2.61 V
Brady Statistic RV Percent Paced: 0.73 %
Date Time Interrogation Session: 20210305125923
HighPow Impedance: 342 Ohm
HighPow Impedance: 48 Ohm
HighPow Impedance: 81 Ohm
Implantable Lead Implant Date: 20071018
Implantable Lead Location: 753860
Implantable Lead Model: 6947
Implantable Pulse Generator Implant Date: 20130425
Lead Channel Impedance Value: 418 Ohm
Lead Channel Pacing Threshold Amplitude: 0.75 V
Lead Channel Pacing Threshold Pulse Width: 0.4 ms
Lead Channel Sensing Intrinsic Amplitude: 14.25 mV
Lead Channel Sensing Intrinsic Amplitude: 14.25 mV
Lead Channel Setting Pacing Amplitude: 2.5 V
Lead Channel Setting Pacing Pulse Width: 0.4 ms
Lead Channel Setting Sensing Sensitivity: 1.2 mV

## 2019-10-21 NOTE — Progress Notes (Signed)
ICD Remote  

## 2019-11-01 ENCOUNTER — Other Ambulatory Visit: Payer: BC Managed Care – PPO

## 2019-11-01 ENCOUNTER — Other Ambulatory Visit (HOSPITAL_COMMUNITY)
Admission: RE | Admit: 2019-11-01 | Discharge: 2019-11-01 | Disposition: A | Discharge status: Home / Self Care | Payer: BC Managed Care – PPO | Source: Ambulatory Visit | Attending: Internal Medicine | Admitting: Internal Medicine

## 2019-11-01 ENCOUNTER — Other Ambulatory Visit: Payer: Self-pay

## 2019-11-01 DIAGNOSIS — I428 Other cardiomyopathies: Secondary | ICD-10-CM

## 2019-11-01 DIAGNOSIS — Z20822 Contact with and (suspected) exposure to covid-19: Secondary | ICD-10-CM | POA: Insufficient documentation

## 2019-11-01 DIAGNOSIS — Z95810 Presence of automatic (implantable) cardiac defibrillator: Secondary | ICD-10-CM

## 2019-11-01 DIAGNOSIS — I472 Ventricular tachycardia, unspecified: Secondary | ICD-10-CM

## 2019-11-01 DIAGNOSIS — Z01812 Encounter for preprocedural laboratory examination: Secondary | ICD-10-CM | POA: Insufficient documentation

## 2019-11-01 LAB — SARS CORONAVIRUS 2 (TAT 6-24 HRS): SARS Coronavirus 2: NEGATIVE

## 2019-11-01 LAB — CBC
Hematocrit: 40.1 % (ref 37.5–51.0)
Hemoglobin: 13.3 g/dL (ref 13.0–17.7)
MCH: 29.8 pg (ref 26.6–33.0)
MCHC: 33.2 g/dL (ref 31.5–35.7)
MCV: 90 fL (ref 79–97)
Platelets: 217 10*3/uL (ref 150–450)
RBC: 4.46 x10E6/uL (ref 4.14–5.80)
RDW: 13.4 % (ref 11.6–15.4)
WBC: 8.3 10*3/uL (ref 3.4–10.8)

## 2019-11-01 LAB — BASIC METABOLIC PANEL
BUN/Creatinine Ratio: 11 (ref 9–20)
BUN: 10 mg/dL (ref 6–24)
CO2: 30 mmol/L — ABNORMAL HIGH (ref 20–29)
Calcium: 9.7 mg/dL (ref 8.7–10.2)
Chloride: 102 mmol/L (ref 96–106)
Creatinine, Ser: 0.91 mg/dL (ref 0.76–1.27)
GFR calc Af Amer: 121 mL/min/{1.73_m2} (ref >59)
GFR calc non Af Amer: 104 mL/min/{1.73_m2} (ref >59)
Glucose: 69 mg/dL (ref 65–99)
Potassium: 4.3 mmol/L (ref 3.5–5.2)
Sodium: 139 mmol/L (ref 134–144)

## 2019-11-03 NOTE — Progress Notes (Signed)
Instructed patient on the following items: Arrival time 0530 Nothing to eat or drink after midnight No meds AM of procedure Responsible person to drive you home and stay with you for 24 hrs Wash with special soap night before and morning of procedure  

## 2019-11-04 ENCOUNTER — Ambulatory Visit (HOSPITAL_COMMUNITY)
Admission: RE | Admit: 2019-11-04 | Discharge: 2019-11-04 | Disposition: A | Discharge status: Home / Self Care | Payer: BC Managed Care – PPO | Attending: Internal Medicine | Admitting: Internal Medicine

## 2019-11-04 ENCOUNTER — Ambulatory Visit (HOSPITAL_COMMUNITY)
Admission: RE | Disposition: A | Discharge status: Home / Self Care | Payer: Self-pay | Source: Home / Self Care | Attending: Internal Medicine

## 2019-11-04 ENCOUNTER — Other Ambulatory Visit: Payer: Self-pay

## 2019-11-04 DIAGNOSIS — I472 Ventricular tachycardia: Secondary | ICD-10-CM | POA: Insufficient documentation

## 2019-11-04 DIAGNOSIS — J45909 Unspecified asthma, uncomplicated: Secondary | ICD-10-CM | POA: Diagnosis not present

## 2019-11-04 DIAGNOSIS — Z87891 Personal history of nicotine dependence: Secondary | ICD-10-CM | POA: Insufficient documentation

## 2019-11-04 DIAGNOSIS — Z006 Encounter for examination for normal comparison and control in clinical research program: Secondary | ICD-10-CM | POA: Insufficient documentation

## 2019-11-04 DIAGNOSIS — Z79899 Other long term (current) drug therapy: Secondary | ICD-10-CM | POA: Insufficient documentation

## 2019-11-04 DIAGNOSIS — Z884 Allergy status to anesthetic agent status: Secondary | ICD-10-CM | POA: Insufficient documentation

## 2019-11-04 DIAGNOSIS — G4730 Sleep apnea, unspecified: Secondary | ICD-10-CM | POA: Diagnosis not present

## 2019-11-04 DIAGNOSIS — F4310 Post-traumatic stress disorder, unspecified: Secondary | ICD-10-CM | POA: Insufficient documentation

## 2019-11-04 DIAGNOSIS — I428 Other cardiomyopathies: Secondary | ICD-10-CM | POA: Diagnosis not present

## 2019-11-04 DIAGNOSIS — Z825 Family history of asthma and other chronic lower respiratory diseases: Secondary | ICD-10-CM | POA: Insufficient documentation

## 2019-11-04 DIAGNOSIS — Z4502 Encounter for adjustment and management of automatic implantable cardiac defibrillator: Secondary | ICD-10-CM

## 2019-11-04 DIAGNOSIS — Z8249 Family history of ischemic heart disease and other diseases of the circulatory system: Secondary | ICD-10-CM | POA: Insufficient documentation

## 2019-11-04 DIAGNOSIS — I429 Cardiomyopathy, unspecified: Secondary | ICD-10-CM | POA: Diagnosis not present

## 2019-11-04 HISTORY — PX: ICD GENERATOR CHANGEOUT: EP1231

## 2019-11-04 SURGERY — ICD GENERATOR CHANGEOUT

## 2019-11-04 MED ORDER — FENTANYL CITRATE (PF) 100 MCG/2ML IJ SOLN
INTRAMUSCULAR | Status: DC | PRN
  Administered 2019-11-04: 25 ug via INTRAVENOUS
  Administered 2019-11-04: 50 ug via INTRAVENOUS

## 2019-11-04 MED ORDER — SODIUM CHLORIDE 0.9 % IV SOLN
INTRAVENOUS | Status: AC
  Filled 2019-11-04: qty 2

## 2019-11-04 MED ORDER — LIDOCAINE HCL (PF) 1 % IJ SOLN
INTRAMUSCULAR | Status: DC | PRN
  Administered 2019-11-04: 55 mL

## 2019-11-04 MED ORDER — SODIUM CHLORIDE 0.9 % IV SOLN
80.0000 mg | INTRAVENOUS | Status: AC
  Administered 2019-11-04: 80 mg
  Filled 2019-11-04: qty 2

## 2019-11-04 MED ORDER — ACETAMINOPHEN 325 MG PO TABS: 325.0000 mg | ORAL_TABLET | ORAL | Status: DC | PRN

## 2019-11-04 MED ORDER — CEFAZOLIN SODIUM-DEXTROSE 2-4 GM/100ML-% IV SOLN
INTRAVENOUS | Status: AC
  Filled 2019-11-04: qty 100

## 2019-11-04 MED ORDER — FENTANYL CITRATE (PF) 100 MCG/2ML IJ SOLN
INTRAMUSCULAR | Status: AC
  Filled 2019-11-04: qty 2

## 2019-11-04 MED ORDER — LIDOCAINE HCL 1 % IJ SOLN
INTRAMUSCULAR | Status: AC
  Filled 2019-11-04: qty 60

## 2019-11-04 MED ORDER — CHLORHEXIDINE GLUCONATE 4 % EX LIQD: 4.0000 "application " | Freq: Once | CUTANEOUS | Status: DC

## 2019-11-04 MED ORDER — ONDANSETRON HCL 4 MG/2ML IJ SOLN: 4.0000 mg | Freq: Four times a day (QID) | INTRAMUSCULAR | Status: DC | PRN

## 2019-11-04 MED ORDER — SODIUM CHLORIDE 0.9 % IV SOLN: INTRAVENOUS | Status: DC

## 2019-11-04 MED ORDER — CEFAZOLIN SODIUM-DEXTROSE 2-4 GM/100ML-% IV SOLN
2.0000 g | INTRAVENOUS | Status: AC
  Administered 2019-11-04: 2 g via INTRAVENOUS

## 2019-11-04 MED ORDER — MIDAZOLAM HCL 5 MG/5ML IJ SOLN
INTRAMUSCULAR | Status: DC | PRN
  Administered 2019-11-04 (×2): 2 mg via INTRAVENOUS

## 2019-11-04 MED ORDER — MIDAZOLAM HCL 5 MG/5ML IJ SOLN
INTRAMUSCULAR | Status: AC
  Filled 2019-11-04: qty 5

## 2019-11-04 SURGICAL SUPPLY — 7 items
CABLE SURGICAL S-101-97-12 (CABLE) ×2 IMPLANT
HEMOSTAT SURGICEL 2X4 FIBR (HEMOSTASIS) ×1 IMPLANT
ICD VISIA MRI DVFB1D1 (ICD Generator) ×1 IMPLANT
PAD PRO RADIOLUCENT 2001M-C (PAD) ×2 IMPLANT
POUCH AIGIS-R ANTIBACT ICD (Mesh General) ×2 IMPLANT
POUCH AIGIS-R ANTIBACT ICD LRG (Mesh General) IMPLANT
TRAY PACEMAKER INSERTION (PACKS) ×2 IMPLANT

## 2019-11-04 NOTE — Interval H&P Note (Signed)
ICD Criteria  Current LVEF:60%. Within 12 months prior to implant: Yes   Heart failure history: Yes, Class I  Cardiomyopathy history: Yes, Non-Ischemic Cardiomyopathy.  Atrial Fibrillation/Atrial Flutter: No.  Ventricular tachycardia history: Yes, Hemodynamic instability present. VT Type: Sustained Ventricular Tachycardia - Monomorphic and Polymorphic.  Cardiac arrest history: Yes, Ventricular Tachycardia.  History of syndromes with risk of sudden death: Yes, Other  Previous ICD: Yes, Reason for ICD:  Secondary prevention.  Current ICD indication: Secondary  PPM indication: No.  Class I or II Bradycardia indication present: No  Beta Blocker therapy for 3 or more months: Yes, prescribed.   Ace Inhibitor/ARB therapy for 3 or more months: No, medical reason.   I have seen [name] is a 44 y.o. malepre-procedural and has been ref for consideration of ICD implant for secondary prevention of sudden death.  The patient's chart has been reviewed and they meet criteria for ICD implant.  I have had a thorough discussion with the patient reviewing options.  The patient and their family (if available) have had opportunities to ask questions and have them answered. The patient and I have decided together through the [employer] to reimplant  ICD at this time.  Risks, benefits, alternatives to ICD implantation were discussed in detail with the patient today. The patient  understands that the risks include but are not limited to bleeding, infection, pneumothorax, perforation, tamponade, vascular damage, renal failure, MI, stroke, death, inappropriate shocks, and lead dislodgement and   wishes to proceed.  History and Physical Interval Note:  11/04/2019 7:40 AM  [name]  has presented today for surgery, with the diagnosis of eri.  The various methods of treatment have been discussed with the patient and family. After consideration of risks, benefits and  other options for treatment, the patient has consented to  Procedure(s): ICD [place] (N/A) as a surgical intervention.  The patient's history has been reviewed, patient examined, no change in status, stable for surgery.  I have reviewed the patient's chart and labs.  Questions were answered to the patient's satisfaction.     [name]

## 2019-11-04 NOTE — Discharge Instructions (Signed)
 Dermabond will come off in next 10-14 days; if not will remove at wound check  Keep wound dry until tomorrow evening  No Driving x 4 days  Wound check in office as scheduled  Pacemaker Battery Change, Care After This sheet gives you information about how to care for yourself after your procedure. Your health care provider may also give you more specific instructions. If you have problems or questions, contact your health care provider. What can I expect after the procedure? After your procedure, it is common to have:  Pain or soreness at the site where the pacemaker was inserted.  Swelling at the site where the pacemaker was inserted. Follow these instructions at home: Incision care   Keep the incision clean and dry. ? Do not take baths, swim, or use a hot tub until your health care provider approves. ? You may shower the day after your procedure, or as directed by your health care provider. ? Pat the area dry with a clean towel. Do not rub the area. This may cause bleeding.  Follow instructions from your health care provider about how to take care of your incision. Make sure you: ? Wash your hands with soap and water before you change your bandage (dressing). If soap and water are not available, use hand sanitizer. ? Change your dressing as told by your health care provider. ? Leave stitches (sutures), skin glue, or adhesive strips in place. These skin closures may need to stay in place for 2 weeks or longer. If adhesive strip edges start to loosen and curl up, you may trim the loose edges. Do not remove adhesive strips completely unless your health care provider tells you to do that.  Check your incision area every day for signs of infection. Check for: ? More redness, swelling, or pain. ? More fluid or blood. ? Warmth. ? Pus or a bad smell. Activity  Do not lift anything that is heavier than 10 lb (4.5 kg) until your health care provider says it is okay to do so.  For the  first 2 weeks, or as long as told by your health care provider: ? Avoid lifting your left arm higher than your shoulder. ? Be gentle when you move your arms over your head. It is okay to raise your arm to comb your hair. ? Avoid strenuous exercise.  Ask your health care provider when it is okay to: ? Resume your normal activities. ? Return to work or school. ? Resume sexual activity. Eating and drinking  Eat a heart-healthy diet. This should include plenty of fresh fruits and vegetables, whole grains, low-fat dairy products, and lean protein like chicken and fish.  Limit alcohol intake to no more than 1 drink a day for non-pregnant women and 2 drinks a day for men. One drink equals 12 oz of beer, 5 oz of wine, or 1 oz of hard liquor.  Check ingredients and nutrition facts on packaged foods and beverages. Avoid the following types of food: ? Food that is high in salt (sodium). ? Food that is high in saturated fat, like full-fat dairy or red meat. ? Food that is high in trans fat, like fried food. ? Food and drinks that are high in sugar. Lifestyle  Do not use any products that contain nicotine or tobacco, such as cigarettes and e-cigarettes. If you need help quitting, ask your health care provider.  Take steps to manage and control your weight.  Get regular exercise. Aim for 150   minutes of moderate-intensity exercise (such as walking or yoga) or 75 minutes of vigorous exercise (such as running or swimming) each week.  Manage other health problems, such as diabetes or high blood pressure. Ask your health care provider how you can manage these conditions. General instructions  Do not drive for 24 hours after your procedure if you were given a medicine to help you relax (sedative).  Take over-the-counter and prescription medicines only as told by your health care provider.  Avoid putting pressure on the area where the pacemaker was placed.  If you need an MRI after your pacemaker  has been placed, be sure to tell the health care provider who orders the MRI that you have a pacemaker.  Avoid close and prolonged exposure to electrical devices that have strong magnetic fields. These include: ? Cell phones. Avoid keeping them in a pocket near the pacemaker, and try using the ear opposite the pacemaker. ? MP3 players. ? Household appliances, like microwaves. ? Metal detectors. ? Electric generators. ? High-tension wires.  Keep all follow-up visits as directed by your health care provider. This is important. Contact a health care provider if:  You have pain at the incision site that is not relieved by over-the-counter or prescription medicines.  You have any of these around your incision site or coming from it: ? More redness, swelling, or pain. ? Fluid or blood. ? Warmth to the touch. ? Pus or a bad smell.  You have a fever.  You feel brief, occasional palpitations, light-headedness, or any symptoms that you think might be related to your heart. Get help right away if:  You experience chest pain that is different from the pain at the pacemaker site.  You develop a red streak that extends above or below the incision site.  You experience shortness of breath.  You have palpitations or an irregular heartbeat.  You have light-headedness that does not go away quickly.  You faint or have dizzy spells.  Your pulse suddenly drops or increases rapidly and does not return to normal.  You begin to gain weight and your legs and ankles swell. Summary  After your procedure, it is common to have pain, soreness, and some swelling where the pacemaker was inserted.  Make sure to keep your incision clean and dry. Follow instructions from your health care provider about how to take care of your incision.  Check your incision every day for signs of infection, such as more pain or swelling, pus or a bad smell, warmth, or leaking fluid and blood.  Avoid strenuous exercise  and lifting your left arm higher than your shoulder for 2 weeks, or as long as told by your health care provider. This information is not intended to replace advice given to you by your health care provider. Make sure you discuss any questions you have with your health care provider. Document Revised: 07/17/2017 Document Reviewed: 06/26/2016 Elsevier Patient Education  2020 Elsevier Inc.  

## 2019-11-04 NOTE — H&P (Signed)
Patient Care Team: [name], [name], MD as PCP - General (Family Medicine) Deboraha Sprang, MD as PCP - Cardiology (Cardiology)   HPI  [name] is a 44 y.o. male Seen in followup for VT in setting of cardiomyopathy-arrhythmogenic with prior catheter ablation  VT NS on sotalol  [facility]   The patient denies chest pain, shortness of breath, nocturnal dyspnea, orthopnea or peripheral edema.  There have been no palpitations, lightheadedness or syncope.    Records and Results Reviewed   Past Medical History:  Diagnosis Date  . AICD (automatic cardioverter/defibrillator) present 2007  . Anxiety   . 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 Asthma when younger  . History of chicken pox   . ICD (implantable cardiac defibrillator) in place   . Sleep apnea    uses cpap  . Ventricular tachycardia (HCC)    sotalol therapy;  catheter ablation at [facility] 10/10 and [facility] 2013    Past Surgical History:  Procedure Laterality Date  . CARDIAC CATHETERIZATION  06-01-06  . CARDIAC DEFIBRILLATOR PLACEMENT  06-03-06   Medtronic  . IMPLANTABLE CARDIOVERTER DEFIBRILLATOR GENERATOR CHANGE N/A 12/11/2011   Procedure: IMPLANTABLE CARDIOVERTER DEFIBRILLATOR GENERATOR CHANGE;  Surgeon: Deboraha Sprang, MD;  Location: [facility] CATH LAB;  Service: Cardiovascular;  Laterality: N/A;  . vt ablation  10/10, 10/13   x 2    Current Facility-Administered Medications  Medication Dose Route Frequency Provider Last Rate Last Admin  . 0.9 %  sodium chloride infusion   Intravenous Continuous Deboraha Sprang, MD 50 mL/hr at 11/04/19 [id number] New Bag at 11/04/19 [id number]  . ceFAZolin (ANCEF) IVPB 2g/100 mL premix  2 g Intravenous On Call Deboraha Sprang, MD      . chlorhexidine (HIBICLENS) 4 % liquid 4 application  4 application Topical Once Deboraha Sprang, MD      . gentamicin (GARAMYCIN) 80 mg in sodium chloride 0.9 % 500 mL irrigation  80 mg Irrigation On Call Deboraha Sprang,  MD        Allergies  Allergen Reactions  . Bee Venom Anaphylaxis  . Lidocaine     [name]      Social History   Tobacco Use  . Smoking status: Former Smoker    Packs/day: 1.00    Years: 18.00    Pack years: 18.00    Types: Cigarettes    Start date: 01/17/1990    Quit date: 08/19/2007    Years since quitting: 12.2  . Smokeless tobacco: Never Used  Substance Use Topics  . Alcohol use: No    Alcohol/week: 0.0 standard drinks  . Drug use: No     Family History  Problem Relation Age of Onset  . Sudden death Father   . Asthma Mother   . Diabetes Other   . Stroke Other   . Heart attack Other   . Prostate cancer Other   . Breast cancer Other   . Ovarian cancer Other   . Uterine cancer Other   . Colon cancer Other   . Drug abuse Other   . Depression Other   . Lung cancer Maternal Grandfather   . Heart disease Maternal Grandfather   . Heart disease Paternal Grandfather   . Hypertension Paternal Grandmother      Current Meds  Medication Sig  . Ascorbic Acid (VITAMIN C) 1000 MG tablet Take 1,000 mg by mouth at bedtime.  Marland Kitchen  Cholecalciferol (VITAMIN D3) 50 MCG (2000 UT) TABS Take 2,000 Units by mouth daily.  Marland Kitchen KLOR-CON M10 10 MEQ tablet TAKE 4 TABLETS BY MOUTH TWICE A DAY (Patient taking differently: Take 40 mEq by mouth in the morning and at bedtime. )  . magnesium oxide (MAG-OX) 400 MG tablet Take 1 tablet (400 mg total) by mouth 2 (two) times daily.  . metoprolol succinate (TOPROL-XL) 25 MG 24 hr tablet TAKE 1 TABLET BY MOUTH EVERY DAY AT BEDTIME (Patient taking differently: Take 25 mg by mouth at bedtime. )  . montelukast (SINGULAIR) 10 MG tablet Take 1 tablet (10 mg total) by mouth daily. (Patient taking differently: Take 10 mg by mouth at bedtime. )  . Multiple Vitamin (MULITIVITAMIN WITH MINERALS) TABS Take 1 tablet by mouth daily.  Marland Kitchen oxycodone-acetaminophen (PERCOCET) 2.5-325 MG tablet Take 1 tablet by mouth every 4 (four) hours as needed for pain.  . sotalol  (BETAPACE) 120 MG tablet TAKE 1 & 1/2 TABLETS BY MOUTH TWICE DAILY. (Patient taking differently: Take 180 mg by mouth in the morning and at bedtime. )  . tadalafil (CIALIS) 5 MG tablet Take 5 mg by mouth daily as needed for erectile dysfunction.  . vitamin E 180 MG (400 UNITS) capsule Take 400 Units by mouth at bedtime.  . Zinc 25 MG TABS Take 25 mg by mouth daily.     Review of Systems negative except from HPI and PMH  Physical Exam BP 121/73   Pulse (!) 57   Temp 97.9 F (36.6 C) (Oral)   Resp 17   Ht 5\' 10"  (1.778 m)   Wt 88.5 kg   SpO2 100%   BMI 27.98 kg/m  Well developed and well nourished in no acute distress HENT normal E scleral and icterus clear Neck Supple JVP flat; carotids brisk and full Clear to ausculation Device pocket well healed; without hematoma or erythema.  There is no tethering Regular rate and rhythm, no murmurs gallops or rub Soft with active bowel sounds No clubbing cyanosisEdema Alert and oriented, grossly normal motor and sensory function Skin Warm and Dry  ECG sinus 59 21/08/45 PVC neg lead 1  Assessment and  Plan ARVC-TMEM gene Positive  VT    ICD  at eri  High Risk Medication Surveillance  PTSD    We have reviewed the benefits and risks of generator replacement.  These include but are not limited to lead fracture and infection.  The patient understands, agrees and is willing to proceed.

## 2019-11-07 MED FILL — Lidocaine HCl Local Inj 1%: INTRAMUSCULAR | Qty: 60 | Status: AC

## 2019-11-07 MED FILL — Fentanyl Citrate Preservative Free (PF) Inj 100 MCG/2ML: INTRAMUSCULAR | Qty: 2 | Status: AC

## 2019-11-08 ENCOUNTER — Telehealth: Payer: Self-pay | Admitting: Internal Medicine

## 2019-11-08 NOTE — Telephone Encounter (Signed)
[name] is calling in regards to his previous procedure performed by Dr. Caryl Comes on 11/04/19. He states he was under the impression he would only be out of work for 7 days, but his summary sheet says 35. He would like a nurse to call him to go over this and also write him a work note in regards to it. Please advise.

## 2019-11-09 NOTE — Telephone Encounter (Signed)
Spoke with pt and advised per Dr Caryl Comes he may return to work but will need to be on lifting restrictions of no more than 20 pounds x 2 weeks.  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  Pt verbalizes understanding and agrees with current plan.

## 2019-11-17 ENCOUNTER — Other Ambulatory Visit: Payer: Self-pay

## 2019-11-17 ENCOUNTER — Telehealth (INDEPENDENT_AMBULATORY_CARE_PROVIDER_SITE_OTHER): Payer: BC Managed Care – PPO | Admitting: *Deleted

## 2019-11-17 DIAGNOSIS — I472 Ventricular tachycardia, unspecified: Secondary | ICD-10-CM

## 2019-11-17 DIAGNOSIS — Z95810 Presence of automatic (implantable) cardiac defibrillator: Secondary | ICD-10-CM

## 2019-11-17 DIAGNOSIS — I428 Other cardiomyopathies: Secondary | ICD-10-CM

## 2019-11-17 NOTE — Progress Notes (Signed)
Patient verbally consented to ICD wound check via virtual visit due to COVID-19.  ICD wound check via video visit. Incision edges fully approximated, no evidence of drainage, redness, or swelling. Patient denies fever or chills. Reviewed signs/symptoms of infection; patient aware to call if any noted. Patient education provided regarding wound care and post-procedure activity progression (no restrictions s/p generator replacement). Patient agrees to send manual transmission for review of ICD function this evening (currently at work).    Update 11/18/19-- Manual transmission received 11/17/19. Normal ICD function. Presenting rhythm VS 70s. Lead trends stable. RV output at chronic settings s/p generator replacement. 1 NSVT episode, 10 beats duration, avg V 171bpm. No AF episodes. [facility] on 02/06/20 and ROV with Dr. Caryl Comes on 02/07/20.

## 2019-11-18 ENCOUNTER — Encounter: Payer: Self-pay | Admitting: *Deleted

## 2019-11-18 LAB — CUP PACEART REMOTE DEVICE CHECK
Battery Remaining Longevity: 137 mo
Battery Voltage: 3.16 V
Brady Statistic RV Percent Paced: 0.41 %
Date Time Interrogation Session: 20210401205553
HighPow Impedance: 53 Ohm
HighPow Impedance: 88 Ohm
Implantable Lead Implant Date: 20071018
Implantable Lead Location: 753860
Implantable Lead Model: 6947
Implantable Pulse Generator Implant Date: 20210319
Lead Channel Impedance Value: 361 Ohm
Lead Channel Impedance Value: 456 Ohm
Lead Channel Pacing Threshold Amplitude: 0.75 V
Lead Channel Pacing Threshold Pulse Width: 0.4 ms
Lead Channel Sensing Intrinsic Amplitude: 14 mV
Lead Channel Sensing Intrinsic Amplitude: 14 mV
Lead Channel Setting Pacing Amplitude: 2 V
Lead Channel Setting Pacing Pulse Width: 0.4 ms
Lead Channel Setting Sensing Sensitivity: 1.2 mV

## 2019-11-24 NOTE — Addendum Note (Signed)
Addended by: Douglass Rivers D on: 11/24/2019 08:51 AM   Modules accepted: Level of Service

## 2020-01-04 ENCOUNTER — Other Ambulatory Visit: Payer: Self-pay

## 2020-01-04 ENCOUNTER — Encounter: Payer: Self-pay | Admitting: Pulmonary Disease

## 2020-01-04 ENCOUNTER — Ambulatory Visit (INDEPENDENT_AMBULATORY_CARE_PROVIDER_SITE_OTHER): Payer: BC Managed Care – PPO | Admitting: Pulmonary Disease

## 2020-01-04 VITALS — BP 98/58 | HR 65 | Temp 98.5°F | Ht 70.0 in | Wt 202.8 lb

## 2020-01-04 DIAGNOSIS — Z9989 Dependence on other enabling machines and devices: Secondary | ICD-10-CM | POA: Diagnosis not present

## 2020-01-04 DIAGNOSIS — G4733 Obstructive sleep apnea (adult) (pediatric): Secondary | ICD-10-CM

## 2020-01-04 NOTE — Patient Instructions (Signed)
Follow up in 1 year.

## 2020-01-04 NOTE — Progress Notes (Signed)
[facility]  Chief Complaint  Patient presents with  . Follow-up    Constitutional:  BP (!) 98/58 (BP Location: Right Arm, Cuff Size: Normal)   Pulse 65   Temp 98.5 F (36.9 C) (Temporal)   Ht 5\' 10"  (1.778 m)   Wt 202 lb 12.8 oz (92 kg)   SpO2 98% Comment: RA  BMI 29.10 kg/m   Past Medical History:  Anxiety, s/p AICD, Asthma, VT  Summary:  [name] is a 44 y.o. male with obstructive sleep apnea.  He works as a Public house manager.  Subjective:   Uses CPAP nightly.  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  Has nasal mask.  Denies sinus congestion, sore throat, dry mouth, aerophagia.  Got COVID vaccine.  Noted to have teeth grinding and might need to wear a mouthguard.   Physical Exam:   Appearance - well kempt  ENMT - no sinus tenderness, no nasal discharge, no oral exudate, Mallampati 4  Respiratory - no wheeze, or rales  CV - regular rate and rhythm, no murmurs  Ext - no edema  Psych - normal mood and affect   Assessment/Plan:   Obstructive sleep apnea. - he is compliant with CPAP and reports benefit from therapy - continue CPAP 7 cm H2O  Daytime fatigue. - likely related to shift work  Bruxism. - he will f/u with his dentist about getting a mouthguard  A total of 21 minutes addressing patient care on the day of the visit.  Follow up:  Patient Instructions  Follow up in 1 year   Signature:  Chesley Mires, MD [place] Pager: [phone] 01/04/2020, 12:09 PM  Flow Sheet    Sleep tests:  PSG 12/03/11 >> AHI 22, SpO2 low 89% Auto CPAP 12/03/19 to 01/01/20 >> used on 29 of 30 nights with average 7 hrs 6 min.  Average AHI 3.1 with CPAP 7 cm H2O.  Cardiac tests:  Echo 06/06/19 >> EF 55 to 60%  Medications:   Allergies as of 01/04/2020      Reactions   Bee Venom Anaphylaxis   Lidocaine    [name]      Medication List       Accurate as of Jan 04, 2020 12:08 PM. If you have any questions, ask your nurse or doctor.        fexofenadine 180 MG tablet Commonly known as: ALLEGRA Take 180 mg by mouth daily as needed (allergies.).   fluticasone 50 MCG/ACT nasal spray Commonly known as: FLONASE PLACE 2 SPRAYS INTO THE NOSE DAILY AS NEEDED. What changed:   how much to take  how to take this  when to take this  reasons to take this  additional instructions   Klor-Con M10 10 MEQ tablet Generic drug: potassium chloride TAKE 4 TABLETS BY MOUTH TWICE A DAY What changed: See the new instructions.   magnesium oxide 400 MG tablet Commonly known as: MAG-OX Take 1 tablet (400 mg total) by mouth 2 (two) times daily.   metoprolol succinate 25 MG 24 hr tablet Commonly known as: TOPROL-XL TAKE 1 TABLET BY MOUTH EVERY DAY AT BEDTIME   montelukast 10 MG tablet Commonly known as: SINGULAIR Take 1 tablet (10 mg total) by mouth daily. What changed: when to take this   multivitamin with minerals Tabs tablet Take 1 tablet by mouth daily.   oxycodone-acetaminophen 2.5-325 MG tablet Commonly known as: PERCOCET Take  1 tablet by mouth every 4 (four) hours as needed for pain.   sotalol 120 MG tablet Commonly known as: BETAPACE TAKE 1 & 1/2 TABLETS BY MOUTH TWICE DAILY. What changed: See the new instructions.   tadalafil 5 MG tablet Commonly known as: CIALIS Take 5 mg by mouth daily as needed for erectile dysfunction.   vitamin C 1000 MG tablet Take 1,000 mg by mouth at bedtime.   Vitamin D3 50 MCG (2000 UT) Tabs Take 2,000 Units by mouth daily.   vitamin E 180 MG (400 UNITS) capsule Take 400 Units by mouth at bedtime.   Zinc 25 MG Tabs Take 25 mg by mouth daily.       Past Surgical History:  He  has a past surgical history that includes Cardiac catheterization (06-01-06); Cardiac defibrillator placement (06-03-06); vt ablation (10/10, 10/13); implantable cardioverter defibrillator generator change (N/A, 12/11/2011); and  ICD GENERATOR CHANGEOUT (N/A, 11/04/2019).  Family History:  His family history includes Asthma in his mother; Breast cancer in an other family member; Colon cancer in an other family member; Depression in an other family member; Diabetes in an other family member; Drug abuse in an other family member; Heart attack in an other family member; Heart disease in his maternal grandfather and paternal grandfather; Hypertension in his paternal grandmother; Lung cancer in his maternal grandfather; Ovarian cancer in an other family member; Prostate cancer in an other family member; Stroke in an other family member; Sudden death in his father; Uterine cancer in an other family member.  Social History:  He  reports that he quit smoking about 12 years ago. His smoking use included cigarettes. He started smoking about 29 years ago. He has a 18.00 pack-year smoking history. He has never used smokeless tobacco. He reports that he does not drink alcohol or use drugs.

## 2020-01-05 ENCOUNTER — Telehealth: Payer: Self-pay | Admitting: Podiatry

## 2020-01-05 NOTE — Telephone Encounter (Signed)
Pt left message inquiring about another pair of orthotics or refurbishing current ones. Just needing additional information.  I returned call and left message for pt that if he wants new ones he would have to see the doctor for [place] to file insurance. Also included that his insurance([employer]) did change this year and covered after deductible @ 70% or 80% just depends on pts plan.(70/30 or 80/20).  If he wanted to get his refurbished he could just drop them off at our office and pay the 90.00 fee and I would call when they come in to pick them up.

## 2020-01-09 NOTE — Telephone Encounter (Signed)
Pt returned my call and left message that he has met his out of pocket for the year already. So he wants to take advantage of that.   I returned call and left message for pt to call to schedule an appt so that we can bill insurance to get an updated rx  since he was last seen 2019.

## 2020-02-06 ENCOUNTER — Ambulatory Visit (INDEPENDENT_AMBULATORY_CARE_PROVIDER_SITE_OTHER): Payer: BC Managed Care – PPO | Admitting: *Deleted

## 2020-02-06 DIAGNOSIS — I472 Ventricular tachycardia, unspecified: Secondary | ICD-10-CM

## 2020-02-06 LAB — CUP PACEART REMOTE DEVICE CHECK
Battery Remaining Longevity: 136 mo
Battery Voltage: 3.15 V
Brady Statistic RV Percent Paced: 0.8 %
Date Time Interrogation Session: 20210621022603
HighPow Impedance: 48 Ohm
HighPow Impedance: 72 Ohm
Implantable Lead Implant Date: 20071018
Implantable Lead Location: 753860
Implantable Lead Model: 6947
Implantable Pulse Generator Implant Date: 20210319
Lead Channel Impedance Value: 361 Ohm
Lead Channel Impedance Value: 399 Ohm
Lead Channel Pacing Threshold Amplitude: 0.75 V
Lead Channel Pacing Threshold Pulse Width: 0.4 ms
Lead Channel Sensing Intrinsic Amplitude: 14.125 mV
Lead Channel Sensing Intrinsic Amplitude: 14.125 mV
Lead Channel Setting Pacing Amplitude: 2 V
Lead Channel Setting Pacing Pulse Width: 0.4 ms
Lead Channel Setting Sensing Sensitivity: 1.2 mV

## 2020-02-07 ENCOUNTER — Encounter: Payer: Self-pay | Admitting: Internal Medicine

## 2020-02-07 ENCOUNTER — Ambulatory Visit (INDEPENDENT_AMBULATORY_CARE_PROVIDER_SITE_OTHER): Payer: BC Managed Care – PPO | Admitting: Internal Medicine

## 2020-02-07 ENCOUNTER — Other Ambulatory Visit: Payer: Self-pay

## 2020-02-07 VITALS — BP 114/62 | HR 65 | Ht 69.5 in | Wt 203.0 lb

## 2020-02-07 DIAGNOSIS — I472 Ventricular tachycardia, unspecified: Secondary | ICD-10-CM

## 2020-02-07 DIAGNOSIS — I428 Other cardiomyopathies: Secondary | ICD-10-CM | POA: Diagnosis not present

## 2020-02-07 DIAGNOSIS — Z95810 Presence of automatic (implantable) cardiac defibrillator: Secondary | ICD-10-CM | POA: Diagnosis not present

## 2020-02-07 NOTE — Progress Notes (Signed)
[name] Patient Care Team: [name], [name], MD as PCP - General (Family Medicine) Deboraha Sprang, MD as PCP - Cardiology (Cardiology)   HPI  [name] is a 44 y.o. male Seen in followup for ventricular tachycardia in the setting of gene positive arrhythmogenic cardiomyopathy (status post ICD implantation with can change 3/21.   He is status post catheter ablation [facility] ).    Doing well post change out   little bit of discomfort that he had not anticipated.  Now resolved.  The patient denies chest pain, shortness of breath, nocturnal dyspnea, orthopnea or peripheral edema.  There have been no palpitations, lightheadedness or syncope.     He and his wife had a miscarriage.  Date Cr K Mg Hgb  3/21 0.91 4.3  13.3             Past Medical History:  Diagnosis Date  . AICD (automatic cardioverter/defibrillator) present 2007  . Anxiety   . 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 Asthma when younger  . History of chicken pox   . ICD (implantable cardiac defibrillator) in place   . Sleep apnea    uses cpap  . Ventricular tachycardia (HCC)    sotalol therapy;  catheter ablation at [facility] 10/10 and [facility] 2013    Past Surgical History:  Procedure Laterality Date  . CARDIAC CATHETERIZATION  06-01-06  . CARDIAC DEFIBRILLATOR PLACEMENT  06-03-06   Medtronic  . ICD GENERATOR CHANGEOUT N/A 11/04/2019   Procedure: ICD GENERATOR CHANGEOUT;  Surgeon: Deboraha Sprang, MD;  Location: [place] [facility];  Service: Cardiovascular;  Laterality: N/A;  . IMPLANTABLE CARDIOVERTER DEFIBRILLATOR GENERATOR CHANGE N/A 12/11/2011   Procedure: IMPLANTABLE CARDIOVERTER DEFIBRILLATOR GENERATOR CHANGE;  Surgeon: Deboraha Sprang, MD;  Location[facility] CATH LAB;  Service: Cardiovascular;  Laterality: N/A;  . vt ablation  10/10, 10/13   x 2    Current Outpatient Medications  Medication Sig Dispense Refill  . Ascorbic Acid (VITAMIN C) 1000 MG tablet Take 1,000 mg by mouth at  bedtime.    . Cetirizine HCl (ZYRTEC ALLERGY PO) Take 1 tablet by mouth as needed (allergies).    . Cholecalciferol (VITAMIN D3) 50 MCG (2000 UT) TABS Take 2,000 Units by mouth daily.    . fexofenadine (ALLEGRA) 180 MG tablet Take 180 mg by mouth daily as needed (allergies.).     Marland Kitchen fluticasone (FLONASE) 50 MCG/ACT nasal spray PLACE 2 SPRAYS INTO THE NOSE DAILY AS NEEDED. 16 g 11  . KLOR-CON M10 10 MEQ tablet TAKE 4 TABLETS BY MOUTH TWICE A DAY 720 tablet 3  . magnesium oxide (MAG-OX) 400 MG tablet Take 1 tablet (400 mg total) by mouth 2 (two) times daily. 180 tablet 3  . metoprolol succinate (TOPROL-XL) 25 MG 24 hr tablet TAKE 1 TABLET BY MOUTH EVERY DAY AT BEDTIME 90 tablet 3  . montelukast (SINGULAIR) 10 MG tablet Take 1 tablet (10 mg total) by mouth daily. 90 tablet 3  . Multiple Vitamin (MULITIVITAMIN WITH MINERALS) TABS Take 1 tablet by mouth daily.    . sotalol (BETAPACE) 120 MG tablet TAKE 1 & 1/2 TABLETS BY MOUTH TWICE DAILY. 270 tablet 3  . tadalafil (CIALIS) 5 MG tablet Take 5 mg by mouth daily as needed for erectile dysfunction.    . vitamin E 180 MG (400 UNITS) capsule Take 400 Units by mouth at bedtime.    . Zinc 25 MG TABS Take 25 mg by  mouth daily.     No current facility-administered medications for this visit.    Allergies  Allergen Reactions  . Bee Venom Anaphylaxis  . Lidocaine     [name]    Review of Systems negative except from HPI and PMH  Physical Exam BP 114/62   Pulse 65   Ht 5' 9.5" (1.765 m)   Wt 203 lb (92.1 kg)   SpO2 97%   BMI 29.55 kg/m  Well developed and well nourished in no acute distress HENT normal Neck supple with JVP-flat Clear Device pocket well healed; without hematoma or erythema.  There is no tethering  Regular rate and rhythm, no  murmur Abd-soft with active BS No Clubbing cyanosis  edema Skin-warm and dry A & Oriented  Grossly normal sensory and motor function    ECG  Sinus @ 65 21/11/46 PVCs freq  Assessment and   Plan  ARVC-TMEM gene Positive  VT    ICD  The patient's device was interrogated.  The information was reviewed. No changes were made in the programming.     High Risk Medication Surveillance  PTSD    No intercurrent Ventricular tachycardia  Need surveillance labs for sotalol

## 2020-02-07 NOTE — Patient Instructions (Signed)
Medication Instructions:  Your physician recommends that you continue on your current medications as directed. Please refer to the Current Medication list given to you today.  Labwork: None ordered.  Testing/Procedures: None ordered.  Follow-Up: Your physician wants you to follow-up [id number], 04/30/2020 at [facility] will receive a reminder letter in the mail two months in advance. If you don't receive a letter, please call our office to schedule the follow-up appointment.  Remote monitoring is used to monitor your Pacemaker of ICD from home. This monitoring reduces the number of office visits required to check your device to one time per year. It allows [place] to keep an eye on the functioning of your device to ensure it is working properly.   Any Other Special Instructions Will Be Listed Below (If Applicable).  If you need a refill on your cardiac medications before your next appointment, please call your pharmacy.

## 2020-02-07 NOTE — Progress Notes (Signed)
Remote ICD transmission.   

## 2020-04-28 DIAGNOSIS — I472 Ventricular tachycardia, unspecified: Secondary | ICD-10-CM | POA: Insufficient documentation

## 2020-04-30 ENCOUNTER — Encounter: Payer: Self-pay | Admitting: Internal Medicine

## 2020-04-30 ENCOUNTER — Other Ambulatory Visit: Payer: Self-pay

## 2020-04-30 ENCOUNTER — Ambulatory Visit: Payer: BC Managed Care – PPO | Admitting: Internal Medicine

## 2020-04-30 VITALS — BP 114/72 | HR 55 | Ht 69.0 in | Wt 200.4 lb

## 2020-04-30 DIAGNOSIS — I472 Ventricular tachycardia, unspecified: Secondary | ICD-10-CM

## 2020-04-30 DIAGNOSIS — Z79899 Other long term (current) drug therapy: Secondary | ICD-10-CM

## 2020-04-30 DIAGNOSIS — I428 Other cardiomyopathies: Secondary | ICD-10-CM | POA: Diagnosis not present

## 2020-04-30 DIAGNOSIS — Z95810 Presence of automatic (implantable) cardiac defibrillator: Secondary | ICD-10-CM | POA: Diagnosis not present

## 2020-04-30 NOTE — Progress Notes (Signed)
[name] Patient Care Team: [name], [name], MD as PCP - General (Family Medicine) Deboraha Sprang, MD as PCP - Cardiology (Cardiology)   HPI  [name] is a 44 y.o. 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   He is status post catheter ablation [facility] ).    The patient denies chest pain, shortness of breath, nocturnal dyspnea, orthopnea or peripheral edema.  There have been no palpitations, lightheadedness or syncope.  Does complain of fatigue.    He and his wife had a miscarriage. Allene Dillon is now 64 months old.  He says she is not responsible for his fatigue.  Date Cr K Mg Hgb  3/21 0.91 4.3  13.3             Past Medical History:  Diagnosis Date  . AICD (automatic cardioverter/defibrillator) present 2007  . Anxiety   . 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 Asthma when younger  . History of chicken pox   . ICD (implantable cardiac defibrillator) in place   . Sleep apnea    uses cpap  . Ventricular tachycardia (HCC)    sotalol therapy;  catheter [facility] at [facility] 10/10 and [facility] 2013    Past Surgical History:  Procedure Laterality Date  . CARDIAC CATHETERIZATION  06-01-06  . CARDIAC DEFIBRILLATOR PLACEMENT  06-03-06   Medtronic  . ICD GENERATOR CHANGEOUT N/A 11/04/2019   Procedure: ICD GENERATOR CHANGEOUT;  Surgeon: Deboraha Sprang, MD;  Location: [place] CV [facility];  Service: Cardiovascular;  Laterality: N/A;  . IMPLANTABLE CARDIOVERTER DEFIBRILLATOR GENERATOR CHANGE N/A 12/11/2011   Procedure: IMPLANTABLE CARDIOVERTER DEFIBRILLATOR GENERATOR CHANGE;  Surgeon: Deboraha Sprang, MD;  Location: [facility] CATH LAB;  Service: Cardiovascular;  Laterality: N/A;  . vt ablation  10/10, 10/13   x 2    Current Outpatient Medications  Medication Sig Dispense Refill  . Ascorbic Acid (VITAMIN C) 1000 MG tablet Take 1,000 mg by  mouth at bedtime.    . Cetirizine HCl (ZYRTEC ALLERGY PO) Take 1 tablet by mouth as needed (allergies).    . Cholecalciferol (VITAMIN D3) 50 MCG (2000 UT) TABS Take 2,000 Units by mouth daily.    . fexofenadine (ALLEGRA) 180 MG tablet Take 180 mg by mouth daily as needed (allergies.).     Marland Kitchen fluticasone (FLONASE) 50 MCG/ACT nasal spray PLACE 2 SPRAYS INTO THE NOSE DAILY AS NEEDED. 16 g 11  . KLOR-CON M10 10 MEQ tablet TAKE 4 TABLETS BY MOUTH TWICE A DAY 720 tablet 3  . magnesium oxide (MAG-OX) 400 MG tablet Take 1 tablet (400 mg total) by mouth 2 (two) times daily. 180 tablet 3  . metoprolol succinate (TOPROL-XL) 25 MG 24 hr tablet TAKE 1 TABLET BY MOUTH EVERY DAY AT BEDTIME 90 tablet 3  . montelukast (SINGULAIR) 10 MG tablet Take 1 tablet (10 mg total) by mouth daily. 90 tablet 3  . Multiple Vitamin (MULITIVITAMIN WITH MINERALS) TABS Take 1 tablet by mouth daily.    . sotalol (BETAPACE) 120 MG tablet TAKE 1 & 1/2 TABLETS BY MOUTH TWICE DAILY. 270 tablet 3  . SOTALOL AF 120 MG TABS Take 1.5 tablets by mouth 2 (two) times daily.    . tadalafil (CIALIS) 5 MG tablet Take 5 mg by mouth daily as needed for erectile dysfunction.    . vitamin E 180 MG (400 UNITS) capsule Take  400 Units by mouth at bedtime.    . Zinc 25 MG TABS Take 25 mg by mouth daily.     No current facility-administered medications for this visit.    Allergies  Allergen Reactions  . Bee Venom Anaphylaxis  . Lidocaine     [name]    Review of Systems negative except from HPI and PMH  Physical Exam BP 114/72   Pulse (!) 55   Ht 5\' 9"  (1.753 m)   Wt 200 lb 6.4 oz (90.9 kg)   SpO2 98%   BMI 29.59 kg/m  Well developed and well nourished in no acute distress HENT normal Neck supple with JVP-flat Clear Device pocket well healed; without hematoma or erythema.  There is no tethering  Regular rate and rhythm, no   murmur Abd-soft with active BS No Clubbing cyanosis  edema Skin-warm and dry A & Oriented  Grossly  normal sensory and motor function  ECG sinus at 55 Intervals 22/11/46  Assessment and  Plan  ARVC-TMEM gene Positive  VT    ICD   Medtronic   Sinus bradycardia  High Risk Medication Surveillance-sotalol  PTSD    Fatigue    No intercurrent VT  Needs surveillance laboratories for sotalol.  QT is within range.  Fatigue may be related to his bradycardia.  May be related to his metoprolol.  Overall mental health is doing quite well.  We will have him hold his metoprolol for 2 weeks.  Somewhat tenuously given that his VT has been quiet

## 2020-04-30 NOTE — Patient Instructions (Signed)
Medication Instructions:  Your physician has recommended you make the following change in your medication:   Hold Metoprolol and contact Dr Caryl Comes via [place] in 2 weeks to let us know if fatigue has improved.  *If you need a refill on your cardiac medications before your next appointment, please call your pharmacy*   Lab Work: BMP and Mg today  If you have labs (blood work) drawn today and your tests are completely normal, you will receive your results only by: Marland Kitchen MyChart Message (if you have MyChart) OR . A paper copy in the mail If you have any lab test that is abnormal or we need to change your treatment, we will call you to review the results.   Testing/Procedures: None ordered.    Follow-Up: At [facility], you and your health needs are our priority.  As part of our continuing mission to provide you with exceptional heart care, we have created designated Provider Care Teams.  These Care Teams include your primary Cardiologist (physician) and Advanced Practice Providers (APPs -  Physician Assistants and Nurse Practitioners) who all work together to provide you with the care you need, when you need it.  We recommend signing up for the patient portal called "MyChart".  Sign up information is provided on this After Visit Summary.  MyChart is used to connect with patients for Virtual Visits (Telemedicine).  Patients are able to view lab/test results, encounter notes, upcoming appointments, etc.  Non-urgent messages can be sent to your provider as well.   To learn more about what you can do with MyChart, go to [email or web address].    Your next appointment:   6 month(s)  The format for your next appointment:   In Person  Provider:   [name], MD

## 2020-05-01 LAB — CUP PACEART INCLINIC DEVICE CHECK
Battery Remaining Longevity: 135 mo
Battery Voltage: 3.1 V
Brady Statistic RV Percent Paced: 0.75 %
Date Time Interrogation Session: 20210913160200
HighPow Impedance: 53 Ohm
HighPow Impedance: 81 Ohm
Implantable Lead Implant Date: 20071018
Implantable Lead Location: 753860
Implantable Lead Model: 6947
Implantable Pulse Generator Implant Date: 20210319
Lead Channel Impedance Value: 342 Ohm
Lead Channel Impedance Value: 399 Ohm
Lead Channel Pacing Threshold Amplitude: 0.625 V
Lead Channel Pacing Threshold Pulse Width: 0.4 ms
Lead Channel Sensing Intrinsic Amplitude: 14.375 mV
Lead Channel Setting Pacing Amplitude: 2 V
Lead Channel Setting Pacing Pulse Width: 0.4 ms
Lead Channel Setting Sensing Sensitivity: 1.2 mV

## 2020-05-01 LAB — BASIC METABOLIC PANEL
BUN/Creatinine Ratio: 12 (ref 9–20)
BUN: 11 mg/dL (ref 6–24)
CO2: 26 mmol/L (ref 20–29)
Calcium: 9.5 mg/dL (ref 8.7–10.2)
Chloride: 102 mmol/L (ref 96–106)
Creatinine, Ser: 0.89 mg/dL (ref 0.76–1.27)
GFR calc Af Amer: 122 mL/min/{1.73_m2} (ref >59)
GFR calc non Af Amer: 105 mL/min/{1.73_m2} (ref >59)
Glucose: 87 mg/dL (ref 65–99)
Potassium: 4.1 mmol/L (ref 3.5–5.2)
Sodium: 140 mmol/L (ref 134–144)

## 2020-05-01 LAB — MAGNESIUM: Magnesium: 2.1 mg/dL (ref 1.6–2.3)

## 2020-05-07 ENCOUNTER — Ambulatory Visit (INDEPENDENT_AMBULATORY_CARE_PROVIDER_SITE_OTHER): Payer: BC Managed Care – PPO | Admitting: *Deleted

## 2020-05-07 DIAGNOSIS — I472 Ventricular tachycardia, unspecified: Secondary | ICD-10-CM

## 2020-05-08 LAB — CUP PACEART REMOTE DEVICE CHECK
Battery Remaining Longevity: 135 mo
Battery Voltage: 3.1 V
Brady Statistic RV Percent Paced: 0.67 %
Date Time Interrogation Session: 20210920102504
HighPow Impedance: 56 Ohm
HighPow Impedance: 91 Ohm
Implantable Lead Implant Date: 20071018
Implantable Lead Location: 753860
Implantable Lead Model: 6947
Implantable Pulse Generator Implant Date: 20210319
Lead Channel Impedance Value: 342 Ohm
Lead Channel Impedance Value: 456 Ohm
Lead Channel Pacing Threshold Amplitude: 0.625 V
Lead Channel Pacing Threshold Pulse Width: 0.4 ms
Lead Channel Sensing Intrinsic Amplitude: 15.125 mV
Lead Channel Sensing Intrinsic Amplitude: 15.125 mV
Lead Channel Setting Pacing Amplitude: 2 V
Lead Channel Setting Pacing Pulse Width: 0.4 ms
Lead Channel Setting Sensing Sensitivity: 1.2 mV

## 2020-05-09 ENCOUNTER — Ambulatory Visit (INDEPENDENT_AMBULATORY_CARE_PROVIDER_SITE_OTHER): Payer: BC Managed Care – PPO | Admitting: Family Medicine

## 2020-05-09 ENCOUNTER — Other Ambulatory Visit: Payer: Self-pay

## 2020-05-09 ENCOUNTER — Telehealth: Payer: Self-pay

## 2020-05-09 VITALS — BP 108/78 | HR 57 | Temp 98.3°F | Wt 200.0 lb

## 2020-05-09 DIAGNOSIS — Z23 Encounter for immunization: Secondary | ICD-10-CM

## 2020-05-09 DIAGNOSIS — R1032 Left lower quadrant pain: Secondary | ICD-10-CM | POA: Diagnosis not present

## 2020-05-09 LAB — CBC WITH DIFFERENTIAL/PLATELET
Basophils Absolute: 0.1 10*3/uL (ref 0.0–0.1)
Basophils Relative: 0.6 % (ref 0.0–3.0)
Eosinophils Absolute: 0.1 10*3/uL (ref 0.0–0.7)
Eosinophils Relative: 1.5 % (ref 0.0–5.0)
HCT: 40.8 % (ref 39.0–52.0)
Hemoglobin: 13.9 g/dL (ref 13.0–17.0)
Lymphocytes Relative: 40.4 % (ref 12.0–46.0)
Lymphs Abs: 3.2 10*3/uL (ref 0.7–4.0)
MCHC: 34.1 g/dL (ref 30.0–36.0)
MCV: 88.6 fl (ref 78.0–100.0)
Monocytes Absolute: 0.8 10*3/uL (ref 0.1–1.0)
Monocytes Relative: 9.8 % (ref 3.0–12.0)
Neutro Abs: 3.7 10*3/uL (ref 1.4–7.7)
Neutrophils Relative %: 47.7 % (ref 43.0–77.0)
Platelets: 204 10*3/uL (ref 150.0–400.0)
RBC: 4.6 Mil/uL (ref 4.22–5.81)
RDW: 13 % (ref 11.5–15.5)
WBC: 7.8 10*3/uL (ref 4.0–10.5)

## 2020-05-09 LAB — POCT URINALYSIS DIPSTICK
Bilirubin, UA: NEGATIVE
Blood, UA: NEGATIVE
Glucose, UA: NEGATIVE
Ketones, UA: NEGATIVE
Leukocytes, UA: NEGATIVE
Nitrite, UA: NEGATIVE
Protein, UA: NEGATIVE
Spec Grav, UA: 1.015 (ref 1.010–1.025)
Urobilinogen, UA: 0.2 E.U./dL
pH, UA: 8 (ref 5.0–8.0)

## 2020-05-09 LAB — HEPATIC FUNCTION PANEL
ALT: 30 U/L (ref 0–53)
AST: 23 U/L (ref 0–37)
Albumin: 4.7 g/dL (ref 3.5–5.2)
Alkaline Phosphatase: 42 U/L (ref 39–117)
Bilirubin, Direct: 0.1 mg/dL (ref 0.0–0.3)
Total Bilirubin: 0.6 mg/dL (ref 0.2–1.2)
Total Protein: 7.1 g/dL (ref 6.0–8.3)

## 2020-05-09 NOTE — Telephone Encounter (Signed)
[name] - Client TELEPHONE ADVICE RECORD [facility] Patient Name: [name] Gender: Male DOB: April 03, 1976 Age: 44 Y 51 M 13 D Return Phone Number: [phone] (Primary) Address: City/State/Zip: [place] [address] Client [facility] - Client Client Site [place] MD Contact Type Call Who Is Calling Patient / Member / Family / Caregiver Call Type Triage / Clinical Relationship To Patient Self Return Phone Number [phone] (Primary) Chief Complaint Abdominal Pain Reason for Call Symptomatic / Request for Health Information Initial Comment Transferred from answering service. PT is having abdominal pain and left side abdominal pain. Translation No Nurse Assessment Nurse: Hassell Done, RN, Melanie Date/Time (Eastern Time): 05/08/2020 3:26:51 PM Confirm and document reason for call. If symptomatic, describe symptoms. ---Caller has abdominal pain on the L side that is dull and constant. At the side of his belly button and below. Has had the pain for 4-6 weeks. Had a bad kidney stone last year. Has the patient had close contact with a person known or suspected to have the novel coronavirus illness OR traveled / lives in area with major community spread (including international travel) in the last 14 days from the onset of symptoms? * If Asymptomatic, screen for exposure and travel within the last 14 days. ---No Does the patient have any new or worsening symptoms? ---Yes Will a triage be completed? ---Yes Related visit to physician within the last 2 weeks? ---No Does the PT have any chronic conditions? (i.e. diabetes, asthma, this includes High risk factors for pregnancy, etc.) ---Yes List chronic conditions. ---heart problems Is this a behavioral health or substance abuse call? ---No Guidelines Guideline Title Affirmed Question Affirmed Notes Nurse Date/Time  (Eastern Time) Abdominal Pain - Male [1] MILD pain (e.g., does not interfere with normal activities) AND [2] pain comes and goes (cramps) [3] present > 48 hours (Exception: this same abdominal pain Arnaldo Natal 05/08/2020 3:28:59 PM PLEASE NOTE: All timestamps contained within this report are represented as [place] Standard Time. CONFIDENTIALTY NOTICE: This fax transmission is intended only for the addressee. It contains information that is legally privileged, confidential or otherwise protected from use or disclosure. If you are not the intended recipient, you are strictly prohibited from reviewing, disclosing, copying using or disseminating any of this information or taking any action in reliance on or regarding this information. If you have received this fax in error, please notify us immediately by telephone so that we can arrange for its return to [place]. Phone: [phone], Toll-Free: [phone], Fax: [phone] Page: 2 of 2 Call Id: [id number] Guidelines Guideline Title Affirmed Question Affirmed Notes Nurse Date/Time Eilene Ghazi Time) is a chronic symptom recurrent or ongoing AND present > 4 weeks) Disp. Time Eilene Ghazi Time) Disposition Final User 05/08/2020 3:05:52 PM Attempt made - message left Arnaldo Natal 05/08/2020 3:33:01 PM See PCP within 24 Hours Yes Hassell Done, RN, Threasa Beards Caller Disagree/Comply Comply Caller Understands Yes PreDisposition Call Doctor Care Advice Given Per Guideline SEE PCP WITHIN 24 HOURS: * IF OFFICE WILL BE OPEN: You need to be examined within the next 24 hours. Call your doctor (or NP/PA) when the office opens and make an appointment. CALL BACK IF: * You become worse CARE ADVICE given per Abdominal Pain, Male (Adult) guideline. Referrals REFERRED TO PCP OFFICE

## 2020-05-09 NOTE — Progress Notes (Signed)
Subjective:     [name] is a 44 y.o. male presenting for Abdominal Pain (mild x 4-6 weeks )     Abdominal Pain This is a new problem. The current episode started more than 1 month ago. The problem occurs constantly. The pain is located in the LLQ. The pain is at a severity of 1/10. The pain is mild. The quality of the pain is dull. The abdominal pain radiates to the back. Pertinent negatives include no constipation, diarrhea, nausea or vomiting. The pain is aggravated by movement (leaning or bending forward). The pain is relieved by nothing. He has tried nothing for the symptoms. kidney stone    Does not prevent him from daily activities Had a friend have similar issues and was eventually diagnosed with cancer  Review of Systems  Gastrointestinal: Positive for abdominal pain. Negative for constipation, diarrhea, nausea and vomiting.     Social History   Tobacco Use  Smoking Status Former Smoker  . Packs/day: 1.00  . Years: 18.00  . Pack years: 18.00  . Types: Cigarettes  . Start date: 01/17/1990  . Quit date: 08/19/2007  . Years since quitting: 12.7  Smokeless Tobacco Never Used        Objective:    BP Readings from Last 3 Encounters:  05/09/20 108/78  04/30/20 114/72  02/07/20 114/62   Wt Readings from Last 3 Encounters:  05/09/20 200 lb (90.7 kg)  04/30/20 200 lb 6.4 oz (90.9 kg)  02/07/20 203 lb (92.1 kg)    BP 108/78   Pulse (!) 57   Temp 98.3 F (36.8 C) (Temporal)   Wt 200 lb (90.7 kg)   SpO2 98%   BMI 29.53 kg/m    Physical Exam Constitutional:      Appearance: Normal appearance. He is not ill-appearing or diaphoretic.  HENT:     Right Ear: External ear normal.     Left Ear: External ear normal.     Nose: Nose normal.  Eyes:     General: No scleral icterus.    Extraocular Movements: Extraocular movements intact.     Conjunctiva/sclera: Conjunctivae normal.  Cardiovascular:     Rate and Rhythm: Normal rate and regular rhythm.      Heart sounds: No murmur heard.   Pulmonary:     Effort: Pulmonary effort is normal. No respiratory distress.     Breath sounds: Normal breath sounds. No wheezing.  Abdominal:     General: Abdomen is flat. Bowel sounds are normal. There is no distension.     Palpations: Abdomen is soft. There is no hepatomegaly or splenomegaly.     Tenderness: There is no abdominal tenderness. There is no right CVA tenderness, left CVA tenderness, guarding or rebound.     Comments: Pain location LLQ along the muscle - no mass or TTP  Musculoskeletal:     Cervical back: Neck supple.     Comments: No spinous process or paraspinous TTP. Abdominal pain not reproduced with hip flexor testing and normal strength  Skin:    General: Skin is warm and dry.  Neurological:     Mental Status: He is alert. Mental status is at baseline.  Psychiatric:        Mood and Affect: Mood normal.     UA: neg blood, normal      Assessment & Plan:   Problem List Items Addressed This Visit      Other   Left lower quadrant abdominal pain - Primary  Etiology unclear. Suspect muscle strain vs constipation (though denies symptoms). Endorse hx of stone so UA done which was normal. Will get blood work given duration. Recent kidney labs normal. Encouraged trial of NSAIDs and stool softener.       Relevant Orders   Hepatic Function Panel   CBC with Differential   POCT urinalysis dipstick (Completed)    Other Visit Diagnoses    Need for influenza vaccination       Relevant Orders   Flu Vaccine QUAD 36+ mos IM (Completed)       Return if symptoms worsen or fail to improve.  Lesleigh Noe, MD  This visit occurred during the SARS-CoV-2 public health emergency.  Safety protocols were in place, including screening questions prior to the visit, additional usage of staff PPE, and extensive cleaning of exam room while observing appropriate contact time as indicated for disinfecting solutions.

## 2020-05-09 NOTE — Telephone Encounter (Signed)
I spoke with pt; pt said the abd pain is very mild; pt can do daily activities. Pt said he just wants an appt to get mild abd pain checked out or see if needs to see urologist.Pt has no covid symptoms other than abd pain, no travel and no known exposure to + covid. Pt has already had  covid vaccines. Pt scheduled in office appt today at 10 AM with Dr Einar Pheasant.

## 2020-05-09 NOTE — Patient Instructions (Addendum)
Abdominal  - can try Ibuprofen or voltaren gel to see if that improves symptoms - labs today  - If not improvement you could also try a stool softener, fiber supplement, or prunes for possible constipation  - if everything normal and pain persisting recommend follow-up with Dr. [name]

## 2020-05-09 NOTE — Progress Notes (Signed)
Remote ICD transmission.   

## 2020-05-09 NOTE — Assessment & Plan Note (Addendum)
Etiology unclear. Suspect muscle strain vs constipation (though denies symptoms). Endorse hx of stone so UA done which was normal. Will get blood work given duration. Recent kidney labs normal. Encouraged trial of NSAIDs and stool softener.

## 2020-05-20 ENCOUNTER — Other Ambulatory Visit: Payer: Self-pay | Admitting: Nurse Practitioner

## 2020-05-21 ENCOUNTER — Other Ambulatory Visit: Payer: Self-pay | Admitting: Nurse Practitioner

## 2020-05-24 ENCOUNTER — Encounter: Payer: Self-pay | Admitting: Family Medicine

## 2020-06-19 NOTE — Telephone Encounter (Signed)
Pt had appt on 05/09/20.

## 2020-08-06 ENCOUNTER — Ambulatory Visit (INDEPENDENT_AMBULATORY_CARE_PROVIDER_SITE_OTHER): Payer: BC Managed Care – PPO

## 2020-08-06 DIAGNOSIS — I472 Ventricular tachycardia, unspecified: Secondary | ICD-10-CM

## 2020-08-07 LAB — CUP PACEART REMOTE DEVICE CHECK
Battery Remaining Longevity: 134 mo
Battery Voltage: 3.07 V
Brady Statistic RV Percent Paced: 0.7 %
Date Time Interrogation Session: 20211220091705
HighPow Impedance: 55 Ohm
HighPow Impedance: 94 Ohm
Implantable Lead Implant Date: 20071018
Implantable Lead Location: 753860
Implantable Lead Model: 6947
Implantable Pulse Generator Implant Date: 20210319
Lead Channel Impedance Value: 361 Ohm
Lead Channel Impedance Value: 418 Ohm
Lead Channel Pacing Threshold Amplitude: 0.75 V
Lead Channel Pacing Threshold Pulse Width: 0.4 ms
Lead Channel Sensing Intrinsic Amplitude: 13.375 mV
Lead Channel Sensing Intrinsic Amplitude: 13.375 mV
Lead Channel Setting Pacing Amplitude: 2 V
Lead Channel Setting Pacing Pulse Width: 0.4 ms
Lead Channel Setting Sensing Sensitivity: 1.2 mV

## 2020-08-15 ENCOUNTER — Other Ambulatory Visit: Payer: Self-pay | Admitting: Nurse Practitioner

## 2020-08-21 NOTE — Progress Notes (Signed)
Remote ICD transmission.   

## 2020-08-22 ENCOUNTER — Other Ambulatory Visit: Payer: Self-pay

## 2020-08-22 MED ORDER — METOPROLOL SUCCINATE ER 25 MG PO TB24: 25.0000 mg | ORAL_TABLET | Freq: Every day | ORAL | 2 refills | Status: DC

## 2020-08-29 ENCOUNTER — Other Ambulatory Visit: Payer: Self-pay | Admitting: Family Medicine

## 2020-09-10 ENCOUNTER — Telehealth: Payer: Self-pay | Admitting: Family Medicine

## 2020-09-10 DIAGNOSIS — Z0000 Encounter for general adult medical examination without abnormal findings: Secondary | ICD-10-CM

## 2020-09-10 DIAGNOSIS — R351 Nocturia: Secondary | ICD-10-CM

## 2020-09-10 DIAGNOSIS — E559 Vitamin D deficiency, unspecified: Secondary | ICD-10-CM

## 2020-09-10 DIAGNOSIS — R7309 Other abnormal glucose: Secondary | ICD-10-CM

## 2020-09-10 DIAGNOSIS — E786 Lipoprotein deficiency: Secondary | ICD-10-CM

## 2020-09-10 DIAGNOSIS — N401 Enlarged prostate with lower urinary tract symptoms: Secondary | ICD-10-CM

## 2020-09-10 DIAGNOSIS — E781 Pure hyperglyceridemia: Secondary | ICD-10-CM

## 2020-09-10 NOTE — Telephone Encounter (Signed)
-----   Message from Cloyd Stagers, RT sent at 08/27/2020  3:14 PM EST ----- Regarding: Lab Orders for Tuesday 1.25.2022 Please place lab orders for Tuesday 1.25.2022, office visit for physical on Tuesday 2.1.2022 Thank you, Dyke Maes RT(R)

## 2020-09-11 ENCOUNTER — Other Ambulatory Visit (INDEPENDENT_AMBULATORY_CARE_PROVIDER_SITE_OTHER): Payer: Self-pay

## 2020-09-11 ENCOUNTER — Other Ambulatory Visit: Payer: Self-pay

## 2020-09-11 DIAGNOSIS — Z0000 Encounter for general adult medical examination without abnormal findings: Secondary | ICD-10-CM

## 2020-09-11 DIAGNOSIS — R7309 Other abnormal glucose: Secondary | ICD-10-CM

## 2020-09-11 DIAGNOSIS — E781 Pure hyperglyceridemia: Secondary | ICD-10-CM

## 2020-09-11 DIAGNOSIS — N401 Enlarged prostate with lower urinary tract symptoms: Secondary | ICD-10-CM

## 2020-09-11 DIAGNOSIS — E786 Lipoprotein deficiency: Secondary | ICD-10-CM

## 2020-09-11 DIAGNOSIS — E559 Vitamin D deficiency, unspecified: Secondary | ICD-10-CM

## 2020-09-11 DIAGNOSIS — R351 Nocturia: Secondary | ICD-10-CM

## 2020-09-11 LAB — LIPID PANEL
Cholesterol: 171 mg/dL (ref 0–200)
HDL: 31.9 mg/dL — ABNORMAL LOW (ref >39.00)
NonHDL: 138.66
Total CHOL/HDL Ratio: 5
Triglycerides: 257 mg/dL — ABNORMAL HIGH (ref 0.0–149.0)
VLDL: 51.4 mg/dL — ABNORMAL HIGH (ref 0.0–40.0)

## 2020-09-11 LAB — CBC WITH DIFFERENTIAL/PLATELET
Basophils Absolute: 0 10*3/uL (ref 0.0–0.1)
Basophils Relative: 0.5 % (ref 0.0–3.0)
Eosinophils Absolute: 0.2 10*3/uL (ref 0.0–0.7)
Eosinophils Relative: 2 % (ref 0.0–5.0)
HCT: 40.5 % (ref 39.0–52.0)
Hemoglobin: 13.9 g/dL (ref 13.0–17.0)
Lymphocytes Relative: 37.3 % (ref 12.0–46.0)
Lymphs Abs: 2.8 10*3/uL (ref 0.7–4.0)
MCHC: 34.3 g/dL (ref 30.0–36.0)
MCV: 88.5 fl (ref 78.0–100.0)
Monocytes Absolute: 0.7 10*3/uL (ref 0.1–1.0)
Monocytes Relative: 9.4 % (ref 3.0–12.0)
Neutro Abs: 3.8 10*3/uL (ref 1.4–7.7)
Neutrophils Relative %: 50.8 % (ref 43.0–77.0)
Platelets: 203 10*3/uL (ref 150.0–400.0)
RBC: 4.57 Mil/uL (ref 4.22–5.81)
RDW: 13.1 % (ref 11.5–15.5)
WBC: 7.5 10*3/uL (ref 4.0–10.5)

## 2020-09-11 LAB — COMPREHENSIVE METABOLIC PANEL
ALT: 23 U/L (ref 0–53)
AST: 18 U/L (ref 0–37)
Albumin: 4.8 g/dL (ref 3.5–5.2)
Alkaline Phosphatase: 43 U/L (ref 39–117)
BUN: 10 mg/dL (ref 6–23)
CO2: 32 mEq/L (ref 19–32)
Calcium: 9.9 mg/dL (ref 8.4–10.5)
Chloride: 104 mEq/L (ref 96–112)
Creatinine, Ser: 0.92 mg/dL (ref 0.40–1.50)
GFR: 102.63 mL/min (ref >60.00)
Glucose, Bld: 90 mg/dL (ref 70–99)
Potassium: 4.4 mEq/L (ref 3.5–5.1)
Sodium: 141 mEq/L (ref 135–145)
Total Bilirubin: 0.7 mg/dL (ref 0.2–1.2)
Total Protein: 7.2 g/dL (ref 6.0–8.3)

## 2020-09-11 LAB — PSA: PSA: 0.75 ng/mL (ref 0.10–4.00)

## 2020-09-11 LAB — TSH: TSH: 0.88 u[IU]/mL (ref 0.35–4.50)

## 2020-09-11 LAB — VITAMIN D 25 HYDROXY (VIT D DEFICIENCY, FRACTURES): VITD: 37.92 ng/mL (ref 30.00–100.00)

## 2020-09-11 LAB — HEMOGLOBIN A1C: Hgb A1c MFr Bld: 5.5 % (ref 4.6–6.5)

## 2020-09-11 LAB — LDL CHOLESTEROL, DIRECT: Direct LDL: 106 mg/dL

## 2020-09-14 ENCOUNTER — Other Ambulatory Visit: Payer: BC Managed Care – PPO

## 2020-09-18 ENCOUNTER — Encounter: Payer: Self-pay | Admitting: Family Medicine

## 2020-09-18 ENCOUNTER — Ambulatory Visit (INDEPENDENT_AMBULATORY_CARE_PROVIDER_SITE_OTHER): Payer: BC Managed Care – PPO | Admitting: Family Medicine

## 2020-09-18 ENCOUNTER — Other Ambulatory Visit: Payer: Self-pay

## 2020-09-18 VITALS — BP 126/68 | HR 61 | Temp 97.1°F | Ht 69.0 in | Wt 195.5 lb

## 2020-09-18 DIAGNOSIS — Z0000 Encounter for general adult medical examination without abnormal findings: Secondary | ICD-10-CM | POA: Diagnosis not present

## 2020-09-18 DIAGNOSIS — E559 Vitamin D deficiency, unspecified: Secondary | ICD-10-CM | POA: Diagnosis not present

## 2020-09-18 DIAGNOSIS — E781 Pure hyperglyceridemia: Secondary | ICD-10-CM

## 2020-09-18 DIAGNOSIS — E786 Lipoprotein deficiency: Secondary | ICD-10-CM | POA: Diagnosis not present

## 2020-09-18 DIAGNOSIS — I472 Ventricular tachycardia, unspecified: Secondary | ICD-10-CM

## 2020-09-18 DIAGNOSIS — R7309 Other abnormal glucose: Secondary | ICD-10-CM

## 2020-09-18 DIAGNOSIS — N401 Enlarged prostate with lower urinary tract symptoms: Secondary | ICD-10-CM | POA: Diagnosis not present

## 2020-09-18 DIAGNOSIS — R351 Nocturia: Secondary | ICD-10-CM

## 2020-09-18 DIAGNOSIS — I428 Other cardiomyopathies: Secondary | ICD-10-CM

## 2020-09-18 NOTE — Assessment & Plan Note (Signed)
No clinical changes 

## 2020-09-18 NOTE — Assessment & Plan Note (Signed)
Level of 37.9 on 2000 iu of D3 daily  Will inc to 4000 iu daily  Disc imp to bone and overall health

## 2020-09-18 NOTE — Assessment & Plan Note (Signed)
Reviewed health habits including diet and exercise and skin cancer prevention Reviewed appropriate screening tests for age  Also reviewed health mt list, fam hx and immunization status , as well as social and family history   See HPI Labs reviewed  No prostate issues utd immunizations incl covid  No new cardiac issues Enc him to continue recent better lifestyle habits

## 2020-09-18 NOTE — Assessment & Plan Note (Signed)
Lab Results  Component Value Date   HGBA1C 5.5 09/11/2020   disc imp of low glycemic diet and wt loss to prevent DM2

## 2020-09-18 NOTE — Assessment & Plan Note (Signed)
Disc goals for lipids and reasons to control them Rev last labs with pt Rev low sat fat diet in detail Trig are high and HDL low  Disc inc exercise if able  Omega 3 fish oil/eatig fish may help also

## 2020-09-18 NOTE — Patient Instructions (Addendum)
Vitamin D level is low normal  You can increase the over the counter D to 4000 iu daily if you want to   Take care of yourself  Stay active  Keep up the better diet   Eat fish (with fins) for HDL  LDL cholesterol is better

## 2020-09-18 NOTE — Progress Notes (Signed)
Subjective:    Patient ID: [name], male    DOB: 24-Jan-1976, 45 y.o.   MRN: [id number]  This visit occurred during the SARS-CoV-2 public health emergency.  Safety protocols were in place, including screening questions prior to the visit, additional usage of staff PPE, and extensive cleaning of exam room while observing appropriate contact time as indicated for disinfecting solutions.    HPI Here for health maintenance exam and to review chronic medical problems    Wt Readings from Last 3 Encounters:  09/18/20 195 lb 8 oz (88.7 kg)  05/09/20 200 lb (90.7 kg)  04/30/20 200 lb 6.4 oz (90.9 kg)   Working on health habits in January - lost 8 lb total  Stopped eating out  Physical job -exercise  28.87 kg/m  Feeling pretty good overall  Stress as usual   Daughter is growing like crazy   Tdap 12/13 Flu shot 9/21 covid immunized with booster  Prostate health -BPH Lab Results  Component Value Date   PSA 0.75 09/11/2020   PSA 0.79 09/05/2019  has seen urology  Takes cialis  No nocturia since starting cpap  No prostate ca in family      Heart hx arrhythmia  Took him off of metoprolol (caused fatigue)  Heart rate is good   BP Readings from Last 3 Encounters:  09/18/20 126/68  05/09/20 108/78  04/30/20 114/72   Pulse Readings from Last 3 Encounters:  09/18/20 61  05/09/20 (!) 57  04/30/20 (!) 55    Vit D def Level of 37.9 Takes 2000 iu daily   Cholesterol  Lab Results  Component Value Date   CHOL 171 09/11/2020   CHOL 196 09/05/2019   CHOL 166 07/08/2018   Lab Results  Component Value Date   HDL 31.90 (L) 09/11/2020   HDL 36.30 (L) 09/05/2019   HDL 30.90 (L) 07/08/2018   No results found for: [facility] Lab Results  Component Value Date   TRIG 257.0 (H) 09/11/2020   TRIG 236.0 (H) 09/05/2019   TRIG 265.0 (H) 07/08/2018   Lab Results  Component Value Date   CHOLHDL 5 09/11/2020   CHOLHDL 5 09/05/2019   CHOLHDL 5 07/08/2018   Lab Results   Component Value Date   LDLDIRECT 106.0 09/11/2020   LDLDIRECT 130.0 09/05/2019   LDLDIRECT 110.0 07/08/2018   Has improved LDL  Eating better     Elevated glucose in the past Lab Results  Component Value Date   HGBA1C 5.5 09/11/2020  fasting glucose 90   Other labs  Lab Results  Component Value Date   WBC 7.5 09/11/2020   HGB 13.9 09/11/2020   HCT 40.5 09/11/2020   MCV 88.5 09/11/2020   PLT 203.0 09/11/2020   Lab Results  Component Value Date   TSH 0.88 09/11/2020    Lab Results  Component Value Date   CREATININE 0.92 09/11/2020   BUN 10 09/11/2020   NA 141 09/11/2020   K 4.4 09/11/2020   CL 104 09/11/2020   CO2 32 09/11/2020   Lab Results  Component Value Date   ALT 23 09/11/2020   AST 18 09/11/2020   ALKPHOS 43 09/11/2020   BILITOT 0.7 09/11/2020    Wife has HPV  He has never had lesions  Interested in vaccination if covered   Has derm visit planned today for skin screening   Patient Active Problem List   Diagnosis Date Noted  . VT (ventricular tachycardia) ([place]) 04/28/2020  . Arrhythmogenic right ventricular  cardiomyopathy ([place]) 09/14/2019  . Kidney stone 06/13/2019  . BPH (benign prostatic hyperplasia) 07/27/2018  . Elevated glucose level 04/06/2017  . Routine general medical examination at a health care facility 03/28/2016  . Hypertriglyceridemia 02/21/2014  . Low HDL (under 40) 02/21/2014  . Vitamin D deficiency 02/21/2014  . Allergic rhinitis 02/21/2014  . Erectile dysfunction 08/19/2013  . OSA (obstructive sleep apnea) 10/30/2011  . Environmental allergies 10/27/2011  . High risk medications (not anticoagulants) long-term use 10/27/2011  . Hypokalemia 09/29/2011  . 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 Implantable cardioverter-defibrillator (ICD) in situ 01/10/2011  . 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 Fatty liver 01/04/2009  . Gallstones 01/04/2009  . GERD 12/05/2008  . History of ventricular  tachycardia 11/03/2008  . ECZEMA 08/17/2007   Past Medical History:  Diagnosis Date  . AICD (automatic cardioverter/defibrillator) present 2007  . Anxiety   . 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 Asthma when younger  . History of chicken pox   . ICD (implantable cardiac defibrillator) in place   . Sleep apnea    uses cpap  . Ventricular tachycardia (HCC)    sotalol therapy;  catheter ablation at [facility] 10/10 and [facility] 2013   Past Surgical History:  Procedure Laterality Date  . CARDIAC CATHETERIZATION  06-01-06  . CARDIAC DEFIBRILLATOR PLACEMENT  06-03-06   Medtronic  . ICD GENERATOR CHANGEOUT N/A 11/04/2019   Procedure: ICD GENERATOR CHANGEOUT;  Surgeon: Deboraha Sprang, MD;  Location: [facility];  Service: Cardiovascular;  Laterality: N/A;  . IMPLANTABLE CARDIOVERTER DEFIBRILLATOR GENERATOR CHANGE N/A 12/11/2011   Procedure: IMPLANTABLE CARDIOVERTER DEFIBRILLATOR GENERATOR CHANGE;  Surgeon: Deboraha Sprang, MD;  Location: [facility] CATH LAB;  Service: Cardiovascular;  Laterality: N/A;  . vt ablation  10/10, 10/13   x 2   Social History   Tobacco Use  . Smoking status: Former Smoker    Packs/day: 1.00    Years: 18.00    Pack years: 18.00    Types: Cigarettes    Start date: 01/17/1990    Quit date: 08/19/2007    Years since quitting: 13.0  . Smokeless tobacco: Never Used  Vaping Use  . Vaping Use: Never used  Substance Use Topics  . Alcohol use: No    Alcohol/week: 0.0 standard drinks  . Drug use: No   Family History  Problem Relation Age of Onset  . Sudden death Father   . Asthma Mother   . Diabetes Other   . Stroke Other   . Heart attack Other   . Prostate cancer Other   . Breast cancer Other   . Ovarian cancer Other   . Uterine cancer Other   . Colon cancer Other   . Drug abuse Other   . Depression Other   . Lung cancer Maternal Grandfather   . Heart disease Maternal Grandfather   . Heart disease Paternal Grandfather   .  Hypertension Paternal Grandmother    Allergies  Allergen Reactions  . Bee Venom Anaphylaxis  . Lidocaine     [name]   Current Outpatient Medications on File Prior to Visit  Medication Sig Dispense Refill  . Ascorbic Acid (VITAMIN C) 1000 MG tablet Take 1,000 mg by mouth at bedtime.    . Cetirizine HCl (ZYRTEC ALLERGY PO) Take 1 tablet by mouth as needed (allergies).    . Cholecalciferol (VITAMIN D3) 50 MCG (2000 UT) TABS Take 2,000 Units by mouth  daily.    . fexofenadine (ALLEGRA) 180 MG tablet Take 180 mg by mouth daily as needed (allergies.).     Marland Kitchen KLOR-CON M10 10 MEQ tablet TAKE 4 TABLETS BY MOUTH TWICE A DAY 720 tablet 3  . magnesium oxide (MAG-OX) 400 MG tablet TAKE 1 TABLET BY MOUTH TWICE A DAY 180 tablet 3  . montelukast (SINGULAIR) 10 MG tablet TAKE 1 TABLET BY MOUTH EVERY DAY 90 tablet 1  . Multiple Vitamin (MULITIVITAMIN WITH MINERALS) TABS Take 1 tablet by mouth daily.    Marland Kitchen SOTALOL AF 120 MG TABS TAKE 1 & 1/2 TABLETS BY MOUTH TWICE DAILY 270 tablet 3  . tadalafil (CIALIS) 5 MG tablet Take 5 mg by mouth daily as needed for erectile dysfunction.    . vitamin E 180 MG (400 UNITS) capsule Take 400 Units by mouth at bedtime.    . Zinc 25 MG TABS Take 25 mg by mouth daily.     No current facility-administered medications on file prior to visit.    Review of Systems  Constitutional: Negative for activity change, appetite change, fatigue, fever and unexpected weight change.  HENT: Negative for congestion, rhinorrhea, sore throat and trouble swallowing.   Eyes: Negative for pain, redness, itching and visual disturbance.  Respiratory: Negative for cough, chest tightness, shortness of breath and wheezing.   Cardiovascular: Negative for chest pain and palpitations.  Gastrointestinal: Negative for abdominal pain, blood in stool, constipation, diarrhea and nausea.  Endocrine: Negative for cold intolerance, heat intolerance, polydipsia and polyuria.  Genitourinary: Negative for  difficulty urinating, dysuria, frequency and urgency.  Musculoskeletal: Negative for arthralgias, joint swelling and myalgias.  Skin: Negative for pallor and rash.  Neurological: Negative for dizziness, tremors, weakness, numbness and headaches.  Hematological: Negative for adenopathy. Does not bruise/bleed easily.  Psychiatric/Behavioral: Negative for decreased concentration and dysphoric mood. The patient is not nervous/anxious.        Objective:   Physical Exam Constitutional:      General: He is not in acute distress.    Appearance: Normal appearance. He is well-developed and normal weight. He is not ill-appearing or diaphoretic.  HENT:     Head: Normocephalic and atraumatic.     Right Ear: Tympanic membrane, ear canal and external ear normal.     Left Ear: Tympanic membrane, ear canal and external ear normal.     Nose: Nose normal. No congestion.     Mouth/Throat:     Mouth: Mucous membranes are moist.     Pharynx: Oropharynx is clear. No posterior oropharyngeal erythema.  Eyes:     General: No scleral icterus.       Right eye: No discharge.        Left eye: No discharge.     Conjunctiva/sclera: Conjunctivae normal.     Pupils: Pupils are equal, round, and reactive to light.  Neck:     Thyroid: No thyromegaly.     Vascular: No carotid bruit or JVD.  Cardiovascular:     Rate and Rhythm: Normal rate and regular rhythm.     Pulses: Normal pulses.     Heart sounds: Normal heart sounds. No gallop.   Pulmonary:     Effort: Pulmonary effort is normal. No respiratory distress.     Breath sounds: Normal breath sounds. No wheezing or rales.     Comments: Good air exch Chest:     Chest wall: No tenderness.  Abdominal:     General: Bowel sounds are normal. There is no distension or  abdominal bruit.     Palpations: Abdomen is soft. There is no mass.     Tenderness: There is no abdominal tenderness.     Hernia: No hernia is present.  Musculoskeletal:        General: No  tenderness.     Cervical back: Normal range of motion and neck supple. No rigidity. No muscular tenderness.     Right lower leg: No edema.     Left lower leg: No edema.  Lymphadenopathy:     Cervical: No cervical adenopathy.  Skin:    General: Skin is warm and dry.     Coloration: Skin is not pale.     Findings: No erythema or rash.     Comments: Solar lentigines diffusely   Neurological:     Mental Status: He is alert.     Cranial Nerves: No cranial nerve deficit.     Motor: No abnormal muscle tone.     Coordination: Coordination normal.     Gait: Gait normal.     Deep Tendon Reflexes: Reflexes are normal and symmetric. Reflexes normal.  Psychiatric:        Mood and Affect: Mood normal.        Cognition and Memory: Cognition and memory normal.           Assessment & Plan:   Problem List Items Addressed This Visit      Cardiovascular and Mediastinum   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HCC)    No clinical changes       RESOLVED: VT (ventricular tachycardia) (HCC)     Genitourinary   BPH (benign prostatic hyperplasia)    Lab Results  Component Value Date   PSA 0.75 09/11/2020   PSA 0.79 09/05/2019    No voiding symptoms  No prostate cancer in family         Other   Hypertriglyceridemia   Low HDL (under 40)    Disc goals for lipids and reasons to control them Rev last labs with pt Rev low sat fat diet in detail Trig are high and HDL low  Disc inc exercise if able  Omega 3 fish oil/eatig fish may help also        Vitamin D deficiency    Level of 37.9 on 2000 iu of D3 daily  Will inc to 4000 iu daily  Disc imp to bone and overall health      Routine general medical examination at a health care facility - Primary    Reviewed health habits including diet and exercise and skin cancer prevention Reviewed appropriate screening tests for age  Also reviewed health mt list, fam hx and immunization status , as well as social and family  history   See HPI Labs reviewed  No prostate issues utd immunizations incl covid  No new cardiac issues Enc him to continue recent better lifestyle habits       Elevated glucose level    Lab Results  Component Value Date   HGBA1C 5.5 09/11/2020   disc imp of low glycemic diet and wt loss to prevent DM2

## 2020-09-18 NOTE — Assessment & Plan Note (Signed)
Lab Results  Component Value Date   PSA 0.75 09/11/2020   PSA 0.79 09/05/2019    No voiding symptoms  No prostate cancer in family

## 2020-11-05 ENCOUNTER — Ambulatory Visit (INDEPENDENT_AMBULATORY_CARE_PROVIDER_SITE_OTHER): Payer: BC Managed Care – PPO

## 2020-11-05 DIAGNOSIS — I472 Ventricular tachycardia, unspecified: Secondary | ICD-10-CM

## 2020-11-05 LAB — CUP PACEART REMOTE DEVICE CHECK
Battery Remaining Longevity: 132 mo
Battery Voltage: 3.05 V
Brady Statistic RV Percent Paced: 0.84 %
Date Time Interrogation Session: 20220321033525
HighPow Impedance: 54 Ohm
HighPow Impedance: 79 Ohm
Implantable Lead Implant Date: 20071018
Implantable Lead Location: 753860
Implantable Lead Model: 6947
Implantable Pulse Generator Implant Date: 20210319
Lead Channel Impedance Value: 418 Ohm
Lead Channel Impedance Value: 475 Ohm
Lead Channel Pacing Threshold Amplitude: 0.625 V
Lead Channel Pacing Threshold Pulse Width: 0.4 ms
Lead Channel Sensing Intrinsic Amplitude: 14.125 mV
Lead Channel Sensing Intrinsic Amplitude: 14.125 mV
Lead Channel Setting Pacing Amplitude: 2 V
Lead Channel Setting Pacing Pulse Width: 0.4 ms
Lead Channel Setting Sensing Sensitivity: 1.2 mV

## 2020-11-07 ENCOUNTER — Ambulatory Visit
Admission: EM | Admit: 2020-11-07 | Discharge: 2020-11-07 | Disposition: A | Discharge status: Home / Self Care | Payer: BC Managed Care – PPO

## 2020-11-07 ENCOUNTER — Telehealth: Payer: Self-pay

## 2020-11-07 DIAGNOSIS — B349 Viral infection, unspecified: Secondary | ICD-10-CM | POA: Diagnosis not present

## 2020-11-07 NOTE — ED Triage Notes (Addendum)
Pt c/o n/v/d, abdominal cramping intermittently, mild congestion, onset Sunday, that has since improved. Pt states he has not vomited since Monday, mild diarrhea today. Had Tmax of 100 on Monday, no c/o fever since. Reports his coworker had n/v/d on Friday.  Pt states that he has a baseline of irregular heart beats, and had more "fluttery feelings' on Monday, since then has improved. Denies SOB, sore throat, cough, CP, change to baseline heart beat. States he is tolerating po fluids, was pushing fluids with gatorade, ate some solid food today. Took zofran and bepto bismol yesterday. Took oxycodone Monday night for abdominal pain.  Here today for medical guidance on returning to work

## 2020-11-07 NOTE — Telephone Encounter (Signed)
Pt said started with nausea on 11/04/20 and on 11/06/20 started with vomiting and watery diarrhea.pt said last vomited on 11/05/20 and last diarrhea was earlier this morning.pt has upper and lower abd pain; pt feels like his intestines are swollen. Pt had fever, chills and h/a on 11/05/20; pt still has head congestion and runny nose. Pt is going to [facility] on [address] for eval and possible testing and to see when can return to work. Sending note to Dr [name] as Juluis Rainier.

## 2020-11-07 NOTE — Discharge Instructions (Addendum)
Most likely this was a stomach bug I feel it is safe for you to return to work.  Continue to hydrate, rest.  Follow up as needed for continued or worsening symptoms

## 2020-11-07 NOTE — Telephone Encounter (Signed)
Aware  Will watch for correspondence

## 2020-11-08 NOTE — ED Provider Notes (Signed)
Roderic Palau    CSN: [id number] Arrival date & time: 11/07/20  1231      History   Chief Complaint Chief Complaint  Patient presents with  . Emesis    HPI GRAIG HESSLING is a 45 y.o. male.   Pt is a 45 year old male that presents with n/v/d, abdominal cramping intermittently, mild congestion, onset Sunday, that has since improved. Pt states he has not vomited since Monday, mild diarrhea today. Had Tmax of 100 on Monday, no c/o fever since. Reports his coworker had n/v/d on Friday. Symptoms have mostly resolved.States he is tolerating po fluids, was pushing fluids with gatorade, ate some solid food today. Took zofran and bepto bismol yesterday. Here today for medical guidance on returning to work.   Emesis   Past Medical History:  Diagnosis Date  . AICD (automatic cardioverter/defibrillator) present 2007  . Anxiety   . 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 Asthma when younger  . History of chicken pox   . ICD (implantable cardiac defibrillator) in place   . Sleep apnea    uses cpap  . Ventricular tachycardia (HCC)    sotalol therapy;  catheter ablation at [facility] 10/10 and [facility] 2013    Patient Active Problem List   Diagnosis Date Noted  . 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[place]) 09/14/2019  . Kidney stone 06/13/2019  . BPH (benign prostatic hyperplasia) 07/27/2018  . Elevated glucose level 04/06/2017  . Routine general medical examination at a health care facility 03/28/2016  . Hypertriglyceridemia 02/21/2014  . Low HDL (under 40) 02/21/2014  . Vitamin D deficiency 02/21/2014  . Allergic rhinitis 02/21/2014  . Erectile dysfunction 08/19/2013  . OSA (obstructive sleep apnea) 10/30/2011  . Environmental allergies 10/27/2011  . High risk medications (not anticoagulants) long-term use 10/27/2011  . Hypokalemia 09/29/2011  . 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 Implantable cardioverter-defibrillator  (ICD) in situ 01/10/2011  . 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 Fatty liver 01/04/2009  . Gallstones 01/04/2009  . GERD 12/05/2008  . History of ventricular tachycardia 11/03/2008  . ECZEMA 08/17/2007    Past Surgical History:  Procedure Laterality Date  . CARDIAC CATHETERIZATION  06-01-06  . CARDIAC DEFIBRILLATOR PLACEMENT  06-03-06   Medtronic  . ICD GENERATOR CHANGEOUT N/A 11/04/2019   Procedure: ICD GENERATOR CHANGEOUT;  Surgeon: Deboraha Sprang, MD;  Location: [facility];  Service: Cardiovascular;  Laterality: N/A;  . IMPLANTABLE CARDIOVERTER DEFIBRILLATOR GENERATOR CHANGE N/A 12/11/2011   Procedure: IMPLANTABLE CARDIOVERTER DEFIBRILLATOR GENERATOR CHANGE;  Surgeon: Deboraha Sprang, MD;  Location: [facility] CATH LAB;  Service: Cardiovascular;  Laterality: N/A;  . vt ablation  10/10, 10/13   x 2       Home Medications    Prior to Admission medications   Medication Sig Start Date End Date Taking? Authorizing Provider  Ascorbic Acid (VITAMIN C) 1000 MG tablet Take 1,000 mg by mouth at bedtime.   Yes [provider]  Cholecalciferol (VITAMIN D3) 50 MCG (2000 UT) TABS Take 2,000 Units by mouth daily.   Yes [provider]  KLOR-CON M10 10 MEQ tablet TAKE 4 TABLETS BY MOUTH TWICE A DAY 05/23/20  Yes Deboraha Sprang, MD  magnesium oxide (MAG-OX) 400 MG tablet TAKE 1 TABLET BY MOUTH TWICE A DAY 08/15/20  Yes Deboraha Sprang, MD  montelukast (SINGULAIR) 10 MG tablet TAKE 1 TABLET BY MOUTH EVERY DAY 08/29/20  Yes [name], Wishek  A, MD  SOTALOL AF 120 MG TABS TAKE 1 & 1/2 TABLETS BY MOUTH TWICE DAILY 05/21/20  Yes Deboraha Sprang, MD  tadalafil (CIALIS) 5 MG tablet Take 5 mg by mouth daily as needed for erectile dysfunction.   Yes [provider]  vitamin E 180 MG (400 UNITS) capsule Take 400 Units by mouth at bedtime.   Yes [provider]  Zinc 25 MG TABS Take 25 mg by mouth daily.   Yes [provider]  Cetirizine  HCl (ZYRTEC ALLERGY PO) Take 1 tablet by mouth as needed (allergies).    [provider]  fexofenadine (ALLEGRA) 180 MG tablet Take 180 mg by mouth daily as needed (allergies.).     [provider]  Multiple Vitamin (MULITIVITAMIN WITH MINERALS) TABS Take 1 tablet by mouth daily.    [provider]    Family History Family History  Problem Relation Age of Onset  . Sudden death Father   . Asthma Mother   . Diabetes Other   . Stroke Other   . Heart attack Other   . Prostate cancer Other   . Breast cancer Other   . Ovarian cancer Other   . Uterine cancer Other   . Colon cancer Other   . Drug abuse Other   . Depression Other   . Lung cancer Maternal Grandfather   . Heart disease Maternal Grandfather   . Heart disease Paternal Grandfather   . Hypertension Paternal Grandmother     Social History Social History   Tobacco Use  . Smoking status: Former Smoker    Packs/day: 1.00    Years: 18.00    Pack years: 18.00    Types: Cigarettes    Start date: 01/17/1990    Quit date: 08/19/2007    Years since quitting: 13.2  . Smokeless tobacco: Never Used  Vaping Use  . Vaping Use: Never used  Substance Use Topics  . Alcohol use: No    Alcohol/week: 0.0 standard drinks  . Drug use: No     Allergies   Bee venom and Lidocaine   Review of Systems Review of Systems  Gastrointestinal: Positive for vomiting.     Physical Exam Triage Vital Signs ED Triage Vitals  Enc Vitals Group     BP 11/07/20 1252 116/84     Pulse Rate 11/07/20 1252 67     Resp 11/07/20 1252 18     Temp 11/07/20 1252 98.8 F (37.1 C)     Temp Source 11/07/20 1252 Oral     SpO2 11/07/20 1252 98 %     Weight --      Height --      Head Circumference --      Peak Flow --      Pain Score 11/07/20 1246 3     Pain Loc --      Pain Edu? --      Excl. in [place]? --    No data found.  Updated Vital Signs BP 116/84 (BP Location: Left Arm)   Pulse 67   Temp 98.8 F (37.1 C) (Oral)    Resp 18   SpO2 98%   Visual Acuity Right Eye Distance:   Left Eye Distance:   Bilateral Distance:    Right Eye Near:   Left Eye Near:    Bilateral Near:     Physical Exam Vitals and nursing note reviewed.  Constitutional:      Appearance: Normal appearance.  HENT:  Head: Normocephalic and atraumatic.     Nose: Nose normal.  Eyes:     Conjunctiva/sclera: Conjunctivae normal.  Pulmonary:     Effort: Pulmonary effort is normal.  Musculoskeletal:        General: Normal range of motion.     Cervical back: Normal range of motion.  Skin:    General: Skin is warm and dry.  Neurological:     Mental Status: He is alert.  Psychiatric:        Mood and Affect: Mood normal.      UC Treatments / Results  Labs (all labs ordered are listed, but only abnormal results are displayed) Labs Reviewed - No data to display  EKG   Radiology No results found.  Procedures Procedures (including critical care time)  Medications Ordered in UC Medications - No data to display  Initial Impression / Assessment and Plan / UC Course  I have reviewed the triage vital signs and the nursing notes.  Pertinent labs & imaging results that were available during my care of the patient were reviewed by me and considered in my medical decision making (see chart for details).     Viral illness Most likely viral gastroenteritis that has resolved. Recommend to continue to rest, hydrate. Ensured that it is safe at this point to go back to work. Follow up as needed for continued or worsening symptoms  Final Clinical Impressions(s) / UC Diagnoses   Final diagnoses:  Viral illness     Discharge Instructions     Most likely this was a stomach bug I feel it is safe for you to return to work.  Continue to hydrate, rest.  Follow up as needed for continued or worsening symptoms     ED Prescriptions    None     PDMP not reviewed this encounter.   Orvan July, NP 11/08/20  [id number]

## 2020-11-13 NOTE — Progress Notes (Signed)
Remote ICD transmission.   

## 2020-12-24 ENCOUNTER — Telehealth: Payer: Self-pay | Admitting: Family Medicine

## 2020-12-24 NOTE — Telephone Encounter (Signed)
Pt called in wanted to set up an appointment for the HPV vaccine. Wanted to make sure its okay to do.  Please advise

## 2020-12-24 NOTE — Telephone Encounter (Signed)
He will need to check with his insurance first because at his age it may not be covered and he will need to know how much is will cost.  I am fine with him getting it if affordable

## 2020-12-24 NOTE — Telephone Encounter (Signed)
Pt has already checked with insurance and they will cover it. Pt was in the middle of something with his baby so will cb tomorrow to schedule appt

## 2021-01-02 ENCOUNTER — Other Ambulatory Visit: Payer: Self-pay

## 2021-01-02 ENCOUNTER — Ambulatory Visit (INDEPENDENT_AMBULATORY_CARE_PROVIDER_SITE_OTHER): Payer: BC Managed Care – PPO

## 2021-01-02 DIAGNOSIS — Z23 Encounter for immunization: Secondary | ICD-10-CM | POA: Diagnosis not present

## 2021-01-02 NOTE — Progress Notes (Signed)
Per orders of Dr. [name], 1st injection of Gardasil, given by Loreen Freud. Patient tolerated injection well.

## 2021-02-04 ENCOUNTER — Ambulatory Visit (INDEPENDENT_AMBULATORY_CARE_PROVIDER_SITE_OTHER): Payer: BC Managed Care – PPO

## 2021-02-04 DIAGNOSIS — I472 Ventricular tachycardia, unspecified: Secondary | ICD-10-CM

## 2021-02-07 LAB — CUP PACEART REMOTE DEVICE CHECK
Battery Remaining Longevity: 131 mo
Battery Voltage: 3.04 V
Brady Statistic RV Percent Paced: 0.76 %
Date Time Interrogation Session: 20220623121746
HighPow Impedance: 51 Ohm
HighPow Impedance: 76 Ohm
Implantable Lead Implant Date: 20071018
Implantable Lead Location: 753860
Implantable Lead Model: 6947
Implantable Pulse Generator Implant Date: 20210319
Lead Channel Impedance Value: 399 Ohm
Lead Channel Impedance Value: 456 Ohm
Lead Channel Pacing Threshold Amplitude: 0.75 V
Lead Channel Pacing Threshold Pulse Width: 0.4 ms
Lead Channel Sensing Intrinsic Amplitude: 11.875 mV
Lead Channel Sensing Intrinsic Amplitude: 11.875 mV
Lead Channel Setting Pacing Amplitude: 2 V
Lead Channel Setting Pacing Pulse Width: 0.4 ms
Lead Channel Setting Sensing Sensitivity: 1.2 mV

## 2021-02-22 NOTE — Progress Notes (Signed)
Remote ICD transmission.   

## 2021-02-27 ENCOUNTER — Other Ambulatory Visit: Payer: Self-pay | Admitting: Family Medicine

## 2021-03-05 ENCOUNTER — Other Ambulatory Visit: Payer: Self-pay

## 2021-03-05 ENCOUNTER — Ambulatory Visit (INDEPENDENT_AMBULATORY_CARE_PROVIDER_SITE_OTHER): Payer: BC Managed Care – PPO

## 2021-03-05 DIAGNOSIS — Z23 Encounter for immunization: Secondary | ICD-10-CM | POA: Diagnosis not present

## 2021-03-07 ENCOUNTER — Ambulatory Visit: Payer: BC Managed Care – PPO | Admitting: Family Medicine

## 2021-03-07 ENCOUNTER — Telehealth: Payer: Self-pay | Admitting: Internal Medicine

## 2021-03-07 ENCOUNTER — Encounter: Payer: Self-pay | Admitting: Family Medicine

## 2021-03-07 ENCOUNTER — Other Ambulatory Visit: Payer: Self-pay

## 2021-03-07 VITALS — BP 108/64 | HR 60 | Temp 98.0°F | Ht 69.0 in | Wt 199.0 lb

## 2021-03-07 DIAGNOSIS — K859 Acute pancreatitis without necrosis or infection, unspecified: Secondary | ICD-10-CM | POA: Insufficient documentation

## 2021-03-07 DIAGNOSIS — K8020 Calculus of gallbladder without cholecystitis without obstruction: Secondary | ICD-10-CM | POA: Diagnosis not present

## 2021-03-07 DIAGNOSIS — F430 Acute stress reaction: Secondary | ICD-10-CM | POA: Diagnosis not present

## 2021-03-07 DIAGNOSIS — K8500 Idiopathic acute pancreatitis without necrosis or infection: Secondary | ICD-10-CM | POA: Diagnosis not present

## 2021-03-07 LAB — LIPASE: Lipase: 39 U/L (ref 11.0–59.0)

## 2021-03-07 LAB — HEPATIC FUNCTION PANEL
ALT: 22 U/L (ref 0–53)
AST: 19 U/L (ref 0–37)
Albumin: 4.5 g/dL (ref 3.5–5.2)
Alkaline Phosphatase: 41 U/L (ref 39–117)
Bilirubin, Direct: 0.1 mg/dL (ref 0.0–0.3)
Total Bilirubin: 0.5 mg/dL (ref 0.2–1.2)
Total Protein: 7.1 g/dL (ref 6.0–8.3)

## 2021-03-07 NOTE — Patient Instructions (Addendum)
Try and steer clear of the fatty foods when you can   Lab today  I will place a GI referral-you will get a call   Keep up with fluids  If symptoms return please let us know  Go to ER if severe   Keep [place] posted re: stress and memory  Take care of yourself best you can

## 2021-03-07 NOTE — Telephone Encounter (Signed)
Left message for patient to call the office to schedule the follow-up visit

## 2021-03-07 NOTE — Assessment & Plan Note (Signed)
Recent hospitalization at [facility] records, lab results and studies in detail  Clinically improved  Determined idiopathic  Lab for LFT today and lipase  H/o gallstones but no evidence of CBD stone  GI referral done

## 2021-03-07 NOTE — Progress Notes (Signed)
Subjective:    Patient ID: [name], male    DOB: 1976/02/18, 45 y.o.   MRN: [id number]  This visit occurred during the SARS-CoV-2 public health emergency.  Safety protocols were in place, including screening questions prior to the visit, additional usage of staff PPE, and extensive cleaning of exam room while observing appropriate contact time as indicated for disinfecting solutions.   HPI Pt presents for f/u of hospitalization for acute pancreatitis at [facility] on 6/25   Wt Readings from Last 3 Encounters:  03/07/21 199 lb (90.3 kg)  09/18/20 195 lb 8 oz (88.7 kg)  05/09/20 200 lb (90.7 kg)   29.39 kg/m   He presented to the ER with epigastric pain that rad to back and n/v Lipase was up to 1130 and mild transaminase elevation AST 143 and ALT 179 Treated with IVF and pain management and diet was gradually advanced  D/c with px for 12 tabs of percocet and inst to eat smaller more frequent meals   Aoivded traditional anti emetics given poss QT prolongation with heart condition   CT scen w/o choledocholithiasis and no evid of biliary obs or cholangitis  Nl total bilirubin   Also noted mild bladder wall thickening  Sigmoid diverticulosis    Wbc was 12.7 then 9.4  Ast and alt dec to 42 and 93 Mag nl at 1.7 Neg covid test  Serum IgG4 91 in the normal range   Does not drink alcohol  He had just returned from vacation  (no alcohol but possibly more fatty food than normal)   did eat out more   H/o fatty liver   Notes he did have stomach bug that went around work earlier in the year  Several month later had 24 h of abd pain with n/v   Not as strict with diet since that event-eating on the run More fried food  More fast food  Drinks water  Less vegetables  Tired but otherwise back to normal   More stress lately -? 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[place]) 09/14/2019   Kidney stone 06/13/2019   BPH (benign prostatic hyperplasia) 07/27/2018   Elevated glucose level 04/06/2017   Routine general medical examination at a health care facility 03/28/2016   Hypertriglyceridemia 02/21/2014   Low HDL (under 40) 02/21/2014   Vitamin D deficiency 02/21/2014   Allergic rhinitis 02/21/2014   Erectile dysfunction 08/19/2013   OSA (obstructive sleep apnea) 10/30/2011   Environmental allergies 10/27/2011   High risk medications (not anticoagulants) long-term use 10/27/2011   Hypokalemia 09/29/2011   Arrhythmogenic RV cardiomyopathy 03/04/2011   Implantable cardioverter-defibrillator (ICD) in situ 01/10/2011   PTSD (post-traumatic stress disorder) 2/2 ICD shocks 01/10/2011   Fatty liver 01/04/2009   Gallstones 01/04/2009   GERD 12/05/2008   History of ventricular tachycardia 11/03/2008   ECZEMA 08/17/2007   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   Asthma when younger   History of chicken pox    ICD (implantable cardiac defibrillator) in place    Sleep apnea    uses cpap   Ventricular  tachycardia ([place])    sotalol therapy;  catheter ablation at [facility] 10/10 and [facility] 2013   Past Surgical History:  Procedure Laterality Date   CARDIAC CATHETERIZATION  06-01-06   CARDIAC DEFIBRILLATOR PLACEMENT  06-03-06   Medtronic   ICD GENERATOR CHANGEOUT N/A 11/04/2019   Procedure: ICD [place];  Surgeon: Deboraha Sprang, MD;  Location: [facility];  Service: Cardiovascular;  Laterality: N/A;   IMPLANTABLE CARDIOVERTER DEFIBRILLATOR GENERATOR CHANGE N/A 12/11/2011   Procedure: IMPLANTABLE CARDIOVERTER DEFIBRILLATOR GENERATOR CHANGE;   Surgeon: Deboraha Sprang, MD;  Location[facility] C[facility];  Service: Cardiovascular;  Laterality: N/A;   vt ablation  10/10, 10/13   x 2   Social History   Tobacco Use   Smoking status: Former    Packs/day: 1.00    Years: 18.00    Pack years: 18.00    Types: Cigarettes    Start date: 01/17/1990    Quit date: 08/19/2007    Years since quitting: 13.5   Smokeless tobacco: Never  Vaping Use   Vaping Use: Never used  Substance Use Topics   Alcohol use: No    Alcohol/week: 0.0 standard drinks   Drug use: No   Family History  Problem Relation Age of Onset   Sudden death Father    Asthma Mother    Diabetes Other    Stroke Other    Heart attack Other    Prostate cancer Other    Breast cancer Other    Ovarian cancer Other    Uterine cancer Other    Colon cancer Other    Drug abuse Other    Depression Other    Lung cancer Maternal Grandfather    Heart disease Maternal Grandfather    Heart disease Paternal Grandfather    Hypertension Paternal Grandmother    Allergies  Allergen Reactions   Bee Venom Anaphylaxis   Lidocaine     [name]   Current Outpatient Medications on File Prior to Visit  Medication Sig Dispense Refill   Ascorbic Acid (VITAMIN C) 1000 MG tablet Take 1,000 mg by mouth at bedtime.     Cetirizine HCl (ZYRTEC ALLERGY PO) Take 1 tablet by mouth as needed (allergies).     Cholecalciferol (VITAMIN D3) 50 MCG (2000 UT) TABS Take 2,000 Units by mouth daily.     fexofenadine (ALLEGRA) 180 MG tablet Take 180 mg by mouth daily as needed (allergies.).      KLOR-CON M10 10 MEQ tablet TAKE 4 TABLETS BY MOUTH TWICE A DAY 720 tablet 3   magnesium oxide (MAG-OX) 400 MG tablet TAKE 1 TABLET BY MOUTH TWICE A DAY 180 tablet 3   montelukast (SINGULAIR) 10 MG tablet TAKE 1 TABLET BY MOUTH EVERY DAY 90 tablet 1   Multiple Vitamin (MULITIVITAMIN WITH MINERALS) TABS Take 1 tablet by mouth daily.     SOTALOL AF 120 MG TABS TAKE 1 & 1/2 TABLETS BY MOUTH TWICE DAILY 270 tablet 3    tadalafil (CIALIS) 5 MG tablet Take 5 mg by mouth daily as needed for erectile dysfunction.     vitamin E 180 MG (400 UNITS) capsule Take 400 Units by mouth at bedtime.     Zinc 25 MG TABS Take 25 mg by mouth daily.     No current facility-administered medications on file prior to visit.     Review of Systems  Constitutional:  Positive for fatigue. Negative for activity change, appetite change, fever and unexpected weight change.  HENT:  Negative for congestion, rhinorrhea, sore throat and trouble swallowing.   Eyes:  Negative for pain, redness, itching and visual disturbance.  Respiratory:  Negative for cough, chest tightness, shortness of breath and wheezing.   Cardiovascular:  Negative for chest pain and palpitations.  Gastrointestinal:  Negative for abdominal distention, abdominal pain, anal bleeding, blood in stool, constipation, diarrhea, nausea and vomiting.  Endocrine: Negative for cold intolerance, heat intolerance, polydipsia and polyuria.  Genitourinary:  Negative for difficulty urinating, dysuria, frequency and urgency.  Musculoskeletal:  Negative for arthralgias, joint swelling and myalgias.  Skin:  Negative for pallor and rash.  Neurological:  Negative for dizziness, tremors, weakness, numbness and headaches.  Hematological:  Negative for adenopathy. Does not bruise/bleed easily.  Psychiatric/Behavioral:  Negative for decreased concentration and dysphoric mood. The patient is not nervous/anxious.       Objective:   Physical Exam Constitutional:      General: He is not in acute distress.    Appearance: Normal appearance. He is well-developed and normal weight. He is not ill-appearing or diaphoretic.  HENT:     Head: Normocephalic and atraumatic.  Eyes:     Conjunctiva/sclera: Conjunctivae normal.     Pupils: Pupils are equal, round, and reactive to light.  Neck:     Thyroid: No thyromegaly.     Vascular: No carotid bruit or JVD.  Cardiovascular:     Rate and Rhythm:  Normal rate and regular rhythm.     Heart sounds: Normal heart sounds.    No gallop.  Pulmonary:     Effort: Pulmonary effort is normal. No respiratory distress.     Breath sounds: Normal breath sounds. No wheezing or rales.  Abdominal:     General: Abdomen is flat. Bowel sounds are normal. There is no distension or abdominal bruit.     Palpations: Abdomen is soft. There is no fluid wave, hepatomegaly, splenomegaly, mass or pulsatile mass.     Tenderness: There is no abdominal tenderness. Negative signs include Murphy's sign and McBurney's sign.  Musculoskeletal:     Cervical back: Normal range of motion and neck supple.     Right lower leg: No edema.     Left lower leg: No edema.  Lymphadenopathy:     Cervical: No cervical adenopathy.  Skin:    General: Skin is warm and dry.     Coloration: Skin is not pale.     Findings: No rash.  Neurological:     Mental Status: He is alert.     Coordination: Coordination normal.     Deep Tendon Reflexes: Reflexes are normal and symmetric. Reflexes normal.  Psychiatric:        Mood and Affect: Mood normal.          Assessment & Plan:   Problem List Items Addressed This Visit       Digestive   Gallstones    Recent hospitalization for pancreatitis with increased LFTs but no evidence of CBD stone  Unsure if gallstone related  No current pain and nl exam  Ref made to GI       Relevant Orders   Hepatic function panel (Completed)   Lipase (Completed)   Pancreatitis, acute - Primary    Recent hospitalization at [facility] records, lab results and studies in detail  Clinically improved  Determined idiopathic  Lab for LFT today and lipase  H/o gallstones but no evidence of CBD stone  GI referral done  Relevant Orders   Hepatic function panel (Completed)   Lipase (Completed)   Ambulatory referral to Gastroenterology     Other   Stress reaction    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

## 2021-03-07 NOTE — Telephone Encounter (Signed)
New message:     This patient need a appt with Dr. Caryl Comes

## 2021-03-07 NOTE — Assessment & Plan Note (Signed)
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

## 2021-03-07 NOTE — Assessment & Plan Note (Signed)
Recent hospitalization for pancreatitis with increased LFTs but no evidence of CBD stone  Unsure if gallstone related  No current pain and nl exam  Ref made to GI

## 2021-03-13 ENCOUNTER — Encounter: Payer: Self-pay | Admitting: *Deleted

## 2021-04-01 ENCOUNTER — Telehealth: Payer: Self-pay | Admitting: Internal Medicine

## 2021-04-01 NOTE — Telephone Encounter (Signed)
Spoke with pt again who states he spoke with triage nurse at his PCP office and she referred pt back to cardiology for further advisement r/t COVID exposure and symptoms.   Pt advised will forward information to Dr Caryl Comes for review and recommendation. Reviewed ED precautions with pt. who verbalizes understanding and agrees with current plan.

## 2021-04-01 NOTE — Telephone Encounter (Signed)
Spoke with pt who states he has been exposed to Covid as his daughter has tested positive today.  Pt states his test was negative today but he and his wife are both having symptoms of cough.   Pt advised to contact his PCP for further recommendation.  Pt is somewhat hesitant to this suggestion but verbalizes understanding and agrees with current plan.

## 2021-04-01 NOTE — Telephone Encounter (Signed)
First I have heard about this.  Please call and check in on him to see how he is feeling.

## 2021-04-01 NOTE — Telephone Encounter (Signed)
Patient called in to say that his daughter tested positive to for covid. He is starting have systems. Patient calling to see what he needs to do/so take. Patient has a real bad cough over the phone. Please advise

## 2021-04-02 ENCOUNTER — Ambulatory Visit
Admission: EM | Admit: 2021-04-02 | Discharge: 2021-04-02 | Disposition: A | Discharge status: Home / Self Care | Payer: BC Managed Care – PPO | Attending: Internal Medicine | Admitting: Internal Medicine

## 2021-04-02 ENCOUNTER — Other Ambulatory Visit: Payer: Self-pay

## 2021-04-02 ENCOUNTER — Ambulatory Visit: Payer: Self-pay

## 2021-04-02 DIAGNOSIS — R059 Cough, unspecified: Secondary | ICD-10-CM

## 2021-04-02 DIAGNOSIS — Z20822 Contact with and (suspected) exposure to covid-19: Secondary | ICD-10-CM | POA: Diagnosis not present

## 2021-04-02 MED ORDER — BENZONATATE 200 MG PO CAPS: 200.0000 mg | ORAL_CAPSULE | Freq: Three times a day (TID) | ORAL | 0 refills | Status: DC | PRN

## 2021-04-02 NOTE — ED Triage Notes (Signed)
Pt reports having cough, headache and body aches that began Monday. Covid exposure over the weekend.

## 2021-04-02 NOTE — Telephone Encounter (Signed)
Will route to Triage nurse to review

## 2021-04-02 NOTE — Telephone Encounter (Signed)
[name] - Client TELEPHONE ADVICE RECORD [facility]urse Patient Name: [name] [place] Gender: Male DOB: 06-15-1976 Age: 45 Y 7 D Return Phone Number: [phone] (Primary) Address[place] [address] Client [facility] - Client Client Site [place] [name], [name] - MD Contact Type Call Who Is Calling Patient / Member / Family / Caregiver Call Type Triage / Clinical Relationship To Patient Self Return Phone Number [phone] (Primary) Chief Complaint Headache Reason for Call Symptomatic / Request for [place] 1/2 Caller says he and wife has been exposed to Covid and they are having symptoms of coughing, congestionand headache. Wife has general weakness, and scratchy throat Translation No Nurse Assessment Nurse: Humfleet, RN, Estill Bamberg Date/Time (Eastern Time): 04/01/2021 4:42:21 PM Confirm and document reason for call. If symptomatic, describe symptoms. ---caller states he has had covid exposure. his symptoms started last night. headache, cough, congestion, sore throat. Does the patient have any new or worsening symptoms? ---Yes Will a triage be completed? ---Yes Related visit to physician within the last 2 weeks? ---N/A Does the PT have any chronic conditions? (i.e. diabetes, asthma, this includes High risk factors for pregnancy, etc.) ---Yes List chronic conditions. ---[facility] Is this a behavioral health or substance abuse call? ---No Guidelines Guideline Title Affirmed Question Affirmed Notes Nurse Date/Time (Eastern Time) COVID-19 - Diagnosed or Suspected [1] HIGH RISK for severe COVID complications (e.g., weak immune system, age > 46 years, obesity with BMI > 80, pregnant, chronic lung disease or other Humfleet, RN, Estill Bamberg 04/01/2021 4:42:38 PM PLEASE NOTE: All timestamps contained within this report are  represented as [place] Standard Time. CONFIDENTIALTY NOTICE: This fax transmission is intended only for the addressee. It contains information that is legally privileged, confidential or otherwise protected from use or disclosure. If you are not the intended recipient, you are strictly prohibited from reviewing, disclosing, copying using or disseminating any of this information or taking any action in reliance on or regarding this information. If you have received this fax in error, please notify us immediately by telephone so that we can arrange for its return to [place]. Phone: [phone], Toll-Free: [phone], Fax: [phone] Page: 2 of 2 Call Id: [id number] Guidelines Guideline Title Affirmed Question Affirmed Notes Nurse Date/Time Eilene Ghazi Time) chronic medical condition) AND [2] COVID symptoms (e.g., cough, fever) (Exceptions: Already seen by PCP and no new or worsening symptoms.) Disp. Time Eilene Ghazi Time) Disposition Final User 04/01/2021 4:45:48 PM Call PCP Now Yes Humfleet, RN, Shelly Coss Disagree/Comply Comply Caller Understands Yes PreDisposition Call Doctor Care Advice Given Per Guideline CARE ADVICE given per COVID-19 - DIAGNOSED OR SUSPECTED (Adult) guideline. * You become worse CALL BACK IF: Comments User: Rozelle Logan, RN Date/Time Eilene Ghazi Time): 04/01/2021 4:55:40 PM called back line, no answer, advised patient to call cardiologist

## 2021-04-02 NOTE — Discharge Instructions (Addendum)
We will call you if your covid test is positive, but check on Mychart for your results.

## 2021-04-02 NOTE — Telephone Encounter (Signed)
See note below access nurse note.

## 2021-04-02 NOTE — ED Provider Notes (Signed)
[facility]    CSN: [id number] Arrival date & time: 04/02/21  1013      History   Chief Complaint Chief Complaint  Patient presents with   Cough    HPI [name] is a 45 y.o. male who presents with cough, body aches, HA since yesterday. Was exposed to someone with Covid this weekend. His daughter tested positive yesterday. His at home test yesterday was neg.  Has had a fever up to 101.8. 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[place]) 09/14/2019   Kidney stone 06/13/2019   BPH (benign prostatic hyperplasia) 07/27/2018   Elevated glucose level 04/06/2017   Routine general medical examination at a health care facility 03/28/2016   Hypertriglyceridemia 02/21/2014   Low HDL (under 40) 02/21/2014   Vitamin D deficiency 02/21/2014   Allergic rhinitis 02/21/2014   Erectile dysfunction 08/19/2013   OSA (obstructive sleep apnea) 10/30/2011   Environmental allergies 10/27/2011   High risk medications (not anticoagulants) long-term use 10/27/2011   Hypokalemia 09/29/2011   Arrhythmogenic RV cardiomyopathy 03/04/2011   Implantable cardioverter-defibrillator (ICD) in situ 01/10/2011   PTSD (post-traumatic stress disorder) 2/2 ICD shocks 01/10/2011   Fatty liver 01/04/2009   Gallstones 01/04/2009   GERD 12/05/2008   History of ventricular tachycardia 11/03/2008   ECZEMA 08/17/2007     Past Surgical History:  Procedure Laterality Date   CARDIAC CATHETERIZATION  06-01-06   CARDIAC DEFIBRILLATOR PLACEMENT  06-03-06   Medtronic   ICD GENERATOR CHANGEOUT N/A 11/04/2019   Procedure: ICD [place];  Surgeon: Deboraha Sprang, MD;  Location: [place] [facility];  Service: Cardiovascular;  Laterality: N/A;   IMPLANTABLE CARDIOVERTER DEFIBRILLATOR GENERATOR CHANGE N/A 12/11/2011   Procedure: IMPLANTABLE CARDIOVERTER DEFIBRILLATOR GENERATOR CHANGE;  Surgeon: Deboraha Sprang, MD;  Location: [facility];  Service: Cardiovascular;  Laterality: N/A;   vt ablation  10/10, 10/13   x 2       Home Medications    Prior to Admission medications   Medication Sig Start Date End Date Taking? Authorizing Provider  benzonatate (TESSALON) 200 MG capsule Take 1 capsule (200 mg total) by mouth 3 (three) times daily as needed for cough. 04/02/21  Yes Rodriguez-Southworth, Sunday Spillers, PA-C  Ascorbic Acid (VITAMIN C) 1000 MG tablet Take 1,000 mg by mouth at bedtime.    [provider]  Cetirizine HCl (ZYRTEC ALLERGY PO) Take 1 tablet by mouth as needed (allergies).    [provider]  Cholecalciferol (VITAMIN D3) 50 MCG (2000 UT) TABS Take 2,000 Units by mouth daily.    [provider]  fexofenadine (ALLEGRA) 180 MG tablet Take 180 mg by mouth daily as needed (allergies.).     [provider]  KLOR-CON M10 10 MEQ tablet TAKE 4 TABLETS BY MOUTH TWICE A DAY 05/23/20   [name]  C, MD  magnesium oxide (MAG-OX) 400 MG tablet TAKE 1 TABLET BY MOUTH TWICE A DAY 08/15/20   Deboraha Sprang, MD  montelukast (SINGULAIR) 10 MG tablet TAKE 1 TABLET BY MOUTH EVERY DAY 02/27/21   [name], [name], MD  Multiple Vitamin (MULITIVITAMIN WITH MINERALS) TABS Take 1 tablet by mouth daily.    [provider]  SOTALOL AF 120 MG TABS TAKE 1 & 1/2 TABLETS BY MOUTH TWICE DAILY 05/21/20   Deboraha Sprang, MD  tadalafil (CIALIS) 5 MG tablet Take 5 mg by mouth daily as needed  for erectile dysfunction.    [provider]  vitamin E 180 MG (400 UNITS) capsule Take 400 Units by mouth at bedtime.    [provider]  Zinc 25 MG TABS Take 25 mg by mouth daily.    [provider]    Family History Family History  Problem Relation Age of Onset   Sudden death Father    Asthma Mother    Diabetes Other    Stroke Other    Heart attack Other    Prostate cancer Other    Breast cancer Other    Ovarian cancer Other    Uterine cancer Other    Colon cancer Other    Drug abuse Other    Depression Other    Lung cancer Maternal Grandfather    Heart disease Maternal Grandfather    Heart disease Paternal Grandfather    Hypertension Paternal Grandmother     Social History Social History   Tobacco Use   Smoking status: Former    Packs/day: 1.00    Years: 18.00    Pack years: 18.00    Types: Cigarettes    Start date: 01/17/1990    Quit date: 08/19/2007    Years since quitting: 13.6   Smokeless tobacco: Never  Vaping Use   Vaping Use: Never used  Substance Use Topics   Alcohol use: No    Alcohol/week: 0.0 standard drinks   Drug use: No     Allergies   Bee venom and Lidocaine   Review of Systems Review of Systems  Constitutional:  Positive for chills, diaphoresis, fatigue and fever. Negative for appetite change.  HENT:  Negative for congestion, ear discharge, ear pain, postnasal drip, sore throat and trouble swallowing.   Respiratory:  Positive for cough.   Gastrointestinal:  Positive for nausea. Negative for diarrhea and vomiting.  Musculoskeletal:  Positive for myalgias.  Skin:  Negative for rash.  Neurological:  Positive for headaches.    Physical Exam Triage Vital Signs ED Triage Vitals [04/02/21 1028]  Enc Vitals Group     BP 128/64     Pulse Rate 74     Resp 18     Temp 99.3 F (37.4 C)     Temp Source Oral     SpO2 98 %     Weight 196 lb (88.9 kg)     Height '5\' 9"'$  (1.753 m)     Head Circumference      Peak  Flow      Pain Score 1     Pain Loc      Pain Edu?      Excl. in [place]?    No data found.  Updated Vital Signs BP 128/64   Pulse 74   Temp 99.3 F (37.4 C) (Oral)   Resp 18   Ht '5\' 9"'$  (1.753 m)   Wt 196 lb (88.9 kg)   SpO2 98%  BMI 28.94 kg/m   Visual Acuity Right Eye Distance:   Left Eye Distance:   Bilateral Distance:    Right Eye Near:   Left Eye Near:    Bilateral Near:     Physical Exam  Physical Exam Vitals signs and nursing note reviewed.  Constitutional:      General: She is not in acute distress.    Appearance: Normal appearance. She is not ill-appearing, toxic-appearing or diaphoretic.  HENT:     Head: Normocephalic.     Right Ear: Tympanic membrane, ear canal and external ear normal.     Left Ear: Tympanic membrane, ear canal and external ear normal.     Nose: Nose normal.     Mouth/Throat:     Mouth: Mucous membranes are moist.  Eyes:     General: No scleral icterus.       Right eye: No discharge.        Left eye: No discharge.     Conjunctiva/sclera: Conjunctivae normal.  Neck:     Musculoskeletal: Neck supple. No neck rigidity.  Cardiovascular:     Rate and Rhythm: Normal rate and regular rhythm.     Heart sounds: No murmur.  Pulmonary: has intermittent cough the entire visit    Effort: Pulmonary effort is normal.     Breath sounds: Normal breath sounds.  Abdominal:     General: Bowel sounds are normal. There is no distension.     Palpations: Abdomen is soft. There is no mass.     Tenderness: There is no abdominal tenderness. There is no guarding or rebound.     Hernia: No hernia is present.  Musculoskeletal: Normal range of motion.  Lymphadenopathy:     Cervical: No cervical adenopathy.  Skin:    General: Skin is warm and dry.     Coloration: Skin is not jaundiced.     Findings: No rash.  Neurological:     Mental Status: She is alert and oriented to person, place, and time.     Gait: Gait normal.  Psychiatric:        Mood and  Affect: Mood normal.        Behavior: Behavior normal.        Thought Content: Thought content normal.        Judgment: Judgment normal.   UC Treatments / Results  Labs (all labs ordered are listed, but only abnormal results are displayed) Labs Reviewed  NOVEL CORONAVIRUS, NAA    EKG   Radiology No results found.  Procedures Procedures (including critical care time)  Medications Ordered in UC Medications - No data to display  Initial Impression / Assessment and Plan / UC Course  I have reviewed the triage vital signs and the nursing notes. Pertinent labs results that were available during my care of the patient were reviewed by me and considered in my medical decision making (see chart for details). Suspect covid illness Has chronic conditions so it the covid test is positive, will need antiviral medication. I sent Tessalon Perless for his cough    Final Clinical Impressions(s) / UC Diagnoses   Final diagnoses:  Exposure to COVID-19 virus  Cough     Discharge Instructions      We will call you if your covid test is positive, but check on Mychart for your results.       ED Prescriptions     Medication Sig Dispense Auth. Provider   benzonatate (TESSALON) 200 MG capsule Take 1 capsule (200  mg total) by mouth 3 (three) times daily as needed for cough. 30 capsule Rodriguez-Southworth, Sunday Spillers, PA-C      PDMP not reviewed this encounter.   Shelby Mattocks, PA-C 04/02/21 1105

## 2021-04-02 NOTE — Telephone Encounter (Signed)
I spoke with pt; pt tested on 04/01/21 and was neg with home test. Pts daughter who lives with pt tested + for covid on 04/01/21.  Starting on 03/31/21 with chills, fatigue, fever on 04/01/21 101.4 pt is taking extra strength 500 mg taking 2 tabs and now temp is 99. Pt has body aches, h/a with pain level 7-8 and is constant. Pt has dry and prod cough with clear to yellow green phlegm; SOB with coughing episodes.no CP.heaviness in chest; no wheezing. No diarrhea or vomiting but pt is nauseated. No loss of taste or smell. Scratchy sorethroat and runny nose and sinus congestion. Pt has continuously coughed entire phone call.No available appts at [facility] and I scheduled pt at [facility] in [facility] 04/02/21 at 10:15 AM at pts request. Sending note to Dr [name].

## 2021-04-02 NOTE — Telephone Encounter (Signed)
Aware, will watch for correspondence  

## 2021-04-04 LAB — SARS-COV-2, NAA 2 DAY TAT

## 2021-04-04 LAB — NOVEL CORONAVIRUS, NAA: SARS-CoV-2, NAA: DETECTED — AB

## 2021-04-05 ENCOUNTER — Telehealth (HOSPITAL_COMMUNITY): Payer: Self-pay | Admitting: Emergency Medicine

## 2021-04-05 NOTE — Telephone Encounter (Signed)
Called to review Paxlovid with patient and he informed me that his Cardiologist did not recommend him taking antiviral medication.  States he will reach out about possibly Molnupiravir, but at this time does not want anything sent.

## 2021-04-08 ENCOUNTER — Encounter (HOSPITAL_COMMUNITY): Payer: Self-pay

## 2021-04-08 ENCOUNTER — Other Ambulatory Visit: Payer: Self-pay

## 2021-04-08 ENCOUNTER — Other Ambulatory Visit: Payer: Self-pay | Admitting: Family Medicine

## 2021-04-08 ENCOUNTER — Ambulatory Visit (HOSPITAL_COMMUNITY)
Admission: EM | Admit: 2021-04-08 | Discharge: 2021-04-08 | Disposition: A | Discharge status: Home / Self Care | Payer: BC Managed Care – PPO | Attending: Family Medicine | Admitting: Family Medicine

## 2021-04-08 DIAGNOSIS — M5432 Sciatica, left side: Secondary | ICD-10-CM

## 2021-04-08 DIAGNOSIS — U071 COVID-19: Secondary | ICD-10-CM | POA: Diagnosis not present

## 2021-04-08 DIAGNOSIS — R059 Cough, unspecified: Secondary | ICD-10-CM

## 2021-04-08 MED ORDER — DEXAMETHASONE SODIUM PHOSPHATE 10 MG/ML IJ SOLN
10.0000 mg | Freq: Once | INTRAMUSCULAR | Status: AC
  Administered 2021-04-08: 10 mg via INTRAMUSCULAR

## 2021-04-08 MED ORDER — DEXAMETHASONE SODIUM PHOSPHATE 10 MG/ML IJ SOLN
INTRAMUSCULAR | Status: AC
  Filled 2021-04-08: qty 1

## 2021-04-08 MED ORDER — CYCLOBENZAPRINE HCL 10 MG PO TABS: 10.0000 mg | ORAL_TABLET | Freq: Three times a day (TID) | ORAL | 0 refills | Status: DC | PRN

## 2021-04-08 NOTE — ED Provider Notes (Signed)
[facility]    CSN: [id number] Arrival date & time: 04/08/21  [id number]      History   Chief Complaint Chief Complaint  Patient presents with   Back Pain    HPI [name] is a 45 y.o. male.   Patient presenting today with 3-day history of severe left-sided lateral low back pain radiating down the back of his leg causing numbness to his toe.  He states he was diagnosed with COVID about 7 days ago and has been having a significant wet cough since diagnosis.  He denies any known injury to the back, has been try to take it easy for the last week while he is sick but thinks he may have pulled a muscle coughing.  He states that movement, especially weightbearing makes the pain worse and rest in certain positions makes it a little bit better.  Denies leg swelling, discoloration, bowel or bladder incontinence, saddle paresthesias, fevers, chills.  Has tried multiple over-the-counter pain relievers, tramadol with no relief but Tylenol 3 did help slightly with symptoms.  No past history of similar issue.   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[place]) 09/14/2019   Kidney stone 06/13/2019   BPH (benign prostatic hyperplasia) 07/27/2018   Elevated glucose level 04/06/2017   Routine general medical examination at a health care facility 03/28/2016   Hypertriglyceridemia 02/21/2014   Low HDL (under 40) 02/21/2014   Vitamin D deficiency 02/21/2014    Allergic rhinitis 02/21/2014   Erectile dysfunction 08/19/2013   OSA (obstructive sleep apnea) 10/30/2011   Environmental allergies 10/27/2011   High risk medications (not anticoagulants) long-term use 10/27/2011   Hypokalemia 09/29/2011   Arrhythmogenic RV cardiomyopathy 03/04/2011   Implantable cardioverter-defibrillator (ICD) in situ 01/10/2011   PTSD (post-traumatic stress disorder) 2/2 ICD shocks 01/10/2011   Fatty liver 01/04/2009   Gallstones 01/04/2009   GERD 12/05/2008   History of ventricular tachycardia 11/03/2008   ECZEMA 08/17/2007    Past Surgical History:  Procedure Laterality Date   CARDIAC CATHETERIZATION  06-01-06   CARDIAC DEFIBRILLATOR PLACEMENT  06-03-06   Medtronic   ICD GENERATOR CHANGEOUT N/A 11/04/2019   Procedure: ICD [place];  Surgeon: Deboraha Sprang, MD;  Location: [place] [facility];  Service: Cardiovascular;  Laterality: N/A;   IMPLANTABLE CARDIOVERTER DEFIBRILLATOR GENERATOR CHANGE N/A 12/11/2011   Procedure: IMPLANTABLE CARDIOVERTER DEFIBRILLATOR GENERATOR CHANGE;  Surgeon: Deboraha Sprang, MD;  Location: [facility] CATH [facility];  Service: Cardiovascular;  Laterality: N/A;   vt ablation  10/10, 10/13   x 2       Home Medications    Prior to Admission medications   Medication Sig Start Date End Date Taking? Authorizing Provider  cyclobenzaprine (FLEXERIL) 10 MG tablet Take 1 tablet (10 mg total) by mouth 3 (three) times daily as needed for muscle spasms. Do not drink alcohol or drive while taking this medication.  May cause drowsiness. 04/08/21  Yes Volney American, [place]  Ascorbic Acid (VITAMIN C) 1000 MG tablet Take 1,000 mg by mouth at bedtime.    [provider]  benzonatate (TESSALON) 200 MG capsule Take 1 capsule (200 mg total) by mouth 3 (three) times daily as needed for cough. 04/02/21   Rodriguez-Southworth, Sunday Spillers, PA-C  Cetirizine HCl (ZYRTEC ALLERGY PO) Take 1 tablet by mouth as needed (allergies).    [provider]  Cholecalciferol (VITAMIN D3) 50 MCG (2000 UT) TABS Take 2,000 Units by mouth daily.    [provider]  fexofenadine (ALLEGRA) 180 MG tablet Take 180 mg by mouth daily as needed (allergies.).     [provider]  KLOR-CON M10 10 MEQ tablet TAKE 4 TABLETS BY MOUTH TWICE A DAY 05/23/20   Deboraha Sprang, MD  magnesium oxide (MAG-OX) 400 MG tablet TAKE 1 TABLET BY MOUTH TWICE A DAY 08/15/20   Deboraha Sprang, MD  montelukast (SINGULAIR) 10 MG tablet TAKE 1 TABLET BY MOUTH EVERY DAY 02/27/21   [name], [name], MD  Multiple Vitamin (MULITIVITAMIN WITH MINERALS) TABS Take 1 tablet by mouth daily.    [provider]  SOTALOL AF 120 MG TABS TAKE 1 & 1/2 TABLETS BY MOUTH TWICE DAILY 05/21/20   Deboraha Sprang, MD  tadalafil (CIALIS) 5 MG tablet Take 5 mg by mouth daily as needed for erectile dysfunction.    [provider]  vitamin E 180 MG (400 UNITS) capsule Take 400 Units by mouth at bedtime.    [provider]  Zinc 25 MG TABS Take 25 mg by mouth daily.    [provider]    Family History Family History  Problem Relation Age of Onset   Sudden death Father    Asthma Mother    Diabetes Other    Stroke Other    Heart attack Other    Prostate cancer Other    Breast cancer Other    Ovarian cancer Other    Uterine cancer Other    Colon cancer Other    Drug abuse Other    Depression Other    Lung cancer Maternal Grandfather    Heart disease Maternal Grandfather    Heart disease Paternal Grandfather    Hypertension Paternal Grandmother     Social History Social History   Tobacco Use   Smoking status: Former    Packs/day: 1.00    Years: 18.00    Pack years: 18.00    Types: Cigarettes    Start date: 01/17/1990    Quit date: 08/19/2007    Years since quitting: 13.6   Smokeless tobacco: Never  Vaping Use   Vaping Use: Never used  Substance Use Topics   Alcohol use: No    Alcohol/week: 0.0 standard drinks   Drug use: No      Allergies   Bee venom and Lidocaine   Review of Systems Review of Systems Per HPI  Physical Exam Triage Vital Signs ED Triage Vitals  Enc Vitals Group     BP 04/08/21 1004 118/67     Pulse Rate 04/08/21 1004 65     Resp 04/08/21 1004 18     Temp 04/08/21 1004 98.5 F (36.9 C)     Temp Source 04/08/21 1004 Oral     SpO2 04/08/21 1004 98 %     Weight --      Height --      Head Circumference --      Peak Flow --  Pain Score 04/08/21 1007 10     Pain Loc --      Pain Edu? --      Excl. in [place]? --    No data found.  Updated Vital Signs BP 118/67 (BP Location: Left Arm)   Pulse 65   Temp 98.5 F (36.9 C) (Oral)   Resp 18   SpO2 98%   Visual Acuity Right Eye Distance:   Left Eye Distance:   Bilateral Distance:    Right Eye Near:   Left Eye Near:    Bilateral Near:     Physical Exam Vitals and nursing note reviewed.  Constitutional:      Appearance: Normal appearance.  HENT:     Head: Atraumatic.     Mouth/Throat:     Mouth: Mucous membranes are moist.  Eyes:     Extraocular Movements: Extraocular movements intact.     Conjunctiva/sclera: Conjunctivae normal.  Cardiovascular:     Rate and Rhythm: Normal rate and regular rhythm.  Pulmonary:     Effort: Pulmonary effort is normal. No respiratory distress.     Breath sounds: Normal breath sounds. No wheezing or rales.  Abdominal:     General: Bowel sounds are normal. There is no distension.     Palpations: Abdomen is soft.     Tenderness: There is no abdominal tenderness. There is no guarding.  Musculoskeletal:        General: Tenderness present. No swelling, deformity or signs of injury. Normal range of motion.     Cervical back: Normal range of motion and neck supple.     Comments: left lateral lumbar musculature tender to palpation. Negative straight leg raise bilateral lower extremities.  Normal gait.  Good range of motion at L-spine in all directions No midline spinal tenderness palpation  diffusely  Skin:    General: Skin is warm and dry.     Findings: No erythema.  Neurological:     General: No focal deficit present.     Mental Status: He is oriented to person, place, and time.     Motor: No weakness.     Gait: Gait normal.     Comments: Bilateral lower extremities neurovascularly intact  Psychiatric:        Mood and Affect: Mood normal.        Thought Content: Thought content normal.        Judgment: Judgment normal.     UC Treatments / Results  Labs (all labs ordered are listed, but only abnormal results are displayed) Labs Reviewed - No data to display  EKG   Radiology No results found.  Procedures Procedures (including critical care time)  Medications Ordered in UC Medications  dexamethasone (DECADRON) injection 10 mg (10 mg Intramuscular Given 04/08/21 1057)    Initial Impression / Assessment and Plan / UC Course  I have reviewed the triage vital signs and the nursing notes.  Pertinent labs & imaging results that were available during my care of the patient were reviewed by me and considered in my medical decision making (see chart for details).     O2 saturation 98% on room air, lungs clear to auscultation bilaterally and appears to be overall resolving well from COVID-19 infection 1 week ago.  Suspect left-sided sciatica to be causing his back pain radiating down the leg.  We will treat with IM Decadron, Flexeril, stretches, rest.  Discussed strict return precautions if acutely worsening symptoms.  Work note given.  Final Clinical Impressions(s) / UC  Diagnoses   Final diagnoses:  Left sided sciatica  COVID-19  Cough   Discharge Instructions   None    ED Prescriptions     Medication Sig Dispense Auth. Provider   cyclobenzaprine (FLEXERIL) 10 MG tablet Take 1 tablet (10 mg total) by mouth 3 (three) times daily as needed for muscle spasms. Do not drink alcohol or drive while taking this medication.  May cause drowsiness. 15 tablet Volney American, [place]      PDMP not reviewed this encounter.   Volney American, [place] 04/08/21 1130

## 2021-04-08 NOTE — ED Triage Notes (Signed)
Pt presents with severe back pain on left side that radiates down left leg X 3 days that has been unrelieved with OTC medication.  Pt is covid positive.

## 2021-04-09 ENCOUNTER — Telehealth (HOSPITAL_COMMUNITY): Payer: Self-pay

## 2021-04-09 MED ORDER — METHOCARBAMOL 500 MG PO TABS
500.0000 mg | ORAL_TABLET | Freq: Three times a day (TID) | ORAL | 0 refills | Status: DC
Start: 2021-04-09 — End: 2021-05-20

## 2021-04-09 NOTE — Telephone Encounter (Signed)
It looks like he sent a note to Dr Caryl Comes re: whether he could take flexeril with sotalol   I can send in a different muscle relaxer if he needs it.  I did not see an interaction with robaxin or skelaxin but will double check if I send it  How are his symptoms ? (Back pain)

## 2021-04-09 NOTE — Telephone Encounter (Signed)
Pt was call and notified cyclobenzaprine will be change for Robaxin as recommended suggested by the Pharmacist due to  some risk of QTc prolongation when cyclobenzaprine and sotalol are used together. Pt verbalized understanding.

## 2021-04-09 NOTE — Telephone Encounter (Signed)
Received message from patient requesting change from cyclobenzaprine to Robaxin given history of arrhythmia and current usage of sotalol.  New prescription for Robaxin was sent to pharmacy as requested.

## 2021-04-09 NOTE — Telephone Encounter (Signed)
Med was prescribe at [facility] yesterday see pharmacy warnings on med:  comment: Script Clarification:PATIENT HAS [place]. CYCLOBENZAPRINE CONTRAINDICATED WITH ARRHYTHYMIA HX. DO YOU WANT [place] TO CONTINUE TO FILL OR TRY ALTERNATIVE? THANKS!

## 2021-04-09 NOTE — Telephone Encounter (Signed)
Received message from patient requesting switch from cyclobenzaprine to Robaxin due to potential drug interaction.  Patient has a history of arrhythmia and was recommended he switch to Robaxin as this would not interfere with sotalol.  New prescription for Robaxin sent to pharmacy.

## 2021-04-10 ENCOUNTER — Encounter (HOSPITAL_COMMUNITY): Payer: Self-pay | Admitting: Emergency Medicine

## 2021-04-10 ENCOUNTER — Other Ambulatory Visit: Payer: Self-pay

## 2021-04-10 ENCOUNTER — Telehealth: Payer: Self-pay

## 2021-04-10 ENCOUNTER — Emergency Department (HOSPITAL_COMMUNITY): Payer: BC Managed Care – PPO

## 2021-04-10 ENCOUNTER — Emergency Department (HOSPITAL_COMMUNITY)
Admission: EM | Admit: 2021-04-10 | Discharge: 2021-04-10 | Disposition: A | Discharge status: Home / Self Care | Payer: BC Managed Care – PPO | Attending: Emergency Medicine | Admitting: Emergency Medicine

## 2021-04-10 DIAGNOSIS — Z87891 Personal history of nicotine dependence: Secondary | ICD-10-CM | POA: Diagnosis not present

## 2021-04-10 DIAGNOSIS — M5442 Lumbago with sciatica, left side: Secondary | ICD-10-CM

## 2021-04-10 DIAGNOSIS — J45909 Unspecified asthma, uncomplicated: Secondary | ICD-10-CM | POA: Insufficient documentation

## 2021-04-10 DIAGNOSIS — U071 COVID-19: Secondary | ICD-10-CM | POA: Insufficient documentation

## 2021-04-10 DIAGNOSIS — M545 Low back pain, unspecified: Secondary | ICD-10-CM | POA: Diagnosis present

## 2021-04-10 MED ORDER — OXYCODONE-ACETAMINOPHEN 5-325 MG PO TABS: 1.0000 | ORAL_TABLET | Freq: Three times a day (TID) | ORAL | 0 refills | Status: DC | PRN

## 2021-04-10 MED ORDER — PREDNISONE 10 MG (21) PO TBPK: ORAL_TABLET | Freq: Every day | ORAL | 0 refills | Status: DC

## 2021-04-10 MED ORDER — HYDROMORPHONE HCL 1 MG/ML IJ SOLN
0.5000 mg | Freq: Once | INTRAMUSCULAR | Status: AC
  Administered 2021-04-10: 0.5 mg via INTRAMUSCULAR
  Filled 2021-04-10: qty 1

## 2021-04-10 MED ORDER — HYDROMORPHONE HCL 1 MG/ML IJ SOLN
0.5000 mg | Freq: Once | INTRAMUSCULAR | Status: DC
Start: 2021-04-10 — End: 2021-04-10

## 2021-04-10 NOTE — Discharge Instructions (Addendum)
  You were evaluated in the Emergency Department and after careful evaluation, we did not find any emergent condition requiring admission or further testing in the hospital.   Your exam/testing today was overall reassuring.  Symptoms seem to be due to sciatica.  As we discussed when she take naproxen, please use the attached instructions, when the pain is severe you can take Percocet.  Do note this is a narcotic, only take this as necessary.  Do not drink alcohol or drive when taking this medication.  I also prescribed you a steroid Dosepak, to discuss please be sure cardiologist before taking this medication.  This can increase your blood sugar and cause other side effects, please talk to pharmacist about these.  Please follow-up with either orthopedics or the neuro spine specialist.  Please return to the Emergency Department if you experience any worsening of your condition.  Thank you for allowing [place] to be a part of your care. Please speak to your pharmacist about any new medications prescribed today in regards to side effects or interactions with other medications.    Get help right away if: You cannot control when you pee (urinate) or poop (have a bowel movement). You have weakness in any of these areas and it gets worse: Lower back. The area between your hip bones. Butt. Legs. You have redness or swelling of your back. You have a burning feeling when you pee.

## 2021-04-10 NOTE — ED Notes (Signed)
Pt transported to Xray. 

## 2021-04-10 NOTE — ED Notes (Signed)
Pt ambulating with steady gait without assistance to bathroom. No limping noted.

## 2021-04-10 NOTE — Telephone Encounter (Signed)
It looks like he is in the ER now

## 2021-04-10 NOTE — Telephone Encounter (Signed)
Pt said having severe pain in lower back; worse on lt side but rt hurts also; pain level 8 and was seen at [facility] on Monday; given steroid shot and muscle relaxant and is not helping the pain. Pt also had tylenol 3 from something previously and that did not help the pain also.Pt said in v/m that something is wrong; cannot get relief from pain; pt can hardly walk. Pt said has weakness in lt leg and difficult to walk; shooting pain down into toes when trying to walk. Pt said UC advised sciatica but treatment is not helping. Pt said he is hurting so badly he cannot pick up his toddler. Pt wonders if could have herniated disc; pt knows something is not right.  Pt will go to ED for eval and pain level and possible xrays or imaging. Pt will have someone take him to ED;sending note to DR [employer] and [place] CMA.

## 2021-04-10 NOTE — ED Provider Notes (Signed)
[place] EMERGENCY DEPARTMENT Provider Note   CSN: [id number] Arrival date & time: 04/10/21  1057     History No chief complaint on file.   [name] is a 45 y.o. 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  Patient has had back pain for the past 5 days, states it is on his left side of his back and radiates down into his toe.  Patient states that he denies any trauma to the back, however was diagnosed with COVID about 9 days ago and has been significantly coughing and feels as if he pulled a muscle while coughing.  Patient states that he also works in a physically demanding job and he is constantly lifting.  Patient was seen at urgent care 2 days ago and was given a muscle relaxant and Decadron, p patient states that it helped for that day, over the pain became severe again.  Denies any fevers, IV drug use, saddle paresthesias, weakness, gait abnormality, bladder or bowel incontinence or retention.  Denies any anal pain.  Patient states that he is also tried multiple over-the-counter pain relievers, tramadol and Percocet without relief.  Patient states that it is difficult to walk due to the pain..  Patient states that when he is at rest the pain is just in his back, whenever he ambulates the pain will go down to toe.  Denies any chest pain, shortness of breath, nausea, vomiting, abdominal pain.  No other complaints at this time.  Denies any previous back surgeries. 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) 09/14/2019   Kidney stone 06/13/2019   BPH (benign prostatic hyperplasia) 07/27/2018   Elevated glucose level 04/06/2017   Routine general medical examination at a health care facility 03/28/2016   Hypertriglyceridemia 02/21/2014   Low HDL (under 40) 02/21/2014   Vitamin D deficiency 02/21/2014   Allergic rhinitis 02/21/2014   Erectile dysfunction 08/19/2013   OSA (obstructive sleep apnea) 10/30/2011   Environmental allergies 10/27/2011   High risk medications (not anticoagulants) long-term use 10/27/2011   Hypokalemia 09/29/2011   Arrhythmogenic RV cardiomyopathy 03/04/2011   Implantable cardioverter-defibrillator (ICD) in situ 01/10/2011   PTSD (post-traumatic stress disorder) 2/2 ICD shocks 01/10/2011   Fatty liver 01/04/2009   Gallstones 01/04/2009   GERD 12/05/2008   History of ventricular tachycardia 11/03/2008   ECZEMA 08/17/2007    Past Surgical History:  Procedure Laterality Date   CARDIAC CATHETERIZATION  06-01-06   CARDIAC DEFIBRILLATOR PLACEMENT  06-03-06   Medtronic   ICD GENERATOR CHANGEOUT N/A 11/04/2019   Procedure: ICD [place];  Surgeon: Deboraha Sprang, MD;  Location: [facility];  Service: Cardiovascular;  Laterality: N/A;   IMPLANTABLE CARDIOVERTER DEFIBRILLATOR GENERATOR CHANGE N/A 12/11/2011   Procedure: IMPLANTABLE CARDIOVERTER DEFIBRILLATOR GENERATOR CHANGE;  Surgeon: Deboraha Sprang,  MD;  Location: [facility] CATH LAB;  Service: Cardiovascular;  Laterality: N/A;   vt ablation  10/10, 10/13   x 2       Family History  Problem Relation Age of Onset   Sudden death Father    Asthma Mother    Diabetes Other    Stroke Other    Heart attack Other    Prostate cancer Other    Breast cancer Other    Ovarian cancer Other    Uterine cancer Other    Colon cancer Other     Drug abuse Other    Depression Other    Lung cancer Maternal Grandfather    Heart disease Maternal Grandfather    Heart disease Paternal Grandfather    Hypertension Paternal Grandmother     Social History   Tobacco Use   Smoking status: Former    Packs/day: 1.00    Years: 18.00    Pack years: 18.00    Types: Cigarettes    Start date: 01/17/1990    Quit date: 08/19/2007    Years since quitting: 13.6   Smokeless tobacco: Never  Vaping Use   Vaping Use: Never used  Substance Use Topics   Alcohol use: No    Alcohol/week: 0.0 standard drinks   Drug use: No    Home Medications Prior to Admission medications   Medication Sig Start Date End Date Taking? Authorizing Provider  oxyCODONE-acetaminophen (PERCOCET/ROXICET) 5-325 MG tablet Take 1 tablet by mouth every 8 (eight) hours as needed for severe pain. 04/10/21  Yes Kyuss Hale, PA-C  predniSONE (STERAPRED UNI-PAK 21 TAB) 10 MG (21) TBPK tablet Take by mouth daily. Take as directed 04/10/21  Yes Alfredia Client, PA-C  Ascorbic Acid (VITAMIN C) 1000 MG tablet Take 1,000 mg by mouth at bedtime.    [provider]  benzonatate (TESSALON) 200 MG capsule Take 1 capsule (200 mg total) by mouth 3 (three) times daily as needed for cough. 04/02/21   Rodriguez-Southworth, Sunday Spillers, PA-C  Cetirizine HCl (ZYRTEC ALLERGY PO) Take 1 tablet by mouth as needed (allergies).    [provider]  Cholecalciferol (VITAMIN D3) 50 MCG (2000 UT) TABS Take 2,000 Units by mouth daily.    [provider]  cyclobenzaprine (FLEXERIL) 10 MG tablet Take 1 tablet (10 mg total) by mouth 3 (three) times daily as needed for muscle spasms. Do not drink alcohol or drive while taking this medication.  May cause drowsiness. 04/08/21   Volney American, PA-C  fexofenadine (ALLEGRA) 180 MG tablet Take 180 mg by mouth daily as needed (allergies.).     [provider]  KLOR-CON M10 10 MEQ tablet TAKE 4 TABLETS BY MOUTH TWICE A DAY 05/23/20    Deboraha Sprang, MD  magnesium oxide (MAG-OX) 400 MG tablet TAKE 1 TABLET BY MOUTH TWICE A DAY 08/15/20   Deboraha Sprang, MD  methocarbamol (ROBAXIN) 500 MG tablet Take 1 tablet (500 mg total) by mouth 3 (three) times daily. 04/09/21   Raspet, Junie Panning K, PA-C  montelukast (SINGULAIR) 10 MG tablet TAKE 1 TABLET BY MOUTH EVERY DAY 02/27/21   [name], [name], MD  Multiple Vitamin (MULITIVITAMIN WITH MINERALS) TABS Take 1 tablet by mouth daily.    [provider]  SOTALOL AF 120 MG TABS TAKE 1 & 1/2 TABLETS BY MOUTH TWICE DAILY 05/21/20   Deboraha Sprang, MD  tadalafil (CIALIS) 5 MG tablet Take 5 mg by mouth daily as needed for erectile dysfunction.    [provider]  vitamin E 180 MG (400 UNITS) capsule Take 400 Units by mouth at bedtime.    [provider]  Zinc 25 MG TABS Take 25 mg by mouth daily.    [provider]    Allergies    Bee venom and Lidocaine  Review of Systems   Review of Systems  Constitutional:  Negative for chills, diaphoresis, fatigue and fever.  HENT:  Negative for congestion, sore throat and trouble swallowing.   Eyes:  Negative for pain and visual disturbance.  Respiratory:  Negative for cough, shortness of breath and wheezing.   Cardiovascular:  Negative for chest pain, palpitations and leg swelling.  Gastrointestinal:  Negative for abdominal distention, abdominal pain, diarrhea, nausea and vomiting.  Genitourinary:  Negative for difficulty urinating.  Musculoskeletal:  Positive for back pain. Negative for neck pain and neck stiffness.  Skin:  Negative for pallor.  Neurological:  Negative for dizziness, speech difficulty, weakness and headaches.  Psychiatric/Behavioral:  Negative for confusion.    Physical Exam Updated Vital Signs BP 123/74   Pulse 60   Temp 98.1 F (36.7 C)   Resp 18   SpO2 99%   Physical Exam Constitutional:      General: He is not in acute distress.    Appearance: Normal appearance. He is not  ill-appearing, toxic-appearing or diaphoretic.  HENT:     Mouth/Throat:     Mouth: Mucous membranes are moist.     Pharynx: Oropharynx is clear.  Eyes:     General: No scleral icterus.    Extraocular Movements: Extraocular movements intact.     Pupils: Pupils are equal, round, and reactive to light.  Cardiovascular:     Rate and Rhythm: Normal rate and regular rhythm.     Pulses: Normal pulses.     Heart sounds: Normal heart sounds.  Pulmonary:     Effort: Pulmonary effort is normal. No respiratory distress.     Breath sounds: Normal breath sounds. No stridor. No wheezing, rhonchi or rales.  Chest:     Chest wall: No tenderness.  Abdominal:     General: Abdomen is flat. There is no distension.     Palpations: Abdomen is soft.     Tenderness: There is no abdominal tenderness. There is no guarding or rebound.  Musculoskeletal:        General: No swelling or tenderness. Normal range of motion.     Cervical back: Normal range of motion and neck supple. No rigidity.       Back:     Right lower leg: No edema.     Left lower leg: No edema.     Comments: Patient with tenderness to lumbar region as depicted in graphic.  No erythema, no midline tenderness.  Normal range of motion to hip, negative leg raise.  Compartments are soft.  DP pulses by Doppler are 2+.  Skin:    General: Skin is warm and dry.     Capillary Refill: Capillary refill takes less than 2 seconds.     Coloration: Skin is not pale.  Neurological:     General: No focal deficit present.     Mental Status: He is alert and oriented to person, place, and time.     Cranial Nerves: No cranial nerve deficit.     Sensory: No sensory deficit.     Motor: No weakness.     Coordination: Coordination normal.     Gait: Gait normal.  Psychiatric:  Mood and Affect: Mood normal.        Behavior: Behavior normal.    ED Results / Procedures / Treatments   Labs (all labs ordered are listed, but only abnormal results are  displayed) Labs Reviewed - No data to display  EKG None  Radiology DG Lumbar Spine Complete  Result Date: 04/10/2021 CLINICAL DATA:  Left low back pain beginning last week after jumping out of bed quickly. EXAM: LUMBAR SPINE - COMPLETE 4+ VIEW COMPARISON:  None. FINDINGS: There are 5 non rib-bearing lumbar type vertebrae. Vertebral alignment is normal. No fracture is identified. There is mild disc space narrowing at L4-5. No significant facet arthropathy is evident. IMPRESSION: No acute osseous abnormality identified. Mild disc degeneration at L4-5. Electronically Signed   By: Logan Bores M.D.   On: 04/10/2021 13:09    Procedures Procedures   Medications Ordered in ED Medications  HYDROmorphone (DILAUDID) injection 0.5 mg (0.5 mg Intramuscular Given 04/10/21 1647)    ED Course  I have reviewed the triage vital signs and the nursing notes.  Pertinent labs & imaging results that were available during my care of the patient were reviewed by me and considered in my medical decision making (see chart for details).    MDM Rules/Calculators/A&P                          KASIM FRATTINI is a 45 y.o. 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  No trauma to the back, no red flag back symptoms.  Appears sciatic in nature, patient does not have any midline tenderness.  Patient is distally neurovascularly intact and can ambulate normally.  Patient appears as if he is in a lot of pain, will give Dilaudid and reassess.  After pain medication, patient is able to ambulate, states that he feels better.  Did discuss options for treatment including steroids, side effects discussed, patient wants to proceed with this.  Patient states he will ask his cardiologist before taking this.  Patient will follow up with orthopedics, symptomatic treatment discussed.  Patient to be discharged, normal neuro exam and is ambulatory. Doubt  need for further emergent work up at this time. I explained the diagnosis and have given explicit precautions to return to the ER including for any other new or worsening symptoms. The patient understands and accepts the medical plan as it's been dictated and I have answered their questions. Discharge instructions concerning home care and prescriptions have been given. The patient is STABLE and is discharged to home in good condition.  Final Clinical Impression(s) / ED Diagnoses Final diagnoses:  Acute left-sided low back pain with left-sided sciatica    Rx / DC Orders ED Discharge Orders          Ordered    predniSONE (STERAPRED UNI-PAK 21 TAB) 10 MG (21) TBPK tablet  Daily        04/10/21 1731    oxyCODONE-acetaminophen (PERCOCET/ROXICET) 5-325 MG tablet  Every 8 hours PRN        04/10/21 1731             Alfredia Client, PA-C 04/10/21 1805    Valarie Merino, MD 04/12/21 [phone]

## 2021-04-10 NOTE — Telephone Encounter (Signed)
Thanks for letting me know, agree with that advisement May need MRI (certainly xray)  Aware, will watch for correspondence   Routing back to myself to check on him later

## 2021-04-10 NOTE — ED Triage Notes (Signed)
Pt here from home with c/o low back pain , had muscle relaxer but not helping

## 2021-04-17 ENCOUNTER — Other Ambulatory Visit (HOSPITAL_COMMUNITY): Payer: Self-pay | Admitting: Neurological Surgery

## 2021-04-17 DIAGNOSIS — M5416 Radiculopathy, lumbar region: Secondary | ICD-10-CM

## 2021-04-24 ENCOUNTER — Other Ambulatory Visit: Payer: Self-pay | Admitting: Neurological Surgery

## 2021-04-24 DIAGNOSIS — M5416 Radiculopathy, lumbar region: Secondary | ICD-10-CM

## 2021-04-25 ENCOUNTER — Inpatient Hospital Stay
Admission: RE | Admit: 2021-04-25 | Discharge: 2021-04-25 | Disposition: A | Discharge status: Home / Self Care | Payer: BC Managed Care – PPO | Source: Ambulatory Visit | Attending: Neurological Surgery | Admitting: Neurological Surgery

## 2021-04-25 ENCOUNTER — Other Ambulatory Visit: Payer: BC Managed Care – PPO

## 2021-04-25 NOTE — Discharge Instructions (Signed)

## 2021-04-30 ENCOUNTER — Ambulatory Visit (HOSPITAL_COMMUNITY)
Admission: RE | Admit: 2021-04-30 | Discharge: 2021-04-30 | Disposition: A | Discharge status: Home / Self Care | Payer: BC Managed Care – PPO | Source: Ambulatory Visit | Attending: Neurological Surgery | Admitting: Neurological Surgery

## 2021-04-30 ENCOUNTER — Other Ambulatory Visit: Payer: Self-pay

## 2021-04-30 DIAGNOSIS — M5416 Radiculopathy, lumbar region: Secondary | ICD-10-CM | POA: Insufficient documentation

## 2021-04-30 NOTE — Progress Notes (Signed)
Per order, changed device settings for MRI to   OVO  MRI mode/Tachy-therapies to off  Will program device back to pre-MRI settings after completion of exam, and send transmission

## 2021-05-06 ENCOUNTER — Ambulatory Visit (INDEPENDENT_AMBULATORY_CARE_PROVIDER_SITE_OTHER): Payer: BC Managed Care – PPO

## 2021-05-06 ENCOUNTER — Other Ambulatory Visit: Payer: Self-pay | Admitting: Neurological Surgery

## 2021-05-06 DIAGNOSIS — S32009S Unspecified fracture of unspecified lumbar vertebra, sequela: Secondary | ICD-10-CM

## 2021-05-06 DIAGNOSIS — I472 Ventricular tachycardia, unspecified: Secondary | ICD-10-CM

## 2021-05-06 LAB — CUP PACEART REMOTE DEVICE CHECK
Battery Remaining Longevity: 129 mo
Battery Voltage: 3.03 V
Brady Statistic RV Percent Paced: 0.33 %
Date Time Interrogation Session: 20220919192307
HighPow Impedance: 100 Ohm
HighPow Impedance: 56 Ohm
Implantable Lead Implant Date: 20071018
Implantable Lead Location: 753860
Implantable Lead Model: 6947
Implantable Pulse Generator Implant Date: 20210319
Lead Channel Impedance Value: 399 Ohm
Lead Channel Impedance Value: 456 Ohm
Lead Channel Pacing Threshold Amplitude: 0.625 V
Lead Channel Pacing Threshold Pulse Width: 0.4 ms
Lead Channel Sensing Intrinsic Amplitude: 13.625 mV
Lead Channel Sensing Intrinsic Amplitude: 13.625 mV
Lead Channel Setting Pacing Amplitude: 2 V
Lead Channel Setting Pacing Pulse Width: 0.4 ms
Lead Channel Setting Sensing Sensitivity: 1.2 mV

## 2021-05-09 ENCOUNTER — Other Ambulatory Visit: Payer: Self-pay | Admitting: Neurological Surgery

## 2021-05-09 ENCOUNTER — Telehealth: Payer: Self-pay | Admitting: *Deleted

## 2021-05-09 NOTE — Telephone Encounter (Addendum)
    Patient Name: [name]  DOB: 1976-02-02 MRN: [id number]  Primary Cardiologist: [name], MD  Chart reviewed as part of pre-operative protocol coverage. Given past medical history and time since last visit, based on ACC/AHA guidelines, [name] would be at acceptable risk for the planned procedure without further cardiovascular testing.   The patient was advised that if he develops new symptoms prior to surgery to contact our office to arrange for a follow-up visit, and he verbalized understanding.  Given hx of VT, monitor patient on monitor during peri-operative period.   I will route this recommendation to the requesting party via Epic fax function and remove from pre-op pool.  Please call with questions.  [place], [place] 05/09/2021, 3:14 PM

## 2021-05-09 NOTE — Telephone Encounter (Signed)
   [facility] Pre-operative Risk Assessment    Patient Name: [name]  DOB: 1975-09-19 MRN: [id number]  HEARTCARE STAFF:  - IMPORTANT!!!!!! Under Visit Info/Reason for Call, type in Other and utilize the format Clearance MM/DD/YY or Clearance TBD. Do not use dashes or single digits. - Please review there is not already an duplicate clearance open for this procedure. - If request is for dental extraction, please clarify the # of teeth to be extracted. - If the patient is currently at the dentist's office, call Pre-Op Callback Staff (MA/nurse) to input urgent request.  - If the patient is not currently in the dentist office, please route to the Pre-Op pool.  Request for surgical clearance:  What type of surgery is being performed? L4-5 MINIMALLY INVASIVE LAMINECTOMY w/L5 VERTEBROPLASTY  When is this surgery scheduled? 05/14/21  What type of clearance is required (medical clearance vs. Pharmacy clearance to hold med vs. Both)? MEDICAL  Are there any medications that need to be held prior to surgery and how long? NONE LISTED   Practice name and name of physician performing surgery? [facility]; DR. Emelda Brothers  What is the office phone number? [phone]   7.   What is the office fax number? [phone] ATN: JESSICA  8.   Anesthesia type (None, local, MAC, general) ? GENERAL   Julaine Hua 05/09/2021, 1:00 PM  _________________________________________________________________   (provider comments below)

## 2021-05-10 ENCOUNTER — Encounter: Payer: Self-pay | Admitting: Internal Medicine

## 2021-05-10 NOTE — Pre-Procedure Instructions (Signed)
Surgical Instructions   Your procedure is scheduled on Tuesday, September 27th. Report to [facility] Main Entrance "A" at 10:00 A.M., then check in with the Admitting office. Call this number if you have problems the morning of surgery: [phone]   If you have any questions prior to your surgery date call [phone]: Open Monday-Friday 8am-4pm   Remember: Do not eat after midnight the night before your surgery  You may drink clear liquids until 09:00 AM the morning of your surgery.   Clear liquids allowed are: Water, Non-Citrus Juices (without pulp), Carbonated Beverages, Clear Tea, Black Coffee Only, and Gatorade   Take these medicines the morning of surgery with A SIP OF WATER  cetirizine (ZYRTEC)  gabapentin (NEURONTIN) montelukast (SINGULAIR) SOTALOL AF   If needed: methocarbamol (ROBAXIN) oxyCODONE-acetaminophen (PERCOCET/ROXICET)  As of today, STOP taking any Aspirin (unless otherwise instructed by your surgeon) Aleve, Naproxen, Ibuprofen, Motrin, Advil, Goody's, BC's, all herbal medications, fish oil, and all vitamins.                     Do NOT Smoke (Tobacco/Vaping) or drink Alcohol 24 hours prior to your procedure.  If you use a CPAP at night, you may bring all equipment for your overnight stay.   Contacts, glasses, piercing's, hearing aid's, dentures or partials may not be worn into surgery, please bring cases for these belongings.    For patients admitted to the hospital, discharge time will be determined by your treatment team.   Patients discharged the day of surgery will not be allowed to drive home, and someone needs to stay with them for 24 hours.  ONLY 1 SUPPORT PERSON MAY BE PRESENT WHILE YOU ARE IN SURGERY. IF YOU ARE TO BE ADMITTED ONCE YOU ARE IN YOUR ROOM YOU WILL BE ALLOWED TWO (2) VISITORS.  Minor children may have two parents present. Special consideration for safety and communication needs will be reviewed on a case by case basis.   Special  instructions:   [facility]- Preparing For Surgery  Before surgery, you can play an important role. Because skin is not sterile, your skin needs to be as free of germs as possible. You can reduce the number of germs on your skin by washing with CHG (chlorahexidine gluconate) Soap before surgery.  CHG is an antiseptic cleaner which kills germs and bonds with the skin to continue killing germs even after washing.    Oral Hygiene is also important to reduce your risk of infection.  Remember - BRUSH YOUR TEETH THE MORNING OF SURGERY WITH YOUR REGULAR TOOTHPASTE  Please do not use if you have an allergy to CHG or antibacterial soaps. If your skin becomes reddened/irritated stop using the CHG.  Do not shave (including legs and underarms) for at least 48 hours prior to first CHG shower. It is OK to shave your face.  Please follow these instructions carefully.   Shower the NIGHT BEFORE SURGERY and the MORNING OF SURGERY  If you chose to wash your hair, wash your hair first as usual with your normal shampoo.  After you shampoo, rinse your hair and body thoroughly to remove the shampoo.  Use CHG Soap as you would any other liquid soap. You can apply CHG directly to the skin and wash gently with a scrungie or a clean washcloth.   Apply the CHG Soap to your body ONLY FROM THE NECK DOWN.  Do not use on open wounds or open sores. Avoid contact with your eyes, ears,  mouth and genitals (private parts). Wash Face and genitals (private parts)  with your normal soap.   Wash thoroughly, paying special attention to the area where your surgery will be performed.  Thoroughly rinse your body with warm water from the neck down.  DO NOT shower/wash with your normal soap after using and rinsing off the CHG Soap.  Pat yourself dry with a CLEAN TOWEL.  Wear CLEAN PAJAMAS to bed the night before surgery  Place CLEAN SHEETS on your bed the night before your surgery  DO NOT SLEEP WITH PETS.   Day of  Surgery: Shower with CHG soap. Do not wear jewelry Do not wear lotions, powders, colognes, or deodorant. Men may shave face and neck. Do not bring valuables to the hospital. [facility] is not responsible for any belongings or valuables. Wear Clean/Comfortable clothing the morning of surgery Remember to brush your teeth WITH YOUR REGULAR TOOTHPASTE.   Please read over the following fact sheets that you were given.

## 2021-05-10 NOTE — Progress Notes (Signed)
Emailed device clinic requesting orders concerning upcoming procedure 05/14/21.

## 2021-05-10 NOTE — Progress Notes (Signed)
[facility] PROGRAMMING  Patient Information: Name:  DERIC BOCOCK  DOB:  06/24/1976  MRN:  [id number]    Planned Procedure:   Lumbar 4-5 Minimally invasive laminectomy with Lumbar 5 vertebroplasty  Surgeon:  Dr. Emelda Brothers  Date of Procedure:  05/14/21   Please send documentation back to:  [facility] (Fax # [phone])  Device Information:  Clinic EP Physician:  [name], MD   Device Type:  Defibrillator Manufacturer and Phone #:  Medtronic: [id number] Pacemaker Dependent?:  No. Date of Last Device Check:  05/06/21 (Remote) Normal Device Function?:  Yes.    Electrophysiologist's Recommendations:  Have magnet available. Provide continuous ECG monitoring when magnet is used or reprogramming is to be performed.  Procedure may interfere with device function.  Magnet should be placed over device during procedure.  Per Device Clinic Standing Orders, Simone Curia, RN  4:10 PM 05/10/2021

## 2021-05-10 NOTE — Progress Notes (Signed)
Emailed [employer] rep [place] to notify about patient's upcoming surgery.

## 2021-05-13 ENCOUNTER — Other Ambulatory Visit: Payer: Self-pay

## 2021-05-13 ENCOUNTER — Encounter (HOSPITAL_COMMUNITY)
Admission: RE | Admit: 2021-05-13 | Discharge: 2021-05-13 | Disposition: A | Discharge status: Home / Self Care | Payer: BC Managed Care – PPO | Source: Ambulatory Visit | Attending: Neurological Surgery | Admitting: Neurological Surgery

## 2021-05-13 ENCOUNTER — Encounter (HOSPITAL_COMMUNITY): Payer: Self-pay

## 2021-05-13 DIAGNOSIS — G4733 Obstructive sleep apnea (adult) (pediatric): Secondary | ICD-10-CM | POA: Diagnosis not present

## 2021-05-13 DIAGNOSIS — Z95810 Presence of automatic (implantable) cardiac defibrillator: Secondary | ICD-10-CM | POA: Diagnosis not present

## 2021-05-13 DIAGNOSIS — I428 Other cardiomyopathies: Secondary | ICD-10-CM | POA: Insufficient documentation

## 2021-05-13 DIAGNOSIS — Z01812 Encounter for preprocedural laboratory examination: Secondary | ICD-10-CM | POA: Insufficient documentation

## 2021-05-13 LAB — CBC
HCT: 43.4 % (ref 39.0–52.0)
Hemoglobin: 14.9 g/dL (ref 13.0–17.0)
MCH: 30.5 pg (ref 26.0–34.0)
MCHC: 34.3 g/dL (ref 30.0–36.0)
MCV: 88.8 fL (ref 80.0–100.0)
Platelets: 229 10*3/uL (ref 150–400)
RBC: 4.89 MIL/uL (ref 4.22–5.81)
RDW: 11.9 % (ref 11.5–15.5)
WBC: 9 10*3/uL (ref 4.0–10.5)
nRBC: 0 % (ref 0.0–0.2)

## 2021-05-13 LAB — SURGICAL PCR SCREEN
MRSA, PCR: NEGATIVE
Staphylococcus aureus: POSITIVE — AB

## 2021-05-13 LAB — BASIC METABOLIC PANEL
Anion gap: 8 (ref 5–15)
BUN: 7 mg/dL (ref 6–20)
CO2: 27 mmol/L (ref 22–32)
Calcium: 9.4 mg/dL (ref 8.9–10.3)
Chloride: 104 mmol/L (ref 98–111)
Creatinine, Ser: 0.78 mg/dL (ref 0.61–1.24)
GFR, Estimated: >60 mL/min (ref >60)
Glucose, Bld: 95 mg/dL (ref 70–99)
Potassium: 3.9 mmol/L (ref 3.5–5.1)
Sodium: 139 mmol/L (ref 135–145)

## 2021-05-13 NOTE — Progress Notes (Signed)
PCP - Dr. Loura Pardon Cardiologist - Dr. [name]  PPM/ICD - yes, Medtronic ICD Device Orders - yes, in chart. Have magnet available. Rep Notified - yes, Leanna Sato notified  Chest x-ray - 12/11/11 EKG - 02/09/21 Stress Test - pt states he had one between 2007 and 2010 ECHO - 06/06/19 Cardiac Cath - 06/01/06  Sleep Study - 2012, approximate, per pt CPAP - Pt states he usually wears one but lately, due to his back issues, he has had to sleep in a recliner and is unable to use his CPAP but he plans to continue using it when he can get back to sleeping in his bed.  DM: denies  Blood Thinner Instructions: n/a Aspirin Instructions: n/a  ERAS Protcol - yes, no drink   COVID TEST- n/a, pt tested positive for COVID on 04/01/21   Anesthesia review: yes, ICD, cardiac hx  Patient denies shortness of breath, fever, cough and chest pain at PAT appointment   All instructions explained to the patient, with a verbal understanding of the material. Patient agrees to go over the instructions while at home for a better understanding. The opportunity to ask questions was provided.

## 2021-05-13 NOTE — Progress Notes (Signed)
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 Preop clearance per telephone encounter 05/09/21, "Chart reviewed as part of pre-operative protocol coverage. Given past medical history and time since last visit, based on ACC/AHA guidelines, [name] would be at acceptable risk for the planned procedure without further cardiovascular testing. The patient was advised that if he develops new symptoms prior to surgery to contact our office to arrange for a follow-up visit, and he verbalized understanding. Given hx of VT, monitor patient on monitor during peri-operative period. "  OSA on CPAP  EKG 04/30/20: Sinus rhythm. Rate 55.  Perioperative prescription for implanted cardiac device per progress note 05/10/21: Device Information:   Clinic EP Physician:  [name], MD    Device Type:  Defibrillator Manufacturer and Phone #:  [employer]: [phone] Pacemaker Dependent?:  No. Date of Last Device Check:  05/06/21 (Remote)       Normal Device Function?:  Yes.     Electrophysiologist's Recommendations:   Have magnet available. Provide continuous ECG monitoring when magnet is used or reprogramming is to be performed.  Procedure may interfere with device function.  Magnet should be placed over device during procedure.  TTE 06/06/19:  1. Globally the RV size is mildly dilated. The RV apical segment appears  dilated and akinetic consistent with this patient's history of ARVC.   2. Akinetic right ventricular apex.   3. Left ventricular ejection fraction, by visual estimation, is 55 to  60%. The left ventricle has normal function. Normal left ventricular size.  There is no left ventricular hypertrophy.   4. Global right ventricle has low normal systolic function.The right  ventricular size is mildly enlarged. No increase in right ventricular  wall  thickness.   5. Left atrial size was normal.   6. Right atrial size was normal.   7. The mitral valve is normal in structure. No evidence of mitral valve  regurgitation.   8. The tricuspid valve is grossly normal. Tricuspid valve regurgitation  is trivial.   9. The aortic valve is tricuspid Aortic valve regurgitation was not  visualized by color flow Doppler. Structurally normal aortic valve, with  no evidence of sclerosis or stenosis.  10. The pulmonic valve was grossly normal. Pulmonic valve regurgitation is  not visualized by color flow Doppler.  11. Normal pulmonary artery systolic pressure.  12. The tricuspid regurgitant velocity is 2.16 m/s, and with an assumed  right atrial pressure of 3 mmHg, the estimated right ventricular systolic  pressure is normal at 21.7 mmHg.  13. A pacer wire is visualized in the RA and RV.  14. The inferior vena cava is normal in size with greater than 50%  respiratory variability, suggesting right atrial pressure of 3 mmHg.    Wynonia Musty [facility] Short Stay Center/Anesthesiology Phone [phone] 05/13/2021 2:57 PM

## 2021-05-13 NOTE — Progress Notes (Signed)
Remote ICD transmission.   

## 2021-05-14 ENCOUNTER — Ambulatory Visit (HOSPITAL_COMMUNITY): Payer: BC Managed Care – PPO

## 2021-05-14 ENCOUNTER — Observation Stay (HOSPITAL_COMMUNITY)
Admission: RE | Admit: 2021-05-14 | Discharge: 2021-05-15 | Disposition: A | Discharge status: Home / Self Care | Payer: BC Managed Care – PPO | Attending: Neurological Surgery | Admitting: Neurological Surgery

## 2021-05-14 ENCOUNTER — Ambulatory Visit (HOSPITAL_COMMUNITY): Payer: BC Managed Care – PPO | Admitting: Certified Registered"

## 2021-05-14 ENCOUNTER — Other Ambulatory Visit: Payer: Self-pay

## 2021-05-14 ENCOUNTER — Encounter (HOSPITAL_COMMUNITY)
Admission: RE | Disposition: A | Discharge status: Home / Self Care | Payer: Self-pay | Source: Home / Self Care | Attending: Neurological Surgery

## 2021-05-14 ENCOUNTER — Encounter (HOSPITAL_COMMUNITY): Payer: Self-pay | Admitting: Neurological Surgery

## 2021-05-14 DIAGNOSIS — M5416 Radiculopathy, lumbar region: Secondary | ICD-10-CM | POA: Diagnosis present

## 2021-05-14 DIAGNOSIS — S32050A Wedge compression fracture of fifth lumbar vertebra, initial encounter for closed fracture: Secondary | ICD-10-CM | POA: Diagnosis present

## 2021-05-14 DIAGNOSIS — M48061 Spinal stenosis, lumbar region without neurogenic claudication: Secondary | ICD-10-CM | POA: Diagnosis not present

## 2021-05-14 DIAGNOSIS — Z87891 Personal history of nicotine dependence: Secondary | ICD-10-CM | POA: Diagnosis not present

## 2021-05-14 DIAGNOSIS — Z95810 Presence of automatic (implantable) cardiac defibrillator: Secondary | ICD-10-CM | POA: Diagnosis not present

## 2021-05-14 DIAGNOSIS — X58XXXA Exposure to other specified factors, initial encounter: Secondary | ICD-10-CM | POA: Diagnosis not present

## 2021-05-14 DIAGNOSIS — M5410 Radiculopathy, site unspecified: Secondary | ICD-10-CM | POA: Diagnosis present

## 2021-05-14 DIAGNOSIS — Z419 Encounter for procedure for purposes other than remedying health state, unspecified: Secondary | ICD-10-CM

## 2021-05-14 HISTORY — PX: KYPHOPLASTY: SHX5884

## 2021-05-14 HISTORY — PX: LUMBAR LAMINECTOMY/ DECOMPRESSION WITH MET-RX: SHX5959

## 2021-05-14 SURGERY — LUMBAR LAMINECTOMY/ DECOMPRESSION WITH MET-RX
Anesthesia: General | Site: Spine Lumbar

## 2021-05-14 MED ORDER — PHENOL 1.4 % MT LIQD: 1.0000 | OROMUCOSAL | Status: DC | PRN

## 2021-05-14 MED ORDER — ACETAMINOPHEN 500 MG PO TABS
1000.0000 mg | ORAL_TABLET | Freq: Once | ORAL | Status: AC
  Administered 2021-05-14: 1000 mg via ORAL
  Filled 2021-05-14: qty 2

## 2021-05-14 MED ORDER — CYCLOBENZAPRINE HCL 10 MG PO TABS
10.0000 mg | ORAL_TABLET | Freq: Three times a day (TID) | ORAL | Status: DC | PRN
  Filled 2021-05-14: qty 1

## 2021-05-14 MED ORDER — PHENYLEPHRINE HCL-NACL 20-0.9 MG/250ML-% IV SOLN
INTRAVENOUS | Status: AC
  Filled 2021-05-14: qty 250

## 2021-05-14 MED ORDER — PROMETHAZINE HCL 25 MG/ML IJ SOLN: 6.2500 mg | INTRAMUSCULAR | Status: DC | PRN

## 2021-05-14 MED ORDER — DEXAMETHASONE SODIUM PHOSPHATE 10 MG/ML IJ SOLN
INTRAMUSCULAR | Status: AC
  Filled 2021-05-14: qty 1

## 2021-05-14 MED ORDER — CEFAZOLIN SODIUM-DEXTROSE 2-4 GM/100ML-% IV SOLN
2.0000 g | INTRAVENOUS | Status: AC
  Administered 2021-05-14: 2 g via INTRAVENOUS
  Filled 2021-05-14: qty 100

## 2021-05-14 MED ORDER — MIDAZOLAM HCL 2 MG/2ML IJ SOLN
INTRAMUSCULAR | Status: AC
  Filled 2021-05-14: qty 2

## 2021-05-14 MED ORDER — PROPOFOL 10 MG/ML IV BOLUS
INTRAVENOUS | Status: AC
  Filled 2021-05-14: qty 20

## 2021-05-14 MED ORDER — MIDAZOLAM HCL 5 MG/5ML IJ SOLN
INTRAMUSCULAR | Status: DC | PRN
  Administered 2021-05-14: 2 mg via INTRAVENOUS

## 2021-05-14 MED ORDER — HYDROMORPHONE HCL 1 MG/ML IJ SOLN
1.0000 mg | INTRAMUSCULAR | Status: DC | PRN
Start: 2021-05-14 — End: 2021-05-15

## 2021-05-14 MED ORDER — LACTATED RINGERS IV SOLN: INTRAVENOUS | Status: DC

## 2021-05-14 MED ORDER — HYDROMORPHONE HCL 1 MG/ML IJ SOLN
0.2500 mg | INTRAMUSCULAR | Status: DC | PRN
  Administered 2021-05-14 (×2): 0.25 mg via INTRAVENOUS

## 2021-05-14 MED ORDER — ORAL CARE MOUTH RINSE: 15.0000 mL | Freq: Once | OROMUCOSAL | Status: AC

## 2021-05-14 MED ORDER — FENTANYL CITRATE (PF) 250 MCG/5ML IJ SOLN
INTRAMUSCULAR | Status: AC
  Filled 2021-05-14: qty 5

## 2021-05-14 MED ORDER — SODIUM CHLORIDE 0.9% FLUSH: 3.0000 mL | INTRAVENOUS | Status: DC | PRN

## 2021-05-14 MED ORDER — MEPERIDINE HCL 25 MG/ML IJ SOLN: 6.2500 mg | INTRAMUSCULAR | Status: DC | PRN

## 2021-05-14 MED ORDER — KETAMINE HCL 50 MG/5ML IJ SOSY
PREFILLED_SYRINGE | INTRAMUSCULAR | Status: AC
  Filled 2021-05-14: qty 5

## 2021-05-14 MED ORDER — EPHEDRINE 5 MG/ML INJ
INTRAVENOUS | Status: AC
  Filled 2021-05-14: qty 10

## 2021-05-14 MED ORDER — POLYETHYLENE GLYCOL 3350 17 G PO PACK: 17.0000 g | PACK | Freq: Every day | ORAL | Status: DC | PRN

## 2021-05-14 MED ORDER — CHLORHEXIDINE GLUCONATE 0.12 % MT SOLN
15.0000 mL | Freq: Once | OROMUCOSAL | Status: AC
  Administered 2021-05-14: 15 mL via OROMUCOSAL
  Filled 2021-05-14: qty 15

## 2021-05-14 MED ORDER — LIDOCAINE-EPINEPHRINE 1 %-1:100000 IJ SOLN
INTRAMUSCULAR | Status: AC
  Filled 2021-05-14: qty 1

## 2021-05-14 MED ORDER — 0.9 % SODIUM CHLORIDE (POUR BTL) OPTIME
TOPICAL | Status: DC | PRN
  Administered 2021-05-14: 1000 mL

## 2021-05-14 MED ORDER — SODIUM CHLORIDE 0.9 % IV SOLN: 250.0000 mL | INTRAVENOUS | Status: DC

## 2021-05-14 MED ORDER — MUPIROCIN 2 % EX OINT
1.0000 "application " | TOPICAL_OINTMENT | Freq: Two times a day (BID) | CUTANEOUS | Status: DC
  Administered 2021-05-14: 1 via NASAL
  Filled 2021-05-14 (×2): qty 22

## 2021-05-14 MED ORDER — SODIUM CHLORIDE 0.9% FLUSH: 3.0000 mL | Freq: Two times a day (BID) | INTRAVENOUS | Status: DC

## 2021-05-14 MED ORDER — ADULT MULTIVITAMIN W/MINERALS CH: 1.0000 | ORAL_TABLET | Freq: Every day | ORAL | Status: DC

## 2021-05-14 MED ORDER — EPHEDRINE SULFATE-NACL 50-0.9 MG/10ML-% IV SOSY
PREFILLED_SYRINGE | INTRAVENOUS | Status: DC | PRN
  Administered 2021-05-14 (×2): 5 mg via INTRAVENOUS

## 2021-05-14 MED ORDER — ACETAMINOPHEN 325 MG PO TABS
650.0000 mg | ORAL_TABLET | ORAL | Status: DC | PRN
  Administered 2021-05-15: 650 mg via ORAL
  Filled 2021-05-14 (×2): qty 2

## 2021-05-14 MED ORDER — LORATADINE 10 MG PO TABS: 10.0000 mg | ORAL_TABLET | Freq: Every day | ORAL | Status: DC

## 2021-05-14 MED ORDER — ONDANSETRON HCL 4 MG/2ML IJ SOLN
INTRAMUSCULAR | Status: AC
  Filled 2021-05-14: qty 2

## 2021-05-14 MED ORDER — OXYCODONE HCL 5 MG PO TABS
5.0000 mg | ORAL_TABLET | ORAL | Status: DC | PRN
  Administered 2021-05-15: 5 mg via ORAL

## 2021-05-14 MED ORDER — SUGAMMADEX SODIUM 200 MG/2ML IV SOLN
INTRAVENOUS | Status: DC | PRN
  Administered 2021-05-14: 200 mg via INTRAVENOUS

## 2021-05-14 MED ORDER — DOCUSATE SODIUM 100 MG PO CAPS
100.0000 mg | ORAL_CAPSULE | Freq: Two times a day (BID) | ORAL | Status: DC
  Administered 2021-05-14: 100 mg via ORAL
  Filled 2021-05-14: qty 1

## 2021-05-14 MED ORDER — POTASSIUM CHLORIDE CRYS ER 20 MEQ PO TBCR
40.0000 meq | EXTENDED_RELEASE_TABLET | Freq: Two times a day (BID) | ORAL | Status: DC
  Administered 2021-05-14: 40 meq via ORAL
  Filled 2021-05-14: qty 2

## 2021-05-14 MED ORDER — ONDANSETRON HCL 4 MG/2ML IJ SOLN: 4.0000 mg | Freq: Four times a day (QID) | INTRAMUSCULAR | Status: DC | PRN

## 2021-05-14 MED ORDER — CHLORHEXIDINE GLUCONATE CLOTH 2 % EX PADS: 6.0000 | MEDICATED_PAD | Freq: Every day | CUTANEOUS | Status: DC

## 2021-05-14 MED ORDER — KETAMINE HCL 10 MG/ML IJ SOLN
INTRAMUSCULAR | Status: DC | PRN
  Administered 2021-05-14: 40 mg via INTRAVENOUS

## 2021-05-14 MED ORDER — ROCURONIUM BROMIDE 10 MG/ML (PF) SYRINGE
PREFILLED_SYRINGE | INTRAVENOUS | Status: DC | PRN
  Administered 2021-05-14: 50 mg via INTRAVENOUS
  Administered 2021-05-14: 20 mg via INTRAVENOUS

## 2021-05-14 MED ORDER — OXYCODONE HCL 5 MG PO TABS
10.0000 mg | ORAL_TABLET | ORAL | Status: DC | PRN
  Administered 2021-05-14 – 2021-05-15 (×2): 10 mg via ORAL
  Filled 2021-05-14 (×3): qty 2

## 2021-05-14 MED ORDER — PHENYLEPHRINE 40 MCG/ML (10ML) SYRINGE FOR IV PUSH (FOR BLOOD PRESSURE SUPPORT)
PREFILLED_SYRINGE | INTRAVENOUS | Status: DC | PRN
  Administered 2021-05-14: 120 ug via INTRAVENOUS

## 2021-05-14 MED ORDER — OXYCODONE-ACETAMINOPHEN 5-325 MG PO TABS: 1.0000 | ORAL_TABLET | ORAL | 0 refills | Status: DC | PRN

## 2021-05-14 MED ORDER — FENTANYL CITRATE (PF) 250 MCG/5ML IJ SOLN
INTRAMUSCULAR | Status: DC | PRN
  Administered 2021-05-14: 100 ug via INTRAVENOUS
  Administered 2021-05-14: 150 ug via INTRAVENOUS

## 2021-05-14 MED ORDER — GABAPENTIN 100 MG PO CAPS
100.0000 mg | ORAL_CAPSULE | Freq: Two times a day (BID) | ORAL | Status: DC
  Administered 2021-05-14: 100 mg via ORAL
  Filled 2021-05-14: qty 1

## 2021-05-14 MED ORDER — PROPOFOL 10 MG/ML IV BOLUS
INTRAVENOUS | Status: DC | PRN
  Administered 2021-05-14: 200 mg via INTRAVENOUS

## 2021-05-14 MED ORDER — SUCCINYLCHOLINE CHLORIDE 200 MG/10ML IV SOSY
PREFILLED_SYRINGE | INTRAVENOUS | Status: AC
  Filled 2021-05-14: qty 10

## 2021-05-14 MED ORDER — MENTHOL 3 MG MT LOZG: 1.0000 | LOZENGE | OROMUCOSAL | Status: DC | PRN

## 2021-05-14 MED ORDER — CHLORHEXIDINE GLUCONATE CLOTH 2 % EX PADS: 6.0000 | MEDICATED_PAD | Freq: Once | CUTANEOUS | Status: DC

## 2021-05-14 MED ORDER — LACTATED RINGERS IV SOLN: INTRAVENOUS | Status: DC | PRN

## 2021-05-14 MED ORDER — ROCURONIUM BROMIDE 10 MG/ML (PF) SYRINGE
PREFILLED_SYRINGE | INTRAVENOUS | Status: AC
  Filled 2021-05-14: qty 10

## 2021-05-14 MED ORDER — ONDANSETRON HCL 4 MG PO TABS
4.0000 mg | ORAL_TABLET | Freq: Four times a day (QID) | ORAL | Status: DC | PRN
Start: 2021-05-14 — End: 2021-05-15

## 2021-05-14 MED ORDER — THROMBIN 5000 UNITS EX SOLR: OROMUCOSAL | Status: DC | PRN

## 2021-05-14 MED ORDER — THROMBIN 5000 UNITS EX SOLR
CUTANEOUS | Status: AC
  Filled 2021-05-14: qty 5000

## 2021-05-14 MED ORDER — MAGNESIUM OXIDE -MG SUPPLEMENT 400 (240 MG) MG PO TABS
400.0000 mg | ORAL_TABLET | Freq: Two times a day (BID) | ORAL | Status: DC
  Administered 2021-05-14: 400 mg via ORAL
  Filled 2021-05-14 (×3): qty 1

## 2021-05-14 MED ORDER — PHENYLEPHRINE 40 MCG/ML (10ML) SYRINGE FOR IV PUSH (FOR BLOOD PRESSURE SUPPORT)
PREFILLED_SYRINGE | INTRAVENOUS | Status: AC
  Filled 2021-05-14: qty 10

## 2021-05-14 MED ORDER — PHENYLEPHRINE HCL-NACL 20-0.9 MG/250ML-% IV SOLN
INTRAVENOUS | Status: DC | PRN
  Administered 2021-05-14: 25 ug/min via INTRAVENOUS

## 2021-05-14 MED ORDER — HYDROMORPHONE HCL 1 MG/ML IJ SOLN
INTRAMUSCULAR | Status: AC
  Filled 2021-05-14: qty 1

## 2021-05-14 MED ORDER — DEXAMETHASONE SODIUM PHOSPHATE 10 MG/ML IJ SOLN
INTRAMUSCULAR | Status: DC | PRN
  Administered 2021-05-14: 5 mg via INTRAVENOUS

## 2021-05-14 MED ORDER — MONTELUKAST SODIUM 10 MG PO TABS
10.0000 mg | ORAL_TABLET | Freq: Every day | ORAL | Status: DC
  Administered 2021-05-14: 10 mg via ORAL
  Filled 2021-05-14: qty 1

## 2021-05-14 MED ORDER — CEFAZOLIN SODIUM-DEXTROSE 2-4 GM/100ML-% IV SOLN
2.0000 g | Freq: Three times a day (TID) | INTRAVENOUS | Status: AC
Start: 2021-05-14 — End: 2021-05-15
  Administered 2021-05-14 – 2021-05-15 (×2): 2 g via INTRAVENOUS
  Filled 2021-05-14 (×2): qty 100

## 2021-05-14 MED ORDER — SOTALOL HCL 120 MG PO TABS
180.0000 mg | ORAL_TABLET | Freq: Two times a day (BID) | ORAL | Status: DC
  Administered 2021-05-14: 180 mg via ORAL
  Filled 2021-05-14 (×3): qty 1.5

## 2021-05-14 MED ORDER — ACETAMINOPHEN 650 MG RE SUPP: 650.0000 mg | RECTAL | Status: DC | PRN

## 2021-05-14 MED ORDER — ONDANSETRON HCL 4 MG/2ML IJ SOLN
INTRAMUSCULAR | Status: DC | PRN
  Administered 2021-05-14: 4 mg via INTRAVENOUS

## 2021-05-14 MED ORDER — CHLORHEXIDINE GLUCONATE 0.12 % MT SOLN
OROMUCOSAL | Status: AC
  Filled 2021-05-14: qty 15

## 2021-05-14 SURGICAL SUPPLY — 46 items
ADH SKN CLS APL DERMABOND .7 (GAUZE/BANDAGES/DRESSINGS) ×1
BAG COUNTER SPONGE SURGICOUNT (BAG) ×2 IMPLANT
BAG SPNG CNTER NS LX DISP (BAG) ×1
BAND INSRT 18 STRL LF DISP RB (MISCELLANEOUS) ×2
BAND RUBBER #18 3X1/16 STRL (MISCELLANEOUS) ×4 IMPLANT
BLADE SURG 11 STRL SS (BLADE) ×2 IMPLANT
BUR MATCHSTICK NEURO 3.0 LAGG (BURR) ×1 IMPLANT
CANISTER SUCT 3000ML PPV (MISCELLANEOUS) ×2 IMPLANT
CEMENT KYPHON CX01A KIT/MIXER (Cement) ×1 IMPLANT
DECANTER SPIKE VIAL GLASS SM (MISCELLANEOUS) ×2 IMPLANT
DERMABOND ADVANCED (GAUZE/BANDAGES/DRESSINGS) ×1
DERMABOND ADVANCED .7 DNX12 (GAUZE/BANDAGES/DRESSINGS) ×1 IMPLANT
DEVICE BIOPSY BONE KYPHX (INSTRUMENTS) ×1 IMPLANT
DRAPE C-ARM 42X72 X-RAY (DRAPES) ×5 IMPLANT
DRAPE LAPAROTOMY 100X72X124 (DRAPES) ×2 IMPLANT
DRAPE MICROSCOPE LEICA (MISCELLANEOUS) ×2 IMPLANT
DURAPREP 26ML APPLICATOR (WOUND CARE) ×2 IMPLANT
ELECT BLADE 6.5 EXT (BLADE) ×2 IMPLANT
ELECT REM PT RETURN 9FT ADLT (ELECTROSURGICAL) ×2
ELECTRODE REM PT RTRN 9FT ADLT (ELECTROSURGICAL) ×1 IMPLANT
GAUZE 4X4 16PLY ~~LOC~~+RFID DBL (SPONGE) ×1 IMPLANT
GLOVE SURG LTX SZ7.5 (GLOVE) ×2 IMPLANT
GLOVE SURG UNDER POLY LF SZ7.5 (GLOVE) ×2 IMPLANT
GOWN STRL REUS W/ TWL LRG LVL3 (GOWN DISPOSABLE) ×2 IMPLANT
GOWN STRL REUS W/ TWL XL LVL3 (GOWN DISPOSABLE) IMPLANT
GOWN STRL REUS W/TWL 2XL LVL3 (GOWN DISPOSABLE) IMPLANT
GOWN STRL REUS W/TWL LRG LVL3 (GOWN DISPOSABLE) ×4
GOWN STRL REUS W/TWL XL LVL3 (GOWN DISPOSABLE)
HEMOSTAT POWDER KIT SURGIFOAM (HEMOSTASIS) ×2 IMPLANT
KIT BASIN OR (CUSTOM PROCEDURE TRAY) ×2 IMPLANT
KIT TURNOVER KIT B (KITS) ×2 IMPLANT
NDL SPNL 18GX3.5 QUINCKE PK (NEEDLE) ×1 IMPLANT
NEEDLE HYPO 22GX1.5 SAFETY (NEEDLE) ×2 IMPLANT
NEEDLE SPNL 18GX3.5 QUINCKE PK (NEEDLE) ×2 IMPLANT
NS IRRIG 1000ML POUR BTL (IV SOLUTION) ×2 IMPLANT
PACK LAMINECTOMY NEURO (CUSTOM PROCEDURE TRAY) ×2 IMPLANT
PAD ARMBOARD 7.5X6 YLW CONV (MISCELLANEOUS) ×6 IMPLANT
SPONGE T-LAP 4X18 ~~LOC~~+RFID (SPONGE) IMPLANT
SUT MNCRL AB 3-0 PS2 18 (SUTURE) IMPLANT
SUT VIC AB 0 CT1 18XCR BRD8 (SUTURE) IMPLANT
SUT VIC AB 0 CT1 8-18 (SUTURE)
SUT VIC AB 2-0 CP2 18 (SUTURE) ×2 IMPLANT
TOWEL GREEN STERILE (TOWEL DISPOSABLE) ×2 IMPLANT
TOWEL GREEN STERILE FF (TOWEL DISPOSABLE) ×2 IMPLANT
TRAY KYPHOPAK 15/3 ONESTEP 1ST (MISCELLANEOUS) ×1 IMPLANT
WATER STERILE IRR 1000ML POUR (IV SOLUTION) ×2 IMPLANT

## 2021-05-14 NOTE — Progress Notes (Signed)
Failed IV attempts by three nurses. Charge CRNA notified, and will attempt PIV.

## 2021-05-14 NOTE — Anesthesia Procedure Notes (Signed)
Procedure Name: Intubation Date/Time: 05/14/2021 2:20 PM Performed by: Gaylene Brooks, CRNA Pre-anesthesia Checklist: Patient identified, Emergency Drugs available, Suction available, Patient being monitored and Timeout performed Patient Re-evaluated:Patient Re-evaluated prior to induction Oxygen Delivery Method: Circle system utilized Preoxygenation: Pre-oxygenation with 100% oxygen Induction Type: IV induction Ventilation: Mask ventilation without difficulty Laryngoscope Size: Miller and 3 Grade View: Grade II Tube size: 7.5 mm Number of attempts: 1 Airway Equipment and Method: Stylet Placement Confirmation: ETT inserted through vocal cords under direct vision, positive ETCO2, CO2 detector and breath sounds checked- equal and bilateral Secured at: 22 cm Tube secured with: Tape Dental Injury: Teeth and Oropharynx as per pre-operative assessment

## 2021-05-14 NOTE — Progress Notes (Signed)
Wife made aware of patient sending night

## 2021-05-14 NOTE — Op Note (Signed)
PATIENT: [name]  DAY OF SURGERY: 05/14/21   PRE-OPERATIVE DIAGNOSIS:  Lumbar radiculopathy, lumbar compression fracture   POST-OPERATIVE DIAGNOSIS:  Same   PROCEDURE:  Minimally invasive L4-5 laminectomy, right L5 vertebral body biopsy and vertebroplasty   SURGEON:  Surgeon(s) and Role:    Judith Part, MD - Primary   ANESTHESIA: ETGA   BRIEF HISTORY: This is a 45 year old man who presented with severe back and leg pain after coughing, was found to have a fracture of L5 with symptomatic lateral recess stenosis. I therefore recommended decompresison of the lateral recess disease. Given the very atypical cause of his fracture, I also recommended a biopsy of the bone to evaluate for underlying pathology as well as vertebroplasty to help with his back pain. This was discussed with the patient as well as risks, benefits, and alternatives and the patient wished to proceed with surgical treatment.   OPERATIVE DETAIL: The patient was taken to the operating room and placed on the OR table in the prone position. A formal time out was performed with two patient identifiers and confirmed the operative site. Anesthesia was induced by the anesthesia team. The operative site was marked, hair was clipped with surgical clippers, the area was then prepped and draped in a sterile fashion. Fluoroscopy was used to identify the surgical level with a spinal needle. A 2cm incision was then marked 1cm off to the right of midline. A right side approach was used to maximize left lateral decompression, since it was more symptomatic. The fascia was incised sharply and serial dilators were docked to the spinolaminar interface on L4. A final tubular distractor was placed and secured to the table and fluoro again confirmed the correct surgical level. The spinous process, lamina, and facet locations were confirmed for orientation. A high speed drill was then used to perform a laminotomy until the insertion of the  ligamentum flavum was identified superiorly. This was then extended to complete the hemilaminotomy by extending medially to the midline and laterally to the lateral insertion of the ligamentum flavum. The table was tilted and tubular dilator was adjusted to look across midline. An 'over the top' decompression was then performed by dissecting the ligamentum flavum off of the spinous process and lamina and identifying its insertion superiorly on the lamina and laterally at the facet. A #9 suction was then used to protect the thecal sac while the contralateral lamina was removed. With drilling complete, the ligamentum flavum was then completely resected from its insertion and dura was palpated in all directions to confirm good decompression. Hemostasis was obtained, the wound was copiously irrigated, and the tube was removed while using the microscope to confirm hemostasis of the muscle edges.   The same fascial incision was then used to guide a Jamshidi needle into the right L5 pedicle and into the fracture. A biopsy was taken and sent to pathology and the biopsy needle was exchanged for a cement inserter. Cement was placed into the fracture using fluoroscopic guidance. The stylet was reintroduced, the needle was removed, and hemostasis was again checked.  All instrument and sponge counts were correct and the incision was then closed in layers. The patient was then returned to anesthesia for emergence. No apparent complications at the completion of the procedure.   EBL:  84mL   DRAINS: none   SPECIMENS: L5 vertebral body bone biopsy   Judith Part, MD 05/14/21 5:02 PM

## 2021-05-14 NOTE — H&P (Signed)
Surgical H&P Update  HPI: 45 y.o. man with LLE > RLE radicular and low back pain.  No changes in health since he was last seen. 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 He's here today for treatment of those. Still having symptoms and wishes to proceed with surgery.  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   Asthma when younger   History of chicken pox    ICD (implantable cardiac defibrillator) in place    Sleep apnea    uses cpap   Ventricular tachycardia (HCC)    sotalol therapy;  catheter ablation at [facility] 10/10 and [facility] 2013   FamHx:  Family History  Problem Relation Age of Onset   Sudden death Father    Asthma Mother    Diabetes Other    Stroke Other    Heart attack Other    Prostate cancer Other    Breast cancer Other    Ovarian cancer Other    Uterine cancer Other    Colon cancer Other    Drug abuse Other    Depression Other    Lung cancer Maternal Grandfather    Heart disease Maternal Grandfather    Heart disease Paternal Grandfather    Hypertension Paternal Grandmother    SocHx:  reports that he quit smoking about 13 years ago. His smoking use included cigarettes. He started smoking about 31 years ago. He has a 18.00 pack-year smoking history. He has never used smokeless tobacco. He reports that he does not drink alcohol and does not use drugs.  Physical Exam: AOx3, PERRL, FS, TM  Strength 5/5 x4, SILTx4 except L L5 numbness  Assesment/Plan: 45 y.o. 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 Risks, benefits, and alternatives discussed and the patient would like to continue with surgery.  -OR today -discharge home from PACU post-op  Judith Part,  MD 05/14/21 1:59 PM

## 2021-05-14 NOTE — Transfer of Care (Signed)
Immediate Anesthesia Transfer of Care Note  Patient: [name]  Procedure(s) Performed: Lumbar Four-Five Minimally Invasive Laminectomy (Spine Lumbar) Lumbar Five Vertebroplasty (Spine Lumbar)  Patient Location: PACU  Anesthesia Type:General  Level of Consciousness: drowsy, patient cooperative and responds to stimulation  Airway & Oxygen Therapy: Patient Spontanous Breathing and Patient connected to face mask oxygen  Post-op Assessment: Report given to RN and Post -op Vital signs reviewed and stable  Post vital signs: Reviewed and stable  Last Vitals:  Vitals Value Taken Time  BP 118/65 05/14/21 1655  Temp    Pulse 57 05/14/21 1656  Resp 9 05/14/21 1657  SpO2 100 % 05/14/21 1656  Vitals shown include unvalidated device data.  Last Pain:  Vitals:   05/14/21 1024  TempSrc:   PainSc: 3       Patients Stated Pain Goal: 3 (30/85/69 4370)  Complications: No notable events documented.

## 2021-05-14 NOTE — Anesthesia Preprocedure Evaluation (Addendum)
Anesthesia Evaluation  Patient identified by MRN, date of birth, ID band Patient awake    Reviewed: Allergy & Precautions, NPO status , Patient's Chart, lab work & pertinent test results  Airway Mallampati: II  TM Distance: >3 FB Neck ROM: Full    Dental  (+) Dental Advisory Given, Teeth Intact   Pulmonary asthma , sleep apnea , former smoker,    Pulmonary exam normal breath sounds clear to auscultation       Cardiovascular Normal cardiovascular exam+ Cardiac Defibrillator  Rhythm:Regular Rate:Normal  Echo 05/2019 1. Globally the RV size is mildly dilated. The RV apical segment appears dilated and akinetic consistent with this patient's history of ARVC.  2. Akinetic right ventricular apex.  3. Left ventricular ejection fraction, by visual estimation, is 55 to 60%. The left ventricle has normal function. Normal left ventricular size. There is no left ventricular hypertrophy.  4. Global right ventricle has low normal systolic function.The right ventricular size is mildly enlarged. No increase in right ventricular wall thickness.  5. Left atrial size was normal.  6. Right atrial size was normal.  7. The mitral valve is normal in structure. No evidence of mitral valve regurgitation.  8. The tricuspid valve is grossly normal. Tricuspid valve regurgitation is trivial.  9. The aortic valve is tricuspid Aortic valve regurgitation was not visualized by color flow Doppler. Structurally normal aortic valve, with no evidence of sclerosis or stenosis.  10. The pulmonic valve was grossly normal. Pulmonic valve regurgitation is not visualized by color flow Doppler.  11. Normal pulmonary artery systolic pressure.  12. The tricuspid regurgitant velocity is 2.16 m/s, and with an assumed right atrial pressure of 3 mmHg, the estimated right ventricular systolic pressure is normal at 21.7 mmHg.  13. A pacer wire is visualized in the RA and RV.   14. The inferior vena cava is normal in size with greater than 50% respiratory variability, suggesting right atrial pressure of 3 mmHg.    AICD check 04/2021 99.7% VS, 0.3% VP. Pt denies defib since second ablation in 05/2012   Neuro/Psych PSYCHIATRIC DISORDERS Anxiety negative neurological ROS     GI/Hepatic Neg liver ROS, GERD  ,  Endo/Other  negative endocrine ROS  Renal/GU Renal disease     Musculoskeletal negative musculoskeletal ROS (+)   Abdominal   Peds  Hematology negative hematology ROS (+)   Anesthesia Other Findings   Reproductive/Obstetrics                           Anesthesia Physical Anesthesia Plan  ASA: 3  Anesthesia Plan: General   Post-op Pain Management:    Induction: Intravenous  PONV Risk Score and Plan: 3 and Ondansetron, Dexamethasone, Treatment may vary due to age or medical condition and Midazolam  Airway Management Planned: Oral ETT  Additional Equipment:   Intra-op Plan:   Post-operative Plan: Extubation in OR  Informed Consent: I have reviewed the patients History and Physical, chart, labs and discussed the procedure including the risks, benefits and alternatives for the proposed anesthesia with the patient or authorized representative who has indicated his/her understanding and acceptance.     Dental advisory given  Plan Discussed with: CRNA  Anesthesia Plan Comments:        Anesthesia Quick Evaluation

## 2021-05-14 NOTE — Progress Notes (Signed)
Attempted to dress patient to prepare for discharge patient reports he does not feel well and does not have any strength. Did sit upon side of bed report not feeling well and become pale again reports being weak Dr. Zada Finders made aware

## 2021-05-15 ENCOUNTER — Encounter (HOSPITAL_COMMUNITY): Payer: Self-pay | Admitting: Neurological Surgery

## 2021-05-15 DIAGNOSIS — S32050A Wedge compression fracture of fifth lumbar vertebra, initial encounter for closed fracture: Secondary | ICD-10-CM | POA: Diagnosis not present

## 2021-05-15 NOTE — Plan of Care (Signed)
Pt doing well. Pt given D/C instructions with verbal understanding. Rx's were sent to the pharmacy by MD. Pt's incision is clean and dry with no sign of infection. Pt's IV was removed prior to D/C. Pt D/C'd home via wheelchair per MD order. Pt is stable @ D/C and has no other needs at this time. Velicia Dejager, RN  

## 2021-05-15 NOTE — Evaluation (Signed)
Occupational Therapy Evaluation Patient Details Name: [name] MRN: [id number] DOB: 02-17-1976 Today's Date: 05/15/2021   History of Present Illness 45 y.o. man who is s/p L4-5 MIS decompression, L5 vertebral body biopsy and vertebroplasty 9/27. PMHx: AICD, anxiety, asthma, ICD   Clinical Impression   [name] was indep with all ADL/IADLs and mobility prior to the above back sx. He lives in a 1 level home, 0 STE with his wife 2yo daughter, and his mom is near by to assist as needed. Pt demonstrated great ability to completed ADLs with compensatory techniques to maintain back precautions. He was also supervision for functional mobility without AD. Pt does not need acute OT. Recommend d/c home with supervision intermittent for ADLs and mobility.      Recommendations for follow up therapy are one component of a multi-disciplinary discharge planning process, led by the attending physician.  Recommendations may be updated based on patient status, additional functional criteria and insurance authorization.   Follow Up Recommendations  No OT follow up;Supervision - Intermittent    Equipment Recommendations  None recommended by OT       Precautions / Restrictions Precautions Precautions: Back;Fall Precaution Booklet Issued: Yes (comment) Restrictions Weight Bearing Restrictions: No      Mobility Bed Mobility                    Transfers Overall transfer level: Needs assistance Equipment used: None Transfers: Sit to/from Stand Sit to Stand: Supervision         General transfer comment: for safety only    Balance Overall balance assessment: Needs assistance Sitting-balance support: Feet supported Sitting balance-[name] Scale: Good     Standing balance support: No upper extremity supported Standing balance-[name] Scale: Fair           ADL either performed or assessed with clinical judgement   ADL Overall ADL's : Needs assistance/impaired Eating/Feeding:  Independent;Sitting   Grooming: Supervision/safety;Cueing for compensatory techniques;Standing   Upper Body Bathing: Set up;Sitting   Lower Body Bathing: Supervison/ safety;Cueing for compensatory techniques;Sit to/from stand   Upper Body Dressing : Set up;Sitting   Lower Body Dressing: Supervision/safety;Cueing for compensatory techniques;Cueing for back precautions;Sit to/from stand   Toilet Transfer: Supervision/safety;Ambulation;Regular Toilet   Toileting- Clothing Manipulation and Hygiene: Supervision/safety;Sitting/lateral lean;Cueing for compensatory techniques;Cueing for back precautions       Functional mobility during ADLs: Supervision/safety;Rolling walker General ADL Comments: pt demosntrated great ability to complete ADLs while maintaining back precautions     Vision Patient Visual Report: No change from baseline Vision Assessment?: No apparent visual deficits            Pertinent Vitals/Pain Pain Assessment: Faces Faces Pain Scale: Hurts a little bit Pain Location: back Pain Descriptors / Indicators: Discomfort Pain Intervention(s): Limited activity within patient's tolerance;Monitored during session        Extremity/Trunk Assessment Upper Extremity Assessment Upper Extremity Assessment: Overall WFL for tasks assessed   Lower Extremity Assessment Lower Extremity Assessment: Defer to PT evaluation   Cervical / Trunk Assessment Cervical / Trunk Assessment: Other exceptions Cervical / Trunk Exceptions: s/p back sx   Communication Communication Communication: No difficulties   Cognition Arousal/Alertness: Awake/alert Behavior During Therapy: WFL for tasks assessed/performed;Flat affect Overall Cognitive Status: Within Functional Limits for tasks assessed                 General Comments  VSS on RA            Home Living Family/patient expects to be  discharged to:: Private residence Living Arrangements: Spouse/significant  other;Children Available Help at Discharge: Family;Friend(s);Home health;Available 24 hours/day Type of Home: House Home Access: Level entry     Home Layout: One level     Bathroom Shower/Tub: Tub/shower unit;Walk-in shower   Bathroom Toilet: Standard Bathroom Accessibility: Yes How Accessible: Accessible via walker Home Equipment: None   Additional Comments: pt lives pt lives with wife who works during the day, 12 year old child, and mother lives near by who can assist as needed.      Prior Functioning/Environment Level of Independence: Independent        Comments: works and drives        OT Problem List: Decreased activity tolerance;Impaired balance (sitting and/or standing);Pain;Decreased knowledge of precautions      OT Treatment/Interventions:      OT Goals(Current goals can be found in the care plan section) Acute Rehab OT Goals Patient Stated Goal: home asap OT Goal Formulation: With patient   AM-PAC OT "6 Clicks" Daily Activity     Outcome Measure Help from another person eating meals?: None Help from another person taking care of personal grooming?: A Little Help from another person toileting, which includes using toliet, bedpan, or urinal?: A Little Help from another person bathing (including washing, rinsing, drying)?: A Little Help from another person to put on and taking off regular upper body clothing?: None Help from another person to put on and taking off regular lower body clothing?: A Little 6 Click Score: 20   End of Session Nurse Communication: Mobility status  Activity Tolerance: Patient tolerated treatment well Patient left: in bed;with call bell/phone within reach  OT Visit Diagnosis: Other abnormalities of gait and mobility (R26.89);Pain                Time: 0803-0820 OT Time Calculation (min): 17 min Charges:  OT General Charges $OT Visit: 1 Visit OT Evaluation $OT Eval Low Complexity: 1 Low   Naudia Crosley A Grover Robinson 05/15/2021, 9:27  AM

## 2021-05-15 NOTE — Progress Notes (Signed)
Neurosurgery Service Progress Note  Subjective: No acute events overnight, leg pain gone post-op   Objective: Vitals:   05/14/21 2009 05/14/21 2331 05/15/21 0445 05/15/21 0742  BP: 113/62 114/66 114/60 115/72  Pulse: 65 76 63 65  Resp: 18 18 20 17   Temp: 98.1 F (36.7 C) 98.5 F (36.9 C) 97.8 F (36.6 C) 98 F (36.7 C)  TempSrc: Oral Oral Oral Oral  SpO2: 94% 94% 95% 97%  Weight:      Height:        Physical Exam: Up and walking w/ PT with normal gait  Assessment & Plan: 45 y.o. man s/p MIS decompression / L5 biopsy and vertebroplasty, recovering well.  -discharge home today  ERIQ HUFFORD  05/15/21 9:05 AM

## 2021-05-15 NOTE — Evaluation (Signed)
Physical Therapy Evaluation and Discharge Patient Details Name: [name] MRN: [id number] DOB: [birth year] Today's Date: 05/15/2021  History of Present Illness  Pt is a 45 y.o. man who is s/p L4-5 MIS decompression, L5 vertebral body biopsy and vertebroplasty 05/14/21. PMHx: AICD, anxiety, asthma, ICD   Clinical Impression  Patient evaluated by Physical Therapy with no further acute PT needs identified. All education has been completed and the patient has no further questions. Pt was able to demonstrate transfers and ambulation with gross modified independence and no AD. Pt was educated on precautions, brace application/wearing schedule, appropriate activity progression, and car transfer. See below for any follow-up Physical Therapy or equipment needs. PT is signing off. Thank you for this referral.        Recommendations for follow up therapy are one component of a multi-disciplinary discharge planning process, led by the attending physician.  Recommendations may be updated based on patient status, additional functional criteria and insurance authorization.  Follow Up Recommendations No PT follow up;Supervision - Intermittent    Equipment Recommendations  None recommended by PT    Recommendations for Other Services       Precautions / Restrictions Precautions Precautions: Back;Fall Precaution Booklet Issued: Yes (comment) Precaution Comments: Reviewed handout and pt was cued for precautions for functional mobility. Required Braces or Orthoses:  (No brace needed) Restrictions Weight Bearing Restrictions: No      Mobility  Bed Mobility Overal bed mobility: Modified Independent             General bed mobility comments: HOB flat and no use of rails. No assist required.    Transfers Overall transfer level: Modified independent Equipment used: None Transfers: Sit to/from Stand Sit to Stand: Supervision         General transfer comment: No assist. Pt with good posture  during sit<>stand.  Ambulation/Gait Ambulation/Gait assistance: Modified independent (Device/Increase time) Gait Distance (Feet): 560 Feet Assistive device: None Gait Pattern/deviations: Step-through pattern;Decreased stride length;Trunk flexed Gait velocity: Decreased Gait velocity interpretation: <1.31 ft/sec, indicative of household ambulator General Gait Details: Slow and generally steady. No assist required.  Stairs Stairs: Yes Stairs assistance: Modified independent (Device/Increase time) Stair Management: One rail Right;Alternating pattern;Forwards Number of Stairs: 10 General stair comments: No assist required. Slow and without unsteadiness.  Wheelchair Mobility    Modified Rankin (Stroke Patients Only)       Balance Overall balance assessment: Needs assistance Sitting-balance support: Feet supported Sitting balance-Leahy Scale: Good     Standing balance support: No upper extremity supported Standing balance-Leahy Scale: Fair                               Pertinent Vitals/Pain Pain Assessment: Faces Faces Pain Scale: Hurts a little bit Pain Location: back Pain Descriptors / Indicators: Discomfort Pain Intervention(s): Limited activity within patient's tolerance;Monitored during session;Repositioned    Home Living Family/patient expects to be discharged to:: Private residence Living Arrangements: Spouse/significant other;Children Available Help at Discharge: Family;Friend(s);Home health;Available 24 hours/day Type of Home: House Home Access: Level entry     Home Layout: One level Home Equipment: None Additional Comments: pt lives pt lives with wife who works during the day, 74 year old child, and mother lives near by who can assist as needed.    Prior Function Level of Independence: Independent         Comments: works and Dispensing optician  Extremity/Trunk Assessment   Upper Extremity Assessment Upper Extremity  Assessment: Defer to OT evaluation    Lower Extremity Assessment Lower Extremity Assessment: Generalized weakness (Consistent with pre-op diagnosis)    Cervical / Trunk Assessment Cervical / Trunk Assessment: Other exceptions Cervical / Trunk Exceptions: s/p back sx  Communication   Communication: No difficulties  Cognition Arousal/Alertness: Awake/alert Behavior During Therapy: WFL for tasks assessed/performed;Flat affect Overall Cognitive Status: Within Functional Limits for tasks assessed                                        General Comments General comments (skin integrity, edema, etc.): VSS on RA    Exercises     Assessment/Plan    PT Assessment Patent does not need any further PT services  PT Problem List         PT Treatment Interventions      PT Goals (Current goals can be found in the Care Plan section)  Acute Rehab PT Goals 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 PT Goal Formulation: All assessment and education complete, DC therapy    Frequency     Barriers to discharge        Co-evaluation               AM-PAC PT "6 Clicks" Mobility  Outcome Measure Help needed turning from your back to your side while in a flat bed without using bedrails?: None Help needed moving from lying on your back to sitting on the side of a flat bed without using bedrails?: None Help needed moving to and from a bed to a chair (including a wheelchair)?: None Help needed standing up from a chair using your arms (e.g., wheelchair or bedside chair)?: None Help needed to walk in hospital room?: None Help needed climbing 3-5 steps with a railing? : None 6 Click Score: 24    End of Session   Activity Tolerance: Patient tolerated treatment well Patient left: in bed;with call bell/phone within reach Nurse Communication: Mobility status PT Visit Diagnosis: Unsteadiness on feet (R26.81);Pain Pain - part of body:  (back)    Time:  5597-4163 PT Time Calculation (min) (ACUTE ONLY): 12 min   Charges:   PT Evaluation $PT Eval Low Complexity: 1 Low          [name], PT, DPT Acute Rehabilitation Services Pager: [phone] Office: [phone]   Thelma Comp 05/15/2021, 9:46 AM

## 2021-05-15 NOTE — Discharge Summary (Signed)
Discharge Summary  Date of Admission: 05/14/2021  Date of Discharge: 05/15/21  Attending Physician: Emelda Brothers, MD  Hospital Course: Patient was admitted following an uncomplicated Z1-6 MIS decompression, L5 vertebral body biopsy and vertebroplasty. He was recovered in PACU and transferred to [facility]. His hospital course was uncomplicated and the patient was discharged home on 05/15/21. He will follow up in clinic with me in 2 weeks.  Neurologic exam at discharge:  Strength 5/5 x4, SILTx4  Discharge diagnosis: Lumbar radiculopathy, L5 compression fracture  Judith Part, MD 05/15/21 9:07 AM

## 2021-05-16 LAB — SURGICAL PATHOLOGY

## 2021-05-16 NOTE — Anesthesia Postprocedure Evaluation (Signed)
Anesthesia Post Note  Patient: [name]  Procedure(s) Performed: Lumbar Four-Five Minimally Invasive Laminectomy (Spine Lumbar) Lumbar Five Vertebroplasty (Spine Lumbar)     Patient location during evaluation: PACU Anesthesia Type: General Level of consciousness: sedated and patient cooperative Pain management: pain level controlled Vital Signs Assessment: post-procedure vital signs reviewed and stable Respiratory status: spontaneous breathing Cardiovascular status: stable Anesthetic complications: no   No notable events documented.  Last Vitals:  Vitals:   05/15/21 0445 05/15/21 0742  BP: 114/60 115/72  Pulse: 63 65  Resp: 20 17  Temp: 36.6 C 36.7 C  SpO2: 95% 97%    Last Pain:  Vitals:   05/15/21 0800  TempSrc:   PainSc: [place]

## 2021-05-17 ENCOUNTER — Other Ambulatory Visit: Payer: Self-pay | Admitting: Internal Medicine

## 2021-05-20 ENCOUNTER — Ambulatory Visit (INDEPENDENT_AMBULATORY_CARE_PROVIDER_SITE_OTHER): Payer: BC Managed Care – PPO | Admitting: Internal Medicine

## 2021-05-20 ENCOUNTER — Other Ambulatory Visit: Payer: Self-pay

## 2021-05-20 ENCOUNTER — Encounter: Payer: Self-pay | Admitting: Internal Medicine

## 2021-05-20 VITALS — BP 116/76 | HR 66 | Ht 70.0 in | Wt 192.6 lb

## 2021-05-20 DIAGNOSIS — I428 Other cardiomyopathies: Secondary | ICD-10-CM

## 2021-05-20 DIAGNOSIS — Z95810 Presence of automatic (implantable) cardiac defibrillator: Secondary | ICD-10-CM | POA: Diagnosis not present

## 2021-05-20 NOTE — Progress Notes (Addendum)
[name] Patient Short Team: [name], [name], MD as PCP - General (Family Medicine) Deboraha Sprang, MD as PCP - Cardiology (Cardiology)   HPI  [name] is a 45 y.o. 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   He is status post catheter ablation [name] ).        The patient denies chest pain, shortness of breath, nocturnal dyspnea, orthopnea or peripheral edema.  There have been no palpitations, lightheadedness or syncope.    He was infected with Covid about six weeks ago. Subsequently he developed severe back pain and on 9/27 underwent a L4-5 laminectomy. His nerve pain seems to have improved but he continues to suffer from back pain.   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 10/10 and [facility] 2013    Past Surgical History:  Procedure Laterality Date   CARDIAC CATHETERIZATION  06-01-06   CARDIAC DEFIBRILLATOR PLACEMENT  06-03-06   Medtronic   ICD GENERATOR CHANGEOUT N/A 11/04/2019   Procedure: ICD [place];  Surgeon: Deboraha Sprang, MD;  Location: [place] [facility];  Service: Cardiovascular;  Laterality: N/A;   IMPLANTABLE CARDIOVERTER DEFIBRILLATOR GENERATOR CHANGE N/A 12/11/2011   Procedure: IMPLANTABLE CARDIOVERTER DEFIBRILLATOR GENERATOR CHANGE;  Surgeon: Deboraha Sprang, MD;  Location: [facility] CATH LAB;  Service: Cardiovascular;  Laterality: N/A;   KYPHOPLASTY N/A 05/14/2021   Procedure: Lumbar  Five Vertebroplasty;  Surgeon: Judith Part, MD;  Location: [place];  Service: Neurosurgery;  Laterality: N/A;   LUMBAR LAMINECTOMY/ DECOMPRESSION WITH MET-RX N/A 05/14/2021   Procedure: Lumbar Four-Five Minimally Invasive Laminectomy;  Surgeon: Judith Part, MD;  Location: [place];  Service: Neurosurgery;  Laterality: N/A;  3C/RM 19   vt ablation  10/10, 10/13   x 2    Current Outpatient Medications  Medication Sig Dispense Refill   Ascorbic Acid (VITAMIN C) 1000 MG tablet Take 1,000 mg by mouth at bedtime.     cetirizine (ZYRTEC) 10 MG tablet Take 10 mg by mouth daily.     Cholecalciferol (VITAMIN D3) 50 MCG (2000 UT) TABS Take 2,000 Units by mouth 2 (two) times daily.     KLOR-CON M10 10 MEQ tablet TAKE 4 TABLETS BY MOUTH TWICE A DAY 720 tablet 3   magnesium oxide (MAG-OX) 400 MG tablet TAKE 1 TABLET BY MOUTH TWICE A DAY 180 tablet 3   montelukast (SINGULAIR) 10 MG tablet TAKE 1 TABLET BY MOUTH EVERY DAY 90 tablet 1   Multiple Vitamin (MULITIVITAMIN WITH MINERALS) TABS Take 1 tablet by mouth daily.     oxyCODONE-acetaminophen (PERCOCET/ROXICET) 5-325 MG tablet Take 1 tablet by mouth every 4 (four) hours as needed (pain). 6 tablet 0   SOTALOL AF 120 MG TABS TAKE 1 & 1/2 TABLETS BY MOUTH TWICE DAILY 270 tablet 3   tadalafil (CIALIS)  5 MG tablet Take 5 mg by mouth daily as needed for erectile dysfunction.     vitamin E 180 MG (400 UNITS) capsule Take 400 Units by mouth at bedtime.     Zinc 30 MG CAPS Take 30 mg by mouth at bedtime.     No current facility-administered medications for this visit.    Allergies  Allergen Reactions   Bee Venom Anaphylaxis   Lidocaine     [name]    Review of Systems negative except from HPI and PMH  Physical Exam BP 116/76   Pulse 66   Ht '5\' 10"'  (1.778 m)   Wt 192 lb 9.6 oz (87.4 kg)   SpO2 97%   BMI 27.64 kg/m  Well developed and well nourished in no acute distress HENT normal Neck supple with JVP-flat Clear Device pocket well  healed; without hematoma or erythema.  There is no tethering  Regular rate and rhythm, no  gallop No murmur Abd-soft with active BS No Clubbing cyanosis  edema Skin-warm and dry A & Oriented  Grossly normal sensory and motor function  ECG sinus at 66 Intervals 20/09/42 T wave inversion V1-V3   Assessment and  Plan  ARVC-TMEM gene Positive  VT    ICD   Medtronic   Sinus bradycardia  High Risk Medication Surveillance-sotalol  PTSD    Fatigue    No intercurrent ventricular tachycardia  Surveillance laboratories on sotalol 180 twice daily are normal as of 9/22 QT interval also within range  \Last potassium was just a little bit low.  Continue 40 twice daily.  Also increase his potassium foods.     I,Mathew Stumpf,acting as a scribe for [name], MD.,have documented all relevant documentation on the behalf of [name], MD,as directed by  [name], MD while in the presence of [name], MD.   I, [name], MD, have reviewed all documentation for this visit. The documentation on 05/20/21 for the exam, diagnosis, procedures, and orders are all accurate and complete.

## 2021-05-20 NOTE — Patient Instructions (Signed)

## 2021-05-22 ENCOUNTER — Ambulatory Visit (HOSPITAL_COMMUNITY): Payer: BC Managed Care – PPO

## 2021-05-22 ENCOUNTER — Encounter (HOSPITAL_COMMUNITY): Payer: Self-pay

## 2021-05-23 ENCOUNTER — Other Ambulatory Visit: Payer: Self-pay

## 2021-05-23 ENCOUNTER — Ambulatory Visit
Admission: RE | Admit: 2021-05-23 | Discharge: 2021-05-23 | Disposition: A | Discharge status: Home / Self Care | Payer: BC Managed Care – PPO | Source: Ambulatory Visit | Attending: Neurological Surgery | Admitting: Neurological Surgery

## 2021-05-23 ENCOUNTER — Encounter: Payer: Self-pay | Admitting: *Deleted

## 2021-05-23 DIAGNOSIS — S32009S Unspecified fracture of unspecified lumbar vertebra, sequela: Secondary | ICD-10-CM

## 2021-06-03 DIAGNOSIS — D1801 Hemangioma of skin and subcutaneous tissue: Secondary | ICD-10-CM | POA: Insufficient documentation

## 2021-06-03 DIAGNOSIS — L851 Acquired keratosis [keratoderma] palmaris et plantaris: Secondary | ICD-10-CM | POA: Insufficient documentation

## 2021-06-03 DIAGNOSIS — L578 Other skin changes due to chronic exposure to nonionizing radiation: Secondary | ICD-10-CM | POA: Insufficient documentation

## 2021-06-03 DIAGNOSIS — A630 Anogenital (venereal) warts: Secondary | ICD-10-CM | POA: Insufficient documentation

## 2021-06-04 ENCOUNTER — Ambulatory Visit (INDEPENDENT_AMBULATORY_CARE_PROVIDER_SITE_OTHER): Payer: BC Managed Care – PPO | Admitting: Gastroenterology

## 2021-06-04 ENCOUNTER — Encounter: Payer: Self-pay | Admitting: Gastroenterology

## 2021-06-04 ENCOUNTER — Other Ambulatory Visit: Payer: Self-pay

## 2021-06-04 VITALS — BP 99/64 | HR 64 | Temp 98.7°F | Ht 70.0 in | Wt 194.8 lb

## 2021-06-04 DIAGNOSIS — K851 Biliary acute pancreatitis without necrosis or infection: Secondary | ICD-10-CM | POA: Diagnosis not present

## 2021-06-04 NOTE — Progress Notes (Signed)
Gastroenterology Consultation  Referring Provider:     Abner Greenspan, MD Primary Care Physician:  [name], [name], MD Primary Gastroenterologist:  Dr. Allen Norris     Reason for Consultation:     Pancreatitis        HPI:   [name] is a 45 y.o. y/o male referred for consultation & management of pancreatitis by Dr. [name], [name], MD. this patient comes in today with a history of having pancreatitis back in June with his lipase being over thousand.  The patient was seen at [facility] and had a CT scan that showed:  Impression: -Finding consistent with acute interstitial pancreatitis. Inflammatory changes involving the surrounding stomach, duodenum and transverse colon. No peripancreatic fluid collections or SMV thrombosis.   -Mild bladder wall thickening. Recommend correlation with urinalysis to exclude acute cystitis.   -Sigmoid colon diverticulosis without evidence of acute diverticulitis.   -Incidental appendicolith. No evidence of acute appendicitis.  The patient had elevated liver enzymes back in June when the lipase was elevated but the most recent lipase was 36.  The patient's most recent liver enzymes showed:  Component     Latest Ref Rng & Units 03/07/2021  AST     0 - 37 U/L 19  ALT     0 - 53 U/L 22  Total Protein     6.0 - 8.3 g/dL 7.1  Albumin     3.5 - 5.2 g/dL 4.5   In 2010 the patient had a ultrasound that showed:  IMPRESSION:  Multiple gallstones without biliary obstruction.     Hepatomegaly with fatty infiltration of the liver.   Despite all the above findings the patient has been told that his Pancreatitis was idiopathic.  On the CT scan and there were stones seen in the gallbladder.  The patient denies any alcohol abuse.  The patient does have a history of right ventricular cardiomyopathy and is on multiple medications for that.  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   Asthma when younger   History of chicken pox    ICD (implantable cardiac defibrillator) in place    Sleep apnea    uses cpap   Ventricular tachycardia    sotalol therapy;  catheter ablation at [facility] 10/10 and [facility] 2013    Past Surgical History:  Procedure Laterality Date   CARDIAC CATHETERIZATION  06-01-06   CARDIAC DEFIBRILLATOR PLACEMENT  06-03-06   Medtronic   ICD GENERATOR CHANGEOUT N/A 11/04/2019   Procedure: ICD [place];  Surgeon: Deboraha Sprang, MD;  Location: [place] [facility];  Service: Cardiovascular;  Laterality: N/A;   IMPLANTABLE CARDIOVERTER DEFIBRILLATOR GENERATOR CHANGE N/A 12/11/2011   Procedure: IMPLANTABLE CARDIOVERTER DEFIBRILLATOR GENERATOR CHANGE;  Surgeon: Deboraha Sprang, MD;  Location: [facility] CATH LAB;  Service: Cardiovascular;  Laterality: N/A;   KYPHOPLASTY N/A 05/14/2021   Procedure: Lumbar Five Vertebroplasty;  Surgeon: Judith Part, MD;  Location: [place];  Service: Neurosurgery;  Laterality: N/A;   LUMBAR LAMINECTOMY/ DECOMPRESSION WITH MET-RX N/A 05/14/2021   Procedure: Lumbar Four-Five Minimally Invasive Laminectomy;  Surgeon: Judith Part, MD;  Location: [place];  Service: Neurosurgery;  Laterality: N/A;  3C/RM 19   vt ablation  10/10, 10/13   x 2    Prior to Admission medications   Medication Sig Start Date End Date Taking? Authorizing Provider  Ascorbic Acid (VITAMIN C) 1000 MG tablet Take 1,000 mg by mouth at bedtime.    [provider]  cetirizine (ZYRTEC) 10 MG tablet Take 10 mg by mouth daily.    [provider]  Cholecalciferol (VITAMIN D3) 50 MCG (2000 UT) TABS Take 2,000 Units by mouth 2 (two) times daily.    [provider]  KLOR-CON M10 10 MEQ tablet TAKE 4 TABLETS BY MOUTH TWICE A DAY 05/23/20   Deboraha Sprang, MD  magnesium oxide (MAG-OX) 400 MG tablet TAKE 1 TABLET BY MOUTH TWICE A DAY 08/15/20   Deboraha Sprang, MD  montelukast (SINGULAIR) 10  MG tablet TAKE 1 TABLET BY MOUTH EVERY DAY 02/27/21   [name], [name], MD  Multiple Vitamin (MULITIVITAMIN WITH MINERALS) TABS Take 1 tablet by mouth daily.    [provider]  oxyCODONE-acetaminophen (PERCOCET/ROXICET) 5-325 MG tablet Take 1 tablet by mouth every 4 (four) hours as needed (pain). 05/14/21   Judith Part, MD  SOTALOL AF 120 MG TABS TAKE 1 & 1/2 TABLETS BY MOUTH TWICE DAILY 05/21/20   Deboraha Sprang, MD  tadalafil (CIALIS) 5 MG tablet Take 5 mg by mouth daily as needed for erectile dysfunction.    [provider]  vitamin E 180 MG (400 UNITS) capsule Take 400 Units by mouth at bedtime.    [provider]  Zinc 30 MG CAPS Take 30 mg by mouth at bedtime.    [provider]    Family History  Problem Relation Age of Onset   Sudden death Father    Asthma Mother    Diabetes Other    Stroke Other    Heart attack Other    Prostate cancer Other    Breast cancer Other    Ovarian cancer Other    Uterine cancer Other    Colon cancer Other    Drug abuse Other    Depression Other    Lung cancer Maternal Grandfather    Heart disease Maternal Grandfather    Heart disease Paternal Grandfather    Hypertension Paternal Grandmother      Social History   Tobacco Use   Smoking status: Former    Packs/day: 1.00    Years: 18.00    Pack years: 18.00    Types: Cigarettes    Start date: 01/17/1990    Quit date: 08/19/2007    Years since quitting: 13.8   Smokeless tobacco: Never  Vaping Use   Vaping Use: Never used  Substance Use Topics   Alcohol use: No    Alcohol/week: 0.0 standard drinks   Drug use: No    Allergies as of 06/04/2021 - Review Complete 05/20/2021  Allergen Reaction Noted   Bee venom Anaphylaxis 02/21/2014   Lidocaine  12/04/2011    Review of Systems:    All systems reviewed and negative except where noted in HPI.   Physical Exam:  There were no vitals taken for this visit. No LMP for male patient. General:   Alert,   Well-developed, well-nourished, pleasant and cooperative in NAD Head:  Normocephalic and atraumatic. Eyes:  Sclera clear, no icterus.   Conjunctiva pink. Ears:  Normal auditory acuity. Neck:  Supple; no masses or thyromegaly. Lungs:  Respirations even and unlabored.  Clear throughout to auscultation.   No wheezes, crackles, or rhonchi. No acute distress. Heart:  Regular rate and rhythm; no murmurs, clicks, rubs, or gallops. Abdomen:  Normal bowel sounds.  No bruits.  Soft, non-tender and non-distended without  masses, hepatosplenomegaly or hernias noted.  No guarding or rebound tenderness.  Negative Carnett sign.   Rectal:  Deferred.  Pulses:  Normal pulses noted. Extremities:  No clubbing or edema.  No cyanosis. Neurologic:  Alert and oriented x3;  grossly normal neurologically. Skin:  Intact without significant lesions or rashes.  No jaundice. Lymph Nodes:  No significant cervical adenopathy. Psych:  Alert and cooperative. Normal mood and affect.  Imaging Studies: DG Lumbar Spine 2-3 Views  Result Date: 05/14/2021 CLINICAL DATA:  L5 fracture. EXAM: LUMBAR SPINE - 2-3 VIEW; DG C-ARM 1-60 MIN-NO REPORT COMPARISON:  Lumbar spine MRI 04/30/2021 FINDINGS: Fluoroscopic spot images demonstrate vertebroplasty changes at L5 the right. No complicating features are identified. IMPRESSION: L5 vertebroplasty changes without complicating features. Electronically Signed   By: Marijo Sanes M.D.   On: 05/14/2021 19:43   DG C-Arm 1-60 Min-No Report  Result Date: 05/14/2021 CLINICAL DATA:  L5 fracture. EXAM: LUMBAR SPINE - 2-3 VIEW; DG C-ARM 1-60 MIN-NO REPORT COMPARISON:  Lumbar spine MRI 04/30/2021 FINDINGS: Fluoroscopic spot images demonstrate vertebroplasty changes at L5 the right. No complicating features are identified. IMPRESSION: L5 vertebroplasty changes without complicating features. Electronically Signed   By: Marijo Sanes M.D.   On: 05/14/2021 19:43   CUP PACEART REMOTE DEVICE CHECK  Result  Date: 05/06/2021 Scheduled remote reviewed. Normal device function.  Next remote 91 days. LR Scheduled remote reviewed. Normal device function.  Previous transmission show 19 NSVT, and numerous AF episodes which appear to be SR with frequent ectopy.  No therapies delivered.  Next remote 91 days. LR  DG MOBILE BONE DENSITY  Result Date: 05/24/2021 CLINICAL DATA:  45 year old male.  History of lumbar spine surgery. EXAM: DUAL X-RAY ABSORPTIOMETRY (DXA) FOR BONE MINERAL DENSITY TECHNIQUE: Bone mineral density measurements are performed of the spine, hip, and forearm, as appropriate, per International Society of Clinical Densitometry recommendations. The pertinent regions of interest are reported below. Non-contributory values are not reported. Images are obtained for bone mineral density measurement and are not obtained for diagnostic purposes. FINDINGS: LEFT FOREARM (1/3 RADIUS) Bone Mineral Density (BMD):  0.677 Young Adult T Score:  -2.6 Z Score:  -2.4 LEFT FEMUR NECK Bone Mineral Density (BMD):  0.687 g/cm2 Z-Score:  -1.2 Unit: This study was performed at [facility] on the [place] (S/N ([phone]), software version 13.4.2. Scan quality: Good.  Lumbar spine excluded due to prior surgery. ASSESSMENT: The patient's Z-score is below expected range for age FRACTURE RISK:  INCREASED FRAX: World Health Organization FRAX assessment of absolute fracture risk is not calculated for this patient because of young age. COMPARISON: None. RECOMMENDATIONS 1. All patients should optimize calcium and vitamin D intake. 2. Patients with diagnosis of osteoporosis or at high risk for fracture should have regular bone mineral density tests.? For patients eligible for Medicare, routine testing is allowed once every 2 years.? The testing frequency can be increased to one year for patients who have rapidly progressing disease, those who are receiving or discontinuing medical therapy to restore bone mass, or have  additional risk factors. Electronically Signed   By: Lajean Manes M.D.   On: 05/24/2021 10:29    Assessment and Plan:   [name] is a 45 y.o. y/o male who comes in today with a diagnosis of pancreatitis.  It is more likely that this is gallstone Melida Gimenez and not idiopathic since the patient has had gallstones seen on different imaging dating back to 2010. The patient has been  told that he should strongly consider having his gallbladder out to decrease the risk of recurrent pancreatitis.  The patient reports that he has been through a lot of things this year including the pancreatitis and orthopedic spinal surgery.  He will contact his work team and see if there is a time he can take oral to have his gallbladder removed and then will contact us for referral if he decides to have it done at [facility] otherwise he may do it at [facility].  The patient has been explained the plan and agrees with it.    Lucilla Lame, MD. Marval Regal    Note: This dictation was prepared with Dragon dictation along with smaller phrase technology. Any transcriptional errors that result from this process are unintentional.

## 2021-06-07 ENCOUNTER — Encounter: Payer: Self-pay | Admitting: Family Medicine

## 2021-06-13 ENCOUNTER — Telehealth: Payer: Self-pay

## 2021-06-13 DIAGNOSIS — K8020 Calculus of gallbladder without cholecystitis without obstruction: Secondary | ICD-10-CM

## 2021-06-13 NOTE — Telephone Encounter (Signed)
Patient advised through [facility]

## 2021-06-13 NOTE — Telephone Encounter (Signed)
I put the referral through  [facility]ified [facility]  I would recommend Dr Brantley Stage (he did my gallbladder surgery)  If not available anyone there is good

## 2021-06-13 NOTE — Telephone Encounter (Signed)
Dr [name], this is the response from patient in regards to your mychart message to him earlier this week.  "Hey Dr. [name] -   Thank you for the feedback - Do you have a recommendation for a general surgeon at [facility]? Dr. Allen Norris is familiar with surgeons at [facility] but I would prefer to do it at [facility] if possible. If I had a name they could send a referral."

## 2021-06-14 NOTE — Telephone Encounter (Signed)
Referral faxed and pt made aware

## 2021-06-15 ENCOUNTER — Other Ambulatory Visit: Payer: Self-pay | Admitting: Internal Medicine

## 2021-06-18 ENCOUNTER — Encounter: Payer: Self-pay | Admitting: Family Medicine

## 2021-06-18 DIAGNOSIS — M8588 Other specified disorders of bone density and structure, other site: Secondary | ICD-10-CM

## 2021-06-18 DIAGNOSIS — M8580 Other specified disorders of bone density and structure, unspecified site: Secondary | ICD-10-CM | POA: Insufficient documentation

## 2021-06-24 ENCOUNTER — Encounter: Payer: Self-pay | Admitting: Internal Medicine

## 2021-06-24 ENCOUNTER — Ambulatory Visit (INDEPENDENT_AMBULATORY_CARE_PROVIDER_SITE_OTHER): Payer: BC Managed Care – PPO | Admitting: Internal Medicine

## 2021-06-24 ENCOUNTER — Other Ambulatory Visit: Payer: Self-pay

## 2021-06-24 VITALS — BP 120/70 | HR 72 | Ht 70.0 in | Wt 196.0 lb

## 2021-06-24 DIAGNOSIS — M859 Disorder of bone density and structure, unspecified: Secondary | ICD-10-CM | POA: Diagnosis not present

## 2021-06-24 DIAGNOSIS — E876 Hypokalemia: Secondary | ICD-10-CM
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
## 2021-06-24 NOTE — Patient Instructions (Addendum)
Please make sure you are consuming 1200 mg of calcium daily    24-Hour Urine Collection  You will be collecting your urine for a 24-hour period of time. Your timer starts with your first urine of the morning (For example - If you first pee at [place], your timer will start at [place]) [place] away your first urine of the morning Collect your urine every time you pee for the next 24 hours STOP your urine collection 24 hours after you started the collection (For example - You would stop at 9AM the day after you started)

## 2021-06-26 ENCOUNTER — Other Ambulatory Visit: Payer: BC Managed Care – PPO

## 2021-06-28 ENCOUNTER — Other Ambulatory Visit: Payer: Self-pay

## 2021-06-28 ENCOUNTER — Other Ambulatory Visit (INDEPENDENT_AMBULATORY_CARE_PROVIDER_SITE_OTHER): Payer: BC Managed Care – PPO

## 2021-06-28 DIAGNOSIS — M859 Disorder of bone density and structure, unspecified: Secondary | ICD-10-CM

## 2021-06-28 LAB — COMPREHENSIVE METABOLIC PANEL
ALT: 53 U/L (ref 0–53)
AST: 29 U/L (ref 0–37)
Albumin: 4.6 g/dL (ref 3.5–5.2)
Alkaline Phosphatase: 48 U/L (ref 39–117)
BUN: 7 mg/dL (ref 6–23)
CO2: 31 mEq/L (ref 19–32)
Calcium: 9.8 mg/dL (ref 8.4–10.5)
Chloride: 104 mEq/L (ref 96–112)
Creatinine, Ser: 0.72 mg/dL (ref 0.40–1.50)
GFR: 112.09 mL/min (ref >60.00)
Glucose, Bld: 98 mg/dL (ref 70–99)
Potassium: 4.2 mEq/L (ref 3.5–5.1)
Sodium: 142 mEq/L (ref 135–145)
Total Bilirubin: 0.5 mg/dL (ref 0.2–1.2)
Total Protein: 7.4 g/dL (ref 6.0–8.3)

## 2021-06-28 LAB — VITAMIN D 25 HYDROXY (VIT D DEFICIENCY, FRACTURES): VITD: 39.46 ng/mL (ref 30.00–100.00)

## 2021-06-28 LAB — PHOSPHORUS: Phosphorus: 3.3 mg/dL (ref 2.3–4.6)

## 2021-06-28 LAB — T4, FREE: Free T4: 0.92 ng/dL (ref 0.60–1.60)

## 2021-06-28 LAB — MAGNESIUM: Magnesium: 2 mg/dL (ref 1.5–2.5)

## 2021-06-28 LAB — TSH: TSH: 1.13 u[IU]/mL (ref 0.35–5.50)

## 2021-07-01 ENCOUNTER — Other Ambulatory Visit: Payer: BC Managed Care – PPO

## 2021-07-01 DIAGNOSIS — M859 Disorder of bone density and structure, unspecified: Secondary | ICD-10-CM

## 2021-07-01 LAB — PARATHYROID HORMONE, INTACT (NO CA): PTH: 34 pg/mL (ref 16–77)

## 2021-07-01 LAB — TESTOSTERONE, TOTAL, LC/MS/MS: Testosterone, Total, LC-MS-MS: 574 ng/dL (ref 250–1100)

## 2021-07-03 NOTE — Addendum Note (Signed)
Addended by: Dorita Sciara on: 07/03/2021 12:34 PM   Modules accepted: Orders

## 2021-07-08 ENCOUNTER — Encounter: Payer: Self-pay | Admitting: Internal Medicine

## 2021-07-09 ENCOUNTER — Other Ambulatory Visit: Payer: Self-pay

## 2021-07-09 ENCOUNTER — Ambulatory Visit (INDEPENDENT_AMBULATORY_CARE_PROVIDER_SITE_OTHER): Payer: BC Managed Care – PPO

## 2021-07-09 DIAGNOSIS — Z23 Encounter for immunization: Secondary | ICD-10-CM

## 2021-07-09 NOTE — Progress Notes (Signed)
Per orders of Dr. [name], an injection of Gardasil-9 (HPV #3) was given by Ophelia Shoulder. Patient tolerated injection well.

## 2021-07-11 LAB — GLIA (IGA/G) + TTG IGA
Antigliadin Abs, IgA: 5 units (ref 0–19)
Gliadin IgG: 2 units (ref 0–19)
Transglutaminase IgA: <2 U/mL (ref 0–3)

## 2021-07-11 LAB — ALDOSTERONE + RENIN ACTIVITY W/ RATIO
ALDOS/RENIN RATIO: 4.4 (ref 0.0–30.0)
ALDOSTERONE: 4.1 ng/dL (ref 0.0–30.0)
Renin: 0.926 ng/mL/hr (ref 0.167–5.380)

## 2021-07-13 LAB — CALCIUM, URINE, 24 HOUR: Calcium, 24 hour urine: 231 mg/24 h (ref 55–300)

## 2021-07-13 LAB — CORTISOL, FREE AND CORTISONE, 24 HOUR URINE W/CREATININE
24 Hour urine volume (VMAHVA): 3000 mL
CREATININE, URINE: 1.58 g/(24.h) (ref 0.50–2.15)
Cortisol (Ur), Free: 63.8 mcg/24 h — ABNORMAL HIGH (ref 4.0–50.0)
Cortisone, 24H Ur: 233.7 mcg/24 h — ABNORMAL HIGH (ref 23–195)

## 2021-07-13 LAB — TEST AUTHORIZATION

## 2021-07-14 ENCOUNTER — Other Ambulatory Visit: Payer: Self-pay | Admitting: Internal Medicine

## 2021-07-15 ENCOUNTER — Telehealth: Payer: Self-pay | Admitting: Internal Medicine

## 2021-07-15 DIAGNOSIS — R82998 Other abnormal findings in urine: Secondary | ICD-10-CM

## 2021-07-15 MED ORDER — DEXAMETHASONE 1 MG PO TABS: 1.0000 mg | ORAL_TABLET | Freq: Once | ORAL | 0 refills | Status: AC

## 2021-07-15 NOTE — Telephone Encounter (Signed)
  Discussed abnormla 24- hr urinary results with the  pt on on 07/15/2021   Will proceed with dexamethasone suppression test     Instructions for Dexamethasone Suppression Test   Step 1: Choose a morning when you can come to our lab at 8:00 am for a blood draw.   Step 2: On the night before the blood draw, take one 1 mg tablet of dexamethasone at 11:30 pm.  The timing is VERY important!   Step 3: The next morning, go to the lab for blood work at 8:00 am.  Dennis Bast do not have to be on an empty stomach, but the timing is VERY important!   Pt will come tomorrow at 8 AM for labs    Lowman, MD  [facility] Endocrinology  [facility] [address] [place], [place] [address] Phone: [phone] FAX: [phone]

## 2021-07-16 ENCOUNTER — Other Ambulatory Visit (INDEPENDENT_AMBULATORY_CARE_PROVIDER_SITE_OTHER): Payer: BC Managed Care – PPO

## 2021-07-16 ENCOUNTER — Other Ambulatory Visit: Payer: Self-pay

## 2021-07-16 DIAGNOSIS — R82998 Other abnormal findings in urine: Secondary | ICD-10-CM | POA: Diagnosis not present

## 2021-07-16 DIAGNOSIS — M859 Disorder of bone density and structure, unspecified: Secondary | ICD-10-CM

## 2021-07-16 LAB — CORTISOL: Cortisol, Plasma: 0.6 ug/dL

## 2021-07-18 LAB — PROTEIN ELECTROPHORESIS, SERUM
Albumin ELP: 4.4 g/dL (ref 3.8–4.8)
Alpha 1: 0.2 g/dL (ref 0.2–0.3)
Alpha 2: 0.5 g/dL (ref 0.5–0.9)
Beta 2: 0.4 g/dL (ref 0.2–0.5)
Beta Globulin: 0.4 g/dL (ref 0.4–0.6)
Gamma Globulin: 1.1 g/dL (ref 0.8–1.7)
Total Protein: 6.9 g/dL (ref 6.1–8.1)

## 2021-07-19 LAB — PROTEIN ELECTROPHORESIS, URINE REFLEX
Albumin ELP, Urine: 23.7 %
Alpha-1-Globulin, U: 6 %
Alpha-2-Globulin, U: 15 %
Beta Globulin, U: 42.7 %
Gamma Globulin, U: 12.7 %
Protein, Ur: 19.5 mg/dL

## 2021-07-22 ENCOUNTER — Telehealth: Payer: Self-pay | Admitting: *Deleted

## 2021-07-22 ENCOUNTER — Ambulatory Visit: Payer: Self-pay | Admitting: Surgery

## 2021-07-22 DIAGNOSIS — K851 Biliary acute pancreatitis without necrosis or infection: Secondary | ICD-10-CM

## 2021-07-22 NOTE — Progress Notes (Addendum)
COVID swab appointment: n/a  COVID Vaccine Completed: yes x2 Date COVID Vaccine completed: 08/09/19, 08/30/19 Has received booster: COVID vaccine manufacturer: [place]      Date of COVID positive in last 90 days: no  PCP - Loura Pardon, MD Cardiologist - [name], MD  Chest x-ray - n/a EKG - 05/20/21 Epic Stress Test - years ago per pt ECHO - 06/06/19 Epic Cardiac Cath - 2007 Pacemaker/ICD device last checked: 05/06/21 Epic Spinal Cord Stimulator: no  Sleep Study - yes positive CPAP - yes, most nights  Fasting Blood Sugar - n/a Checks Blood Sugar _____ times a day  Blood Thinner Instructions: n/a Aspirin Instructions: Last Dose:  Activity level: Can go up a flight of stairs and perform activities of daily living without stopping and without symptoms of chest pain or shortness of breath. Occasional CP with activity, has ARVC    Anesthesia review: cardiomyopathy, OSA, fatty liver, V tach, ICD  Patient denies shortness of breath, fever, cough and chest pain at PAT appointment   Patient verbalized understanding of instructions that were given to them at the PAT appointment. Patient was also instructed that they will need to review over the PAT instructions again at home before surgery.

## 2021-07-22 NOTE — Telephone Encounter (Signed)
   Pre-operative Risk Assessment    Patient Name: [name]  DOB: Jun 06, 1976 MRN: [id number]      Request for Surgical Clearance   Procedure:   GALLBLADDER SURGERY  Date of Surgery: Clearance TBD                                 Surgeon:  DR. Erroll Luna Surgeon's Group or Practice Name:  [facility] Phone number:  [phone] Fax number:  [phone] ATTN: Carlene Coria, CMA   Type of Clearance Requested: - Medical    Type of Anesthesia:   General    Additional requests/questions:   Jiles Prows   07/22/2021, 1:23 PM

## 2021-07-23 ENCOUNTER — Telehealth: Payer: Self-pay | Admitting: Internal Medicine

## 2021-07-23 ENCOUNTER — Encounter (HOSPITAL_COMMUNITY): Payer: Self-pay

## 2021-07-23 ENCOUNTER — Encounter: Payer: Self-pay | Admitting: Internal Medicine

## 2021-07-23 ENCOUNTER — Other Ambulatory Visit: Payer: Self-pay

## 2021-07-23 ENCOUNTER — Encounter (HOSPITAL_COMMUNITY)
Admission: RE | Admit: 2021-07-23 | Discharge: 2021-07-23 | Disposition: A | Discharge status: Home / Self Care | Payer: BC Managed Care – PPO | Source: Ambulatory Visit | Attending: Surgery | Admitting: Surgery

## 2021-07-23 DIAGNOSIS — K851 Biliary acute pancreatitis without necrosis or infection: Secondary | ICD-10-CM | POA: Diagnosis not present

## 2021-07-23 DIAGNOSIS — Z95810 Presence of automatic (implantable) cardiac defibrillator: Secondary | ICD-10-CM | POA: Insufficient documentation

## 2021-07-23 DIAGNOSIS — G4730 Sleep apnea, unspecified: Secondary | ICD-10-CM | POA: Diagnosis not present

## 2021-07-23 DIAGNOSIS — Z87891 Personal history of nicotine dependence: Secondary | ICD-10-CM | POA: Diagnosis not present

## 2021-07-23 DIAGNOSIS — Z01812 Encounter for preprocedural laboratory examination: Secondary | ICD-10-CM | POA: Insufficient documentation

## 2021-07-23 HISTORY — DX: Personal history of urinary calculi: Z87.442

## 2021-07-23 HISTORY — DX: Acute pancreatitis without necrosis or infection, unspecified: K85.90

## 2021-07-23 HISTORY — DX: Fatty (change of) liver, not elsewhere classified: K76.0

## 2021-07-23 HISTORY — DX: Other specified disorders of bone density and structure, unspecified site: M85.80

## 2021-07-23 LAB — CBC WITH DIFFERENTIAL/PLATELET
Abs Immature Granulocytes: 0.05 10*3/uL (ref 0.00–0.07)
Basophils Absolute: 0.1 10*3/uL (ref 0.0–0.1)
Basophils Relative: 1 %
Eosinophils Absolute: 0.2 10*3/uL (ref 0.0–0.5)
Eosinophils Relative: 2 %
HCT: 41.1 % (ref 39.0–52.0)
Hemoglobin: 13.6 g/dL (ref 13.0–17.0)
Immature Granulocytes: 1 %
Lymphocytes Relative: 34 %
Lymphs Abs: 2.5 10*3/uL (ref 0.7–4.0)
MCH: 30.4 pg (ref 26.0–34.0)
MCHC: 33.1 g/dL (ref 30.0–36.0)
MCV: 91.9 fL (ref 80.0–100.0)
Monocytes Absolute: 0.8 10*3/uL (ref 0.1–1.0)
Monocytes Relative: 10 %
Neutro Abs: 3.8 10*3/uL (ref 1.7–7.7)
Neutrophils Relative %: 52 %
Platelets: 206 10*3/uL (ref 150–400)
RBC: 4.47 MIL/uL (ref 4.22–5.81)
RDW: 13 % (ref 11.5–15.5)
WBC: 7.4 10*3/uL (ref 4.0–10.5)
nRBC: 0 % (ref 0.0–0.2)

## 2021-07-23 LAB — COMPREHENSIVE METABOLIC PANEL
ALT: 34 U/L (ref 0–44)
AST: 29 U/L (ref 15–41)
Albumin: 4.4 g/dL (ref 3.5–5.0)
Alkaline Phosphatase: 42 U/L (ref 38–126)
Anion gap: 5 (ref 5–15)
BUN: 5 mg/dL — ABNORMAL LOW (ref 6–20)
CO2: 28 mmol/L (ref 22–32)
Calcium: 9.3 mg/dL (ref 8.9–10.3)
Chloride: 106 mmol/L (ref 98–111)
Creatinine, Ser: 0.58 mg/dL — ABNORMAL LOW (ref 0.61–1.24)
GFR, Estimated: >60 mL/min (ref >60)
Glucose, Bld: 98 mg/dL (ref 70–99)
Potassium: 3.8 mmol/L (ref 3.5–5.1)
Sodium: 139 mmol/L (ref 135–145)
Total Bilirubin: 0.7 mg/dL (ref 0.3–1.2)
Total Protein: 7.3 g/dL (ref 6.5–8.1)

## 2021-07-23 MED ORDER — ALENDRONATE SODIUM 70 MG PO TABS: 70.0000 mg | ORAL_TABLET | ORAL | 11 refills | Status: DC

## 2021-07-23 MED ORDER — ALENDRONATE SODIUM 70 MG PO TABS: 70.0000 mg | ORAL_TABLET | ORAL | 3 refills | Status: DC

## 2021-07-23 NOTE — Patient Instructions (Signed)
DUE TO COVID-19 ONLY ONE VISITOR IS ALLOWED TO COME WITH YOU AND STAY IN THE WAITING ROOM ONLY DURING PRE OP AND PROCEDURE.   **NO VISITORS ARE ALLOWED IN THE SHORT STAY AREA OR RECOVERY ROOM!!**       Your procedure is scheduled on: 08/01/21   Report to [facility] Main Entrance    Report to admitting at 8:00 AM   Call this number if you have problems the morning of surgery [phone]   Do not eat food :After Midnight.   May have liquids until 7:15 AM day of surgery  CLEAR LIQUID DIET  Foods Allowed                                                                     Foods Excluded  Water, Black Coffee and tea (no milk or creamer)            liquids that you cannot  Plain Jell-O in any flavor  (No red)                                     see through such as: Fruit ices (not with fruit pulp)                                            milk, soups, orange juice              Iced Popsicles (No red)                                              All solid food                                   Apple juices Sports drinks like Gatorade (No red) Lightly seasoned clear broth or consume(fat free) Sugar   Oral Hygiene is also important to reduce your risk of infection.                                    Remember - BRUSH YOUR TEETH THE MORNING OF SURGERY WITH YOUR REGULAR TOOTHPASTE   Do NOT smoke after Midnight   Take these medicines the morning of surgery with A SIP OF WATER: Zyrtec, Singulair, Sotalol, Percocet                              You may not have any metal on your body including  jewelry, and body piercing             Do not wear lotions, powders, cologne, or deodorant              Men may shave face and neck.   Do not bring valuables to the hospital. [facility]  IS NOT             RESPONSIBLE   FOR VALUABLES.    Patients discharged on the day of surgery will not be allowed to drive home.              Please read over the following fact sheets you were given:  IF YOU HAVE QUESTIONS ABOUT YOUR PRE-OP INSTRUCTIONS PLEASE CALL [facility] - Preparing for Surgery Before surgery, you can play an important role.  Because skin is not sterile, your skin needs to be as free of germs as possible.  You can reduce the number of germs on your skin by washing with CHG (chlorahexidine gluconate) soap before surgery.  CHG is an antiseptic cleaner which kills germs and bonds with the skin to continue killing germs even after washing. Please DO NOT use if you have an allergy to CHG or antibacterial soaps.  If your skin becomes reddened/irritated stop using the CHG and inform your nurse when you arrive at Short Stay. Do not shave (including legs and underarms) for at least 48 hours prior to the first CHG shower.  You may shave your face/neck.  Please follow these instructions carefully:  1.  Shower with CHG Soap the night before surgery and the  morning of surgery.  2.  If you choose to wash your hair, wash your hair first as usual with your normal  shampoo.  3.  After you shampoo, rinse your hair and body thoroughly to remove the shampoo.                             4.  Use CHG as you would any other liquid soap.  You can apply chg directly to the skin and wash.  Gently with a scrungie or clean washcloth.  5.  Apply the CHG Soap to your body ONLY FROM THE NECK DOWN.   Do   not use on face/ open                           Wound or open sores. Avoid contact with eyes, ears mouth and   genitals (private parts).                       Wash face,  Genitals (private parts) with your normal soap.             6.  Wash thoroughly, paying special attention to the area where your    surgery  will be performed.  7.  Thoroughly rinse your body with warm water from the neck down.  8.  DO NOT shower/wash with your normal soap after using and rinsing off the CHG Soap.                9.  Pat yourself dry with a clean towel.            10.  Wear clean pajamas.             11.  Place clean sheets on your bed the night of your first shower and do not  sleep with pets. Day of Surgery : Do not apply any lotions/deodorants the morning of surgery.  Please wear clean clothes to the hospital/surgery center.  FAILURE TO FOLLOW THESE INSTRUCTIONS MAY RESULT IN THE CANCELLATION OF YOUR SURGERY  PATIENT SIGNATURE_________________________________  NURSE SIGNATURE__________________________________  ________________________________________________________________________  

## 2021-07-23 NOTE — Telephone Encounter (Signed)
   Name: [name]  DOB: [birth year]  MRN: [id number]   Primary Cardiologist: [name] Axe, MD  Chart reviewed as part of pre-operative protocol coverage. Patient was contacted 07/23/2021 in reference to pre-operative risk assessment for pending surgery as outlined below.  [name] was last seen on 05/20/21 by Dr. Caryl Short.  Since that day, MD [name] has done well.  He is still on light duty from back surgery, but can complete 4.0 METS without angina.   Therefore, based on ACC/AHA guidelines, the patient would be at acceptable risk for the planned procedure without further cardiovascular testing.   The patient was advised that if he develops new symptoms prior to surgery to contact our office to arrange for a follow-up visit, and he verbalized understanding.  I will route this recommendation to the requesting party via Epic fax function and remove from pre-op pool. Please call with questions.  [name] Bottcher, PA 07/23/2021, 4:43 PM

## 2021-07-23 NOTE — Telephone Encounter (Signed)
Discussed lab results with the pt on 07/23/2021 at 1145

## 2021-07-23 NOTE — Telephone Encounter (Signed)
Discussed lab results with the pt on 07/23/2021 at 1145    I have recommended bisphosphonate's at this time . Cautioned against GERD Symptoms    He is pending cholecystectomy   We discussed rare side effect of atypical fractures and osteonecrosis. Pt encouraged to have an updated dental exam    Alendronate 70 mg weekly     Abby Nena Jordan, MD  [facility] Endocrinology  [facility] [address] [place], [place] [address] Phone: [phone] FAX: [phone]

## 2021-07-23 NOTE — Progress Notes (Signed)
[facility] DEVICE PROGRAMMING  Patient Information: Name:  [name]  DOB:  1976/07/02  MRN:  [id number]    Willeen Cass, RN  P Cv Div Heartcare Device Planned Procedure:  Laparoscopic cholecystectomy with intraoperative cholangiogram  Surgeon:  Dr. Brantley Stage  Date of Procedure:  08/01/21  Cautery will be used.  Position during surgery:  unknown   Please send documentation back to:  [facility] (Fax # [phone])   Willeen Cass, RN  03/04/2021 10:13 AM  Device Information:  Clinic EP Physician:  [name], MD   Device Type:  Defibrillator Manufacturer and Phone #:  Medtronic: [phone] Pacemaker Dependent?:  No. Date of Last Device Check:  06/17/21 (remote) 05/20/21 (in-clinic) Normal Device Function?:  Yes.    Electrophysiologist's Recommendations:  Have magnet available. Provide continuous ECG monitoring when magnet is used or reprogramming is to be performed.  Procedure may interfere with device function.  Magnet should be placed over device during procedure.  Per Device Clinic Standing Orders, York Ram, RN  7:52 AM 07/23/2021

## 2021-07-25 NOTE — Progress Notes (Signed)
Anesthesia Chart Review   Case: [id number] Date/Time: 08/01/21 1000   Procedure: LAPAROSCOPIC CHOLECYSTECTOMY WITH INTRAOPERATIVE CHOLANGIOGRAM   Anesthesia type: General   Pre-op diagnosis: GALLSTONE PANCREATITIS   Location: [facility] ROOM 04 / [facility]   Surgeons: Erroll Luna, MD       DISCUSSION:45 y.o. former smoker with h/o sleep apnea, AICD in place (device orders in 07/23/2021 progress note), gallstone pancreatitis scheduled for above procedure 08/01/2021 with Dr. Erroll Luna.   Per cardiology preoperative evaluation 07/23/2021, "Chart reviewed as part of pre-operative protocol coverage. Patient was contacted 07/23/2021 in reference to pre-operative risk assessment for pending surgery as outlined below.  [name] was last seen on 05/20/21 by Dr. Caryl Comes.  Since that day, [name] has done well.  He is still on light duty from back surgery, but can complete 4.0 METS without angina.    Therefore, based on ACC/AHA guidelines, the patient would be at acceptable risk for the planned procedure without further cardiovascular testing."  Date of Last Device Check:  06/17/21 (remote) 05/20/21 (in-clinic) Normal Device Function?:  Yes.    Anticipate pt can proceed with planned procedure barring acute status change.   VS: BP 131/77   Pulse (!) 59   Temp 36.7 C (Oral)   Resp 18   Ht 5' 9" (1.753 m)   Wt 90.8 kg   SpO2 100%   BMI 29.55 kg/m   PROVIDERS: [name], [name], MD is PCP   [name], MD is Cardiologist  LABS: Labs reviewed: Acceptable for surgery. (all labs ordered are listed, but only abnormal results are displayed)  Labs Reviewed  COMPREHENSIVE METABOLIC PANEL - Abnormal; Notable for the following components:      Result Value   BUN 5 (*)    Creatinine, Ser 0.58 (*)    All other components within normal limits  CBC WITH DIFFERENTIAL/PLATELET     IMAGES:   EKG: 05/20/2021 Rate 66 bpm    CV: Echo 06/06/2019   1. Globally the RV size is mildly dilated.  The RV apical segment appears  dilated and akinetic consistent with this patient's history of ARVC.   2. Akinetic right ventricular apex.   3. Left ventricular ejection fraction, by visual estimation, is 55 to  60%. The left ventricle has normal function. Normal left ventricular size.  There is no left ventricular hypertrophy.   4. Global right ventricle has low normal systolic function.The right  ventricular size is mildly enlarged. No increase in right ventricular wall  thickness.   5. Left atrial size was normal.   6. Right atrial size was normal.   7. The mitral valve is normal in structure. No evidence of mitral valve  regurgitation.   8. The tricuspid valve is grossly normal. Tricuspid valve regurgitation  is trivial.   9. The aortic valve is tricuspid Aortic valve regurgitation was not  visualized by color flow Doppler. Structurally normal aortic valve, with  no evidence of sclerosis or stenosis.  10. The pulmonic valve was grossly normal. Pulmonic valve regurgitation is  not visualized by color flow Doppler.  11. Normal pulmonary artery systolic pressure.  12. The tricuspid regurgitant velocity is 2.16 m/s, and with an assumed  right atrial pressure of 3 mmHg, the estimated right ventricular systolic  pressure is normal at 21.7 mmHg.  13. A pacer wire is visualized in the RA and RV.  14. The inferior vena cava is normal in size with greater than 50%  respiratory variability, suggesting right  atrial pressure of 3 mmHg. 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   Asthma when younger   Fatty liver    History of chicken pox    History of kidney stones    ICD (implantable cardiac defibrillator) in place    Osteopenia    Pancreatitis    Sleep apnea    uses cpap   Ventricular tachycardia    sotalol therapy;  catheter ablation at [facility] 10/10  and [facility] 2013    Past Surgical History:  Procedure Laterality Date   CARDIAC CATHETERIZATION  06/01/2006   CARDIAC DEFIBRILLATOR PLACEMENT  06/03/2006   [employer]   HAND SURGERY Left    ICD GENERATOR CHANGEOUT N/A 11/04/2019   Procedure: ICD [place];  Surgeon: Deboraha Sprang, MD;  Location: [place] [facility];  Service: Cardiovascular;  Laterality: N/A;   IMPLANTABLE CARDIOVERTER DEFIBRILLATOR GENERATOR CHANGE N/A 12/11/2011   Procedure: IMPLANTABLE CARDIOVERTER DEFIBRILLATOR GENERATOR CHANGE;  Surgeon: Deboraha Sprang, MD;  Location: [facility] C[facility];  Service: Cardiovascular;  Laterality: N/A;   KYPHOPLASTY N/A 05/14/2021   Procedure: Lumbar Five Vertebroplasty;  Surgeon: Judith Part, MD;  Location: [place];  Service: Neurosurgery;  Laterality: N/A;   LUMBAR LAMINECTOMY/ DECOMPRESSION WITH MET-RX N/A 05/14/2021   Procedure: Lumbar Four-Five Minimally Invasive Laminectomy;  Surgeon: Judith Part, MD;  Location: [place];  Service: Neurosurgery;  Laterality: N/A;  3C/RM 19   TONSILLECTOMY     vt ablation  10/10, 10/13   x 2   WISDOM TOOTH EXTRACTION      MEDICATIONS:  alendronate (FOSAMAX) 70 MG tablet   Ascorbic Acid (VITAMIN C) 1000 MG tablet   Calcium-Phosphorus-Vitamin D (CITRACAL +D3 PO)   cetirizine (ZYRTEC) 10 MG tablet   Cholecalciferol (VITAMIN D3) 50 MCG (2000 UT) TABS   KLOR-CON M10 10 MEQ tablet   magnesium oxide (MAG-OX) 400 MG tablet   montelukast (SINGULAIR) 10 MG tablet   Multiple Vitamin (MULITIVITAMIN WITH MINERALS) TABS   oxyCODONE-acetaminophen (PERCOCET/ROXICET) 5-325 MG tablet   SOTALOL AF 120 MG TABS   tadalafil (CIALIS) 5 MG tablet   vitamin E 180 MG (400 UNITS) capsule   Zinc 30 MG CAPS   No current facility-administered medications for this encounter.    [name] Felix Ward, PA-C [facility] [phone]

## 2021-07-25 NOTE — Anesthesia Preprocedure Evaluation (Addendum)
Anesthesia Evaluation  Patient identified by MRN, date of birth, ID band Patient awake    Reviewed: Allergy & Precautions, NPO status , Patient's Chart, lab work & pertinent test results  Airway Mallampati: II  TM Distance: >3 FB Neck ROM: Full    Dental  (+) Dental Advisory Given   Pulmonary asthma , sleep apnea and Continuous Positive Airway Pressure Ventilation , former smoker,    breath sounds clear to auscultation       Cardiovascular + dysrhythmias + Cardiac Defibrillator  Rhythm:Regular Rate:Normal  AICD check 04/2021 99.7% VS, 0.3% VP. Pt denies defib since second ablation in 05/2012    Neuro/Psych  Neuromuscular disease    GI/Hepatic Neg liver ROS, GERD  ,  Endo/Other  negative endocrine ROS  Renal/GU negative Renal ROS     Musculoskeletal   Abdominal   Peds  Hematology negative hematology ROS (+)   Anesthesia Other Findings   Reproductive/Obstetrics                             Lab Results  Component Value Date   WBC 7.4 07/23/2021   HGB 13.6 07/23/2021   HCT 41.1 07/23/2021   MCV 91.9 07/23/2021   PLT 206 07/23/2021   Lab Results  Component Value Date   CREATININE 0.58 (L) 07/23/2021   BUN 5 (L) 07/23/2021   NA 139 07/23/2021   K 3.8 07/23/2021   CL 106 07/23/2021   CO2 28 07/23/2021                                    Anesthesia Physical Anesthesia Plan  ASA: 3  Anesthesia Plan: General   Post-op Pain Management: Tylenol PO (pre-op) and Minimal or no pain anticipated   Induction:   PONV Risk Score and Plan: 2 and Dexamethasone, Ondansetron, Treatment may vary due to age or medical condition and Midazolam  Airway Management Planned: Oral ETT  Additional Equipment: None  Intra-op Plan:   Post-operative Plan: Extubation in OR  Informed Consent: I have reviewed the patients History and Physical, chart, labs and discussed the procedure including the risks,  benefits and alternatives for the proposed anesthesia with the patient or authorized representative who has indicated his/her understanding and acceptance.     Dental advisory given  Plan Discussed with: CRNA  Anesthesia Plan Comments:        Anesthesia Quick Evaluation

## 2021-08-01 ENCOUNTER — Ambulatory Visit (HOSPITAL_COMMUNITY)
Admission: RE | Admit: 2021-08-01 | Discharge: 2021-08-01 | Disposition: A | Discharge status: Home / Self Care | Payer: BC Managed Care – PPO | Source: Ambulatory Visit | Attending: Surgery | Admitting: Surgery

## 2021-08-01 ENCOUNTER — Ambulatory Visit (HOSPITAL_COMMUNITY): Payer: BC Managed Care – PPO | Admitting: Anesthesiology

## 2021-08-01 ENCOUNTER — Ambulatory Visit (HOSPITAL_COMMUNITY): Payer: BC Managed Care – PPO

## 2021-08-01 ENCOUNTER — Encounter (HOSPITAL_COMMUNITY)
Admission: RE | Disposition: A | Discharge status: Home / Self Care | Payer: Self-pay | Source: Ambulatory Visit | Attending: Surgery

## 2021-08-01 ENCOUNTER — Ambulatory Visit (HOSPITAL_COMMUNITY): Payer: BC Managed Care – PPO | Admitting: Physician Assistant

## 2021-08-01 ENCOUNTER — Encounter (HOSPITAL_COMMUNITY): Payer: Self-pay | Admitting: Surgery

## 2021-08-01 DIAGNOSIS — G709 Myoneural disorder, unspecified: Secondary | ICD-10-CM | POA: Insufficient documentation

## 2021-08-01 DIAGNOSIS — K760 Fatty (change of) liver, not elsewhere classified: Secondary | ICD-10-CM | POA: Diagnosis not present

## 2021-08-01 DIAGNOSIS — J45909 Unspecified asthma, uncomplicated: Secondary | ICD-10-CM | POA: Diagnosis not present

## 2021-08-01 DIAGNOSIS — Z87891 Personal history of nicotine dependence: Secondary | ICD-10-CM | POA: Insufficient documentation

## 2021-08-01 DIAGNOSIS — K851 Biliary acute pancreatitis without necrosis or infection: Secondary | ICD-10-CM | POA: Diagnosis not present

## 2021-08-01 DIAGNOSIS — K8000 Calculus of gallbladder with acute cholecystitis without obstruction: Secondary | ICD-10-CM

## 2021-08-01 DIAGNOSIS — Z95810 Presence of automatic (implantable) cardiac defibrillator: Secondary | ICD-10-CM | POA: Insufficient documentation

## 2021-08-01 DIAGNOSIS — I499 Cardiac arrhythmia, unspecified: Secondary | ICD-10-CM | POA: Insufficient documentation

## 2021-08-01 DIAGNOSIS — K8020 Calculus of gallbladder without cholecystitis without obstruction: Secondary | ICD-10-CM

## 2021-08-01 DIAGNOSIS — K219 Gastro-esophageal reflux disease without esophagitis: Secondary | ICD-10-CM | POA: Insufficient documentation

## 2021-08-01 DIAGNOSIS — G4730 Sleep apnea, unspecified: Secondary | ICD-10-CM | POA: Insufficient documentation

## 2021-08-01 DIAGNOSIS — K8010 Calculus of gallbladder with chronic cholecystitis without obstruction: Secondary | ICD-10-CM | POA: Diagnosis not present

## 2021-08-01 HISTORY — PX: CHOLECYSTECTOMY: SHX55

## 2021-08-01 SURGERY — LAPAROSCOPIC CHOLECYSTECTOMY
Anesthesia: General | Site: Abdomen

## 2021-08-01 MED ORDER — CHLORHEXIDINE GLUCONATE CLOTH 2 % EX PADS: 6.0000 | MEDICATED_PAD | Freq: Once | CUTANEOUS | Status: DC

## 2021-08-01 MED ORDER — DEXMEDETOMIDINE (PRECEDEX) IN NS 20 MCG/5ML (4 MCG/ML) IV SYRINGE
PREFILLED_SYRINGE | INTRAVENOUS | Status: AC
  Filled 2021-08-01: qty 5

## 2021-08-01 MED ORDER — ORAL CARE MOUTH RINSE: 15.0000 mL | Freq: Once | OROMUCOSAL | Status: AC

## 2021-08-01 MED ORDER — INDOCYANINE GREEN 25 MG IV SOLR: 7.5000 mg | Freq: Once | INTRAVENOUS | Status: DC

## 2021-08-01 MED ORDER — LACTATED RINGERS IV SOLN: INTRAVENOUS | Status: DC

## 2021-08-01 MED ORDER — CHLORHEXIDINE GLUCONATE 0.12 % MT SOLN
15.0000 mL | Freq: Once | OROMUCOSAL | Status: AC
  Administered 2021-08-01: 15 mL via OROMUCOSAL

## 2021-08-01 MED ORDER — DEXAMETHASONE SODIUM PHOSPHATE 10 MG/ML IJ SOLN
INTRAMUSCULAR | Status: DC | PRN
  Administered 2021-08-01: 10 mg via INTRAVENOUS

## 2021-08-01 MED ORDER — ROCURONIUM BROMIDE 10 MG/ML (PF) SYRINGE
PREFILLED_SYRINGE | INTRAVENOUS | Status: DC | PRN
  Administered 2021-08-01 (×2): 10 mg via INTRAVENOUS
  Administered 2021-08-01: 50 mg via INTRAVENOUS

## 2021-08-01 MED ORDER — RINGERS IRRIGATION IR SOLN
Status: DC | PRN
  Administered 2021-08-01: 1

## 2021-08-01 MED ORDER — BUPIVACAINE-EPINEPHRINE (PF) 0.25% -1:200000 IJ SOLN
INTRAMUSCULAR | Status: AC
  Filled 2021-08-01: qty 30

## 2021-08-01 MED ORDER — PHENYLEPHRINE 40 MCG/ML (10ML) SYRINGE FOR IV PUSH (FOR BLOOD PRESSURE SUPPORT)
PREFILLED_SYRINGE | INTRAVENOUS | Status: AC
  Filled 2021-08-01: qty 10

## 2021-08-01 MED ORDER — IBUPROFEN 800 MG PO TABS: 800.0000 mg | ORAL_TABLET | Freq: Three times a day (TID) | ORAL | 0 refills | Status: DC | PRN

## 2021-08-01 MED ORDER — MIDAZOLAM HCL 2 MG/2ML IJ SOLN
INTRAMUSCULAR | Status: AC
  Filled 2021-08-01: qty 2

## 2021-08-01 MED ORDER — OXYCODONE HCL 5 MG PO TABS: 5.0000 mg | ORAL_TABLET | Freq: Four times a day (QID) | ORAL | 0 refills | Status: DC | PRN

## 2021-08-01 MED ORDER — SODIUM CHLORIDE (PF) 0.9 % IJ SOLN
INTRAMUSCULAR | Status: DC | PRN
  Administered 2021-08-01: 10 mL

## 2021-08-01 MED ORDER — PROPOFOL 10 MG/ML IV BOLUS
INTRAVENOUS | Status: DC | PRN
  Administered 2021-08-01: 200 mg via INTRAVENOUS

## 2021-08-01 MED ORDER — FENTANYL CITRATE PF 50 MCG/ML IJ SOSY
PREFILLED_SYRINGE | INTRAMUSCULAR | Status: AC
  Filled 2021-08-01: qty 1

## 2021-08-01 MED ORDER — MIDAZOLAM HCL 5 MG/5ML IJ SOLN
INTRAMUSCULAR | Status: DC | PRN
  Administered 2021-08-01: 2 mg via INTRAVENOUS

## 2021-08-01 MED ORDER — FENTANYL CITRATE PF 50 MCG/ML IJ SOSY
25.0000 ug | PREFILLED_SYRINGE | INTRAMUSCULAR | Status: DC | PRN
  Administered 2021-08-01 (×2): 50 ug via INTRAVENOUS

## 2021-08-01 MED ORDER — SODIUM CHLORIDE 0.9 % IR SOLN
Status: DC | PRN
  Administered 2021-08-01: 500 mL

## 2021-08-01 MED ORDER — AMISULPRIDE (ANTIEMETIC) 5 MG/2ML IV SOLN: 10.0000 mg | Freq: Once | INTRAVENOUS | Status: DC | PRN

## 2021-08-01 MED ORDER — FENTANYL CITRATE (PF) 100 MCG/2ML IJ SOLN
INTRAMUSCULAR | Status: AC
  Filled 2021-08-01: qty 2

## 2021-08-01 MED ORDER — IOPAMIDOL (ISOVUE-300) INJECTION 61%: INTRAVENOUS | Status: DC | PRN

## 2021-08-01 MED ORDER — PROPOFOL 10 MG/ML IV BOLUS
INTRAVENOUS | Status: AC
  Filled 2021-08-01: qty 20

## 2021-08-01 MED ORDER — ONDANSETRON HCL 4 MG/2ML IJ SOLN
INTRAMUSCULAR | Status: DC | PRN
  Administered 2021-08-01: 4 mg via INTRAVENOUS

## 2021-08-01 MED ORDER — ACETAMINOPHEN 500 MG PO TABS
1000.0000 mg | ORAL_TABLET | ORAL | Status: AC
  Administered 2021-08-01: 1000 mg via ORAL
  Filled 2021-08-01: qty 2

## 2021-08-01 MED ORDER — CEFAZOLIN SODIUM-DEXTROSE 2-4 GM/100ML-% IV SOLN
2.0000 g | INTRAVENOUS | Status: AC
  Administered 2021-08-01: 2 g via INTRAVENOUS
  Filled 2021-08-01: qty 100

## 2021-08-01 MED ORDER — SUGAMMADEX SODIUM 200 MG/2ML IV SOLN
INTRAVENOUS | Status: DC | PRN
  Administered 2021-08-01: 200 mg via INTRAVENOUS

## 2021-08-01 MED ORDER — FENTANYL CITRATE (PF) 100 MCG/2ML IJ SOLN
INTRAMUSCULAR | Status: DC | PRN
  Administered 2021-08-01: 50 ug via INTRAVENOUS
  Administered 2021-08-01: 100 ug via INTRAVENOUS

## 2021-08-01 SURGICAL SUPPLY — 40 items
ADH SKN CLS APL DERMABOND .7 (GAUZE/BANDAGES/DRESSINGS) ×2
APPLIER CLIP ROT 10 11.4 M/L (STAPLE) ×2
APR CLP MED LRG 11.4X10 (STAPLE) ×1
BAG COUNTER SPONGE SURGICOUNT (BAG) IMPLANT
BAG SPEC RTRVL LRG 6X4 10 (ENDOMECHANICALS) ×1
BAG SPNG CNTER NS LX DISP (BAG)
CABLE HIGH FREQUENCY MONO STRZ (ELECTRODE) ×1 IMPLANT
CLIP APPLIE ROT 10 11.4 M/L (STAPLE) ×1 IMPLANT
COVER MAYO STAND STRL (DRAPES) ×2 IMPLANT
DECANTER SPIKE VIAL GLASS SM (MISCELLANEOUS) ×2 IMPLANT
DERMABOND ADVANCED (GAUZE/BANDAGES/DRESSINGS) ×2
DERMABOND ADVANCED .7 DNX12 (GAUZE/BANDAGES/DRESSINGS) IMPLANT
DRAPE C-ARM 42X120 X-RAY (DRAPES) ×1 IMPLANT
DRAPE UTILITY XL STRL (DRAPES) ×2 IMPLANT
DRAPE WARM FLUID 44X44 (DRAPES) ×2 IMPLANT
ELECT REM PT RETURN 15FT ADLT (MISCELLANEOUS) ×2 IMPLANT
GLOVE SURG MICRO LTX SZ7.5 (GLOVE) ×2 IMPLANT
GLOVE SURG UNDER LTX SZ8 (GLOVE) ×2 IMPLANT
GOWN STRL REUS W/TWL XL LVL3 (GOWN DISPOSABLE) ×4 IMPLANT
HEMOSTAT SURGICEL 4X8 (HEMOSTASIS) IMPLANT
IRRIG SUCT STRYKERFLOW 2 WTIP (MISCELLANEOUS) ×2
IRRIGATION SUCT STRKRFLW 2 WTP (MISCELLANEOUS) ×1 IMPLANT
KIT BASIN OR (CUSTOM PROCEDURE TRAY) ×2 IMPLANT
KIT TURNOVER KIT A (KITS) IMPLANT
PAD POSITIONING PINK XL (MISCELLANEOUS) IMPLANT
PENCIL SMOKE EVACUATOR (MISCELLANEOUS) IMPLANT
POUCH SPECIMEN RETRIEVAL 10MM (ENDOMECHANICALS) ×2 IMPLANT
PROTECTOR NERVE ULNAR (MISCELLANEOUS) IMPLANT
SCISSORS LAP 5X35 DISP (ENDOMECHANICALS) ×2 IMPLANT
SET CHOLANGIOGRAPH MIX (MISCELLANEOUS) ×2 IMPLANT
SET TUBE SMOKE EVAC HIGH FLOW (TUBING) IMPLANT
SLEEVE XCEL OPT CAN 5 100 (ENDOMECHANICALS) ×2 IMPLANT
SUT MNCRL AB 4-0 PS2 18 (SUTURE) ×2 IMPLANT
TAPE CLOTH 4X10 WHT NS (GAUZE/BANDAGES/DRESSINGS) IMPLANT
TOWEL OR 17X26 10 PK STRL BLUE (TOWEL DISPOSABLE) ×2 IMPLANT
TOWEL OR NON WOVEN STRL DISP B (DISPOSABLE) ×2 IMPLANT
TRAY LAPAROSCOPIC (CUSTOM PROCEDURE TRAY) ×2 IMPLANT
TROCAR BLADELESS OPT 5 100 (ENDOMECHANICALS) ×2 IMPLANT
TROCAR XCEL BLUNT TIP 100MML (ENDOMECHANICALS) ×2 IMPLANT
TROCAR XCEL NON-BLD 11X100MML (ENDOMECHANICALS) IMPLANT

## 2021-08-01 NOTE — Op Note (Signed)
Laparoscopic Cholecystectomy with IOC Procedure Note  Indications: This patient presents with symptomatic gallbladder disease and will undergo laparoscopic cholecystectomy.The procedure has been discussed with the patient. Operative and non operative treatments have been discussed. Risks of surgery include bleeding, infection,  Common bile duct injury,  Injury to the stomach,liver, colon,small intestine, abdominal wall,  Diaphragm,  Major blood vessels,  And the need for an open procedure.  Other risks include worsening of medical problems, death,  DVT and pulmonary embolism, and cardiovascular events.   Medical options have also been discussed. The patient has been informed of long term expectations of surgery and non surgical options,  The patient agrees to proceed.     Pre-operative Diagnosis: gallstone pancreatitis   Post-operative Diagnosis: same   Surgeon: Turner Daniels  MD   Assistants: OR staff  Anesthesia: General endotracheal anesthesia  ASA Class: 2  Procedure Details  The patient was seen again in the Holding Room. The risks, benefits, complications, treatment options, and expected outcomes were discussed with the patient. The possibilities of reaction to medication, pulmonary aspiration, perforation of viscus, bleeding, recurrent infection, finding a normal gallbladder, the need for additional procedures, failure to diagnose a condition, the possible need to convert to an open procedure, and creating a complication requiring transfusion or operation were discussed with the patient. The patient and/or family concurred with the proposed plan, giving informed consent. The site of surgery properly noted/marked. The patient was taken to Operating Room, identified as [name] and the procedure verified as Laparoscopic Cholecystectomy with Intraoperative Cholangiograms. A Time Out was held and the above information confirmed.  Prior to the induction of general anesthesia, antibiotic  prophylaxis was administered. General endotracheal anesthesia was then administered and tolerated well. After the induction, the abdomen was prepped in the usual sterile fashion. The patient was positioned in the supine position with the left arm comfortably tucked, along with some reverse Trendelenburg.  An incision was  made at the umbilicus . The midline fascia was incised and the Hasson technique was used to introduce a 12 mm port under direct vision. It was secured with a figure of eight Vicryl suture placed in the usual fashion. Pneumoperitoneum was then created with CO2 and tolerated well without any adverse changes in the patient's vital signs. Additional trocars were introduced under direct vision with an 11 mm trocar in the epigastrium and two  5 mm trocars in the right upper quadrant. All skin incisions were infiltrated with a local anesthetic agent before making the incision and placing the trocars.   The gallbladder was identified, the fundus grasped and retracted cephalad. Adhesions were lysed bluntly and with the electrocautery where indicated, taking care not to injure any adjacent organs or viscus. The infundibulum was grasped and retracted laterally, exposing the peritoneum overlying the triangle of Calot. This was then divided and exposed in a blunt fashion. The cystic duct was clearly identified and bluntly dissected circumferentially. The junctions of the gallbladder, cystic duct and common bile duct were clearly identified prior to the division of any linear structure.   An incision was made in the cystic duct and the cholangiogram catheter introduced. The catheter was secured using an endoclip. The study showed no stones and good visualization of the distal and proximal biliary tree. The catheter was then removed.   The cystic duct was then  ligated with surgical clips  on the patient side and  clipped on the gallbladder side and divided. The cystic artery was identified, dissected  free, ligated with clips and divided as well. Posterior cystic artery clipped and divided.  The gallbladder was dissected from the liver bed in retrograde fashion with the electrocautery. The gallbladder was removed. The liver bed was irrigated and inspected. Hemostasis was achieved with the electrocautery. Copious irrigation was utilized and was repeatedly aspirated until clear all particulate matter. Hemostasis was achieved with no signs  Of bleeding or bile leakage.  Pneumoperitoneum was completely reduced after viewing removal of the trocars under direct vision. The wound was thoroughly irrigated and the fascia was then closed with a figure of eight suture; the skin was then closed with 4 0 monocryl  and a sterile dressing of dermabond  was applied.  Instrument, sponge, and needle counts were correct at closure and at the conclusion of the case.   Findings: Cholelithiasis  Estimated Blood Loss: less than 50 mL         Drains: none         Total IV Fluids:per record         Specimens: Gallbladder           Complications: None; patient tolerated the procedure well.         Disposition: PACU - hemodynamically stable.         Condition: stable

## 2021-08-01 NOTE — Discharge Instructions (Signed)
[facility] ______[facility] LAPAROSCOPIC SURGERY: POST OP INSTRUCTIONS Always review your discharge instruction sheet given to you by the facility where your surgery was performed. IF YOU HAVE DISABILITY OR FAMILY LEAVE FORMS, YOU MUST BRING THEM TO THE OFFICE FOR PROCESSING.   DO NOT GIVE THEM TO YOUR DOCTOR.  A prescription for pain medication may be given to you upon discharge.  Take your pain medication as prescribed, if needed.  If narcotic pain medicine is not needed, then you may take acetaminophen (Tylenol) or ibuprofen (Advil) as needed. Take your usually prescribed medications unless otherwise directed. If you need a refill on your pain medication, please contact your pharmacy.  They will contact our office to request authorization. Prescriptions will not be filled after 5pm or on week-ends. You should follow a light diet the first few days after arrival home, such as soup and crackers, etc.  Be sure to include lots of fluids daily. Most patients will experience some swelling and bruising in the area of the incisions.  Ice packs will help.  Swelling and bruising can take several days to resolve.  It is common to experience some constipation if taking pain medication after surgery.  Increasing fluid intake and taking a stool softener (such as Colace) will usually help or prevent this problem from occurring.  A mild laxative (Milk of Magnesia or Miralax) should be taken according to package instructions if there are no bowel movements after 48 hours. Unless discharge instructions indicate otherwise, you may remove your bandages 24-48 hours after surgery, and you may shower at that time.  You may have steri-strips (small skin tapes) in place directly over the incision.  These strips should be left on the skin for 7-10 days.  If your surgeon used skin glue on the incision, you may shower in 24 hours.  The glue will flake off over the next 2-3 weeks.  Any sutures or staples will be  removed at the office during your follow-up visit. ACTIVITIES:  You may resume regular (light) daily activities beginning the next day--such as daily self-care, walking, climbing stairs--gradually increasing activities as tolerated.  You may have sexual intercourse when it is comfortable.  Refrain from any heavy lifting or straining until approved by your doctor. You may drive when you are no longer taking prescription pain medication, you can comfortably wear a seatbelt, and you can safely maneuver your car and apply brakes. RETURN TO WORK:  __________________________________________________________ You should see your doctor in the office for a follow-up appointment approximately 2-3 weeks after your surgery.  Make sure that you call for this appointment within a day or two after you arrive home to insure a convenient appointment time. OTHER INSTRUCTIONS: __________________________________________________________________________________________________________________________ __________________________________________________________________________________________________________________________ WHEN TO CALL YOUR DOCTOR: Fever over 101.0 Inability to urinate Continued bleeding from incision. Increased pain, redness, or drainage from the incision. Increasing abdominal pain  The clinic staff is available to answer your questions during regular business hours.  Please don't hesitate to call and ask to speak to one of the nurses for clinical concerns.  If you have a medical emergency, go to the nearest emergency room or call 911.  A surgeon from [facility] is always on call at the hospital. [address], [address], [place], [place]  [address] ? [address] [address], [place], [place]   [address] [phone] ? [phone] ? FAX [phone] Web site: [email or web address]  

## 2021-08-01 NOTE — H&P (Signed)
History of Present Illness: [name] is a 45 y.o. male who is seen today as an office consultation at the request of Dr. [name] for evaluation of Abdominal Pain .   Patient describes history of abdominal pain. His last episode was this summer or he is diagnosed with pancreatitis. He has a longstanding history of gallstones but has not had treatment for it. This was diagnosed back in 2010. He also has a history of intermittent elevation of his liver function studies. He has not been referred to a surgeon for this in the past. He was discharged home from the [facility] with the diagnosis of pancreatitis but this was not felt to be from his gallstones. He has had intermittent abdominal pain in his epigastrium in the past but nothing severe. He denies jaundice. He does have multiple other medical problems going on as well including cardiac issues. His pain was in his epigastrium when it occurred and with radiation to his back with nausea and vomiting. This is in June of this year. He was seen by gastroenterologist and referred for discussion of cholecystectomy given the findings of pancreatitis as well as intermittent history of elevated liver function studies.  Review of Systems: A complete review of systems was obtained from the patient. I have reviewed this information and discussed as appropriate with the patient. See HPI as well for other ROS.    Medical History: Past Medical History:  Diagnosis Date   Amputation, finger, traumatic  Left Forefinger   Anxiety 10/27/2011  Related to his heart disorder   Arrhythmia   Arrhythmogenic right ventricular tachycardia 10/24/2011   Environmental allergies 10/27/2011   High risk medications (Sotalol) long-term use 10/27/2011   Sleep apnea   Patient Active Problem List  Diagnosis   Arrhythmogenic right ventricular tachycardia   ICD (implantable cardiac defibrillator) in place   Anxiety   Environmental allergies   High risk  medications (Sotalol) long-term use   Past Surgical History:  Procedure Laterality Date   CARDIAC CATHETERIZATION   INSERT / REPLACE / REMOVE PACEMAKER   RADIOFREQUENCY ABLATION 2010  Endocardial ABlation for VT   TONSILLECTOMY    Allergies  Allergen Reactions   Others Anaphylaxis  Bee/Wasp Stings. Bee/Wasp Stings   Venom-Honey Bee Anaphylaxis   Lidocaine Other (See Comments)  [name] [name]   Current Outpatient Medications on File Prior to Visit  Medication Sig Dispense Refill   cetirizine (ZYRTEC) 5 MG tablet Take 5 mg by mouth daily.   fluticasone (FLONASE) 50 mcg/actuation nasal spray Place 2 sprays into both nostrils daily as needed.   magnesium oxide 400 mg Cap Take 1 capsule by mouth 2 (two) times daily.   montelukast (SINGULAIR) 10 mg tablet Take 10 mg by mouth nightly.   multivitamin capsule Take 1 capsule by mouth daily.   potassium chloride (K-DUR,KLOR-CON) 20 MEQ CR tablet Take 40 mEq by mouth 2 (two) times daily.   sotalol (BETAPACE) 240 MG tablet Take 240 mg by mouth 2 (two) times daily.   UNABLE TO FIND Sleep study DX: 780.57 1 Units 1   No current facility-administered medications on file prior to visit.   Family History  Problem Relation Age of Onset   Sudden cardiac death Father   Supraventricular tachycardia Paternal Grandmother   High blood pressure (Hypertension) Paternal Grandmother   Supraventricular tachycardia Sister   High blood pressure (Hypertension) Maternal Uncle   Hyperlipidemia (Elevated cholesterol) Maternal Uncle   Myocardial Infarction (Heart attack) Maternal  Uncle   Sudden cardiac death Paternal Uncle    Social History   Tobacco Use  Smoking Status Former   Types: Cigarettes   Quit date: 06/28/2008   Years since quitting: 13.0  Smokeless Tobacco Not on file    Social History   Socioeconomic History   Marital status: Single  Tobacco Use   Smoking status: Former  Types: Cigarettes  Quit date: 06/28/2008   Years since quitting: 13.0  Substance and Sexual Activity   Alcohol use: Yes  Comment: Rare   Drug use: No   Sexual activity: Yes   Objective:   Vitals:  07/22/21 1122  BP: 126/80  Pulse: 62  Temp: 36.7 C (98.1 F)  SpO2: 99%  Weight: 92.2 kg (203 lb 3.2 oz)  Height: 175.3 cm (5\' 9" )   Body mass index is 30.01 kg/m.  Physical Exam Constitutional:  Appearance: Normal appearance.  HENT:  Head: Normocephalic.  Eyes:  Pupils: Pupils are equal, round, and reactive to light.  Cardiovascular:  Rate and Rhythm: Normal rate and regular rhythm.  Pulmonary:  Effort: Pulmonary effort is normal.  Breath sounds: Normal breath sounds.  Chest:   Abdominal:  General: Abdomen is flat.  Palpations: Abdomen is soft. There is no mass.  Tenderness: There is no abdominal tenderness.  Hernia: No hernia is present.  Musculoskeletal:  General: Normal range of motion.  Skin: General: Skin is warm and dry.  Neurological:  General: No focal deficit present.  Mental Status: He is alert.  Psychiatric:  Mood and Affect: Mood normal.  Behavior: Behavior normal.     Labs, Imaging and Diagnostic Testing:  Latest Reference Range & Units 06/28/21 08:53  Sodium 135 - 145 mEq/L 142  Potassium 3.5 - 5.1 mEq/L 4.2  Chloride 96 - 112 mEq/L 104  CO2 19 - 32 mEq/L 31  Glucose 70 - 99 mg/dL 98  BUN 6 - 23 mg/dL 7  Creatinine 0.40 - 1.50 mg/dL 0.72  Calcium 8.4 - 10.5 mg/dL 9.8  Phosphorus 2.3 - 4.6 mg/dL 3.3  Magnesium 1.5 - 2.5 mg/dL 2.0  Alkaline Phosphatase 39 - 117 U/L 48  Albumin 3.5 - 5.2 g/dL 4.6  AST 0 - 37 U/L 29  ALT 0 - 53 U/L 53  Total Protein 6.0 - 8.3 g/dL 7.4  Antigliadin Abs, IgA 0 - 19 units 5  Total Bilirubin 0.2 - 1.2 mg/dL 0.5  GFR >60.00 mL/min 112.09   Elevated LFT.     COMPLETE ABDOMINAL ULTRASOUND     Comparison:  None.     Findings:     Gallbladder:  There are multiple gallstones the largest measuring  2.4 cm.  Gallbladder wall is not thickened.      Common bile duct:  2.2 mm.     Liver:  Fatty infiltration of the liver.  Liver is enlarged  measuring 16 cm in length.     IVC:  Negative.     Pancreas:  Pancreatic head and tail are not well visualized.  No  abnormality the body the pancreas.     Spleen:  Negative.     Right Kidney:  Negative.     Left Kidney:  Negative.     Abdominal aorta:  Negative.     Other Findings:       IMPRESSION:  Multiple gallstones without biliary obstruction.     Hepatomegaly with fatty infiltration of the liver.   Provider: Micheline Rough  Assessment and Plan:  Diagnoses and all orders for this visit:  Gallstone pancreatitis    Recommend laparoscopic cholecystectomy cholangiogram. Discussed the natural progression of gallstone pancreatitis. Discussed risks and benefits of surgery and potential complications of bleeding, infection, common bile duct injury, organ injury, bile leak, the need further treatments, the need for common duct stone extraction with ERCP, open surgery, worsening of his underlying medical problems and the natural progression history of gallstone pancreatitis. This more likely represents what is happened in his history after review of the chart but also explained other possibilities as well for his pancreatitis. He is a nondrinker. He would like to proceed with cholecystectomy soon as possible.  Total time 46 minutes reviewing chart, face-to-face time, examination, discussion of surgical options, discussing natural history of gallstone pancreatitis, discussing other options of treatment and discussing surgery with complications.

## 2021-08-01 NOTE — Anesthesia Procedure Notes (Signed)
Procedure Name: Intubation Date/Time: 08/01/2021 11:40 AM Performed by: Gerald Leitz, CRNA Pre-anesthesia Checklist: Patient identified, Patient being monitored, Timeout performed, Emergency Drugs available and Suction available Patient Re-evaluated:Patient Re-evaluated prior to induction Oxygen Delivery Method: Circle system utilized Preoxygenation: Pre-oxygenation with 100% oxygen Induction Type: IV induction Ventilation: Mask ventilation without difficulty Laryngoscope Size: 4, Glidescope and Mac Grade View: Grade I Tube type: Oral Tube size: 7.5 mm Number of attempts: 2 Airway Equipment and Method: Stylet and Video-laryngoscopy Placement Confirmation: ETT inserted through vocal cords under direct vision, positive ETCO2 and breath sounds checked- equal and bilateral Secured at: 22 cm Tube secured with: Tape Dental Injury: Teeth and Oropharynx as per pre-operative assessment  Difficulty Due To: Difficulty was anticipated, Difficult Airway- due to anterior larynx and Difficult Airway- due to immobile epiglottis

## 2021-08-01 NOTE — Interval H&P Note (Signed)
History and Physical Interval Note:  08/01/2021 10:20 AM  [name]  has presented today for surgery, with the diagnosis of GALLSTONE PANCREATITIS.  The various methods of treatment have been discussed with the patient and family. After consideration of risks, benefits and other options for treatment, the patient has consented to  Procedure(s): LAPAROSCOPIC CHOLECYSTECTOMY WITH INTRAOPERATIVE CHOLANGIOGRAM (N/A) as a surgical intervention.  The patient's history has been reviewed, patient examined, no change in status, stable for surgery.  I have reviewed the patient's chart and labs.  Questions were answered to the patient's satisfaction.    The procedure has been discussed with the patient. Operative and non operative treatments have been discussed. Risks of surgery include bleeding, infection,  Common bile duct injury,  Injury to the stomach,liver, colon,small intestine, abdominal wall,  Diaphragm,  Major blood vessels,  And the need for an open procedure.  Other risks include worsening of medical problems, death,  DVT and pulmonary embolism, and cardiovascular events.   Medical options have also been discussed. The patient has been informed of long term expectations of surgery and non surgical options,  The patient agrees to proceed.    [name]

## 2021-08-01 NOTE — Transfer of Care (Signed)
Immediate Anesthesia Transfer of Care Note  Patient: [name]  Procedure(s) Performed: Procedure(s): LAPAROSCOPIC CHOLECYSTECTOMY WITH INTRAOPERATIVE CHOLANGIOGRAM (N/A)  Patient Location: PACU  Anesthesia Type:General  Level of Consciousness: Alert, Awake, Oriented  Airway & Oxygen Therapy: Patient Spontanous Breathing  Post-op Assessment: Report given to RN  Post vital signs: Reviewed and stable  Last Vitals:  Vitals:   08/01/21 0919  BP: 122/82  Pulse: 62  Resp: 20  Temp: 36.7 C  SpO2: 174%    Complications: No apparent anesthesia complications

## 2021-08-02 ENCOUNTER — Encounter (HOSPITAL_COMMUNITY): Payer: Self-pay | Admitting: Surgery

## 2021-08-02 LAB — SURGICAL PATHOLOGY

## 2021-08-02 NOTE — Anesthesia Postprocedure Evaluation (Signed)
Anesthesia Post Note  Patient: [name]  Procedure(s) Performed: LAPAROSCOPIC CHOLECYSTECTOMY WITH INTRAOPERATIVE CHOLANGIOGRAM (Abdomen)     Patient location during evaluation: PACU Anesthesia Type: General Level of consciousness: awake and alert Pain management: pain level controlled Vital Signs Assessment: post-procedure vital signs reviewed and stable Respiratory status: spontaneous breathing, nonlabored ventilation, respiratory function stable and patient connected to nasal cannula oxygen Cardiovascular status: blood pressure returned to baseline and stable Postop Assessment: no apparent nausea or vomiting Anesthetic complications: no   No notable events documented.  Last Vitals:  Vitals:   08/01/21 1426 08/01/21 1451  BP: 119/70 115/83  Pulse: (!) 58 66  Resp: 16 16  Temp:    SpO2: 96% 99%    Last Pain:  Vitals:   08/01/21 1451  TempSrc:   PainSc: 3                  Tiajuana Amass

## 2021-08-05 ENCOUNTER — Ambulatory Visit (INDEPENDENT_AMBULATORY_CARE_PROVIDER_SITE_OTHER): Payer: BC Managed Care – PPO

## 2021-08-05 DIAGNOSIS — I428 Other cardiomyopathies: Secondary | ICD-10-CM | POA: Diagnosis not present

## 2021-08-05 LAB — CUP PACEART REMOTE DEVICE CHECK
Battery Remaining Longevity: 127 mo
Battery Voltage: 3.03 V
Brady Statistic RV Percent Paced: 0.24 %
Date Time Interrogation Session: 20221219033625
HighPow Impedance: 50 Ohm
HighPow Impedance: 71 Ohm
Implantable Lead Implant Date: 20071018
Implantable Lead Location: 753860
Implantable Lead Model: 6947
Implantable Pulse Generator Implant Date: 20210319
Lead Channel Impedance Value: 342 Ohm
Lead Channel Impedance Value: 418 Ohm
Lead Channel Pacing Threshold Amplitude: 0.75 V
Lead Channel Pacing Threshold Pulse Width: 0.4 ms
Lead Channel Sensing Intrinsic Amplitude: 10.5 mV
Lead Channel Sensing Intrinsic Amplitude: 10.5 mV
Lead Channel Setting Pacing Amplitude: 2 V
Lead Channel Setting Pacing Pulse Width: 0.4 ms
Lead Channel Setting Sensing Sensitivity: 1.2 mV

## 2021-08-15 NOTE — Progress Notes (Signed)
Remote ICD transmission.   

## 2021-08-18 ENCOUNTER — Other Ambulatory Visit: Payer: Self-pay | Admitting: Internal Medicine

## 2021-09-01 ENCOUNTER — Other Ambulatory Visit: Payer: Self-pay | Admitting: Family Medicine

## 2021-09-12 ENCOUNTER — Other Ambulatory Visit: Payer: BC Managed Care – PPO

## 2021-09-16 ENCOUNTER — Telehealth: Payer: Self-pay | Admitting: Family Medicine

## 2021-09-16 DIAGNOSIS — Z0000 Encounter for general adult medical examination without abnormal findings: Secondary | ICD-10-CM

## 2021-09-16 DIAGNOSIS — E781 Pure hyperglyceridemia: Secondary | ICD-10-CM

## 2021-09-16 DIAGNOSIS — R7309 Other abnormal glucose: Secondary | ICD-10-CM

## 2021-09-16 DIAGNOSIS — N401 Enlarged prostate with lower urinary tract symptoms: Secondary | ICD-10-CM

## 2021-09-16 DIAGNOSIS — E559 Vitamin D deficiency, unspecified: Secondary | ICD-10-CM

## 2021-09-16 NOTE — Telephone Encounter (Signed)
-----   Message from Ellamae Sia sent at 08/28/2021  7:45 AM EST ----- Regarding: Lab orders for Tuesday, 1.31.23 Patient is scheduled for CPX labs, please order future labs, Thanks , Karna Christmas

## 2021-09-17 ENCOUNTER — Other Ambulatory Visit: Payer: Self-pay

## 2021-09-17 ENCOUNTER — Other Ambulatory Visit (INDEPENDENT_AMBULATORY_CARE_PROVIDER_SITE_OTHER): Payer: BC Managed Care – PPO

## 2021-09-17 DIAGNOSIS — N401 Enlarged prostate with lower urinary tract symptoms: Secondary | ICD-10-CM | POA: Diagnosis not present

## 2021-09-17 DIAGNOSIS — R7309 Other abnormal glucose: Secondary | ICD-10-CM

## 2021-09-17 DIAGNOSIS — E781 Pure hyperglyceridemia: Secondary | ICD-10-CM | POA: Diagnosis not present

## 2021-09-17 DIAGNOSIS — E559 Vitamin D deficiency, unspecified: Secondary | ICD-10-CM

## 2021-09-17 DIAGNOSIS — R351 Nocturia: Secondary | ICD-10-CM

## 2021-09-17 DIAGNOSIS — Z0000 Encounter for general adult medical examination without abnormal findings: Secondary | ICD-10-CM

## 2021-09-17 LAB — COMPREHENSIVE METABOLIC PANEL
ALT: 25 U/L (ref 0–53)
AST: 21 U/L (ref 0–37)
Albumin: 4.5 g/dL (ref 3.5–5.2)
Alkaline Phosphatase: 39 U/L (ref 39–117)
BUN: 8 mg/dL (ref 6–23)
CO2: 33 mEq/L — ABNORMAL HIGH (ref 19–32)
Calcium: 9.2 mg/dL (ref 8.4–10.5)
Chloride: 103 mEq/L (ref 96–112)
Creatinine, Ser: 0.74 mg/dL (ref 0.40–1.50)
GFR: 111 mL/min (ref >60.00)
Glucose, Bld: 93 mg/dL (ref 70–99)
Potassium: 4.2 mEq/L (ref 3.5–5.1)
Sodium: 140 mEq/L (ref 135–145)
Total Bilirubin: 0.5 mg/dL (ref 0.2–1.2)
Total Protein: 7 g/dL (ref 6.0–8.3)

## 2021-09-17 LAB — CBC WITH DIFFERENTIAL/PLATELET
Basophils Absolute: 0 10*3/uL (ref 0.0–0.1)
Basophils Relative: 0.6 % (ref 0.0–3.0)
Eosinophils Absolute: 0.1 10*3/uL (ref 0.0–0.7)
Eosinophils Relative: 2.2 % (ref 0.0–5.0)
HCT: 42.1 % (ref 39.0–52.0)
Hemoglobin: 14 g/dL (ref 13.0–17.0)
Lymphocytes Relative: 35.7 % (ref 12.0–46.0)
Lymphs Abs: 2.5 10*3/uL (ref 0.7–4.0)
MCHC: 33.4 g/dL (ref 30.0–36.0)
MCV: 88.3 fl (ref 78.0–100.0)
Monocytes Absolute: 0.7 10*3/uL (ref 0.1–1.0)
Monocytes Relative: 10.7 % (ref 3.0–12.0)
Neutro Abs: 3.5 10*3/uL (ref 1.4–7.7)
Neutrophils Relative %: 50.8 % (ref 43.0–77.0)
Platelets: 197 10*3/uL (ref 150.0–400.0)
RBC: 4.76 Mil/uL (ref 4.22–5.81)
RDW: 12.9 % (ref 11.5–15.5)
WBC: 6.9 10*3/uL (ref 4.0–10.5)

## 2021-09-17 LAB — TSH: TSH: 0.97 u[IU]/mL (ref 0.35–5.50)

## 2021-09-17 LAB — LDL CHOLESTEROL, DIRECT: Direct LDL: 132 mg/dL

## 2021-09-17 LAB — LIPID PANEL
Cholesterol: 196 mg/dL (ref 0–200)
HDL: 36.4 mg/dL — ABNORMAL LOW (ref >39.00)
NonHDL: 159.9
Total CHOL/HDL Ratio: 5
Triglycerides: 288 mg/dL — ABNORMAL HIGH (ref 0.0–149.0)
VLDL: 57.6 mg/dL — ABNORMAL HIGH (ref 0.0–40.0)

## 2021-09-17 LAB — PSA: PSA: 0.8 ng/mL (ref 0.10–4.00)

## 2021-09-17 LAB — VITAMIN D 25 HYDROXY (VIT D DEFICIENCY, FRACTURES): VITD: 45.71 ng/mL (ref 30.00–100.00)

## 2021-09-17 LAB — HEMOGLOBIN A1C: Hgb A1c MFr Bld: 5.8 % (ref 4.6–6.5)

## 2021-09-19 ENCOUNTER — Encounter: Payer: BC Managed Care – PPO | Admitting: Family Medicine

## 2021-09-20 ENCOUNTER — Ambulatory Visit (INDEPENDENT_AMBULATORY_CARE_PROVIDER_SITE_OTHER): Payer: BC Managed Care – PPO | Admitting: Family Medicine

## 2021-09-20 ENCOUNTER — Other Ambulatory Visit: Payer: Self-pay

## 2021-09-20 VITALS — BP 124/78 | HR 68 | Temp 97.6°F | Ht 69.0 in | Wt 203.4 lb

## 2021-09-20 DIAGNOSIS — M8588 Other specified disorders of bone density and structure, other site: Secondary | ICD-10-CM | POA: Diagnosis not present

## 2021-09-20 DIAGNOSIS — R7309 Other abnormal glucose: Secondary | ICD-10-CM

## 2021-09-20 DIAGNOSIS — Z0000 Encounter for general adult medical examination without abnormal findings: Secondary | ICD-10-CM | POA: Diagnosis not present

## 2021-09-20 DIAGNOSIS — E786 Lipoprotein deficiency: Secondary | ICD-10-CM

## 2021-09-20 DIAGNOSIS — K8020 Calculus of gallbladder without cholecystitis without obstruction: Secondary | ICD-10-CM

## 2021-09-20 DIAGNOSIS — N401 Enlarged prostate with lower urinary tract symptoms: Secondary | ICD-10-CM | POA: Diagnosis not present

## 2021-09-20 DIAGNOSIS — E876 Hypokalemia: Secondary | ICD-10-CM

## 2021-09-20 DIAGNOSIS — E559 Vitamin D deficiency, unspecified: Secondary | ICD-10-CM

## 2021-09-20 DIAGNOSIS — E781 Pure hyperglyceridemia: Secondary | ICD-10-CM

## 2021-09-20 DIAGNOSIS — R351 Nocturia: Secondary | ICD-10-CM

## 2021-09-20 DIAGNOSIS — M5416 Radiculopathy, lumbar region: Secondary | ICD-10-CM

## 2021-09-20 NOTE — Patient Instructions (Signed)
Work up to regular exercise gradually   Get back to a better diet   For cholesterol Avoid red meat/ fried foods/ egg yolks/ fatty breakfast meats/ butter, cheese and high fat dairy/ and shellfish   For sugar Try to get most of your carbohydrates from produce (with the exception of white potatoes)  Eat less bread/pasta/rice/snack foods/cereals/sweets and other items from the middle of the grocery store (processed carbs)

## 2021-09-20 NOTE — Assessment & Plan Note (Signed)
With gallstone pancreatitis  S/p ccy doing very well

## 2021-09-20 NOTE — Assessment & Plan Note (Signed)
Lab Results  Component Value Date   PSA 0.80 09/17/2021   PSA 0.75 09/11/2020   PSA 0.79 09/05/2019    No urinary changes

## 2021-09-20 NOTE — Assessment & Plan Note (Signed)
Lab Results  Component Value Date   HGBA1C 5.8 09/17/2021   Getting back to a better diet for this and triglycerides disc imp of low glycemic diet and wt loss to prevent DM2

## 2021-09-20 NOTE — Assessment & Plan Note (Signed)
Lumbar laminectomy and kyphoplasty in the fall  Gradually improving  Would like to get back to exercise

## 2021-09-20 NOTE — Progress Notes (Signed)
Subjective:    Patient ID: [name], male    DOB: 04-Dec-1975, 46 y.o.   MRN: [id number]  This visit occurred during the SARS-CoV-2 public health emergency.  Safety protocols were in place, including screening questions prior to the visit, additional usage of staff PPE, and extensive cleaning of exam room while observing appropriate contact time as indicated for disinfecting solutions.   HPI Here for health maintenance exam and to review chronic medical problems    Wt Readings from Last 3 Encounters:  09/20/21 203 lb 6 oz (92.3 kg)  08/01/21 200 lb 2 oz (90.8 kg)  07/23/21 200 lb 2 oz (90.8 kg)   30.03 kg/m  Some weight gain/not moving as much after health issues    He had ccy with cholangiogram for gallstone pancreatitis  A little loose stool, not more than before   Feeling better overall  About 90 % back to normal  Also back problems /surgery  Back at work without restrictions  Has not started any exercise program -the back specialist cleared him     Tdap 1/13 Flu shot -October Covid vaccinated- recent booster last mo  Prostate health  History of BPH Lab Results  Component Value Date   PSA 0.80 09/17/2021   PSA 0.75 09/11/2020   PSA 0.79 09/05/2019   No problems urinating at all     Osteopenia with h/o lumbar spine fracture  Taking fosamax, sees endocriology Dr Kelton Pillar Z score -2.4 at left forearm  Taking vitamin D and calcium  D level is 45.7   Lab Results  Component Value Date   TSH 0.97 09/17/2021      Also for spontaneous hypokalemia - working up for cushing syndrome -suppression test was normal    BP Readings from Last 3 Encounters:  09/20/21 124/78  08/01/21 115/83  07/23/21 131/77   Pulse Readings from Last 3 Encounters:  09/20/21 68  08/01/21 66  07/23/21 (!) 59     H/o elevated glucose level Lab Results  Component Value Date   HGBA1C 5.8 09/17/2021   Hyperlipidemia  Lab Results  Component Value Date   CHOL 196  09/17/2021   CHOL 171 09/11/2020   CHOL 196 09/05/2019   Lab Results  Component Value Date   HDL 36.40 (L) 09/17/2021   HDL 31.90 (L) 09/11/2020   HDL 36.30 (L) 09/05/2019   No results found for: [facility] Lab Results  Component Value Date   TRIG 288.0 (H) 09/17/2021   TRIG 257.0 (H) 09/11/2020   TRIG 236.0 (H) 09/05/2019   Lab Results  Component Value Date   CHOLHDL 5 09/17/2021   CHOLHDL 5 09/11/2020   CHOLHDL 5 09/05/2019   Lab Results  Component Value Date   LDLDIRECT 132.0 09/17/2021   LDLDIRECT 106.0 09/11/2020   LDLDIRECT 130.0 09/05/2019  Eating terribly  Getting back to normal   Lab Results  Component Value Date   ALT 25 09/17/2021   AST 21 09/17/2021   ALKPHOS 39 09/17/2021   BILITOT 0.5 09/17/2021    Lab Results  Component Value Date   WBC 6.9 09/17/2021   HGB 14.0 09/17/2021   HCT 42.1 09/17/2021   MCV 88.3 09/17/2021   PLT 197.0 09/17/2021   Lab Results  Component Value Date   CREATININE 0.74 09/17/2021   BUN 8 09/17/2021   NA 140 09/17/2021   K 4.2 09/17/2021   CL 103 09/17/2021   CO2 33 (H) 09/17/2021   Patient Active Problem List   Diagnosis  Date Noted   Osteopenia 06/18/2021   Genital warts 06/03/2021   Condyloma 06/03/2021   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   Other skin changes due to chronic exposure to nonionizing radiation 06/03/2021   Lumbar radiculopathy 05/14/2021   Stress reaction 03/07/2021   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   Kidney stone 06/13/2019   BPH (benign prostatic hyperplasia) 07/27/2018   Elevated glucose level 04/06/2017   Routine general medical examination at a health care facility 03/28/2016   Hypertriglyceridemia 02/21/2014   Low HDL (under 40) 02/21/2014   Vitamin D deficiency 02/21/2014   Allergic rhinitis 02/21/2014   Erectile dysfunction 08/19/2013   OSA (obstructive sleep apnea) 10/30/2011   Environmental allergies 10/27/2011   High risk medications (not  anticoagulants) long-term use 10/27/2011   Hypokalemia 09/29/2011   Arrhythmogenic RV cardiomyopathy 03/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   Implantable cardioverter-defibrillator (ICD) in situ 01/10/2011   PTSD (post-traumatic stress disorder) 2/2 ICD shocks 01/10/2011   Fatty liver 01/04/2009   GERD 12/05/2008   History of ventricular tachycardia 11/03/2008   ECZEMA 08/17/2007   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   Asthma when younger   Fatty liver    History of chicken pox    History of kidney stones    ICD (implantable cardiac defibrillator) in place    Osteopenia    Pancreatitis    Sleep apnea    uses cpap   Ventricular tachycardia    sotalol therapy;  catheter ablation at [facility] 10/10 and [facility] 2013   Past Surgical History:  Procedure Laterality Date   CARDIAC CATHETERIZATION  06/01/2006   CARDIAC DEFIBRILLATOR PLACEMENT  06/03/2006   [employer]   CHOLECYSTECTOMY N/A 08/01/2021   Procedure: LAPAROSCOPIC CHOLECYSTECTOMY WITH INTRAOPERATIVE CHOLANGIOGRAM;  Surgeon: Erroll Luna, MD;  Location: [facility];  Service: General;  Laterality: N/A;   HAND SURGERY Left    ICD GENERATOR CHANGEOUT N/A 11/04/2019   Procedure: ICD [place];  Surgeon: Deboraha Sprang, MD;  Location: [place] [facility];  Service: Cardiovascular;  Laterality: N/A;   IMPLANTABLE CARDIOVERTER DEFIBRILLATOR GENERATOR CHANGE N/A 12/11/2011   Procedure: IMPLANTABLE CARDIOVERTER DEFIBRILLATOR GENERATOR CHANGE;  Surgeon: Deboraha Sprang, MD;  Location: [facility] CATH LAB;  Service: Cardiovascular;  Laterality: N/A;   KYPHOPLASTY N/A 05/14/2021   Procedure: Lumbar Five Vertebroplasty;  Surgeon: Judith Part, MD;  Location: [place];  Service: Neurosurgery;  Laterality: N/A;   LUMBAR LAMINECTOMY/ DECOMPRESSION WITH MET-RX N/A 05/14/2021   Procedure: Lumbar Four-Five Minimally Invasive  Laminectomy;  Surgeon: Judith Part, MD;  Location: [place];  Service: Neurosurgery;  Laterality: N/A;  3C/RM 19   TONSILLECTOMY     vt ablation  10/10, 10/13   x 2   WISDOM TOOTH EXTRACTION     Social History   Tobacco Use   Smoking status: Former    Packs/day: 1.00    Years: 18.00    Pack years: 18.00    Types: Cigarettes    Start date: 01/17/1990    Quit date: 08/19/2007    Years since quitting: 14.1   Smokeless tobacco: Never  Vaping Use   Vaping Use: Never used  Substance Use Topics   Alcohol use: No    Alcohol/week: 0.0 standard drinks   Drug use: No   Family History  Problem Relation Age of Onset   Sudden death Father    Asthma Mother  Diabetes Other    Stroke Other    Heart attack Other    Prostate cancer Other    Breast cancer Other    Ovarian cancer Other    Uterine cancer Other    Colon cancer Other    Drug abuse Other    Depression Other    Lung cancer Maternal Grandfather    Heart disease Maternal Grandfather    Heart disease Paternal Grandfather    Hypertension Paternal Grandmother    Allergies  Allergen Reactions   Bee Venom Anaphylaxis   Lidocaine     [name]   Current Outpatient Medications on File Prior to Visit  Medication Sig Dispense Refill   alendronate (FOSAMAX) 70 MG tablet Take 1 tablet (70 mg total) by mouth every 7 (seven) days. Take with a full glass of water on an empty stomach. 13 tablet 3   Ascorbic Acid (VITAMIN C) 1000 MG tablet Take 1,000 mg by mouth at bedtime.     Calcium-Phosphorus-Vitamin D (CITRACAL +D3 PO) Take 1 tablet by mouth daily.     cetirizine (ZYRTEC) 10 MG tablet Take 10 mg by mouth daily as needed for allergies.     Cholecalciferol (VITAMIN D3) 50 MCG (2000 UT) TABS Take 2,000 Units by mouth daily.     KLOR-CON M10 10 MEQ tablet TAKE 4 TABLETS BY MOUTH TWICE A DAY 720 tablet 3   magnesium oxide (MAG-OX) 400 (240 Mg) MG tablet TAKE 1 TABLET BY MOUTH TWICE A DAY 180 tablet 2   montelukast (SINGULAIR) 10  MG tablet TAKE 1 TABLET BY MOUTH EVERY DAY 90 tablet 1   Multiple Vitamin (MULITIVITAMIN WITH MINERALS) TABS Take 1 tablet by mouth daily.     SOTALOL AF 120 MG TABS TAKE 1 & 1/2 TABLETS BY MOUTH TWICE DAILY 270 tablet 2   tadalafil (CIALIS) 5 MG tablet Take 5 mg by mouth daily as needed for erectile dysfunction.     vitamin E 180 MG (400 UNITS) capsule Take 400 Units by mouth at bedtime.     Zinc 30 MG CAPS Take 30 mg by mouth at bedtime.     No current facility-administered medications on file prior to visit.     Review of Systems  Constitutional:  Positive for fatigue. Negative for activity change, appetite change, fever and unexpected weight change.  HENT:  Negative for congestion, rhinorrhea, sore throat and trouble swallowing.   Eyes:  Negative for pain, redness, itching and visual disturbance.  Respiratory:  Negative for cough, chest tightness, shortness of breath and wheezing.   Cardiovascular:  Negative for chest pain and palpitations.  Gastrointestinal:  Negative for abdominal pain, blood in stool, constipation, diarrhea and nausea.       No more symptoms of pancreatitis   Endocrine: Negative for cold intolerance, heat intolerance, polydipsia and polyuria.  Genitourinary:  Negative for difficulty urinating, dysuria, frequency and urgency.  Musculoskeletal:  Positive for back pain. Negative for arthralgias, joint swelling and myalgias.       Improving since surgery   Skin:  Negative for pallor and rash.  Neurological:  Negative for dizziness, tremors, weakness, numbness and headaches.  Hematological:  Negative for adenopathy. Does not bruise/bleed easily.  Psychiatric/Behavioral:  Negative for decreased concentration and dysphoric mood. The patient is not nervous/anxious.       Objective:   Physical Exam Constitutional:      General: He is not in acute distress.    Appearance: Normal appearance. He is well-developed and normal weight.  He is not ill-appearing or diaphoretic.   HENT:     Head: Normocephalic and atraumatic.     Right Ear: Tympanic membrane, ear canal and external ear normal.     Left Ear: Tympanic membrane, ear canal and external ear normal.     Nose: Nose normal. No congestion.     Mouth/Throat:     Mouth: Mucous membranes are moist.     Pharynx: Oropharynx is clear. No posterior oropharyngeal erythema.  Eyes:     General: No scleral icterus.       Right eye: No discharge.        Left eye: No discharge.     Conjunctiva/sclera: Conjunctivae normal.     Pupils: Pupils are equal, round, and reactive to light.  Neck:     Thyroid: No thyromegaly.     Vascular: No carotid bruit or JVD.  Cardiovascular:     Rate and Rhythm: Normal rate and regular rhythm.     Pulses: Normal pulses.     Heart sounds: Normal heart sounds.    No gallop.  Pulmonary:     Effort: Pulmonary effort is normal. No respiratory distress.     Breath sounds: Normal breath sounds. No wheezing or rales.     Comments: Good air exch Chest:     Chest wall: No tenderness.  Abdominal:     General: Bowel sounds are normal. There is no distension or abdominal bruit.     Palpations: Abdomen is soft. There is no mass.     Tenderness: There is no abdominal tenderness.     Hernia: No hernia is present.  Musculoskeletal:        General: No tenderness.     Cervical back: Normal range of motion and neck supple. No rigidity. No muscular tenderness.     Right lower leg: No edema.     Left lower leg: No edema.     Comments: No kyphosis  No acute joint changes   Lymphadenopathy:     Cervical: No cervical adenopathy.  Skin:    General: Skin is warm and dry.     Coloration: Skin is not pale.     Findings: No erythema or rash.     Comments: Solar lentigines diffusely   Neurological:     Mental Status: He is alert.     Cranial Nerves: No cranial nerve deficit.     Motor: No abnormal muscle tone.     Coordination: Coordination normal.     Gait: Gait normal.     Deep Tendon  Reflexes: Reflexes are normal and symmetric. Reflexes normal.  Psychiatric:        Mood and Affect: Mood normal.        Cognition and Memory: Cognition and memory normal.          Assessment & Plan:   Problem List Items Addressed This Visit       Digestive   RESOLVED: Gallstones    With gallstone pancreatitis  S/p ccy doing very well        Nervous and Auditory   Lumbar radiculopathy    Lumbar laminectomy and kyphoplasty in the fall  Gradually improving  Would like to get back to exercise          Musculoskeletal and Integument   Osteopenia    Sees endocrionolgy Dr Kelton Pillar  H/o lumbar comp fx with kyphoplasty  Osteopenia- Z score -2.4 in L forearm  On ca and D  Taking fosamax and tolerates well  No cause found  Has been w/o for cushings         Genitourinary   BPH (benign prostatic hyperplasia)    Lab Results  Component Value Date   PSA 0.80 09/17/2021   PSA 0.75 09/11/2020   PSA 0.79 09/05/2019   No urinary changes        Other   Elevated glucose level    Lab Results  Component Value Date   HGBA1C 5.8 09/17/2021  Getting back to a better diet for this and triglycerides disc imp of low glycemic diet and wt loss to prevent DM2       Hypertriglyceridemia    With low HDL Diet is improving after recent health problems  Disc goals for lipids and reasons to control them Rev last labs with pt Rev low sat fat diet in detail       Hypokalemia   Low HDL (under 40)    HDL improved but still below goal  Diet has not ben optimal but getting back to normal now  Encouraged more exercise as he recovers  Triglycerides are up  Omega 3 supplementation may also help      Routine general medical examination at a health care facility - Primary    Reviewed health habits including diet and exercise and skin cancer prevention Reviewed appropriate screening tests for age  Also reviewed health mt list, fam hx and immunization status , as well as social  and family history   See HPI Labs reviewed  Getting back on track with exercise  psa is stable  utd dexa with osteopenia  Will consider colon cancer screening at age 4 Discussed use of sun protection       Vitamin D deficiency    In setting of osteopenia with spinal compression fracture   Taking vit D3 2000 iu daily  Discussed importance to bone and overall health

## 2021-09-20 NOTE — Assessment & Plan Note (Signed)
Sees endocrionolgy Dr Kelton Pillar  H/o lumbar comp fx with kyphoplasty  Osteopenia- Z score -2.4 in L forearm  On ca and D  Taking fosamax and tolerates well   No cause found  Has been w/o for cushings

## 2021-09-22 ENCOUNTER — Encounter: Payer: Self-pay | Admitting: Family Medicine

## 2021-09-22 NOTE — Assessment & Plan Note (Signed)
With low HDL Diet is improving after recent health problems  Disc goals for lipids and reasons to control them Rev last labs with pt Rev low sat fat diet in detail

## 2021-09-22 NOTE — Assessment & Plan Note (Signed)
In setting of osteopenia with spinal compression fracture   Taking vit D3 2000 iu daily  Discussed importance to bone and overall health

## 2021-09-22 NOTE — Assessment & Plan Note (Signed)
HDL improved but still below goal  Diet has not ben optimal but getting back to normal now  Encouraged more exercise as he recovers  Triglycerides are up  Omega 3 supplementation may also help

## 2021-09-22 NOTE — Assessment & Plan Note (Signed)
Reviewed health habits including diet and exercise and skin cancer prevention Reviewed appropriate screening tests for age  Also reviewed health mt list, fam hx and immunization status , as well as social and family history   See HPI Labs reviewed  Getting back on track with exercise  psa is stable  utd dexa with osteopenia  Will consider colon cancer screening at age 46 Discussed use of sun protection

## 2021-10-15 ENCOUNTER — Ambulatory Visit: Payer: BC Managed Care – PPO | Admitting: Podiatry

## 2021-10-15 ENCOUNTER — Ambulatory Visit: Payer: BC Managed Care – PPO

## 2021-10-15 ENCOUNTER — Encounter: Payer: Self-pay | Admitting: Podiatry

## 2021-10-15 ENCOUNTER — Other Ambulatory Visit: Payer: Self-pay

## 2021-10-15 ENCOUNTER — Ambulatory Visit (INDEPENDENT_AMBULATORY_CARE_PROVIDER_SITE_OTHER): Payer: BC Managed Care – PPO

## 2021-10-15 DIAGNOSIS — S9032XA Contusion of left foot, initial encounter: Secondary | ICD-10-CM | POA: Diagnosis not present

## 2021-10-15 NOTE — Progress Notes (Signed)
Subjective:  Patient ID: [name], male    DOB: 09/26/1975,  MRN: [id number] HPI Chief Complaint  Patient presents with   Foot Injury    Dorsal forefoot left - roll of paper fell onto foot x 2 weeks ago, initially very bruised and swollen, still some bruising and tenderness, just diagnosed with osteopenia in back, icing it at home   New Patient (Initial Visit)    Est pt 2019    46 y.o. male presents with the above complaint.   ROS: Denies fever chills nausea vomit muscle aches pains calf pain back pain chest pain shortness of breath.  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   Asthma when younger   Fatty liver    History of chicken pox    History of kidney stones    ICD (implantable cardiac defibrillator) in place    Osteopenia    Pancreatitis    Sleep apnea    uses cpap   Ventricular tachycardia    sotalol therapy;  catheter ablation at [facility] 10/10 and [facility] 2013   Past Surgical History:  Procedure Laterality Date   CARDIAC CATHETERIZATION  06/01/2006   CARDIAC DEFIBRILLATOR PLACEMENT  06/03/2006   [employer]   CHOLECYSTECTOMY N/A 08/01/2021   Procedure: LAPAROSCOPIC CHOLECYSTECTOMY WITH INTRAOPERATIVE CHOLANGIOGRAM;  Surgeon: Erroll Luna, MD;  Location: [facility];  Service: General;  Laterality: N/A;   HAND SURGERY Left    ICD GENERATOR CHANGEOUT N/A 11/04/2019   Procedure: ICD [place];  Surgeon: Deboraha Sprang, MD;  Location: [place] [facility];  Service: Cardiovascular;  Laterality: N/A;   IMPLANTABLE CARDIOVERTER DEFIBRILLATOR GENERATOR CHANGE N/A 12/11/2011   Procedure: IMPLANTABLE CARDIOVERTER DEFIBRILLATOR GENERATOR CHANGE;  Surgeon: Deboraha Sprang, MD;  Location: [facility] CATH [facility];  Service: Cardiovascular;  Laterality: N/A;   KYPHOPLASTY N/A 05/14/2021   Procedure: Lumbar Five Vertebroplasty;  Surgeon: Judith Part, MD;  Location: [place];  Service: Neurosurgery;  Laterality: N/A;   LUMBAR LAMINECTOMY/ DECOMPRESSION WITH MET-RX N/A 05/14/2021   Procedure: Lumbar Four-Five Minimally Invasive Laminectomy;  Surgeon: Judith Part, MD;  Location: [place];  Service: Neurosurgery;  Laterality: N/A;  3C/RM 19   TONSILLECTOMY     vt ablation  10/10, 10/13   x 2   WISDOM TOOTH EXTRACTION      Current Outpatient Medications:    alendronate (FOSAMAX) 70 MG tablet, Take 1 tablet (70 mg total) by mouth every 7 (seven) days. Take with a full glass of water on an empty stomach., Disp: 13 tablet, Rfl: 3   Ascorbic Acid (VITAMIN C) 1000 MG tablet, Take 1,000 mg by mouth at bedtime., Disp: , Rfl:    Calcium-Phosphorus-Vitamin D (CITRACAL +D3 PO), Take 1 tablet by mouth daily., Disp: , Rfl:    cetirizine (ZYRTEC) 10 MG tablet, Take 10 mg by mouth daily as needed for allergies., Disp: , Rfl:    Cholecalciferol (VITAMIN D3) 50 MCG (2000 UT) TABS, Take 2,000 Units by mouth daily., Disp: , Rfl:    KLOR-CON M10 10 MEQ tablet, TAKE 4 TABLETS BY MOUTH TWICE A DAY, Disp: 720 tablet, Rfl: 3   magnesium oxide (MAG-OX) 400 (240 Mg) MG tablet, TAKE 1 TABLET BY MOUTH TWICE A DAY, Disp: 180 tablet, Rfl: 2   montelukast (SINGULAIR) 10 MG tablet, TAKE 1 TABLET BY MOUTH EVERY DAY, Disp: 90 tablet, Rfl: 1  Multiple Vitamin (MULITIVITAMIN WITH MINERALS) TABS, Take 1 tablet by mouth daily., Disp: , Rfl:    SOTALOL AF 120 MG TABS, TAKE 1 & 1/2 TABLETS BY MOUTH TWICE DAILY, Disp: 270 tablet, Rfl: 2   tadalafil (CIALIS) 5 MG tablet, Take 5 mg by mouth daily as needed for erectile dysfunction., Disp: , Rfl:    vitamin E 180 MG (400 UNITS) capsule, Take 400 Units by mouth at bedtime., Disp: , Rfl:    Zinc 30 MG CAPS, Take 30 mg by mouth at bedtime., Disp: , Rfl:   Allergies  Allergen Reactions   Bee Venom Anaphylaxis   Lidocaine     [name]   Review of Systems Objective:  There were no vitals filed for this  visit.  General: Well developed, nourished, in no acute distress, alert and oriented x3   Dermatological: Skin is warm, dry and supple bilateral. Nails x 10 are well maintained; remaining integument appears unremarkable at this time. There are no open sores, no preulcerative lesions, no rash or signs of infection present.  Vascular: Dorsalis Pedis artery and Posterior Tibial artery pedal pulses are 2/4 bilateral with immedate capillary fill time. Pedal hair growth present. No varicosities and no lower extremity edema present bilateral.   Neruologic: Grossly intact via light touch bilateral. Vibratory intact via tuning fork bilateral. Protective threshold with Semmes Wienstein monofilament intact to all pedal sites bilateral. Patellar and Achilles deep tendon reflexes 2+ bilateral. No Babinski or clonus noted bilateral.   Musculoskeletal: No gross boney pedal deformities bilateral. No pain, crepitus, or limitation noted with foot and ankle range of motion bilateral. Muscular strength 5/5 in all groups tested bilateral.  He does have some ecchymosis overlying the metatarsal phalangeal joints but there is no edema.  Gait: Unassisted, Nonantalgic.    Radiographs:  Radiographs do not demonstrate any acute trauma to the foot.  Osseously mature individual  Assessment & Plan:   Assessment: Contusion foot left  Plan: Follow-up with me as needed     Moss Berry T. [place], [place]

## 2021-10-24 ENCOUNTER — Other Ambulatory Visit: Payer: Self-pay

## 2021-10-24 ENCOUNTER — Encounter: Payer: Self-pay | Admitting: Internal Medicine

## 2021-10-24 ENCOUNTER — Ambulatory Visit: Payer: BC Managed Care – PPO | Admitting: Internal Medicine

## 2021-10-24 VITALS — BP 122/70 | HR 78 | Ht 69.0 in | Wt 207.4 lb

## 2021-10-24 DIAGNOSIS — M8080XA Other osteoporosis with current pathological fracture, unspecified site, initial encounter for fracture: Secondary | ICD-10-CM | POA: Insufficient documentation

## 2021-10-24 DIAGNOSIS — M810 Age-related osteoporosis without current pathological fracture: Secondary | ICD-10-CM | POA: Diagnosis not present

## 2021-10-24 DIAGNOSIS — M8080XS Other osteoporosis with current pathological fracture, unspecified site, sequela: Secondary | ICD-10-CM
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
## 2021-10-24 NOTE — Patient Instructions (Signed)
Alendronate 70 mg weekly  ?Total calcium between 1000-1200 a day ( Citracal PLUS Multivitamin ) ?Vitamin D3 2000 iu daily  ?

## 2021-11-04 ENCOUNTER — Ambulatory Visit (INDEPENDENT_AMBULATORY_CARE_PROVIDER_SITE_OTHER): Payer: BC Managed Care – PPO

## 2021-11-04 DIAGNOSIS — I472 Ventricular tachycardia, unspecified: Secondary | ICD-10-CM

## 2021-11-05 LAB — CUP PACEART REMOTE DEVICE CHECK
Battery Remaining Longevity: 125 mo
Battery Voltage: 3.03 V
Brady Statistic RV Percent Paced: 0.33 %
Date Time Interrogation Session: 20230320012201
HighPow Impedance: 52 Ohm
HighPow Impedance: 89 Ohm
Implantable Lead Implant Date: 20071018
Implantable Lead Location: 753860
Implantable Lead Model: 6947
Implantable Pulse Generator Implant Date: 20210319
Lead Channel Impedance Value: 361 Ohm
Lead Channel Impedance Value: 418 Ohm
Lead Channel Pacing Threshold Amplitude: 0.75 V
Lead Channel Pacing Threshold Pulse Width: 0.4 ms
Lead Channel Sensing Intrinsic Amplitude: 12.625 mV
Lead Channel Sensing Intrinsic Amplitude: 12.625 mV
Lead Channel Setting Pacing Amplitude: 2 V
Lead Channel Setting Pacing Pulse Width: 0.4 ms
Lead Channel Setting Sensing Sensitivity: 1.2 mV

## 2021-11-19 NOTE — Progress Notes (Signed)
Remote ICD transmission.   

## 2021-12-11 ENCOUNTER — Telehealth: Payer: Self-pay

## 2021-12-11 ENCOUNTER — Ambulatory Visit: Payer: BC Managed Care – PPO | Admitting: Family Medicine

## 2021-12-11 ENCOUNTER — Encounter: Payer: Self-pay | Admitting: Family Medicine

## 2021-12-11 VITALS — BP 120/80 | HR 67 | Temp 98.4°F | Ht 69.0 in | Wt 200.4 lb

## 2021-12-11 DIAGNOSIS — I428 Other cardiomyopathies: Secondary | ICD-10-CM | POA: Diagnosis not present

## 2021-12-11 DIAGNOSIS — R197 Diarrhea, unspecified: Secondary | ICD-10-CM | POA: Diagnosis not present

## 2021-12-11 DIAGNOSIS — R110 Nausea: Secondary | ICD-10-CM | POA: Diagnosis not present

## 2021-12-11 DIAGNOSIS — R1084 Generalized abdominal pain: Secondary | ICD-10-CM

## 2021-12-11 DIAGNOSIS — K760 Fatty (change of) liver, not elsewhere classified: Secondary | ICD-10-CM

## 2021-12-11 LAB — HEPATIC FUNCTION PANEL
ALT: 36 U/L (ref 0–53)
AST: 33 U/L (ref 0–37)
Albumin: 4.7 g/dL (ref 3.5–5.2)
Alkaline Phosphatase: 40 U/L (ref 39–117)
Bilirubin, Direct: 0.1 mg/dL (ref 0.0–0.3)
Total Bilirubin: 0.6 mg/dL (ref 0.2–1.2)
Total Protein: 7.9 g/dL (ref 6.0–8.3)

## 2021-12-11 LAB — CBC WITH DIFFERENTIAL/PLATELET
Basophils Absolute: 0 10*3/uL (ref 0.0–0.1)
Basophils Relative: 0.4 % (ref 0.0–3.0)
Eosinophils Absolute: 0.1 10*3/uL (ref 0.0–0.7)
Eosinophils Relative: 0.9 % (ref 0.0–5.0)
HCT: 40.9 % (ref 39.0–52.0)
Hemoglobin: 14 g/dL (ref 13.0–17.0)
Lymphocytes Relative: 33.8 % (ref 12.0–46.0)
Lymphs Abs: 2.7 10*3/uL (ref 0.7–4.0)
MCHC: 34.3 g/dL (ref 30.0–36.0)
MCV: 88 fl (ref 78.0–100.0)
Monocytes Absolute: 1.1 10*3/uL — ABNORMAL HIGH (ref 0.1–1.0)
Monocytes Relative: 13.9 % — ABNORMAL HIGH (ref 3.0–12.0)
Neutro Abs: 4.1 10*3/uL (ref 1.4–7.7)
Neutrophils Relative %: 51 % (ref 43.0–77.0)
Platelets: 227 10*3/uL (ref 150.0–400.0)
RBC: 4.65 Mil/uL (ref 4.22–5.81)
RDW: 13.1 % (ref 11.5–15.5)
WBC: 8.1 10*3/uL (ref 4.0–10.5)

## 2021-12-11 LAB — LIPASE: Lipase: 37 U/L (ref 11.0–59.0)

## 2021-12-11 NOTE — Telephone Encounter (Signed)
[facility] - Client ?TELEPHONE ADVICE RECORD ?Access[facility]urse? ?Patient ?Name: ?[name] ?OW ?Gender: Male ?DOB: 04-17-1976 ?Age: 46 Y 29 M 18 D ?Return ?Phone ?Number: ?[id number] ?(Primary) ?Address: ?City/ ?State/ ?Zip: [name] ? [address] ?Client [facility] - Client ?Client Site [facility] ?Contact Type Call ?Who Is Calling Patient / Member / Family / Caregiver ?Call Type Triage / Clinical ?Relationship To Patient Self ?Return Phone Number [phone] (Primary) ?Chief Complaint Jaundice ?Reason for Call Symptomatic / Request for Health Information ?Initial Comment Caller states he has stomach issues, his wife think ?he is turning yellow, diarrhea since Sunday, fatty ?liver history, had a gallbladder removal recently. ?Caller needs to schedule an appt. ?Translation No ?Nurse Assessment ?Nurse: Radford Pax, RN, Eugene Garnet Date/Time Eilene Ghazi Time): 12/11/2021 9:19:52 AM ?Confirm and document reason for call. If ?symptomatic, describe symptoms. ?---Had HA, abdominal pain, diarrhea starting over ?the weekend. Feeling better today. Pt's wife thinks he ?looks yellow around the eyes. ?Does the patient have any new or worsening ?symptoms? ---Yes ?Will a triage be completed? ---Yes ?Related visit to physician within the last 2 weeks? ---No ?Does the PT have any chronic conditions? (i.e. ?diabetes, asthma, this includes High risk factors for ?pregnancy, etc.) ?---Yes ?List chronic conditions. ---hx acute pancreatitis, gallbladder has been removed, ?back pain ?Is this a behavioral health or substance abuse call? ---No ?Guidelines ?Guideline Title Affirmed Question Affirmed Notes Nurse Date/Time (Eastern ?Time) ?Jaundice Abdominal pain Radford Pax, RN, Eugene Garnet 12/11/2021 9:25:16 ?AM ?Disp. Time (Eastern ?Time) Disposition Final User ?12/11/2021 9:33:05 AM See HCP within 4 Hours (or ?PCP triage) ?Yes Turner, RN, Eugene Garnet ?PLEASE NOTE: All timestamps contained  within this report are represented as [place] Standard Time. ?CONFIDENTIALTY NOTICE: This fax transmission is intended only for the addressee. It contains information that is legally privileged, confidential or ?otherwise protected from use or disclosure. If you are not the intended recipient, you are strictly prohibited from reviewing, disclosing, copying using ?or disseminating any of this information or taking any action in reliance on or regarding this information. If you have received this fax in error, please ?notify us immediately by telephone so that we can arrange for its return to [place]. Phone: [phone], Toll-Free: [phone], Fax: [phone] ?Page: 2 of 2 ?Call Id: [id number] ?Caller Disagree/Comply Comply ?Caller Understands Yes ?PreDisposition Call Doctor ?Care Advice Given Per Guideline ?SEE HCP (OR PCP TRIAGE) WITHIN 4 HOURS: * IF OFFICE WILL BE OPEN: You need to be seen within the next 3 or 4 ?hours. Call your doctor (or NP/PA) now or as soon as the office opens. CALL BACK IF: * You become worse CARE ADVICE ?given per Jaundice (Adult) guideline. ?Comments ?User: Susie Cassette, RN Date/Time Eilene Ghazi Time): 12/11/2021 9:34:05 AM ?Was able to warm transfer to office for an appt ?Referrals ?Warm transfer to backlin ?

## 2021-12-11 NOTE — Progress Notes (Signed)
? ? ?[name] T. Alexsis Branscom, MD, [facility] ?Therapist, music at [facility] ?[address] ?[place], [address] ? ?Phone: [phone]  FAX: [phone] ? ?[name] - 46 y.o. male  MRN [id number]  Date of Birth: February 01, 1976 ? ?Date: 12/11/2021  PCP: Abner Greenspan, MD  Referral: Abner Greenspan, MD ? ?Chief Complaint  ?Patient presents with  ? Diarrhea  ?  Started Sunday Morning ?  ? Abdominal Pain  ? Jaundice  ?  Wife concerned that he looks yellow around his eyes  ? ? ?This visit occurred during the SARS-CoV-2 public health emergency.  Safety protocols were in place, including screening questions prior to the visit, additional usage of staff PPE, and extensive cleaning of exam room while observing appropriate contact time as indicated for disinfecting solutions.  ? ?Subjective:  ? ?[name] is a 46 y.o. very pleasant male patient with Body mass index is 29.59 kg/m?. who presents with the following: ? ?H/o arhythmogenic r ventricular cardiomyopathy s/p ICD, but now with abdominal pain, jaundice?, diarrhea. ? ?For the past few days the patient has had some subjective fevers and chills.  He is also had diarrhea roughly every hour, and this morning about 4 in the morning it did start to slow up some.  He is now having a at least somewhat firm stool.  He is using some Imodium occasionally, this has helped. ? ? ? ?He is here a lot in part because his wife was concerned he may have had a jaundice coloration to his eyes.  He does have a known history of fatty liver.  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 ? ?Rough couple of days, then had some fever and chills.  Had some diarrhea.  Every couple of hours, and then this AM it did slow up some.  Took some immodium. ? ?This morning was able to have more of a solid stool. ? ?Check full hep panel ?Liver, lipase, etc ?Lower leg swelling - sob ? ? ? ?Cholecsytectomy 07/2021. ? ? ? ?Review of Systems is  noted in the HPI, as appropriate ? ?Objective:  ? ?BP 120/80   Pulse 67   Temp 98.4 ?F (36.9 ?C) (Oral)   Ht '5\' 9"'$  (1.753 m)   Wt 200 lb 6 oz (90.9 kg)   SpO2 98%   BMI 29.59 kg/m?  ? ?GEN: No acute distress; alert,appropriate. ?PULM: Breathing comfortably in no respiratory distress ?PSYCH: Normally interactive.  ?CV: RRR, no m/g/r  ?PULM: Normal respiratory rate, no accessory muscle use. No wheezes, crackles or rhonchi  ?ABD: S, mild generalized abd tenderness, ND, + BS, No rebound, No HSM  ? ?Laboratory and Imaging Data: ? ?Assessment and Plan:  ? ?  ICD-10-CM   ?1. Acute generalized abdominal pain  R10.84 Hepatic function panel  ?  Lipase  ?  CBC with Differential/Platelet  ?  ?2. Nausea  R11.0 Hepatic function panel  ?  Lipase  ?  CBC with Differential/Platelet  ?  ?3. Diarrhea, unspecified type  R19.7 Hepatic function panel  ?  Lipase  ?  CBC with Differential/Platelet  ?  ?4. 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HCC)  I42.8   ?  ?5. Fatty liver  K76.0   ?  ? ?Likely resolving viral gastroenteritis. ? ?I do not appreciate any jaundice, but certainly this could have been present earlier.  He does have a history of fatty liver.  I am get  a check a liver panel as well as a lipase. ? ?He does have a history of fatty liver, something is reasonable to check all of these things. ? ?No orders of the defined types were placed in this encounter. ? ?There are no discontinued medications. ?Orders Placed This Encounter  ?Procedures  ? Hepatic function panel  ? Lipase  ? CBC with Differential/Platelet  ? ? ?Follow-up: No follow-ups on file. ? ?Dragon [employer] One speech-to-text software was used for transcription in this dictation.  Possible transcriptional errors can occur using Editor, commissioning.  ? ?Signed, ? ?Mimie Goering T. Jaryd Drew, MD ? ? ?Outpatient Encounter Medications as of 12/11/2021  ?Medication Sig  ? alendronate (FOSAMAX) 70 MG tablet Take 1 tablet (70 mg total) by mouth every 7 (seven) days. Take with  a full glass of water on an empty stomach.  ? Ascorbic Acid (VITAMIN C) 1000 MG tablet Take 1,000 mg by mouth at bedtime.  ? Calcium-Phosphorus-Vitamin D (CITRACAL +D3 PO) Take 1 tablet by mouth daily.  ? cetirizine (ZYRTEC) 10 MG tablet Take 10 mg by mouth daily as needed for allergies.  ? Cholecalciferol (VITAMIN D3) 50 MCG (2000 UT) TABS Take 2,000 Units by mouth daily.  ? KLOR-CON M10 10 MEQ tablet TAKE 4 TABLETS BY MOUTH TWICE A DAY  ? magnesium oxide (MAG-OX) 400 (240 Mg) MG tablet TAKE 1 TABLET BY MOUTH TWICE A DAY  ? montelukast (SINGULAIR) 10 MG tablet TAKE 1 TABLET BY MOUTH EVERY DAY  ? Multiple Vitamin (MULITIVITAMIN WITH MINERALS) TABS Take 1 tablet by mouth daily.  ? SOTALOL AF 120 MG TABS TAKE 1 & 1/2 TABLETS BY MOUTH TWICE DAILY  ? tadalafil (CIALIS) 5 MG tablet Take 5 mg by mouth daily as needed for erectile dysfunction.  ? vitamin E 180 MG (400 UNITS) capsule Take 400 Units by mouth at bedtime.  ? Zinc 30 MG CAPS Take 30 mg by mouth at bedtime.  ? ?No facility-administered encounter medications on file as of 12/11/2021.  ?  ?

## 2021-12-11 NOTE — Telephone Encounter (Signed)
Pt already has appt with Dr Lorelei Pont on 12/11/21 at 12 noon. Sending note to Dr Lorelei Pont and Butch Penny CMA. ?

## 2021-12-20 ENCOUNTER — Encounter: Payer: Self-pay | Admitting: Internal Medicine

## 2021-12-20 ENCOUNTER — Telehealth: Payer: Self-pay | Admitting: Internal Medicine

## 2021-12-20 NOTE — Telephone Encounter (Signed)
Spoke with patient, he states he's not having an acute issue with swelling or SOB, but is concerned with the symptoms he's been having and his condition. He would like to see Dr. Caryl Comes or APP to be evaluated and possibly have an echocardiogram ordered for SOB and potential HF. ? ?Will forward to Dr. Olin Pia nurse Rosann Auerbach to reach out and schedule patient for appointment with Dr. Caryl Comes. ?

## 2021-12-20 NOTE — Telephone Encounter (Signed)
Error

## 2021-12-20 NOTE — Telephone Encounter (Signed)
Pt c/o swelling: STAT is pt has developed SOB within 24 hours ? ?If swelling, where is the swelling located? bilateral lower extremities / ankles / feet - they are looking pretty good now though ? ?How much weight have you gained and in what time span? I gained 10-15 lbs in a few months but lost about 10 lbs in 3 days with a GI bug I had about 2 weeks ago - I seem to be maintaining steady around 196 now ? ?Have you gained 3 pounds in a day or 5 pounds in a week? I had a period where I gained 5 lbs in a week ? ?Do you have a log of your daily weights (if so, list)?  ?No consistent daily log ? ?Are you currently taking a fluid pill? No ? ?Are you currently SOB? Mild SOB when doing various types of strenuous activity ? ?Have you traveled recently? No out of state travel  ? ?Patient sent answers via patient schedule requesting an appointment with Dr. Caryl Comes regarding this. Also states: "Have had some increased fatigue, worsening exertional shortness of breath, and periods of lower extremity swelling. Recently spoke to a member of my care team while my sister is in hospital for CHF and it was suggested to be seen sooner rather than later due to [place]." ?

## 2021-12-30 NOTE — Progress Notes (Signed)
Electrophysiology Office Note Date: 01/07/2022  ID:  [name], DOB 10-31-1975, MRN [id number]  PCP: Abner Greenspan, MD Primary Cardiologist: [name], MD Electrophysiologist: [name], MD   CC: Routine ICD follow-up  [name] is a 46 y.o. male seen today for [name], MD for acute visit due to SOB and edema.  Since last being seen in our clinic the patient reports more fatigue and intermittent SOB since back surgery.   He feels like his palpitations have also been more frequent and more severe. 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 He requires longer to "recoup" from a 24 hr shift. He notices at times he is winded after playing with his little girl. He states he has been far less active overall and gained weight since his back surgery. He had several days of ankle edema a few weeks ago, but then had a stomach bug and hasn't had any re-accumulation since.   he denies chest pain, palpitations, PND, orthopnea, nausea, vomiting, dizziness, syncope, or early satiety. He has not had ICD shocks.   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   Asthma when younger   Fatty liver    History of chicken pox    History of kidney stones    ICD (implantable cardiac defibrillator) in place    Osteopenia    Pancreatitis    Sleep apnea    uses cpap   Ventricular tachycardia (HCC)    sotalol therapy;  catheter [facility] 10[facility]10 and [facility] 2013   Past Surgical History:  Procedure Laterality Date   CARDIAC CATHETERIZATION  06/01/2006   CARDIAC DEFIBRILLATOR PLACEMENT  06/03/2006   Medtronic   CHOLECYSTECTOMY N/A 08/01/2021   Procedure: LAPAROSCOPIC CHOLECYSTECTOMY WITH  INTRAOPERATIVE CHOLANGIOGRAM;  Surgeon: Erroll Luna, MD;  Location: [facility];  Service: General;  Laterality: N/A;   HAND SURGERY Left    ICD GENERATOR CHANGEOUT N/A 11/04/2019   Procedure: ICD [place];  Surgeon: Deboraha Sprang, MD;  Location: [facility];  Service: Cardiovascular;  Laterality: N/A;   IMPLANTABLE CARDIOVERTER DEFIBRILLATOR GENERATOR CHANGE N/A 12/11/2011   Procedure: IMPLANTABLE CARDIOVERTER DEFIBRILLATOR GENERATOR CHANGE;  Surgeon: Deboraha Sprang, MD;  Location: [facility] CATH LAB;  Service: Cardiovascular;  Laterality: N/A;   KYPHOPLASTY N/A 05/14/2021   Procedure: Lumbar Five Vertebroplasty;  Surgeon: Judith Part, MD;  Location: [facility];  Service: Neurosurgery;  Laterality: N/A;   LUMBAR LAMINECTOMY/ DECOMPRESSION WITH MET-RX N/A 05/14/2021   Procedure: Lumbar Four-Five Minimally Invasive Laminectomy;  Surgeon: Judith Part, MD;  Location: [facility];  Service: Neurosurgery;  Laterality: N/A;  3C/RM 19   TONSILLECTOMY     vt ablation  10/10, 10/13   x 2   WISDOM TOOTH EXTRACTION      Current Outpatient Medications  Medication Sig Dispense Refill   alendronate (FOSAMAX) 70 MG tablet Take 1 tablet (70 mg total) by mouth every 7 (seven) days. Take with a full glass of water on an empty stomach. 13 tablet 3   Ascorbic Acid (VITAMIN C) 1000 MG tablet Take 1,000 mg by mouth at bedtime.     benzonatate (TESSALON) 200 MG capsule Take 1  capsule (200 mg total) by mouth 3 (three) times daily as needed for cough. 30 capsule 1   Calcium-Phosphorus-Vitamin D (CITRACAL +D3 PO) Take 1 tablet by mouth daily.     Cholecalciferol (VITAMIN D3) 50 MCG (2000 UT) TABS Take 2,000 Units by mouth daily.     fluticasone (FLONASE) 50 MCG/ACT nasal spray Place into both nostrils daily.     KLOR-CON M10 10 MEQ tablet TAKE 4 TABLETS BY MOUTH TWICE A DAY 720 tablet 3   magnesium oxide (MAG-OX) 400 (240 Mg) MG tablet TAKE 1 TABLET BY MOUTH TWICE A DAY 180 tablet 2   montelukast  (SINGULAIR) 10 MG tablet TAKE 1 TABLET BY MOUTH EVERY DAY 90 tablet 1   Multiple Vitamin (MULITIVITAMIN WITH MINERALS) TABS Take 1 tablet by mouth daily.     predniSONE (DELTASONE) 10 MG tablet Take 4 pills once daily by mouth for 3 days, then 3 pills daily for 3 days, then 2 pills daily for 3 days then 1 pill daily for 3 days then stop 30 tablet 0   SOTALOL AF 120 MG TABS TAKE 1 & 1/2 TABLETS BY MOUTH TWICE DAILY 270 tablet 2   tadalafil (CIALIS) 5 MG tablet Take 5 mg by mouth daily as needed for erectile dysfunction.     vitamin E 180 MG (400 UNITS) capsule Take 400 Units by mouth at bedtime.     Zinc 30 MG CAPS Take 30 mg by mouth at bedtime.     No current facility-administered medications for this visit.    Allergies:   Bee venom and Lidocaine   Social History: Social History   Socioeconomic History   Marital status: Married    Spouse name: Not on file   Number of children: 0   Years of education: Not on file   Highest education level: Not on file  Occupational History   Occupation: EMT/PARAMEDIC  Tobacco Use   Smoking status: Former    Packs/day: 1.00    Years: 18.00    Pack years: 18.00    Types: Cigarettes    Start date: 01/17/1990    Quit date: 08/19/2007    Years since quitting: 14.3   Smokeless tobacco: Never  Vaping Use   Vaping Use: Never used  Substance and Sexual Activity   Alcohol use: No    Alcohol/week: 0.0 standard drinks   Drug use: No   Sexual activity: Yes  Other Topics Concern   Not on file  Social History Narrative   Works as a Curator Determinants of Radio broadcast assistant Strain: Not on Art therapist Insecurity: Not on file  Transportation Needs: Not on file  Physical Activity: Not on file  Stress: Not on file  Social Connections: Not on file  Intimate Partner Violence: Not on file    Family History: Family History  Problem Relation Age of Onset   Sudden death Father    Asthma Mother    Diabetes Other    Stroke Other     Heart attack Other    Prostate cancer Other    Breast cancer Other    Ovarian cancer Other    Uterine cancer Other    Colon cancer Other    Drug abuse Other    Depression Other    Lung cancer Maternal Grandfather    Heart disease Maternal Grandfather    Heart disease Paternal Grandfather    Hypertension Paternal Grandmother     Review of Systems: All other systems  reviewed and are otherwise negative except as noted above.   Physical Exam: Vitals:   01/07/22 1013  BP: 106/68  Pulse: (!) 50  SpO2: 98%  Weight: 202 lb (91.6 kg)  Height: _0  (1.753 m)     GEN- The patient is well appearing, alert and oriented x 3 today.   HEENT: normocephalic, atraumatic; sclera clear, conjunctiva pink; hearing intact; oropharynx clear; neck supple, no JVP Lymph- no cervical lymphadenopathy Lungs- Clear to ausculation bilaterally, normal work of breathing.  No wheezes, rales, rhonchi Heart- Regular rate and rhythm, no murmurs, rubs or gallops, PMI not laterally displaced GI- soft, non-tender, non-distended, bowel sounds present, no hepatosplenomegaly Extremities- no clubbing or cyanosis. No edema; DP/PT/radial pulses 2+ bilaterally MS- no significant deformity or atrophy Skin- warm and dry, no rash or lesion; ICD pocket well healed Psych- euthymic mood, full affect Neuro- strength and sensation are intact  ICD interrogation- reviewed in detail today,  See PACEART report  EKG:  EKG is ordered today. Personal review of EKG ordered today shows stable QTc on sotalol  Recent Labs: 06/28/2021: Magnesium 2.0 09/17/2021: BUN 8; Creatinine, Ser 0.74; Potassium 4.2; Sodium 140; TSH 0.97 12/11/2021: ALT 36; Hemoglobin 14.0; Platelets 227.0   Wt Readings from Last 3 Encounters:  01/07/22 202 lb (91.6 kg)  01/01/22 202 lb 2 oz (91.7 kg)  12/11/21 200 lb 6 oz (90.9 kg)     Other studies Reviewed: Additional studies/ records that were reviewed today include: Previous EP office notes.    Assessment and Plan:  1.  ARVC s/p Medtronic single chamber ICD  2. VT 3. PVCs euvolemic today Stable on an appropriate medical regimen Normal ICD function See Pace Art report No changes today Continue sotalol.  EKG today shows stable QT on sotalol Will have wear 7 day Zio to re quantify PVCs to see if we need to be more aggressive  3. Edema, intermittent 4. SOB No edema today.  Will update echo for completeness.  Suspect multifactorial.  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   Current medicines are reviewed at length with the patient today.    Labs/ tests ordered today include:  Orders Placed This Encounter  Procedures   Basic metabolic panel   Magnesium   LONG TERM MONITOR (3-14 DAYS)   EKG 12-Lead     Disposition:   Follow up with Dr. Caryl Comes in 6 months sooner pending work up.    Jacalyn Lefevre, PA-C  01/07/2022 11:07 AM  [facility] [address] [place] [address] [phone] (office) [phone] (fax)

## 2021-12-31 ENCOUNTER — Other Ambulatory Visit: Payer: Self-pay

## 2021-12-31 DIAGNOSIS — I429 Cardiomyopathy, unspecified: Secondary | ICD-10-CM

## 2021-12-31 NOTE — Progress Notes (Signed)
Per Oda Kilts, PA-C pt will need echo prior to his 01/07/2022 appt.  Appointment scheduled for 01/07/2022 at [facility] at 8am.  Pt aware and agreeable. ?

## 2022-01-01 ENCOUNTER — Ambulatory Visit (INDEPENDENT_AMBULATORY_CARE_PROVIDER_SITE_OTHER)
Admission: RE | Admit: 2022-01-01 | Discharge: 2022-01-01 | Disposition: A | Discharge status: Home / Self Care | Payer: BC Managed Care – PPO | Source: Ambulatory Visit | Attending: Family Medicine | Admitting: Family Medicine

## 2022-01-01 ENCOUNTER — Encounter: Payer: Self-pay | Admitting: Family Medicine

## 2022-01-01 ENCOUNTER — Ambulatory Visit (INDEPENDENT_AMBULATORY_CARE_PROVIDER_SITE_OTHER): Payer: BC Managed Care – PPO | Admitting: Family Medicine

## 2022-01-01 VITALS — BP 122/70 | HR 57 | Temp 98.2°F | Ht 69.0 in | Wt 202.1 lb

## 2022-01-01 DIAGNOSIS — J309 Allergic rhinitis, unspecified: Secondary | ICD-10-CM

## 2022-01-01 DIAGNOSIS — R052 Subacute cough: Secondary | ICD-10-CM

## 2022-01-01 DIAGNOSIS — R059 Cough, unspecified: Secondary | ICD-10-CM | POA: Insufficient documentation

## 2022-01-01 MED ORDER — BENZONATATE 200 MG PO CAPS: 200.0000 mg | ORAL_CAPSULE | Freq: Three times a day (TID) | ORAL | 1 refills | Status: DC | PRN

## 2022-01-01 MED ORDER — PREDNISONE 10 MG PO TABS: ORAL_TABLET | ORAL | 0 refills | Status: DC

## 2022-01-01 NOTE — Patient Instructions (Signed)
Drink fluids  ?Rest when you can  ?Try tessalon when needed  ? ?Take prednisone 40 mg taper for cough (will also help allergies)  ?It will make you hyper and hungry  ? ?Watch for signs of sinus infection  ? ?Chest xray today  ? ?Update if not starting to improve in a week or if worsening   ? ? ? ?

## 2022-01-01 NOTE — Assessment & Plan Note (Signed)
May be adding to cough syndrome ?Plans to continue zyrtec and flonase  ? ?Unsure if singulair is helping ?

## 2022-01-01 NOTE — Progress Notes (Signed)
? ?Subjective:  ? ? Patient ID: [name], male    DOB: Jun 16, 1976, 46 y.o.   MRN: [id number] ? ?HPI ?Pt presents for cough  ? ?Wt Readings from Last 3 Encounters:  ?01/01/22 202 lb 2 oz (91.7 kg)  ?12/11/21 200 lb 6 oz (90.9 kg)  ?10/24/21 207 lb 6.4 oz (94.1 kg)  ? ?29.85 kg/m? ? ?Bad cough for 3 weeks  ? ?Had a bad stomach bug for a few days to start -end of April  ?Then developed cough/congestion and a head cold  ?That got better ? ?Now cannot shake the cough  ?Is mostly dry  ?Occ a little bit of phlegm / mucous / mostly clear  ? ?Talking and exertion and lying down trigger it  ?When he coughs -feels tight  ?Not sob but a little less exercise tolerance  ? ?Allergies- runny nose  ?Zyrtec ?Flonase  ?Singulair -unsure if he will continue (for allergies)  ? ?Occ headaches but not consistent and they happen when he coughs a lot  ?Some colored mucous from nose/ occ a little blood  ? ?No heartburn  ? ?Patient Active Problem List  ? Diagnosis Date Noted  ? Cough 01/01/2022  ? Localized osteoporosis with current pathological fracture 10/24/2021  ? Osteopenia 06/18/2021  ? Genital warts 06/03/2021  ? Condyloma 06/03/2021  ? 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  ? Other skin changes due to chronic exposure to nonionizing radiation 06/03/2021  ? Lumbar radiculopathy 05/14/2021  ? Stress reaction 03/07/2021  ? 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 Kidney stone 06/13/2019  ? BPH (benign prostatic hyperplasia) 07/27/2018  ? Elevated glucose level 04/06/2017  ? Routine general medical examination at a health care facility 03/28/2016  ? Hypertriglyceridemia 02/21/2014  ? Low HDL (under 40) 02/21/2014  ? Vitamin D deficiency 02/21/2014  ? Allergic rhinitis 02/21/2014  ? Erectile dysfunction 08/19/2013  ? OSA (obstructive sleep apnea) 10/30/2011  ? Environmental allergies 10/27/2011  ? High risk medications (not anticoagulants) long-term use 10/27/2011  ? Hypokalemia 09/29/2011  ? Arrhythmogenic RV  cardiomyopathy 03/04/2011  ? Implantable cardioverter-defibrillator (ICD) in situ 01/10/2011  ? PTSD (post-traumatic stress disorder) 2/2 ICD shocks 01/10/2011  ? Fatty liver 01/04/2009  ? GERD 12/05/2008  ? History of ventricular tachycardia 11/03/2008  ? ECZEMA 08/17/2007  ? ?Past Medical History:  ?Diagnosis Date  ? AICD (automatic cardioverter/defibrillator) present 2007  ? Anxiety   ? Arrhythmogenic RV Cardiomyopathy   ? 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 Asthma when younger  ? Fatty liver   ? History of chicken pox   ? History of kidney stones   ? ICD (implantable cardiac defibrillator) in place   ? Osteopenia   ? Pancreatitis   ? Sleep apnea   ? uses cpap  ? Ventricular tachycardia ([place])   ? sotalol therapy;  catheter ablation at [facility] 10/10 and [facility] 2013  ? ?Past Surgical History:  ?Procedure Laterality Date  ? CARDIAC CATHETERIZATION  06/01/2006  ? CARDIAC DEFIBRILLATOR PLACEMENT  06/03/2006  ? [employer]  ? CHOLECYSTECTOMY N/A 08/01/2021  ? Procedure: LAPAROSCOPIC CHOLECYSTECTOMY WITH INTRAOPERATIVE CHOLANGIOGRAM;  Surgeon: Erroll Luna, MD;  Location: [facility];  Service: General;  Laterality: N/A;  ? HAND SURGERY Left   ? ICD GENERATOR CHANGEOUT N/A 11/04/2019  ? Procedure: ICD GENERATOR CHANGEOUT;  Surgeon: Deboraha Sprang, MD;  Location: [facility];  Service: Cardiovascular;  Laterality: N/A;  ? IMPLANTABLE CARDIOVERTER DEFIBRILLATOR GENERATOR CHANGE N/A 12/11/2011  ? Procedure: IMPLANTABLE CARDIOVERTER DEFIBRILLATOR GENERATOR  CHANGE;  Surgeon: Deboraha Sprang, MD;  Location: [facility] CATH LAB;  Service: Cardiovascular;  Laterality: N/A;  ? KYPHOPLASTY N/A 05/14/2021  ? Procedure: Lumbar Five Vertebroplasty;  Surgeon: Judith Part, MD;  Location: [facility];  Service: Neurosurgery;  Laterality: N/A;  ? LUMBAR LAMINECTOMY/ DECOMPRESSION WITH MET-RX N/A 05/14/2021  ? Procedure: Lumbar Four-Five Minimally Invasive Laminectomy;  Surgeon: Judith Part, MD;  Location: [facility];  Service: Neurosurgery;   Laterality: N/A;  3C/RM 19  ? TONSILLECTOMY    ? vt ablation  10/10, 10/13  ? x 2  ? WISDOM TOOTH EXTRACTION    ? ?Social History  ? ?Tobacco Use  ? Smoking status: Former  ?  Packs/day: 1.00  ?  Years: 18.00  ?  Pack years: 18.00  ?  Types: Cigarettes  ?  Start date: 01/17/1990  ?  Quit date: 08/19/2007  ?  Years since quitting: 14.3  ? Smokeless tobacco: Never  ?Vaping Use  ? Vaping Use: Never used  ?Substance Use Topics  ? Alcohol use: No  ?  Alcohol/week: 0.0 standard drinks  ? Drug use: No  ? ?Family History  ?Problem Relation Age of Onset  ? Sudden death Father   ? Asthma Mother   ? Diabetes Other   ? Stroke Other   ? Heart attack Other   ? Prostate cancer Other   ? Breast cancer Other   ? Ovarian cancer Other   ? Uterine cancer Other   ? Colon cancer Other   ? Drug abuse Other   ? Depression Other   ? Lung cancer Maternal Grandfather   ? Heart disease Maternal Grandfather   ? Heart disease Paternal Grandfather   ? Hypertension Paternal Grandmother   ? ?Allergies  ?Allergen Reactions  ? Bee Venom Anaphylaxis  ? Lidocaine   ?  [name]  ? ?Current Outpatient Medications on File Prior to Visit  ?Medication Sig Dispense Refill  ? alendronate (FOSAMAX) 70 MG tablet Take 1 tablet (70 mg total) by mouth every 7 (seven) days. Take with a full glass of water on an empty stomach. 13 tablet 3  ? Ascorbic Acid (VITAMIN C) 1000 MG tablet Take 1,000 mg by mouth at bedtime.    ? Calcium-Phosphorus-Vitamin D (CITRACAL +D3 PO) Take 1 tablet by mouth daily.    ? Cholecalciferol (VITAMIN D3) 50 MCG (2000 UT) TABS Take 2,000 Units by mouth daily.    ? fluticasone (FLONASE) 50 MCG/ACT nasal spray Place into both nostrils daily.    ? KLOR-CON M10 10 MEQ tablet TAKE 4 TABLETS BY MOUTH TWICE A DAY 720 tablet 3  ? magnesium oxide (MAG-OX) 400 (240 Mg) MG tablet TAKE 1 TABLET BY MOUTH TWICE A DAY 180 tablet 2  ? montelukast (SINGULAIR) 10 MG tablet TAKE 1 TABLET BY MOUTH EVERY DAY 90 tablet 1  ? Multiple Vitamin (MULITIVITAMIN WITH  MINERALS) TABS Take 1 tablet by mouth daily.    ? SOTALOL AF 120 MG TABS TAKE 1 & 1/2 TABLETS BY MOUTH TWICE DAILY 270 tablet 2  ? tadalafil (CIALIS) 5 MG tablet Take 5 mg by mouth daily as needed for erectile dysfunction.    ? vitamin E 180 MG (400 UNITS) capsule Take 400 Units by mouth at bedtime.    ? Zinc 30 MG CAPS Take 30 mg by mouth at bedtime.    ? ?No current facility-administered medications on file prior to visit.  ?  ? ?Review of Systems  ?Constitutional:  Positive  for fatigue. Negative for activity change, appetite change, fever and unexpected weight change.  ?HENT:  Negative for congestion, rhinorrhea, sore throat and trouble swallowing.   ?Eyes:  Negative for pain, redness, itching and visual disturbance.  ?Respiratory:  Positive for cough and wheezing. Negative for apnea, choking, chest tightness, shortness of breath and stridor.   ?Cardiovascular:  Negative for chest pain and palpitations.  ?Gastrointestinal:  Negative for abdominal pain, blood in stool, constipation, diarrhea and nausea.  ?Endocrine: Negative for cold intolerance, heat intolerance, polydipsia and polyuria.  ?Genitourinary:  Negative for difficulty urinating, dysuria, frequency and urgency.  ?Musculoskeletal:  Negative for arthralgias, joint swelling and myalgias.  ?Skin:  Negative for pallor and rash.  ?Neurological:  Positive for headaches. Negative for dizziness, tremors, weakness and numbness.  ?Hematological:  Negative for adenopathy. Does not bruise/bleed easily.  ?Psychiatric/Behavioral:  Negative for decreased concentration and dysphoric mood. The patient is not nervous/anxious.   ?     Some stress ?Sister has been sick with CHF   (genetic) ?  ? ?   ?Objective:  ? Physical Exam ?Constitutional:   ?   General: He is not in acute distress. ?   Appearance: Normal appearance. He is well-developed and normal weight. He is not ill-appearing or diaphoretic.  ?HENT:  ?   Head: Normocephalic and atraumatic.  ?   Comments: No sinus  tenderness ?   Right Ear: Tympanic membrane, ear canal and external ear normal.  ?   Left Ear: Tympanic membrane, ear canal and external ear normal.  ?   Nose:  ?   Comments: Boggy nares  ?   Mouth/Throat:  ?

## 2022-01-01 NOTE — Assessment & Plan Note (Addendum)
Post viral for 3 weeks with some tightness/wheezing  ?Reassuring exam  ?cxr ordered  ? ?Prednisone taper 40 mg ordered  ?Disc poss side effects  ?Tessalon ordered ?Watch for s/s of bacterial sinusitis or pna  ?

## 2022-01-07 ENCOUNTER — Ambulatory Visit (INDEPENDENT_AMBULATORY_CARE_PROVIDER_SITE_OTHER): Payer: BC Managed Care – PPO

## 2022-01-07 ENCOUNTER — Encounter: Payer: Self-pay | Admitting: Student

## 2022-01-07 ENCOUNTER — Ambulatory Visit: Payer: BC Managed Care – PPO | Admitting: Student

## 2022-01-07 ENCOUNTER — Ambulatory Visit (HOSPITAL_COMMUNITY)
Admission: RE | Admit: 2022-01-07 | Discharge: 2022-01-07 | Disposition: A | Discharge status: Home / Self Care | Payer: BC Managed Care – PPO | Source: Ambulatory Visit | Attending: Student | Admitting: Student

## 2022-01-07 VITALS — BP 106/68 | HR 50 | Ht 69.0 in | Wt 202.0 lb

## 2022-01-07 DIAGNOSIS — Z8249 Family history of ischemic heart disease and other diseases of the circulatory system: Secondary | ICD-10-CM | POA: Insufficient documentation

## 2022-01-07 DIAGNOSIS — I428 Other cardiomyopathies: Secondary | ICD-10-CM

## 2022-01-07 DIAGNOSIS — I472 Ventricular tachycardia, unspecified: Secondary | ICD-10-CM

## 2022-01-07 DIAGNOSIS — I429 Cardiomyopathy, unspecified: Secondary | ICD-10-CM | POA: Diagnosis not present

## 2022-01-07 DIAGNOSIS — R008 Other abnormalities of heart beat: Secondary | ICD-10-CM | POA: Insufficient documentation

## 2022-01-07 DIAGNOSIS — R609 Edema, unspecified: Secondary | ICD-10-CM | POA: Diagnosis not present

## 2022-01-07 DIAGNOSIS — Z87891 Personal history of nicotine dependence: Secondary | ICD-10-CM | POA: Diagnosis not present

## 2022-01-07 DIAGNOSIS — Z95810 Presence of automatic (implantable) cardiac defibrillator: Secondary | ICD-10-CM | POA: Diagnosis not present

## 2022-01-07 LAB — ECHOCARDIOGRAM COMPLETE
AR max vel: 1.96 cm2
AV Area VTI: 1.96 cm2
AV Area mean vel: 1.76 cm2
AV Mean grad: 6 mmHg
AV Peak grad: 9.9 mmHg
Ao pk vel: 1.57 m/s
Area-P 1/2: 2.77 cm2
S' Lateral: 4.1 cm

## 2022-01-07 LAB — CUP PACEART INCLINIC DEVICE CHECK
Battery Remaining Longevity: 124 mo
Battery Voltage: 3.03 V
Brady Statistic RV Percent Paced: 0.39 %
Date Time Interrogation Session: 20230523110728
HighPow Impedance: 55 Ohm
HighPow Impedance: 93 Ohm
Implantable Lead Implant Date: 20071018
Implantable Lead Location: 753860
Implantable Lead Model: 6947
Implantable Pulse Generator Implant Date: 20210319
Lead Channel Impedance Value: 342 Ohm
Lead Channel Impedance Value: 418 Ohm
Lead Channel Pacing Threshold Amplitude: 0.875 V
Lead Channel Pacing Threshold Pulse Width: 0.4 ms
Lead Channel Sensing Intrinsic Amplitude: 13.125 mV
Lead Channel Sensing Intrinsic Amplitude: 14 mV
Lead Channel Setting Pacing Amplitude: 2 V
Lead Channel Setting Pacing Pulse Width: 0.4 ms
Lead Channel Setting Sensing Sensitivity: 1.2 mV

## 2022-01-07 LAB — BASIC METABOLIC PANEL
BUN/Creatinine Ratio: 15 (ref 9–20)
BUN: 12 mg/dL (ref 6–24)
CO2: 26 mmol/L (ref 20–29)
Calcium: 10.4 mg/dL — ABNORMAL HIGH (ref 8.7–10.2)
Chloride: 100 mmol/L (ref 96–106)
Creatinine, Ser: 0.78 mg/dL (ref 0.76–1.27)
Glucose: 93 mg/dL (ref 70–99)
Potassium: 4.6 mmol/L (ref 3.5–5.2)
Sodium: 140 mmol/L (ref 134–144)
eGFR: 113 mL/min/{1.73_m2} (ref >59)

## 2022-01-07 LAB — MAGNESIUM: Magnesium: 2.1 mg/dL (ref 1.6–2.3)

## 2022-01-07 NOTE — Patient Instructions (Signed)
Medication Instructions:  Your physician recommends that you continue on your current medications as directed. Please refer to the Current Medication list given to you today.  *If you need a refill on your cardiac medications before your next appointment, please call your pharmacy*   Lab Work: TODAY: BMET, Mag  If you have labs (blood work) drawn today and your tests are completely normal, you will receive your results only by: [place] (if you have MyChart) OR A paper copy in the mail If you have any lab test that is abnormal or we need to change your treatment, we will call you to review the results.   Follow-Up: At [facility], you and your health needs are our priority.  As part of our continuing mission to provide you with exceptional heart care, we have created designated Provider Care Teams.  These Care Teams include your primary Cardiologist (physician) and Advanced Practice Providers (APPs -  Physician Assistants and Nurse Practitioners) who all work together to provide you with the care you need, when you need it.  Your next appointment:   6 month(s)  The format for your next appointment:   In Person  Provider:   [name], MD    Other Instructions  [facility]ulling- Long Term Monitor Instructions  Your physician has requested you wear a ZIO patch monitor for 7 days.  This is a single patch monitor. [employer] supplies one patch monitor per enrollment. Additional stickers are not available. Please do not apply patch if you will be having a Nuclear Stress Test,  Echocardiogram, Cardiac CT, MRI, or Chest Xray during the period you would be wearing the  monitor. The patch cannot be worn during these tests. You cannot remove and re-apply the  ZIO XT patch monitor.  Your ZIO patch monitor will be mailed 3 day USPS to your address on file. It may take 3-5 days  to receive your monitor after you have been enrolled.  Once you have received your monitor, please review the  enclosed instructions. Your monitor  has already been registered assigning a specific monitor serial # to you.  Billing and Patient Assistance Program Information  We have supplied [employer] with any of your insurance information on file for billing purposes. [employer] offers a sliding scale Patient Assistance Program for patients that do not have  insurance, or whose insurance does not completely cover the cost of the ZIO monitor.  You must apply for the Patient Assistance Program to qualify for this discounted rate.  To apply, please call [employer] at [phone], select option 4, select option 2, ask to apply for  Patient Assistance Program. Theodore Demark will ask your household income, and how many people  are in your household. They will quote your out-of-pocket cost based on that information.  [employer] will also be able to set up a 42-month interest-free payment plan if needed.  Applying the monitor   Shave hair from upper left chest.  Hold abrader disc by orange tab. Rub abrader in 40 strokes over the upper left chest as  indicated in your monitor instructions.  Clean area with 4 enclosed alcohol pads. Let dry.  Apply patch as indicated in monitor instructions. Patch will be placed under collarbone on left  side of chest with arrow pointing upward.  Rub patch adhesive wings for 2 minutes. Remove white label marked "1". Remove the white  label marked "2". Rub patch adhesive wings for 2 additional minutes.  While looking in a mirror, press and release  button in center of patch. A small green light will  flash 3-4 times. This will be your only indicator that the monitor has been turned on.  Do not shower for the first 24 hours. You may shower after the first 24 hours.  Press the button if you feel a symptom. You will hear a small click. Record Date, Time and  Symptom in the Patient Logbook.  When you are ready to remove the patch, follow instructions on the last 2 pages of Patient  Logbook.  Stick patch monitor onto the last page of Patient Logbook.  Place Patient Logbook in the blue and white box. Use locking tab on box and tape box closed  securely. The blue and white box has prepaid postage on it. Please place it in the mailbox as  soon as possible. Your physician should have your test results approximately 7 days after the  monitor has been mailed back to [facility].  Call [facility] at [phone] if you have questions regarding  your ZIO XT patch monitor. Call them immediately if you see an orange light blinking on your  monitor.  If your monitor falls off in less than 4 days, contact our Monitor department at [phone].  If your monitor becomes loose or falls off after 4 days call [employer] at [phone] for  suggestions on securing your monitor

## 2022-01-07 NOTE — Progress Notes (Unsigned)
[id number] zio xt from office inventory applied to patient.  Dr. Caryl Comes to read.

## 2022-01-07 NOTE — Progress Notes (Signed)
  Echocardiogram 2D Echocardiogram has been performed.  [name] F 01/07/2022, 8:53 AM

## 2022-01-23 ENCOUNTER — Ambulatory Visit: Payer: BC Managed Care – PPO | Admitting: Family Medicine

## 2022-01-23 ENCOUNTER — Encounter: Payer: Self-pay | Admitting: Family Medicine

## 2022-01-23 ENCOUNTER — Ambulatory Visit (INDEPENDENT_AMBULATORY_CARE_PROVIDER_SITE_OTHER)
Admission: RE | Admit: 2022-01-23 | Discharge: 2022-01-23 | Disposition: A | Discharge status: Home / Self Care | Payer: BC Managed Care – PPO | Source: Ambulatory Visit | Attending: Family Medicine | Admitting: Family Medicine

## 2022-01-23 VITALS — BP 130/80 | HR 60 | Temp 98.3°F | Ht 69.0 in | Wt 205.0 lb

## 2022-01-23 DIAGNOSIS — K219 Gastro-esophageal reflux disease without esophagitis: Secondary | ICD-10-CM

## 2022-01-23 DIAGNOSIS — R202 Paresthesia of skin: Secondary | ICD-10-CM

## 2022-01-23 DIAGNOSIS — R052 Subacute cough: Secondary | ICD-10-CM

## 2022-01-23 NOTE — Patient Instructions (Signed)
Let's monitor the cough  If it does not improve we can try some pepcid Keep me posted   I think your neck is causing your tingling in the arm  Discs, degenerative change or even muscle spasms   Let's check a CS film today  We will notify you with results

## 2022-01-23 NOTE — Assessment & Plan Note (Signed)
Pt has some tingling in L arm and first 2 fingers with cough occ and with movement of neck  No pain Exam is reassuring  H/o disc dz in LS  Xray ordered Will monitor  Watch for weakness/pain or worse paresthesia

## 2022-01-23 NOTE — Assessment & Plan Note (Signed)
Pt denies problems right now  If his cough does not continue to improve would consider emp trial of pepcid

## 2022-01-23 NOTE — Progress Notes (Signed)
Subjective:    Patient ID: [name], male    DOB: [birth year], 46 y.o.   MRN: [id number]  HPI Pt presents for f/u of cough   Wt Readings from Last 3 Encounters:  01/23/22 205 lb (93 kg)  01/07/22 202 lb (91.6 kg)  01/01/22 202 lb 2 oz (91.7 kg)   30.27 kg/m  Still coughing  Just starting to get better /slightly  Dry for the most part  No wheezing   Throat is fine  Allergies have not been bad (even with recent air quality issues)   H/o GERD Not heartburn  Has more loose stools lately   Clears throat occ    Has noted that when he coughs -gets shooting pain in L arm and thumb/index finger  Neck cracked at work and it happened also  Is strange  No loss of strength  No pain but neck feels tense      Seen 5/17 for ongoing cough after uri  Noted nasal allergies and sinus issues  CXR : Narrative & Impression  CLINICAL DATA:  Persistent cough for several weeks   EXAM: CHEST - 2 VIEW   COMPARISON:  12/11/2011   FINDINGS: Cardiac shadow is stable. Defibrillator is again noted. Lungs are well aerated bilaterally. No focal infiltrate effusion is seen. No bony abnormality is noted.   IMPRESSION: No active cardiopulmonary disease.     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[place]) 09/14/2019   Kidney stone 06/13/2019   BPH (benign prostatic hyperplasia) 07/27/2018   Elevated glucose level 04/06/2017   Routine general medical examination at a health care facility  03/28/2016   Hypertriglyceridemia 02/21/2014   Low HDL (under 40) 02/21/2014   Vitamin D deficiency 02/21/2014   Allergic rhinitis 02/21/2014   Erectile dysfunction 08/19/2013   OSA (obstructive sleep apnea) 10/30/2011   Environmental allergies 10/27/2011   High risk medications (not anticoagulants) long-term use 10/27/2011   Hypokalemia 09/29/2011   Arrhythmogenic RV cardiomyopathy 03/04/2011   Implantable cardioverter-defibrillator (ICD) in situ 01/10/2011   PTSD (post-traumatic stress disorder) 2/2 ICD shocks 01/10/2011   Fatty liver 01/04/2009   GERD 12/05/2008   History of ventricular tachycardia 11/03/2008   ECZEMA 08/17/2007   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   Asthma when younger   Fatty liver    History of chicken pox    History of kidney stones    ICD (implantable cardiac defibrillator) in place    Osteopenia    Pancreatitis    Sleep apnea    uses cpap   Ventricular tachycardia (HCC)    sotalol therapy;  catheter ablation at [facility] 10/10 and [facility] 2013   Past Surgical History:  Procedure Laterality Date   CARDIAC CATHETERIZATION  06/01/2006   CARDIAC DEFIBRILLATOR PLACEMENT  06/03/2006   Medtronic   CHOLECYSTECTOMY N/A 08/01/2021   Procedure: LAPAROSCOPIC CHOLECYSTECTOMY WITH INTRAOPERATIVE CHOLANGIOGRAM;  Surgeon: Erroll Luna, MD;  Location: [facility];  Service: General;  Laterality: N/A;   HAND SURGERY Left    ICD GENERATOR CHANGEOUT N/A 11/04/2019   Procedure: ICD GENERATOR CHANGEOUT;  Surgeon: Deboraha Sprang, MD;  Location: [facility];  Service: Cardiovascular;  Laterality: N/A;   IMPLANTABLE CARDIOVERTER DEFIBRILLATOR GENERATOR CHANGE N/A 12/11/2011   Procedure: IMPLANTABLE CARDIOVERTER DEFIBRILLATOR GENERATOR CHANGE;  Surgeon: Deboraha Sprang, MD;  Location: [facility] C[facility];  Service: Cardiovascular;   Laterality: N/A;   KYPHOPLASTY N/A 05/14/2021   Procedure: Lumbar Five Vertebroplasty;  Surgeon: Judith Part, MD;  Location: [place];  Service: Neurosurgery;  Laterality: N/A;   LUMBAR LAMINECTOMY/ DECOMPRESSION WITH MET-RX N/A 05/14/2021   Procedure: Lumbar Four-Five Minimally Invasive Laminectomy;  Surgeon: Judith Part, MD;  Location: [place];  Service: Neurosurgery;  Laterality: N/A;  3C/RM 19   TONSILLECTOMY     vt ablation  10/10, 10/13   x 2   WISDOM TOOTH EXTRACTION     Social History   Tobacco Use   Smoking status: Former    Packs/day: 1.00    Years: 18.00    Total pack years: 18.00    Types: Cigarettes    Start date: 01/17/1990    Quit date: 08/19/2007    Years since quitting: 14.4   Smokeless tobacco: Never  Vaping Use   Vaping Use: Never used  Substance Use Topics   Alcohol use: No    Alcohol/week: 0.0 standard drinks of alcohol   Drug use: No   Family History  Problem Relation Age of Onset   Sudden death Father    Asthma Mother    Diabetes Other    Stroke Other    Heart attack Other    Prostate cancer Other    Breast cancer Other    Ovarian cancer Other    Uterine cancer Other    Colon cancer Other    Drug abuse Other    Depression Other    Lung cancer Maternal Grandfather    Heart disease Maternal Grandfather    Heart disease Paternal Grandfather    Hypertension Paternal Grandmother    Allergies  Allergen Reactions   Bee Venom Anaphylaxis   Lidocaine     [name]   Current Outpatient Medications on File Prior to Visit  Medication Sig Dispense Refill   alendronate (FOSAMAX) 70 MG tablet Take 1 tablet (70 mg total) by mouth every 7 (seven) days. Take with a full glass of water on an empty stomach. 13 tablet 3   Ascorbic Acid (VITAMIN C) 1000 MG tablet Take 1,000 mg by mouth at bedtime.     benzonatate (TESSALON) 200 MG capsule Take 1 capsule (200 mg total) by mouth 3 (three) times daily as needed for cough. 30 capsule 1    Calcium-Phosphorus-Vitamin D (CITRACAL +D3 PO) Take 1 tablet by mouth daily.     Cholecalciferol (VITAMIN D3) 50 MCG (2000 UT) TABS Take 2,000 Units by mouth daily.     fluticasone (FLONASE) 50 MCG/ACT nasal spray Place into both nostrils daily.     KLOR-CON M10 10 MEQ tablet TAKE 4 TABLETS BY MOUTH TWICE A DAY 720 tablet 3   magnesium oxide (MAG-OX) 400 (240 Mg) MG tablet TAKE 1 TABLET BY MOUTH TWICE A DAY 180 tablet 2   montelukast (SINGULAIR) 10 MG tablet TAKE 1 TABLET BY MOUTH EVERY DAY 90 tablet 1   Multiple Vitamin (MULITIVITAMIN WITH MINERALS) TABS Take 1 tablet by mouth daily.  SOTALOL AF 120 MG TABS TAKE 1 & 1/2 TABLETS BY MOUTH TWICE DAILY 270 tablet 2   tadalafil (CIALIS) 5 MG tablet Take 5 mg by mouth daily as needed for erectile dysfunction.     vitamin E 180 MG (400 UNITS) capsule Take 400 Units by mouth at bedtime.     Zinc 30 MG CAPS Take 30 mg by mouth at bedtime.     No current facility-administered medications on file prior to visit.    Review of Systems  Constitutional:  Negative for activity change, appetite change, fatigue, fever and unexpected weight change.  HENT:  Negative for congestion, rhinorrhea, sore throat and trouble swallowing.   Eyes:  Negative for pain, redness, itching and visual disturbance.  Respiratory:  Positive for cough. Negative for chest tightness, shortness of breath and wheezing.   Cardiovascular:  Negative for chest pain and palpitations.  Gastrointestinal:  Negative for abdominal pain, blood in stool, constipation, diarrhea and nausea.  Endocrine: Negative for cold intolerance, heat intolerance, polydipsia and polyuria.  Genitourinary:  Negative for difficulty urinating, dysuria, frequency and urgency.  Musculoskeletal:  Negative for arthralgias, joint swelling and myalgias.  Skin:  Negative for pallor and rash.  Neurological:  Positive for numbness. Negative for dizziness, tremors, weakness and headaches.  Hematological:  Negative for  adenopathy. Does not bruise/bleed easily.  Psychiatric/Behavioral:  Negative for decreased concentration and dysphoric mood. The patient is not nervous/anxious.        Objective:   Physical Exam Constitutional:      General: He is not in acute distress.    Appearance: Normal appearance. He is well-developed and normal weight. He is not ill-appearing or diaphoretic.  HENT:     Head: Normocephalic and atraumatic.  Eyes:     Conjunctiva/sclera: Conjunctivae normal.     Pupils: Pupils are equal, round, and reactive to light.  Neck:     Thyroid: No thyromegaly.     Vascular: No carotid bruit or JVD.     Comments: Nl rom CS Flex and lateral movement cause some tingling in L arm and hand   No tenderness Nl rom  Mild crepitus    Cardiovascular:     Rate and Rhythm: Normal rate and regular rhythm.     Heart sounds: Normal heart sounds.     No gallop.  Pulmonary:     Effort: Pulmonary effort is normal. No respiratory distress.     Breath sounds: Normal breath sounds. No stridor. No wheezing, rhonchi or rales.  Chest:     Chest wall: No tenderness.  Abdominal:     General: There is no distension or abdominal bruit.     Palpations: Abdomen is soft.  Musculoskeletal:     Cervical back: Normal range of motion and neck supple.     Right lower leg: No edema.     Left lower leg: No edema.  Lymphadenopathy:     Cervical: No cervical adenopathy.  Skin:    General: Skin is warm and dry.     Coloration: Skin is not pale.     Findings: No rash.  Neurological:     Mental Status: He is alert.     Cranial Nerves: No cranial nerve deficit.     Motor: No weakness.     Coordination: Coordination normal.     Gait: Gait normal.     Deep Tendon Reflexes: Reflexes are normal and symmetric. Reflexes normal.     Comments: Grip is normal     Psychiatric:  Mood and Affect: Mood normal.           Assessment & Plan:   Problem List Items Addressed This Visit       Digestive    GERD    Pt denies problems right now  If his cough does not continue to improve would consider emp trial of pepcid         Other   Cough - Primary    Very slowly improving  Prednisone helped  cxr clear  occ cough fit/ always dry  Not wheezing Allergies are ok  No GERD symptoms   Will monitor If this does not continue to improve consider trial of pepcid       Paresthesia of left arm    Pt has some tingling in L arm and first 2 fingers with cough occ and with movement of neck  No pain Exam is reassuring  H/o disc dz in LS  Xray ordered Will monitor  Watch for weakness/pain or worse paresthesia       Relevant Orders   DG Cervical Spine Complete

## 2022-01-23 NOTE — Assessment & Plan Note (Signed)
Very slowly improving  Prednisone helped  cxr clear  occ cough fit/ always dry  Not wheezing Allergies are ok  No GERD symptoms   Will monitor If this does not continue to improve consider trial of pepcid

## 2022-01-24 ENCOUNTER — Encounter: Payer: Self-pay | Admitting: Family Medicine

## 2022-02-03 ENCOUNTER — Ambulatory Visit (INDEPENDENT_AMBULATORY_CARE_PROVIDER_SITE_OTHER): Payer: BC Managed Care – PPO

## 2022-02-03 ENCOUNTER — Ambulatory Visit: Payer: BC Managed Care – PPO | Admitting: Pulmonary Disease

## 2022-02-03 ENCOUNTER — Encounter: Payer: Self-pay | Admitting: Pulmonary Disease

## 2022-02-03 VITALS — BP 112/72 | HR 53 | Temp 98.0°F | Ht 69.0 in | Wt 205.2 lb

## 2022-02-03 DIAGNOSIS — G4733 Obstructive sleep apnea (adult) (pediatric): Secondary | ICD-10-CM | POA: Diagnosis not present

## 2022-02-03 DIAGNOSIS — I472 Ventricular tachycardia, unspecified: Secondary | ICD-10-CM | POA: Diagnosis not present

## 2022-02-03 NOTE — Patient Instructions (Signed)
Follow up in 1 year.

## 2022-02-03 NOTE — Progress Notes (Signed)
[facility]  Chief Complaint  Patient presents with   Follow-up    Follow up. Patient has no complaints.     Past Surgical History:  He  has a past surgical history that includes Cardiac catheterization (06/01/2006); Cardiac defibrillator placement (06/03/2006); vt ablation (10/10, 10/13); implantable cardioverter defibrillator generator change (N/A, 12/11/2011); ICD GENERATOR CHANGEOUT (N/A, 11/04/2019); Lumbar laminectomy/ decompression with met-rx (N/A, 05/14/2021); Kyphoplasty (N/A, 05/14/2021); Hand surgery (Left); Tonsillectomy; Wisdom tooth extraction; and Cholecystectomy (N/A, 08/01/2021).  Past Medical History:  Anxiety, s/p AICD, Asthma, VT  Constitutional:  BP 112/72 (BP Location: Right Arm, Patient Position: Sitting, Cuff Size: Normal)   Pulse (!) 53   Temp 98 F (36.7 C) (Oral)   Ht _0  (1.753 m)   Wt 205 lb 3.2 oz (93.1 kg)   SpO2 98%   BMI 30.30 kg/m   Brief Summary:  [name] is a 46 y.o. male with obstructive sleep apnea.  He works as a Audiological scientist.      Subjective:   I last saw him in 2021.  Uses CPAP on regular basis expect when he does his 24 hour shifts at work.  No issues with mask fit.  Not having sinus congestion or sore throat.   Physical Exam:   Appearance - well kempt   ENMT - no sinus tenderness, no oral exudate, no LAN, Mallampati 3 airway, no stridor  Respiratory - equal breath sounds bilaterally, no wheezing or rales  CV - s1s2 regular rate and rhythm, no murmurs  Ext - no clubbing, no edema  Skin - no rashes  Psych - normal mood and affect   Sleep Tests:  PSG 12/03/11 >> AHI 22, SpO2 low 89% Auto CPAP 01/01/22 to 01/30/22 >> used on 26 of 30 nights with average 6 hrs 46 min.  Average AHI 3.4 with CPAP 7 cm H2O  Cardiac Tests:  Echo 01/07/22 >> EF 60 to 65%, regional hypokinesis  Social History:  He  reports that he quit smoking about 14 years ago. His smoking use included  cigarettes. He started smoking about 32 years ago. He has a 18.00 pack-year smoking history. He has never used smokeless tobacco. He reports that he does not drink alcohol and does not use drugs.  Family History:  His family history includes Asthma in his mother; Breast cancer in an other family member; Colon cancer in an other family member; Depression in an other family member; Diabetes in an other family member; Drug abuse in an other family member; Heart attack in an other family member; Heart disease in his maternal grandfather and paternal grandfather; Hypertension in his paternal grandmother; Lung cancer in his maternal grandfather; Ovarian cancer in an other family member; Prostate cancer in an other family member; Stroke in an other family member; Sudden death in his father; Uterine cancer in an other family member.     Assessment/Plan:   Obstructive sleep apnea. - he is compliant with CPAP and reports benefit from therapy - he uses [employer] for his DME - his current CPAP was ordered on 11/09/18 - continue CPAP 7 cm H2O  Ventricular tachycardia s/p AICD. - followed by Dr. [name] with cardiology  Time Spent Involved in Patient Care on Day of Examination:  16 minutes  Follow up:   Patient Instructions  Follow up in 1 year  Medication List:   Allergies as of 02/03/2022       Reactions   Bee Venom Anaphylaxis  Lidocaine    [name]        Medication List        Accurate as of February 03, 2022 12:23 PM. If you have any questions, ask your nurse or doctor.          alendronate 70 MG tablet Commonly known as: FOSAMAX Take 1 tablet (70 mg total) by mouth every 7 (seven) days. Take with a full glass of water on an empty stomach.   benzonatate 200 MG capsule Commonly known as: TESSALON Take 1 capsule (200 mg total) by mouth 3 (three) times daily as needed for cough.   CITRACAL +D3 PO Take 1 tablet by mouth daily.   fluticasone 50 MCG/ACT nasal  spray Commonly known as: FLONASE Place into both nostrils daily.   Klor-Con M10 10 MEQ tablet Generic drug: potassium chloride TAKE 4 TABLETS BY MOUTH TWICE A DAY   magnesium oxide 400 (240 Mg) MG tablet Commonly known as: MAG-OX TAKE 1 TABLET BY MOUTH TWICE A DAY   montelukast 10 MG tablet Commonly known as: SINGULAIR TAKE 1 TABLET BY MOUTH EVERY DAY   multivitamin with minerals Tabs tablet Take 1 tablet by mouth daily.   SOTALOL AF 120 MG Tabs TAKE 1 & 1/2 TABLETS BY MOUTH TWICE DAILY   tadalafil 5 MG tablet Commonly known as: CIALIS Take 5 mg by mouth daily as needed for erectile dysfunction.   vitamin C 1000 MG tablet Take 1,000 mg by mouth at bedtime.   Vitamin D3 50 MCG (2000 UT) Tabs Take 2,000 Units by mouth daily.   vitamin E 180 MG (400 UNITS) capsule Take 400 Units by mouth at bedtime.   Zinc 30 MG Caps Take 30 mg by mouth at bedtime.        Signature:  Chesley Mires, MD [place] Pager - [phone] 02/03/2022, 12:23 PM

## 2022-02-05 LAB — CUP PACEART REMOTE DEVICE CHECK
Battery Remaining Longevity: 123 mo
Battery Voltage: 3.03 V
Brady Statistic RV Percent Paced: 0.49 %
Date Time Interrogation Session: 20230619022602
HighPow Impedance: 53 Ohm
HighPow Impedance: 92 Ohm
Implantable Lead Implant Date: 20071018
Implantable Lead Location: 753860
Implantable Lead Model: 6947
Implantable Pulse Generator Implant Date: 20210319
Lead Channel Impedance Value: 342 Ohm
Lead Channel Impedance Value: 418 Ohm
Lead Channel Pacing Threshold Amplitude: 1 V
Lead Channel Pacing Threshold Pulse Width: 0.4 ms
Lead Channel Sensing Intrinsic Amplitude: 13.125 mV
Lead Channel Sensing Intrinsic Amplitude: 13.125 mV
Lead Channel Setting Pacing Amplitude: 2 V
Lead Channel Setting Pacing Pulse Width: 0.4 ms
Lead Channel Setting Sensing Sensitivity: 1.2 mV

## 2022-02-24 NOTE — Progress Notes (Signed)
Remote ICD transmission.   

## 2022-03-09 ENCOUNTER — Other Ambulatory Visit: Payer: Self-pay | Admitting: Family Medicine

## 2022-04-11 ENCOUNTER — Other Ambulatory Visit: Payer: Self-pay | Admitting: Internal Medicine

## 2022-04-15 ENCOUNTER — Other Ambulatory Visit: Payer: Self-pay

## 2022-04-15 MED ORDER — BISOPROLOL FUMARATE 5 MG PO TABS: 5.0000 mg | ORAL_TABLET | Freq: Every day | ORAL | 3 refills | Status: DC

## 2022-04-29 NOTE — Progress Notes (Unsigned)
Electrophysiology Office Note Date: 04/30/2022  ID:  [name], DOB [birth year], MRN [id number]  PCP: Abner Greenspan, MD Primary Cardiologist: [name], MD Electrophysiologist: [name], MD   CC: Routine ICD follow-up  [name] is a 46 y.o. male seen today for [name], MD for routine follow up and to discuss symptoms s/p starting bisoprolol.   Since last being seen in our clinic the patient reports doing about the same. Main complaint is fatigue. 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   Also does one 12 hr each week, will occasionally end up with 2 12s each week.  His fatigue is non-specific. For example, he got off shift Monday am, and still feels drained. No symptoms walking in to the clinic, or with changing clothes/bathing.  He does get winded occasionally playing with his "high energy" 44 year old.   Can mow his small yard, but then is mostly wiped out for the rest of the day.   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   Asthma when younger   Fatty liver    History of chicken pox    History of kidney stones    ICD (implantable cardiac defibrillator) in place    Osteopenia    Pancreatitis    Sleep apnea    uses cpap   Ventricular tachycardia (HCC)    sotalol therapy;  catheter ablation at [name] 10/10 and [facility] 2013   Past Surgical History:  Procedure Laterality Date   CARDIAC CATHETERIZATION  06/01/2006   CARDIAC DEFIBRILLATOR PLACEMENT  06/03/2006   Medtronic   CHOLECYSTECTOMY N/A 08/01/2021   Procedure: LAPAROSCOPIC CHOLECYSTECTOMY WITH INTRAOPERATIVE CHOLANGIOGRAM;  Surgeon: Erroll Luna, MD;  Location: [facility];  Service: General;  Laterality: N/A;   HAND  SURGERY Left    ICD GENERATOR CHANGEOUT N/A 11/04/2019   Procedure: ICD [place];  Surgeon: Deboraha Sprang, MD;  Location: [place] [facility];  Service: Cardiovascular;  Laterality: N/A;   IMPLANTABLE CARDIOVERTER DEFIBRILLATOR GENERATOR CHANGE N/A 12/11/2011   Procedure: IMPLANTABLE CARDIOVERTER DEFIBRILLATOR GENERATOR CHANGE;  Surgeon: Deboraha Sprang, MD;  Location: [facility] CATH [facility];  Service: Cardiovascular;  Laterality: N/A;   KYPHOPLASTY N/A 05/14/2021   Procedure: Lumbar Five Vertebroplasty;  Surgeon: Judith Part, MD;  Location: [place];  Service: Neurosurgery;  Laterality: N/A;   LUMBAR LAMINECTOMY/ DECOMPRESSION WITH MET-RX N/A 05/14/2021   Procedure: Lumbar Four-Five Minimally Invasive Laminectomy;  Surgeon: Judith Part, MD;  Location: [place];  Service: Neurosurgery;  Laterality: N/A;  3C/RM 19   TONSILLECTOMY     vt ablation  10/10, 10/13   x 2   WISDOM TOOTH EXTRACTION      Current Outpatient Medications  Medication Sig Dispense Refill   alendronate (FOSAMAX) 70 MG tablet Take 1 tablet (70 mg total) by mouth every 7 (seven) days. Take with a full glass of water on an empty stomach. 13 tablet 3   Ascorbic Acid (VITAMIN C) 1000 MG tablet Take 1,000 mg by mouth at bedtime.     benzonatate (TESSALON) 200 MG capsule Take 1 capsule (200 mg total) by mouth 3 (three) times daily as needed for cough. 30 capsule 1  bisoprolol (ZEBETA) 5 MG tablet Take 1 tablet (5 mg total) by mouth daily. 90 tablet 3   Calcium-Phosphorus-Vitamin D (CITRACAL +D3 PO) Take 1 tablet by mouth daily.     Cholecalciferol (VITAMIN D3) 50 MCG (2000 UT) TABS Take 2,000 Units by mouth daily.     fluticasone (FLONASE) 50 MCG/ACT nasal spray Place into both nostrils daily.     KLOR-CON M10 10 MEQ tablet TAKE 4 TABLETS BY MOUTH TWICE A DAY 720 tablet 3   magnesium oxide (MAG-OX) 400 (240 Mg) MG tablet TAKE 1 TABLET BY MOUTH TWICE A DAY 180 tablet 2   montelukast (SINGULAIR) 10 MG tablet TAKE 1  TABLET BY MOUTH EVERY DAY 90 tablet 1   Multiple Vitamin (MULITIVITAMIN WITH MINERALS) TABS Take 1 tablet by mouth daily.     SOTALOL AF 120 MG TABS TAKE 1 & 1/2 TABLETS BY MOUTH TWICE DAILY 270 tablet 2   tadalafil (CIALIS) 5 MG tablet Take 5 mg by mouth daily as needed for erectile dysfunction.     vitamin E 180 MG (400 UNITS) capsule Take 400 Units by mouth at bedtime.     Zinc 30 MG CAPS Take 30 mg by mouth at bedtime.     No current facility-administered medications for this visit.    Allergies:   Bee venom and Lidocaine   Social History: Social History   Socioeconomic History   Marital status: Married    Spouse name: Not on file   Number of children: 0   Years of education: Not on file   Highest education level: Not on file  Occupational History   Occupation: EMT/PARAMEDIC  Tobacco Use   Smoking status: Former    Packs/day: 1.00    Years: 18.00    Total pack years: 18.00    Types: Cigarettes    Start date: 01/17/1990    Quit date: 08/19/2007    Years since quitting: 14.7   Smokeless tobacco: Never  Vaping Use   Vaping Use: Never used  Substance and Sexual Activity   Alcohol use: No    Alcohol/week: 0.0 standard drinks of alcohol   Drug use: No   Sexual activity: Yes  Other Topics Concern   Not on file  Social History Narrative   Works as a Curator Determinants of Radio broadcast assistant Strain: Not on Art therapist Insecurity: Not on file  Transportation Needs: Not on file  Physical Activity: Not on file  Stress: Not on file  Social Connections: Not on file  Intimate Partner Violence: Not on file   Family History: Family History  Problem Relation Age of Onset   Sudden death Father    Asthma Mother    Diabetes Other    Stroke Other    Heart attack Other    Prostate cancer Other    Breast cancer Other    Ovarian cancer Other    Uterine cancer Other    Colon cancer Other    Drug abuse Other    Depression Other    Lung cancer Maternal  Grandfather    Heart disease Maternal Grandfather    Heart disease Paternal Grandfather    Hypertension Paternal Grandmother    Review of Systems: Review of systems complete and found to be negative unless listed in HPI.    Physical Exam: Vitals:   04/30/22 0951  BP: 114/78  Pulse: 62  SpO2: 98%  Weight: 210 lb (95.3 kg)  Height: '5\' 9"'  (1.753 m)  General: Pleasant, NAD. No resp difficulty Psych: Normal affect. HEENT:  Normal, without mass or lesion.         Neck: Supple, no bruits or JVD. Carotids 2+. No lymphadenopathy/thyromegaly appreciated. Heart: PMI nondisplaced. RRR no s3, s4, or murmurs. Lungs:  Resp regular and unlabored, CTA. Abdomen: Soft, non-tender, non-distended, No HSM, BS + x 4.   Extremities: No clubbing, cyanosis or edema. DP/PT/Radials 2+ and equal bilaterally. Neuro: Alert and oriented X 3. Moves all extremities spontaneously.   ICD interrogation- not checked today. Here to discuss monitor results.   EKG:  EKG is not ordered today.  Recent Labs: 09/17/2021: TSH 0.97 12/11/2021: ALT 36; Hemoglobin 14.0; Platelets 227.0 01/07/2022: BUN 12; Creatinine, Ser 0.78; Magnesium 2.1; Potassium 4.6; Sodium 140   Wt Readings from Last 3 Encounters:  04/30/22 210 lb (95.3 kg)  02/03/22 205 lb 3.2 oz (93.1 kg)  01/23/22 205 lb (93 kg)     Other studies Reviewed: Additional studies/ records that were reviewed today include: Previous EP office notes.   Assessment and Plan:  1.  ARVC s/p Medtronic single chamber ICD  2. VT 3. PVCs Volume status stable on exam today.  Stable on an appropriate medical regimen Normal ICD function last check, due for remote next week.  No changes today Continue sotalol.  Continue bisoprolol 5 mg daily. He does feel less PVCs on this.  7 day Zio with 12% PVCs 02/2022, bisoprolol added. Symptomatic improved.   Current medicines are reviewed at length with the patient today.    Labs/ tests ordered today include:  No orders of the  defined types were placed in this encounter.   Disposition:   Follow up with Dr. Caryl Comes in 6 months . Will re-quantify PVCs prior to that appt with 7 day Zio.    Jacalyn Lefevre, PA-C  04/30/2022 9:56 AM  [facility] [address] [place] [phone] (office) [phone] (fax)

## 2022-04-30 ENCOUNTER — Ambulatory Visit (INDEPENDENT_AMBULATORY_CARE_PROVIDER_SITE_OTHER): Payer: BC Managed Care – PPO

## 2022-04-30 ENCOUNTER — Encounter: Payer: Self-pay | Admitting: Student

## 2022-04-30 ENCOUNTER — Ambulatory Visit: Payer: BC Managed Care – PPO | Attending: Student | Admitting: Student

## 2022-04-30 VITALS — BP 114/78 | HR 62 | Ht 69.0 in | Wt 210.0 lb

## 2022-04-30 DIAGNOSIS — I428 Other cardiomyopathies: Secondary | ICD-10-CM | POA: Diagnosis not present

## 2022-04-30 DIAGNOSIS — I472 Ventricular tachycardia, unspecified: Secondary | ICD-10-CM

## 2022-04-30 DIAGNOSIS — Z95810 Presence of automatic (implantable) cardiac defibrillator: Secondary | ICD-10-CM | POA: Diagnosis not present

## 2022-04-30 NOTE — Patient Instructions (Signed)
Medication Instructions:  Your physician recommends that you continue on your current medications as directed. Please refer to the Current Medication list given to you today.  *If you need a refill on your cardiac medications before your next appointment, please call your pharmacy*   Lab Work: None If you have labs (blood work) drawn today and your tests are completely normal, you will receive your results only by: [place] (if you have MyChart) OR A paper copy in the mail If you have any lab test that is abnormal or we need to change your treatment, we will call you to review the results.   Follow-Up: At [facility], you and your health needs are our priority.  As part of our continuing mission to provide you with exceptional heart care, we have created designated Provider Care Teams.  These Care Teams include your primary Cardiologist (physician) and Advanced Practice Providers (APPs -  Physician Assistants and Nurse Practitioners) who all work together to provide you with the care you need, when you need it.   Your next appointment:   6 month(s)  The format for your next appointment:   In Person  Provider:   [name], MD   Other Instructions  [facility] [place]- Long Term Monitor Instructions  Your physician has requested you wear a ZIO patch monitor for 7 days prior to your appointment with Dr. Caryl Comes in 6 months.  This is a single patch monitor. [employer] supplies one patch monitor per enrollment. Additional stickers are not available. Please do not apply patch if you will be having a Nuclear Stress Test,  Echocardiogram, Cardiac CT, MRI, or Chest Xray during the period you would be wearing the  monitor. The patch cannot be worn during these tests. You cannot remove and re-apply the  ZIO XT patch monitor.  Your ZIO patch monitor will be mailed 3 day USPS to your address on file. It may take 3-5 days  to receive your monitor after you have been enrolled.  Once you  have received your monitor, please review the enclosed instructions. Your monitor  has already been registered assigning a specific monitor serial # to you.  Billing and Patient Assistance Program Information  We have supplied [employer] with any of your insurance information on file for billing purposes. [employer] offers a sliding scale Patient Assistance Program for patients that do not have  insurance, or whose insurance does not completely cover the cost of the ZIO monitor.  You must apply for the Patient Assistance Program to qualify for this discounted rate.  To apply, please call [employer] at [phone], select option 4, select option 2, ask to apply for  Patient Assistance Program. Theodore Demark will ask your household income, and how many people  are in your household. They will quote your out-of-pocket cost based on that information.  [employer] will also be able to set up a 90-month interest-free payment plan if needed.  Applying the monitor   Shave hair from upper left chest.  Hold abrader disc by orange tab. Rub abrader in 40 strokes over the upper left chest as  indicated in your monitor instructions.  Clean area with 4 enclosed alcohol pads. Let dry.  Apply patch as indicated in monitor instructions. Patch will be placed under collarbone on left  side of chest with arrow pointing upward.  Rub patch adhesive wings for 2 minutes. Remove white label marked "1". Remove the white  label marked "2". Rub patch adhesive wings for 2 additional minutes.  While looking in a mirror, press and release button in center of patch. A small green light will  flash 3-4 times. This will be your only indicator that the monitor has been turned on.  Do not shower for the first 24 hours. You may shower after the first 24 hours.  Press the button if you feel a symptom. You will hear a small click. Record Date, Time and  Symptom in the Patient Logbook.  When you are ready to remove the patch, follow  instructions on the last 2 pages of Patient  Logbook. Stick patch monitor onto the last page of Patient Logbook.  Place Patient Logbook in the blue and white box. Use locking tab on box and tape box closed  securely. The blue and white box has prepaid postage on it. Please place it in the mailbox as  soon as possible. Your physician should have your test results approximately 7 days after the  monitor has been mailed back to [facility].  Call [place] at [phone] if you have questions regarding  your ZIO XT patch monitor. Call them immediately if you see an orange light blinking on your  monitor.  If your monitor falls off in less than 4 days, contact our Monitor department at [phone].  If your monitor becomes loose or falls off after 4 days call [employer] at [phone] for  suggestions on securing your monitor

## 2022-04-30 NOTE — Progress Notes (Unsigned)
Enrolled for [employer] to mail a ZIO XT long term holter monitor 09/18/2022,  to the patients address on file.  Patient to wear monitor one month before his appointment with Dr. Caryl Comes in March 2024.

## 2022-05-02 ENCOUNTER — Ambulatory Visit (INDEPENDENT_AMBULATORY_CARE_PROVIDER_SITE_OTHER): Payer: BC Managed Care – PPO | Admitting: Family Medicine

## 2022-05-02 ENCOUNTER — Encounter: Payer: Self-pay | Admitting: Family Medicine

## 2022-05-02 VITALS — BP 106/64 | HR 59 | Temp 98.3°F | Ht 69.0 in | Wt 209.4 lb

## 2022-05-02 DIAGNOSIS — R5383 Other fatigue: Secondary | ICD-10-CM | POA: Insufficient documentation

## 2022-05-02 DIAGNOSIS — M25521 Pain in right elbow: Secondary | ICD-10-CM | POA: Insufficient documentation

## 2022-05-02 DIAGNOSIS — B079 Viral wart, unspecified: Secondary | ICD-10-CM

## 2022-05-02 DIAGNOSIS — R5382 Chronic fatigue, unspecified: Secondary | ICD-10-CM

## 2022-05-02 DIAGNOSIS — Z23 Encounter for immunization: Secondary | ICD-10-CM

## 2022-05-02 LAB — CBC WITH DIFFERENTIAL/PLATELET
Basophils Absolute: 0 10*3/uL (ref 0.0–0.1)
Basophils Relative: 0.5 % (ref 0.0–3.0)
Eosinophils Absolute: 0.2 10*3/uL (ref 0.0–0.7)
Eosinophils Relative: 2 % (ref 0.0–5.0)
HCT: 40.9 % (ref 39.0–52.0)
Hemoglobin: 14.2 g/dL (ref 13.0–17.0)
Lymphocytes Relative: 36 % (ref 12.0–46.0)
Lymphs Abs: 2.8 10*3/uL (ref 0.7–4.0)
MCHC: 34.8 g/dL (ref 30.0–36.0)
MCV: 89.8 fl (ref 78.0–100.0)
Monocytes Absolute: 0.7 10*3/uL (ref 0.1–1.0)
Monocytes Relative: 9.5 % (ref 3.0–12.0)
Neutro Abs: 4.1 10*3/uL (ref 1.4–7.7)
Neutrophils Relative %: 52 % (ref 43.0–77.0)
Platelets: 196 10*3/uL (ref 150.0–400.0)
RBC: 4.55 Mil/uL (ref 4.22–5.81)
RDW: 13.6 % (ref 11.5–15.5)
WBC: 7.8 10*3/uL (ref 4.0–10.5)

## 2022-05-02 LAB — COMPREHENSIVE METABOLIC PANEL
ALT: 35 U/L (ref 0–53)
AST: 25 U/L (ref 0–37)
Albumin: 4.4 g/dL (ref 3.5–5.2)
Alkaline Phosphatase: 34 U/L — ABNORMAL LOW (ref 39–117)
BUN: 10 mg/dL (ref 6–23)
CO2: 29 mEq/L (ref 19–32)
Calcium: 9.5 mg/dL (ref 8.4–10.5)
Chloride: 104 mEq/L (ref 96–112)
Creatinine, Ser: 0.78 mg/dL (ref 0.40–1.50)
GFR: 108.77 mL/min (ref >60.00)
Glucose, Bld: 107 mg/dL — ABNORMAL HIGH (ref 70–99)
Potassium: 4 mEq/L (ref 3.5–5.1)
Sodium: 140 mEq/L (ref 135–145)
Total Bilirubin: 0.5 mg/dL (ref 0.2–1.2)
Total Protein: 7.2 g/dL (ref 6.0–8.3)

## 2022-05-02 LAB — VITAMIN B12: Vitamin B-12: 303 pg/mL (ref 211–911)

## 2022-05-02 LAB — T4, FREE: Free T4: 0.8 ng/dL (ref 0.60–1.60)

## 2022-05-02 LAB — IRON: Iron: 124 ug/dL (ref 42–165)

## 2022-05-02 LAB — TSH: TSH: 1.42 u[IU]/mL (ref 0.35–5.50)

## 2022-05-02 LAB — FERRITIN: Ferritin: 78.3 ng/mL (ref 22.0–322.0)

## 2022-05-02 NOTE — Assessment & Plan Note (Signed)
Fatigue -worse since he had covid but in setting of many health problems over the past 1 1/2 years Depression not evident but in the differential  Plan to message Dr Halford Chessman- ? If needs another sleep eval he is dozing off easily  He will talk with Dr Klein/cardiology re: ? Med role Nl exam Labs ordered Most likely multifactorial Candid about stressors and mood/something to consider  If needing medication for mood would need approval of cardiology as well

## 2022-05-02 NOTE — Assessment & Plan Note (Signed)
Likely tendonitis

## 2022-05-02 NOTE — Assessment & Plan Note (Signed)
With last labs Now taking ca for OP  Re check this with PTH today

## 2022-05-02 NOTE — Patient Instructions (Signed)
I will reach out to Dr Halford Chessman about cpap   You should plan a visit with Dr Caryl Comes   Lab today   Keep the skin area clean -if the  wart/ tag does not fall off let us know  Take care of yourself!

## 2022-05-02 NOTE — Assessment & Plan Note (Signed)
Right wrist  Treated with liquid nitrogen  Disc site care inst to update if this does not resolve in 1-2 weeks

## 2022-05-02 NOTE — Progress Notes (Signed)
Subjective:    Patient ID: [name], male    DOB: 04-27-1976, 46 y.o.   MRN: [id number]  HPI Pt presents with a skin tag/wart  Also with some general health concerns/ fatigue   Wt Readings from Last 3 Encounters:  05/02/22 209 lb 6 oz (95 kg)  04/30/22 210 lb (95.3 kg)  02/03/22 205 lb 3.2 oz (93.1 kg)   30.92 kg/m   Has skin lesion on R wrist  Gets caught on things  Just popped up in the past 4 weeks  ? Skin tag or wart   Fatigue (stamina and nodding off)  Trouble concentrating  Has d/w cardiologist  A lot of little stuff bothering him more   BP Readings from Last 3 Encounters:  05/02/22 106/64  04/30/22 114/78  02/03/22 112/72   Pulse Readings from Last 3 Encounters:  05/02/22 (!) 59  04/30/22 62  02/03/22 (!) 53   Bisoprolol 5 mg daily   Sotalol AF 120 mg   Was tired before medicines  Has PVCs -watching closer    Lot of medical problems in the past year  Has questions for Dr Caryl Comes   Did have covid in the past - ? If post covid syndrome    Back hurts chronically after fracture and also surgery  That wears him out  Cramps in his L leg more than usual (in middle of the night)   Some issues with tennis elbow on the L  Hard to grab with overhand grip    Worries about his sister    It is getting harder to recover from work shifts (12 h)  Even if he sleeps for 6-8 hours he feels really tired  Feels like it needs to take a nap in afternoons  Has cpap    Lab Results  Component Value Date   CREATININE 0.78 01/07/2022   BUN 12 01/07/2022   NA 140 01/07/2022   K 4.6 01/07/2022   CL 100 01/07/2022   CO2 26 01/07/2022    Wonders if fosamax gives him side effects Taking citrical  Ca was up a bit in may    Sometimes he wonders if depression is in play  Motivated but low on energy   Lab Results  Component Value Date   PTH 34 06/28/2021   CALCIUM 10.4 (H) 01/07/2022   CAION 1.25 09/28/2011   PHOS 3.3 06/28/2021   Patient Active  Problem List   Diagnosis Date Noted   Right elbow pain 05/02/2022   Fatigue 05/02/2022   Serum calcium elevated 05/02/2022   Paresthesia of left arm 01/23/2022   Cough 01/01/2022   Localized osteoporosis with current pathological fracture 10/24/2021   Osteopenia 06/18/2021   Genital warts 06/03/2021   Condyloma 06/03/2021   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   Other skin changes due to chronic exposure to nonionizing radiation 06/03/2021   Lumbar radiculopathy 05/14/2021   Stress reaction 03/07/2021   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[place]) 09/14/2019   Kidney stone 06/13/2019   Wart 05/24/2019   BPH (benign prostatic hyperplasia) 07/27/2018   Elevated glucose level 04/06/2017   Routine general medical examination at a health care facility 03/28/2016   Hypertriglyceridemia 02/21/2014   Low HDL (under 40) 02/21/2014   Vitamin D deficiency 02/21/2014   Allergic rhinitis 02/21/2014   Erectile dysfunction 08/19/2013   OSA (obstructive sleep apnea) 10/30/2011   Environmental allergies 10/27/2011   High risk medications (not anticoagulants) long-term use 10/27/2011   Hypokalemia 09/29/2011   Arrhythmogenic RV  cardiomyopathy 03/04/2011   Implantable cardioverter-defibrillator (ICD) in situ 01/10/2011   PTSD (post-traumatic stress disorder) 2/2 ICD shocks 01/10/2011   Fatty liver 01/04/2009   GERD 12/05/2008   History of ventricular tachycardia 11/03/2008   ECZEMA 08/17/2007   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   Asthma when younger   Fatty liver    History of chicken pox    History of kidney stones    ICD (implantable cardiac defibrillator) in place    Osteopenia    Pancreatitis    Sleep apnea    uses cpap   Ventricular tachycardia (HCC)    sotalol therapy;  catheter [facility] 10/10 and [facility] 2013   Past  Surgical History:  Procedure Laterality Date   CARDIAC CATHETERIZATION  06/01/2006   CARDIAC DEFIBRILLATOR PLACEMENT  06/03/2006   [employer]   CHOLECYSTECTOMY N/A 08/01/2021   Procedure: LAPAROSCOPIC CHOLECYSTECTOMY WITH INTRAOPERATIVE CHOLANGIOGRAM;  Surgeon: Erroll Luna, MD;  Location: [facility];  Service: General;  Laterality: N/A;   HAND SURGERY Left    ICD GENERATOR CHANGEOUT N/A 11/04/2019   Procedure: ICD [place];  Surgeon: Deboraha Sprang, MD;  Location: [place] CV LAB;  Service: Cardiovascular;  Laterality: N/A;   IMPLANTABLE CARDIOVERTER DEFIBRILLATOR GENERATOR CHANGE N/A 12/11/2011   Procedure: IMPLANTABLE CARDIOVERTER DEFIBRILLATOR GENERATOR CHANGE;  Surgeon: Deboraha Sprang, MD;  Location: [facility] CATH LAB;  Service: Cardiovascular;  Laterality: N/A;   KYPHOPLASTY N/A 05/14/2021   Procedure: Lumbar Five Vertebroplasty;  Surgeon: Judith Part, MD;  Location: [place];  Service: Neurosurgery;  Laterality: N/A;   LUMBAR LAMINECTOMY/ DECOMPRESSION WITH MET-RX N/A 05/14/2021   Procedure: Lumbar Four-Five Minimally Invasive Laminectomy;  Surgeon: Judith Part, MD;  Location: [place];  Service: Neurosurgery;  Laterality: N/A;  3C/RM 19   TONSILLECTOMY     vt ablation  10/10, 10/13   x 2   WISDOM TOOTH EXTRACTION     Social History   Tobacco Use   Smoking status: Former    Packs/day: 1.00    Years: 18.00    Total pack years: 18.00    Types: Cigarettes    Start date: 01/17/1990    Quit date: 08/19/2007    Years since quitting: 14.7   Smokeless tobacco: Never  Vaping Use   Vaping Use: Never used  Substance Use Topics   Alcohol use: No    Alcohol/week: 0.0 standard drinks of alcohol   Drug use: No   Family History  Problem Relation Age of Onset   Sudden death Father    Asthma Mother    Diabetes Other    Stroke Other    Heart attack Other    Prostate cancer Other    Breast cancer Other    Ovarian cancer Other    Uterine cancer Other    Colon cancer  Other    Drug abuse Other    Depression Other    Lung cancer Maternal Grandfather    Heart disease Maternal Grandfather    Heart disease Paternal Grandfather    Hypertension Paternal Grandmother    Allergies  Allergen Reactions   Bee Venom Anaphylaxis   Lidocaine     [name]   Current Outpatient Medications on File Prior to Visit  Medication Sig Dispense Refill   alendronate (FOSAMAX) 70 MG tablet Take 1 tablet (70 mg total) by  mouth every 7 (seven) days. Take with a full glass of water on an empty stomach. 13 tablet 3   Ascorbic Acid (VITAMIN C) 1000 MG tablet Take 1,000 mg by mouth at bedtime.     bisoprolol (ZEBETA) 5 MG tablet Take 1 tablet (5 mg total) by mouth daily. 90 tablet 3   Calcium-Phosphorus-Vitamin D (CITRACAL +D3 PO) Take 1 tablet by mouth daily.     Cholecalciferol (VITAMIN D3) 50 MCG (2000 UT) TABS Take 2,000 Units by mouth daily.     fluticasone (FLONASE) 50 MCG/ACT nasal spray Place into both nostrils daily.     KLOR-CON M10 10 MEQ tablet TAKE 4 TABLETS BY MOUTH TWICE A DAY 720 tablet 3   magnesium oxide (MAG-OX) 400 (240 Mg) MG tablet TAKE 1 TABLET BY MOUTH TWICE A DAY 180 tablet 2   montelukast (SINGULAIR) 10 MG tablet TAKE 1 TABLET BY MOUTH EVERY DAY 90 tablet 1   Multiple Vitamin (MULITIVITAMIN WITH MINERALS) TABS Take 1 tablet by mouth daily.     SOTALOL AF 120 MG TABS TAKE 1 & 1/2 TABLETS BY MOUTH TWICE DAILY 270 tablet 2   tadalafil (CIALIS) 5 MG tablet Take 5 mg by mouth daily as needed for erectile dysfunction.     Zinc 30 MG CAPS Take 30 mg by mouth at bedtime.     No current facility-administered medications on file prior to visit.      Review of Systems  Constitutional:  Positive for fatigue. Negative for activity change, appetite change, fever and unexpected weight change.  HENT:  Negative for congestion, rhinorrhea, sore throat and trouble swallowing.   Eyes:  Negative for pain, redness, itching and visual disturbance.  Respiratory:   Negative for cough, chest tightness, shortness of breath and wheezing.   Cardiovascular:  Negative for chest pain and palpitations.  Gastrointestinal:  Negative for abdominal pain, blood in stool, constipation, diarrhea and nausea.  Endocrine: Negative for cold intolerance, heat intolerance, polydipsia and polyuria.  Genitourinary:  Negative for difficulty urinating, dysuria, frequency and urgency.  Musculoskeletal:  Negative for arthralgias, joint swelling and myalgias.  Skin:  Negative for pallor and rash.  Neurological:  Negative for dizziness, tremors, weakness, numbness and headaches.  Hematological:  Negative for adenopathy. Does not bruise/bleed easily.  Psychiatric/Behavioral:  Negative for agitation, decreased concentration, dysphoric mood and self-injury. The patient is not nervous/anxious.        Sleep is fair          Objective:   Physical Exam Constitutional:      General: He is not in acute distress.    Appearance: Normal appearance. He is well-developed. He is obese. He is not ill-appearing or diaphoretic.  HENT:     Head: Normocephalic and atraumatic.     Nose: Nose normal.     Mouth/Throat:     Mouth: Mucous membranes are moist.     Pharynx: Oropharynx is clear.  Eyes:     General: No scleral icterus.       Right eye: No discharge.        Left eye: No discharge.     Conjunctiva/sclera: Conjunctivae normal.     Pupils: Pupils are equal, round, and reactive to light.  Neck:     Thyroid: No thyromegaly.     Vascular: No carotid bruit or JVD.  Cardiovascular:     Rate and Rhythm: Regular rhythm. Bradycardia present.     Heart sounds: Normal heart sounds.     No gallop.  Pulmonary:  Effort: Pulmonary effort is normal. No respiratory distress.     Breath sounds: Normal breath sounds. No stridor. No wheezing, rhonchi or rales.  Chest:     Chest wall: No tenderness.  Abdominal:     General: There is no distension or abdominal bruit.     Palpations: Abdomen  is soft. There is no mass.     Tenderness: There is no abdominal tenderness.     Hernia: No hernia is present.  Musculoskeletal:     Cervical back: Normal range of motion and neck supple. No tenderness.     Right lower leg: No edema.     Left lower leg: No edema.  Lymphadenopathy:     Cervical: No cervical adenopathy.  Skin:    General: Skin is warm and dry.     Coloration: Skin is not pale.     Findings: No rash.     Comments: 2-3 mm warty growth over dorsal R wrist  Area cleaned   After consent obtained , cryo tx performed with liquid nitrogen (full freeze and thaw times 3) Pt tolerated well    Neurological:     Mental Status: He is alert.     Cranial Nerves: No cranial nerve deficit.     Coordination: Coordination normal.     Deep Tendon Reflexes: Reflexes are normal and symmetric. Reflexes normal.  Psychiatric:        Mood and Affect: Mood normal.           Assessment & Plan:   Problem List Items Addressed This Visit       Other   Fatigue - Primary    Fatigue -worse since he had covid but in setting of many health problems over the past 1 1/2 years Depression not evident but in the differential  Plan to message Dr Halford Chessman- ? If needs another sleep eval he is dozing off easily  He will talk with Dr Klein/cardiology re: ? Med role Nl exam Labs ordered Most likely multifactorial Candid about stressors and mood/something to consider  If needing medication for mood would need approval of cardiology as well       Relevant Orders   Comprehensive metabolic panel   TSH   Vitamin B12   T4, free   CBC with Differential/Platelet   Iron   Ferritin   Right elbow pain    Likely tendonitis       Serum calcium elevated    With last labs Now taking ca for OP  Re check this with PTH today      Relevant Orders   PTH, Intact and Calcium   Wart    Right wrist  Treated with liquid nitrogen  Disc site care inst to update if this does not resolve in 1-2 weeks        Other Visit Diagnoses     Need for influenza vaccination       Relevant Orders   Flu Vaccine QUAD 6+ mos PF IM (Fluarix Quad PF) (Completed)

## 2022-05-03 LAB — PTH, INTACT AND CALCIUM
Calcium: 9.6 mg/dL (ref 8.6–10.3)
PTH: 34 pg/mL (ref 16–77)

## 2022-05-05 ENCOUNTER — Ambulatory Visit (INDEPENDENT_AMBULATORY_CARE_PROVIDER_SITE_OTHER): Payer: BC Managed Care – PPO

## 2022-05-05 DIAGNOSIS — I472 Ventricular tachycardia, unspecified: Secondary | ICD-10-CM | POA: Diagnosis not present

## 2022-05-06 ENCOUNTER — Telehealth: Payer: Self-pay

## 2022-05-06 LAB — CUP PACEART REMOTE DEVICE CHECK
Battery Remaining Longevity: 121 mo
Battery Voltage: 3.03 V
Brady Statistic RV Percent Paced: 0.6 %
Date Time Interrogation Session: 20230918043824
HighPow Impedance: 51 Ohm
HighPow Impedance: 75 Ohm
Implantable Lead Implant Date: 20071018
Implantable Lead Location: 753860
Implantable Lead Model: 6947
Implantable Pulse Generator Implant Date: 20210319
Lead Channel Impedance Value: 342 Ohm
Lead Channel Impedance Value: 399 Ohm
Lead Channel Pacing Threshold Amplitude: 0.875 V
Lead Channel Pacing Threshold Pulse Width: 0.4 ms
Lead Channel Sensing Intrinsic Amplitude: 13 mV
Lead Channel Sensing Intrinsic Amplitude: 13 mV
Lead Channel Setting Pacing Amplitude: 2 V
Lead Channel Setting Pacing Pulse Width: 0.4 ms
Lead Channel Setting Sensing Sensitivity: 1.2 mV

## 2022-05-06 NOTE — Telephone Encounter (Signed)
-----   Message from Chesley Mires, MD sent at 05/06/2022 12:11 AM EDT ----- Carron Brazen, can you get a copy of his CPAP download please.  V  ----- Message ----- From: Abner Greenspan, MD Sent: 05/02/2022   1:53 PM EDT To: Chesley Mires, MD  Mr Stuckey is experiencing more nodding off and general day time sleepiness - wondering if he may need any cpap adjustments or re evaluation ?  Thanks for taking care of him!

## 2022-05-06 NOTE — Telephone Encounter (Signed)
Please let him know his CPAP download shows good control of his sleep apnea.  If he is still having trouble with daytime sleepiness, then please schedule an ROV with me or one of the NPs to discuss in more detail.

## 2022-05-07 NOTE — Telephone Encounter (Signed)
ATC patient. Left a detailed vm (ok per dpr) with message from Dr. Halford Chessman. Advised patient to call back to make an appt if he is still experiencing daytime sleepiness

## 2022-05-20 NOTE — Progress Notes (Signed)
Remote ICD transmission.   

## 2022-06-02 ENCOUNTER — Encounter: Payer: Self-pay | Admitting: Family Medicine

## 2022-06-03 ENCOUNTER — Ambulatory Visit: Payer: BC Managed Care – PPO | Admitting: Nurse Practitioner

## 2022-06-03 VITALS — BP 114/74 | HR 90 | Temp 97.0°F | Resp 18 | Wt 209.5 lb

## 2022-06-03 DIAGNOSIS — Z8639 Personal history of other endocrine, nutritional and metabolic disease: Secondary | ICD-10-CM | POA: Insufficient documentation

## 2022-06-03 DIAGNOSIS — R1084 Generalized abdominal pain: Secondary | ICD-10-CM | POA: Diagnosis not present

## 2022-06-03 DIAGNOSIS — R112 Nausea with vomiting, unspecified: Secondary | ICD-10-CM | POA: Diagnosis not present

## 2022-06-03 LAB — COMPREHENSIVE METABOLIC PANEL
ALT: 33 U/L (ref 0–53)
AST: 23 U/L (ref 0–37)
Albumin: 4.4 g/dL (ref 3.5–5.2)
Alkaline Phosphatase: 32 U/L — ABNORMAL LOW (ref 39–117)
BUN: 7 mg/dL (ref 6–23)
CO2: 31 mEq/L (ref 19–32)
Calcium: 9.8 mg/dL (ref 8.4–10.5)
Chloride: 104 mEq/L (ref 96–112)
Creatinine, Ser: 0.74 mg/dL (ref 0.40–1.50)
GFR: 110.44 mL/min (ref >60.00)
Glucose, Bld: 109 mg/dL — ABNORMAL HIGH (ref 70–99)
Potassium: 3.9 mEq/L (ref 3.5–5.1)
Sodium: 140 mEq/L (ref 135–145)
Total Bilirubin: 0.6 mg/dL (ref 0.2–1.2)
Total Protein: 7.1 g/dL (ref 6.0–8.3)

## 2022-06-03 LAB — CBC
HCT: 41.6 % (ref 39.0–52.0)
Hemoglobin: 14.1 g/dL (ref 13.0–17.0)
MCHC: 34 g/dL (ref 30.0–36.0)
MCV: 89.6 fl (ref 78.0–100.0)
Platelets: 201 10*3/uL (ref 150.0–400.0)
RBC: 4.64 Mil/uL (ref 4.22–5.81)
RDW: 12.8 % (ref 11.5–15.5)
WBC: 7.5 10*3/uL (ref 4.0–10.5)

## 2022-06-03 LAB — LIPASE: Lipase: 29 U/L (ref 11.0–59.0)

## 2022-06-03 NOTE — Assessment & Plan Note (Signed)
Has subsided.  Patient states he does have some Zofran he can take at home if needed.  Offered to renew prescription patient declined.  Since patient is on sotalol and additional beta-blocker did check QTc last EKG of May 2023 QTc was 423.

## 2022-06-03 NOTE — Progress Notes (Signed)
Established Patient Office Visit  Subjective   Patient ID: [name], male    DOB: [birth year]  Age: 46 y.o. MRN: [id number]  Chief Complaint  Patient presents with   Nausea    On 06/01/22 in the morning woke up with nausea, vomiting x 2, chills/sweats, malaise. Some stomach upset sensation still present, nausea improved. Decreased appetite. He called out of work on 06/01/22 and needs a note for work.     HPI  Nausea: states that he woke up 3am on Sunday morning. States nausea, stomach cramps and vomited twice. States that he was having some night sweats so he thought he had a fever. Throughout the rest of the day he had nausea and lack of appetite. States that it improved through Monday States that he has had some diarrhea but has that intermittent since his gallbladder, but no blood  No sick contacts. Has a toddler but not acutely ill.  Had some steak and rice the night before that was a different meal from the family. Also had indigestion that night afterward.   States that he does not use NSAIDs regularly. He no longer drinks alcohol. He has had his gallbladder removed. No history of H pylori per patient report  Patient states he does have a history of severe hyperkalemia but does have a defibrillator is followed by cardiology and on potassium supplement daily.  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    Review of Systems  Constitutional:  Positive for malaise/fatigue. Negative for chills and fever.  HENT:  Negative for ear discharge, ear pain, sinus pain and sore throat.   Respiratory:  Negative for cough and shortness of breath.   Cardiovascular:  Negative for chest pain.  Gastrointestinal:  Positive for abdominal pain, nausea and vomiting.  Genitourinary:  Negative for dysuria and hematuria.  Neurological:  Positive for headaches.      Objective:     BP 114/74   Pulse 90   Temp (!) 97 F (36.1 C) (Temporal)   Resp 18   Wt  209 lb 8 oz (95 kg)   SpO2 98%   BMI 30.94 kg/m    Physical Exam Vitals and nursing note reviewed.  Constitutional:      Appearance: Normal appearance.  Cardiovascular:     Rate and Rhythm: Normal rate and regular rhythm.     Heart sounds: Normal heart sounds.  Pulmonary:     Effort: Pulmonary effort is normal.     Breath sounds: Normal breath sounds.  Abdominal:     General: Bowel sounds are normal. There is no distension.     Palpations: There is no mass.     Tenderness: There is generalized abdominal tenderness.     Hernia: No hernia is present.    Neurological:     Mental Status: He is alert.      No results found for any visits on 06/03/22.    The 10-year ASCVD risk score (Arnett DK, et al., 2019) is: 2.1%    Assessment & Plan:   Problem List Items Addressed This Visit       Digestive   Nausea and vomiting - Primary    Has subsided.  Patient states he does have some Zofran he can take at home if needed.  Offered to renew prescription patient declined.  Since patient is on sotalol and additional beta-blocker did check QTc last EKG of May 2023 QTc was 423.  Relevant Orders   Comprehensive metabolic panel   Lipase   CBC     Other   History of hypokalemia    Patient has a history of severe hypokalemia to the point because of ventricular dysrhythmia.  Patient currently on potassium replacement.  He did only evaluate twice.  We will check electrolytes to make sure his potassiums were needs to be.  Does have a defibrillator and is followed by cardiology      Relevant Orders   Comprehensive metabolic panel   Generalized abdominal pain    Likely secondary to viral infection.  Patient denies recurrent NSAID use or alcohol use.  We will check basic labs inclusive of lipase.  Follow-up if no improvement      Relevant Orders   Lipase   CBC    Return if symptoms worsen or fail to improve.    Romilda Garret, NP

## 2022-06-03 NOTE — Assessment & Plan Note (Signed)
Patient has a history of severe hypokalemia to the point because of ventricular dysrhythmia.  Patient currently on potassium replacement.  He did only evaluate twice.  We will check electrolytes to make sure his potassiums were needs to be.  Does have a defibrillator and is followed by cardiology

## 2022-06-03 NOTE — Assessment & Plan Note (Signed)
Likely secondary to viral infection.  Patient denies recurrent NSAID use or alcohol use.  We will check basic labs inclusive of lipase.  Follow-up if no improvement

## 2022-06-03 NOTE — Patient Instructions (Signed)
Nice to see you today If you are feeling well enough, I think that it is ok to go to work tonight I will be in touch with the labs once I have them Follow up if no improvement

## 2022-06-08 ENCOUNTER — Other Ambulatory Visit: Payer: Self-pay | Admitting: Internal Medicine

## 2022-06-17 ENCOUNTER — Encounter: Payer: Self-pay | Admitting: Family Medicine

## 2022-06-17 DIAGNOSIS — F4310 Post-traumatic stress disorder, unspecified: Secondary | ICD-10-CM

## 2022-06-23 NOTE — Addendum Note (Signed)
Addended by: Loura Pardon A on: 06/23/2022 08:57 PM   Modules accepted: Orders

## 2022-07-02 ENCOUNTER — Ambulatory Visit (INDEPENDENT_AMBULATORY_CARE_PROVIDER_SITE_OTHER): Payer: BC Managed Care – PPO | Admitting: Psychologist

## 2022-07-02 DIAGNOSIS — F321 Major depressive disorder, single episode, moderate: Secondary | ICD-10-CM | POA: Diagnosis not present

## 2022-07-02 DIAGNOSIS — F411 Generalized anxiety disorder: Secondary | ICD-10-CM

## 2022-07-02 NOTE — Progress Notes (Signed)
[place] Counselor Initial Adult Exam  Name: [name] Date: 07/02/2022 MRN: [id number] DOB: 11-Apr-1976 PCP: Abner Greenspan, MD  Time spent: 01:05 pm to 01:45 pm; total time: 40 minutes  This session was held via in person. The patient consented to in-person therapy and was in the clinician's office. Limits of confidentiality were discussed with the patient.   Guardian/Payee:  NA    Paperwork requested: No   Reason for Visit /Presenting Problem: Anxiety and depression  Mental Status Exam: Appearance:   Well Groomed     Behavior:  Appropriate  Motor:  Normal  Speech/Language:   Clear and Coherent  Affect:  Appropriate  Mood:  normal  Thought process:  normal  Thought content:    WNL  Sensory/Perceptual disturbances:    WNL  Orientation:  oriented to person  Attention:  Good  Concentration:  Good  Memory:  WNL  Fund of knowledge:   Good  Insight:    Fair  Judgment:   Good  Impulse Control:  Good    Reported Symptoms:  The patient endorsed experiencing the following: feeling down, sad, tearful, social isolation, fatigue, rumination of negative thoughts, lack of motivation, low self-esteem, and thoughts of hopelessness. The patient denied suicidal and homicidal ideation.   The patient endorsed experiencing the following: racing thoughts, feeling on edge, feeling restless, and easily feeling overwhelmed. He denied suicidal and homicidal ideation.   Risk Assessment: Danger to Self:  No Self-injurious Behavior: No Danger to Others: No Duty to Warn:no Physical Aggression / Violence:No  Access to Firearms a concern: No  Gang Involvement:No  Patient / guardian was educated about steps to take if suicide or homicide risk level increases between visits: n/a While future psychiatric events cannot be accurately predicted, the patient does not currently require acute inpatient psychiatric care and does not currently meet [facility] involuntary commitment  criteria.  Substance Abuse History: Current substance abuse: No     Past Psychiatric History:   Previous psychological history is significant for PTSD Outpatient Providers:[facility] program History of Psych Hospitalization: No  Psychological Testing:  NA    Abuse History:  Victim of: No.,  NA    Report needed: No. Victim of Neglect:No. Perpetrator of  NA   Witness / Exposure to Domestic Violence: No   Protective Services Involvement: No  Witness to [employer] Violence:  No   Family History:  Family History  Problem Relation Age of Onset   Sudden death Father    Asthma Mother    Diabetes Other    Stroke Other    Heart attack Other    Prostate cancer Other    Breast cancer Other    Ovarian cancer Other    Uterine cancer Other    Colon cancer Other    Drug abuse Other    Depression Other    Lung cancer Maternal Grandfather    Heart disease Maternal Grandfather    Heart disease Paternal Grandfather    Hypertension Paternal Grandmother     Living situation: the patient lives with their family  Sexual Orientation: Straight  Relationship Status: married  Name of spouse / other:Jennifer  If a parent, number of children / ages:Patient has one daughter and one daughter is expected to arrive in the next month.   Support Systems: spouse  Financial Stress:  No   Income/Employment/Disability: Employment. 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   Asthma when younger   Fatty liver    History of chicken pox    History of kidney stones    ICD (implantable cardiac defibrillator) in place    Osteopenia    Pancreatitis    Sleep apnea    uses cpap   Ventricular tachycardia (HCC)    sotalol therapy;  catheter ablation at [facility] 10/10 and [facility] 2013    Past Surgical History:  Procedure Laterality Date   CARDIAC CATHETERIZATION  06/01/2006   CARDIAC DEFIBRILLATOR PLACEMENT  06/03/2006   [employer]   CHOLECYSTECTOMY N/A 08/01/2021   Procedure: LAPAROSCOPIC CHOLECYSTECTOMY WITH INTRAOPERATIVE CHOLANGIOGRAM;  Surgeon: Erroll Luna, MD;  Location: [facility];  Service: General;  Laterality: N/A;   HAND SURGERY Left    ICD GENERATOR CHANGEOUT N/A 11/04/2019   Procedure: ICD [place];  Surgeon: Deboraha Sprang, MD;  Location: [place] CV LAB;  Service: Cardiovascular;  Laterality: N/A;   IMPLANTABLE CARDIOVERTER DEFIBRILLATOR GENERATOR CHANGE N/A 12/11/2011   Procedure: IMPLANTABLE CARDIOVERTER DEFIBRILLATOR GENERATOR CHANGE;  Surgeon: Deboraha Sprang, MD;  Location: [facility] CATH LAB;  Service: Cardiovascular;  Laterality: N/A;   KYPHOPLASTY N/A 05/14/2021   Procedure: Lumbar Five Vertebroplasty;  Surgeon: Judith Part, MD;  Location: [place];  Service: Neurosurgery;  Laterality: N/A;   LUMBAR LAMINECTOMY/ DECOMPRESSION WITH MET-RX N/A 05/14/2021   Procedure: Lumbar Four-Five Minimally Invasive Laminectomy;  Surgeon: Judith Part, MD;  Location: [place];  Service: Neurosurgery;  Laterality: N/A;  3C/RM 19   TONSILLECTOMY     vt ablation  10/10, 10/13   x 2   WISDOM TOOTH EXTRACTION      Medications: Current Outpatient Medications  Medication Sig Dispense Refill   alendronate (FOSAMAX) 70 MG tablet Take 1 tablet (70 mg total) by mouth every 7 (seven) days. Take  with a full glass of water on an empty stomach. 13 tablet 3   Ascorbic Acid (VITAMIN C) 1000 MG tablet Take 1,000 mg by mouth at bedtime.     bisoprolol (ZEBETA) 5 MG tablet Take 1 tablet (5 mg total) by mouth daily. 90 tablet 3   Calcium-Phosphorus-Vitamin D (CITRACAL +D3 PO) Take 1 tablet by mouth daily.     Cholecalciferol (VITAMIN D3) 50 MCG (2000 UT) TABS Take 2,000 Units by mouth daily.     fluticasone (FLONASE) 50 MCG/ACT nasal spray Place into both nostrils daily.     KLOR-CON M10 10 MEQ tablet TAKE 4 TABLETS BY MOUTH TWICE A DAY 720 tablet 3   magnesium oxide (MAG-OX) 400 (240 Mg) MG tablet TAKE 1 TABLET BY MOUTH TWICE A DAY 180 tablet 2   montelukast (SINGULAIR) 10 MG tablet TAKE 1 TABLET BY MOUTH EVERY DAY 90 tablet 1   Multiple Vitamin (MULITIVITAMIN WITH MINERALS) TABS Take 1 tablet by mouth daily.     SOTALOL AF 120 MG TABS TAKE 1 & 1/2 TABLETS BY MOUTH TWICE DAILY 270 tablet 2  tadalafil (CIALIS) 5 MG tablet Take 5 mg by mouth daily as needed for erectile dysfunction.     Zinc 30 MG CAPS Take 30 mg by mouth at bedtime.     No current facility-administered medications for this visit.    Allergies  Allergen Reactions   Bee Venom Anaphylaxis   Lidocaine     [name]    Diagnoses:  F32.1 major depressive affective disorder, single episode, moderate and F41.1 generalized anxiety disorder  Plan of Care: The patient is a 46 year old Caucasian male who was referred due to experiencing anxiety and depressive symptoms secondary to experiencing a heart condition. The patient lives at home with his wife and daughter. The patient meets criteria for a diagnosis of F32.1 major depressive affective disorder, single episode, moderate based off of the following: feeling down, sad, tearful, social isolation, fatigue, rumination of negative thoughts, lack of motivation, low self-esteem, and thoughts of hopelessness. The patient denied suicidal and homicidal ideation. The patient meets  criteria for a diagnosis of F41.1 generalized anxiety disorder based off of the following: racing thoughts, feeling on edge, feeling restless, and easily feeling overwhelmed. He denied suicidal and homicidal ideation.   The patient stated that he wants coping skills, and to process his health. Patient has a difficult time looking to the future because of his heart. He wants to live for his family.  This psychologist makes the recommendation that the patient participate in therapy bi-weekly if possible.    Conception Chancy, PsyD

## 2022-07-02 NOTE — Progress Notes (Signed)
                Timeka Goette, PsyD 

## 2022-07-02 NOTE — Plan of Care (Signed)

## 2022-07-16 ENCOUNTER — Encounter: Payer: BC Managed Care – PPO | Admitting: Internal Medicine

## 2022-07-21 ENCOUNTER — Ambulatory Visit: Payer: BC Managed Care – PPO | Admitting: Psychologist

## 2022-07-21 DIAGNOSIS — F411 Generalized anxiety disorder: Secondary | ICD-10-CM

## 2022-07-21 DIAGNOSIS — F321 Major depressive disorder, single episode, moderate: Secondary | ICD-10-CM | POA: Diagnosis not present

## 2022-07-21 NOTE — Progress Notes (Signed)
[place] Counselor/Therapist Progress Note  Patient ID: [name], MRN: [id number],    Date: 07/21/2022  Time Spent: 01:07 pm to 01:45 pm; total time: 38 minutes   This session was held via in person. The patient consented to in-person therapy and was in the clinician's office. Limits of confidentiality were discussed with the patient.    Treatment Type: Individual Therapy  Reported Symptoms: Anxiety  Mental Status Exam: Appearance:  Well Groomed     Behavior: Appropriate  Motor: Normal  Speech/Language:  Clear and Coherent  Affect: Appropriate  Mood: normal  Thought process: normal  Thought content:   WNL  Sensory/Perceptual disturbances:   WNL  Orientation: oriented to person, place, and time/date  Attention: Good  Concentration: Good  Memory: WNL  Fund of knowledge:  Good  Insight:   Fair  Judgment:  Good  Impulse Control: Good   Risk Assessment: Danger to Self:  No Self-injurious Behavior: No Danger to Others: No Duty to Warn:no Physical Aggression / Violence:No  Access to Firearms a concern: No  Gang Involvement:No   Subjective: Beginning the session, patient described himself as okay. After reviewing the treatment plan, patient talked about experiencing "neuroticism" surrounding  his wife's Sonia Baller) current pregnancy. Per the patient, she is scheduled to undergo a C-section on Friday. He spent time reflecting on different thoughts and emotions he is experiencing. He was open to the idea of drawing to help ease the anxiety he is experiencing. He asked to follow up after delivery of his child. He denied suicidal and homicidal ideation.   Interventions:  Worked on developing a therapeutic relationship with the patient using active listening and reflective statements. Provided emotional support using empathy and validation. Reviewed the treatment plan with the patient. Reviewed events since the intake. Normalized and validated expressed thoughts and  emotions. Identified goals for the session. Processed the different thoughts and emotions patient expressed related to his wife. Validated patient's experience. Used socratic questions to assist the patient. Explored different coping strategies to assist the patient. Processed the idea of using drawing. Assessed for suicidal and homicidal ideation.   Homework: Use drawing as an outlet  Next Session: NA  Diagnosis: F32.1 major depressive affective disorder, single episode, moderate and F41.1 generalized anxiety disorder  Plan:   Goals Work through the grieving process and face reality of own death Accept emotional support from others around them Live life to the fullest, event though time may be limited Become as knowledgeable about the medical condition  Reduce fear, anxiety about the health condition  Accept the illness Accept the role of psychological and behavioral factors  Stabilize anxiety level wile increasing ability to function Learn and implement coping skills that result in a reduction of anxiety  Alleviate depressive symptoms Recognize, accept, and cope with depressive feelings Develop healthy thinking patterns Develop healthy interpersonal relationships  Objectives target date for all objectives is 07/03/2023 Identify feelings associated with the illness Family members share with each other feelings Identify the losses or limitations that have been experienced Verbalize acceptance of the reality of the medical condition Commit to learning and implement a proactive approach to managing personal stresses Verbalize an understanding of the medical condition Work with therapist to develop a plan for coping with stress Learn and implement skills for managing stress Engage in social, productive activities that are possible Engage in faith based activities implement positive imagery Identify coping skills and sources of emotional support Patient's partner and family members  verbalize their fears  regarding severity of health condition Identify sources of emotional distress  Learning and implement calming skills to reduce overall anxiety Learn and implement problem solving strategies Identify and engage in pleasant activities Learning and implement personal and interpersonal skills to reduce anxiety and improve interpersonal relationships Learn to accept limitations in life and commit to tolerating, rather than avoiding, unpleasant emotions while accomplishing meaningful goals Identify major life conflicts from the past and present that form the basis for present anxiety Learn and implement behavioral strategies Verbalize an understanding and resolution of current interpersonal problems Learn and implement problem solving and decision making skills Learn and implement conflict resolution skills to resolve interpersonal problems Verbalize an understanding of healthy and unhealthy emotions verbalize insight into how past relationships may be influence current experiences with depression Use mindfulness and acceptance strategies and increase value based behavior  Increase hopeful statements about the future.   Interventions Teach about stress and ways to handle stress Assist the patient in developing a coping action plan for stressors Conduct skills based training for coping strategies Train problem focused skills Sort out what activities the individual can do Encourage patient to rely upon his/her spiritual faith Teach the patient to use guided imagery Probe and evaluate family's ability to provide emotional support Allow family to share their fears Assist the patient in identifying, sorting through, and verbalizing the various feelings generated by his/her medical condition Meet with family members  Ask patient list out limitations  Use stress inoculation training  Use Acceptance and Commitment Therapy to help client accept uncomfortable realities in order to  accomplish value-consistent goals Reinforce the client's insight into the role of his/her past emotional pain and present anxiety  Discuss examples demonstrating that unrealistic worry overestimates the probability of threats and underestimate patient's ability  Assist the patient in analyzing his or her worries Help patient understand that avoidance is reinforcing  Behavioral activation help the client explore the relationship, nature of the dispute,  Help the client develop new interpersonal skills and relationships Conduct Problem so living therapy Teach conflict resolution skills Use a process-experiential approach Conduct TLDP Conduct ACT  The patient and clinician reviewed the treatment plan on 07/21/2022. The patient agreed with the treatment plan.   Conception Chancy, PsyD

## 2022-07-21 NOTE — Progress Notes (Signed)
                Iwao Shamblin, PsyD 

## 2022-07-22 ENCOUNTER — Other Ambulatory Visit: Payer: Self-pay | Admitting: Internal Medicine

## 2022-08-04 ENCOUNTER — Ambulatory Visit (INDEPENDENT_AMBULATORY_CARE_PROVIDER_SITE_OTHER): Payer: BC Managed Care – PPO

## 2022-08-04 DIAGNOSIS — I472 Ventricular tachycardia, unspecified: Secondary | ICD-10-CM

## 2022-08-05 LAB — CUP PACEART REMOTE DEVICE CHECK
Battery Remaining Longevity: 119 mo
Battery Voltage: 3.03 V
Brady Statistic RV Percent Paced: 0.74 %
Date Time Interrogation Session: 20231218033423
HighPow Impedance: 53 Ohm
HighPow Impedance: 74 Ohm
Implantable Lead Connection Status: 753985
Implantable Lead Implant Date: 20071018
Implantable Lead Location: 753860
Implantable Lead Model: 6947
Implantable Pulse Generator Implant Date: 20210319
Lead Channel Impedance Value: 342 Ohm
Lead Channel Impedance Value: 418 Ohm
Lead Channel Pacing Threshold Amplitude: 0.75 V
Lead Channel Pacing Threshold Pulse Width: 0.4 ms
Lead Channel Sensing Intrinsic Amplitude: 12 mV
Lead Channel Sensing Intrinsic Amplitude: 12 mV
Lead Channel Setting Pacing Amplitude: 2 V
Lead Channel Setting Pacing Pulse Width: 0.4 ms
Lead Channel Setting Sensing Sensitivity: 1.2 mV
Zone Setting Status: 755011
Zone Setting Status: 755011

## 2022-08-28 ENCOUNTER — Other Ambulatory Visit: Payer: Self-pay | Admitting: Internal Medicine

## 2022-09-01 ENCOUNTER — Encounter: Payer: Self-pay | Admitting: Nurse Practitioner

## 2022-09-01 ENCOUNTER — Telehealth: Payer: Self-pay | Admitting: Family Medicine

## 2022-09-01 ENCOUNTER — Ambulatory Visit: Payer: BC Managed Care – PPO | Admitting: Nurse Practitioner

## 2022-09-01 ENCOUNTER — Ambulatory Visit (INDEPENDENT_AMBULATORY_CARE_PROVIDER_SITE_OTHER)
Admission: RE | Admit: 2022-09-01 | Discharge: 2022-09-01 | Disposition: A | Discharge status: Home / Self Care | Payer: BC Managed Care – PPO | Source: Ambulatory Visit | Attending: Nurse Practitioner | Admitting: Nurse Practitioner

## 2022-09-01 VITALS — BP 118/72 | HR 68 | Ht 69.0 in | Wt 210.0 lb

## 2022-09-01 DIAGNOSIS — L851 Acquired keratosis [keratoderma] palmaris et plantaris: Secondary | ICD-10-CM | POA: Insufficient documentation

## 2022-09-01 DIAGNOSIS — M791 Myalgia, unspecified site: Secondary | ICD-10-CM | POA: Diagnosis not present

## 2022-09-01 DIAGNOSIS — R052 Subacute cough: Secondary | ICD-10-CM

## 2022-09-01 DIAGNOSIS — D1801 Hemangioma of skin and subcutaneous tissue: Secondary | ICD-10-CM | POA: Insufficient documentation

## 2022-09-01 DIAGNOSIS — U071 COVID-19: Secondary | ICD-10-CM

## 2022-09-01 DIAGNOSIS — R509 Fever, unspecified: Secondary | ICD-10-CM

## 2022-09-01 DIAGNOSIS — L814 Other melanin hyperpigmentation: Secondary | ICD-10-CM | POA: Insufficient documentation

## 2022-09-01 DIAGNOSIS — L821 Other seborrheic keratosis: Secondary | ICD-10-CM | POA: Insufficient documentation

## 2022-09-01 LAB — POC COVID19 BINAXNOW: SARS Coronavirus 2 Ag: POSITIVE — AB

## 2022-09-01 MED ORDER — LEVOCETIRIZINE DIHYDROCHLORIDE 5 MG PO TABS: 5.0000 mg | ORAL_TABLET | Freq: Every evening | ORAL | 0 refills | Status: DC

## 2022-09-01 MED ORDER — BENZONATATE 200 MG PO CAPS: 200.0000 mg | ORAL_CAPSULE | Freq: Three times a day (TID) | ORAL | 0 refills | Status: DC | PRN

## 2022-09-01 NOTE — Assessment & Plan Note (Signed)
Can use over-the-counter analgesics as needed COVID test positive in office.

## 2022-09-01 NOTE — Telephone Encounter (Signed)
Patient called in and stated that he seen [facility] today and he was suppose to send in an antibiotic for him. He went to the pharmacy and they didn't have one for him. Please advise. Thank you!

## 2022-09-01 NOTE — Progress Notes (Signed)
Acute Office Visit  Subjective:     Patient ID: [name], male    DOB: 05-08-1976, 47 y.o.   MRN: [id number]  Chief Complaint  Patient presents with   Cough    X6w, concerned about bronchitis; denies fever/chills/wheeze/shob     Patient is in today for cogh  States that daughter was born on 07/25/2022. States that he started feeling poor on 07/27/2022. States had URI symptosm  States that the sore throat and drainage resolved. States that the cough stayed. States over the weekend he got worse with fever, nasal drainage and increasing cough States that he has been using tessalon perles and ricola.  States that he did covid test in the beginning of the illness they were negative  Has been vaccined for covid, has not had the latest booster Flu vaccine: up to date  Review of Systems  Constitutional:  Positive for malaise/fatigue. Negative for chills and fever.       Appetite is ok Fluid is ok   HENT:  Positive for ear pain and nosebleeds. Negative for ear discharge and sore throat.   Respiratory:  Positive for cough, sputum production and shortness of breath (with talking and doing).   Gastrointestinal:  Positive for diarrhea. Negative for abdominal pain, nausea and vomiting.  Musculoskeletal:  Positive for myalgias.  Neurological:  Positive for headaches (with cough).        Objective:    BP 118/72   Pulse 68   Ht '5\' 9"'$  (1.753 m)   Wt 210 lb (95.3 kg)   SpO2 96%   BMI 31.01 kg/m    Physical Exam Vitals and nursing note reviewed.  Constitutional:      Appearance: Normal appearance.  HENT:     Right Ear: Tympanic membrane, ear canal and external ear normal.     Left Ear: Tympanic membrane, ear canal and external ear normal.     Nose:     Right Sinus: No maxillary sinus tenderness or frontal sinus tenderness.     Left Sinus: No maxillary sinus tenderness or frontal sinus tenderness.     Mouth/Throat:     Mouth: Mucous membranes are moist.     Pharynx:  Oropharynx is clear.  Cardiovascular:     Rate and Rhythm: Normal rate and regular rhythm.     Heart sounds: Normal heart sounds.  Pulmonary:     Breath sounds: Normal breath sounds.  Lymphadenopathy:     Cervical: No cervical adenopathy.  Neurological:     Mental Status: He is alert.     Results for orders placed or performed in visit on 09/01/22  POC COVID-19  Result Value Ref Range   SARS Coronavirus 2 Ag Positive (A) Negative        Assessment & Plan:   Problem List Items Addressed This Visit       Other   Cough    Pending chest x-ray Tessalon Perles 200 mg 3 times daily as needed.  Also send in some fluticasone and levocetirizine.  Patients are experienced some nosebleed encouraged him to use nasal saline on top of fluticasone      Relevant Medications   benzonatate (TESSALON) 200 MG capsule   levocetirizine (XYZAL) 5 MG tablet   Other Relevant Orders   POC COVID-19 (Completed)   DG Chest 2 View (Completed)   Fever and chills - Primary    Over-the-counter analgesics as needed.  Rest and fluids      Relevant Orders  POC COVID-19 (Completed)   Myalgia    Can use over-the-counter analgesics as needed COVID test positive in office.      Relevant Orders   POC COVID-19 (Completed)   COVID-19    COVID test positive in office.  Did do a brief discussion about antiviral treatments that they are EUA only.  Patient politely declined use of medication.  Will send in some Tessalon Perles, levocetirizine.  Patient to continue using fluticasone nasal spray encourage patient get some nasal saline to keep passages moist.  Rest drink plenty of fluid to go over CDC guidelines/recommendations regarding self-isolation/quarantine.       Meds ordered this encounter  Medications   benzonatate (TESSALON) 200 MG capsule    Sig: Take 1 capsule (200 mg total) by mouth 3 (three) times daily as needed for cough.    Dispense:  21 capsule    Refill:  0    Order Specific Question:    Supervising Provider    Answer:   Loura Pardon A [[id number]]   levocetirizine (XYZAL) 5 MG tablet    Sig: Take 1 tablet (5 mg total) by mouth every evening.    Dispense:  30 tablet    Refill:  0    Order Specific Question:   Supervising Provider    Answer:   [name], MARNE A [[id number]]    Return if symptoms worsen or fail to improve.  Romilda Garret, NP

## 2022-09-01 NOTE — Telephone Encounter (Signed)
He was to get an antibiotic if the covid test was negative the covid test was positive. An antibiotic will not help with covid   His chest xray also looked goo. No sign of infection  If he wants one of the antiviral medications for covid that is a different story but in office he declined it

## 2022-09-01 NOTE — Assessment & Plan Note (Signed)
Pending chest x-ray Tessalon Perles 200 mg 3 times daily as needed.  Also send in some fluticasone and levocetirizine.  Patients are experienced some nosebleed encouraged him to use nasal saline on top of fluticasone

## 2022-09-01 NOTE — Patient Instructions (Signed)
Your covid test was positive. You need to quarantine through 09/02/2022. Then if you are fever free for 24 hours without the use of fever reducing medications you need to wear a mask through 09/07/2022. I will be in touch with the xray once I have it  Follow up if you do not improve

## 2022-09-01 NOTE — Assessment & Plan Note (Signed)
Over-the-counter analgesics as needed.  Rest and fluids

## 2022-09-01 NOTE — Assessment & Plan Note (Signed)
COVID test positive in office.  Did do a brief discussion about antiviral treatments that they are EUA only.  Patient politely declined use of medication.  Will send in some Tessalon Perles, levocetirizine.  Patient to continue using fluticasone nasal spray encourage patient get some nasal saline to keep passages moist.  Rest drink plenty of fluid to go over CDC guidelines/recommendations regarding self-isolation/quarantine.

## 2022-09-02 NOTE — Telephone Encounter (Signed)
Spoke with patient and advised. Also recommended OTC meds to treat symptoms such as congestion, cough, fever, body aches. Patient voiced understanding to all. Advised patient to contact us if feeling worse. Nothing further needed at this time.

## 2022-09-06 ENCOUNTER — Other Ambulatory Visit: Payer: Self-pay | Admitting: Family Medicine

## 2022-09-11 NOTE — Progress Notes (Signed)
Remote ICD transmission.   

## 2022-09-23 ENCOUNTER — Other Ambulatory Visit: Payer: Self-pay | Admitting: Nurse Practitioner

## 2022-09-23 DIAGNOSIS — R052 Subacute cough: Secondary | ICD-10-CM

## 2022-10-02 IMAGING — DX DG CERVICAL SPINE COMPLETE 4+V
5 series · 5 of 5 positions shown · non-contrast
Comparison: None Available.

CLINICAL DATA: Left shoulder and arm pain.

EXAM:
CERVICAL SPINE - COMPLETE 4+ VIEW

[cervical spine ap]
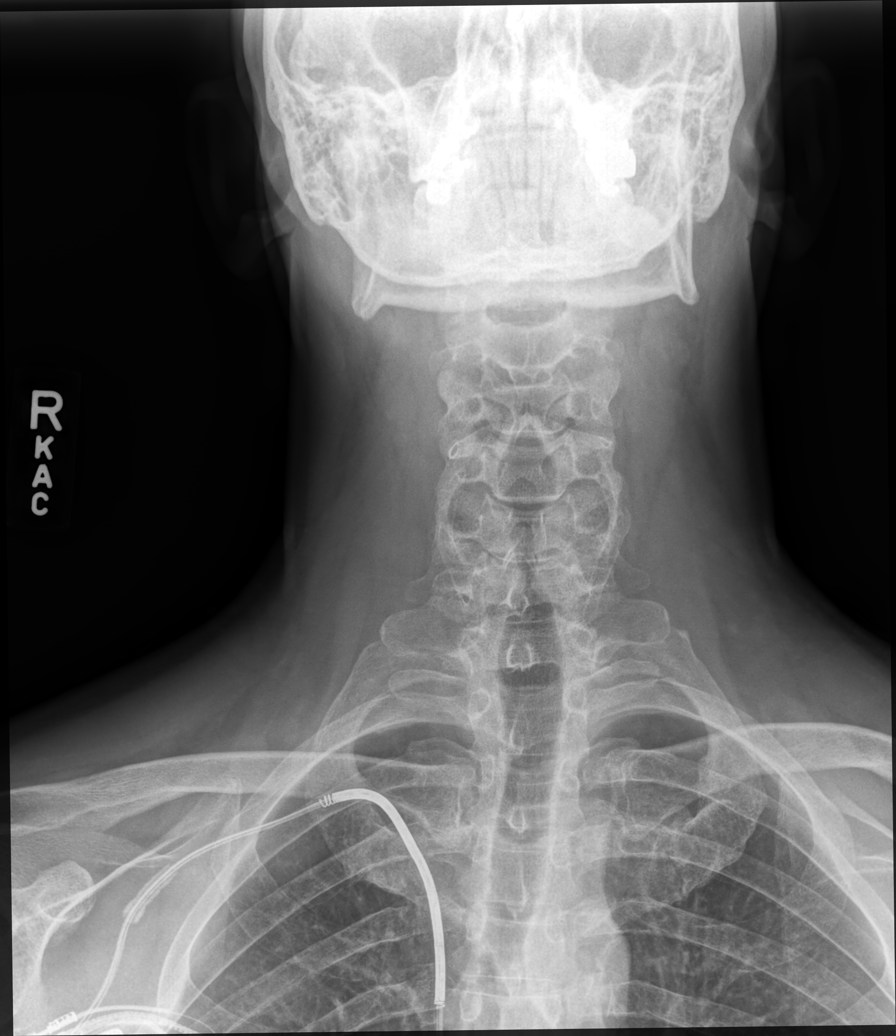

[cervical spine ap open mouth]
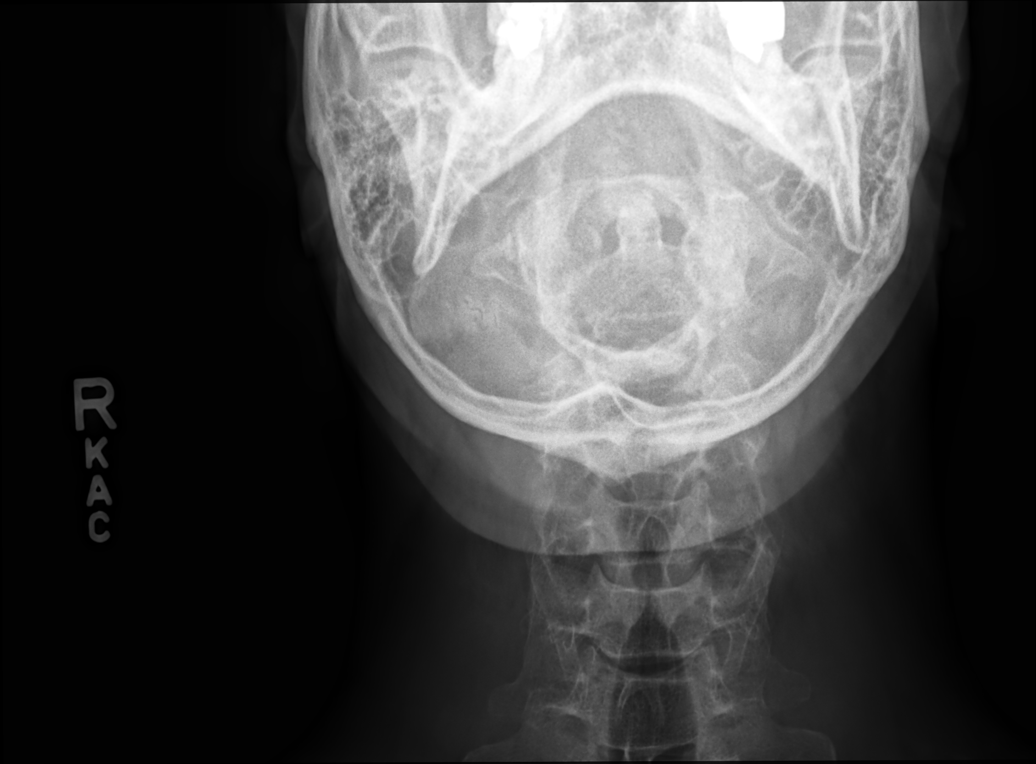

[cervical spine lat]
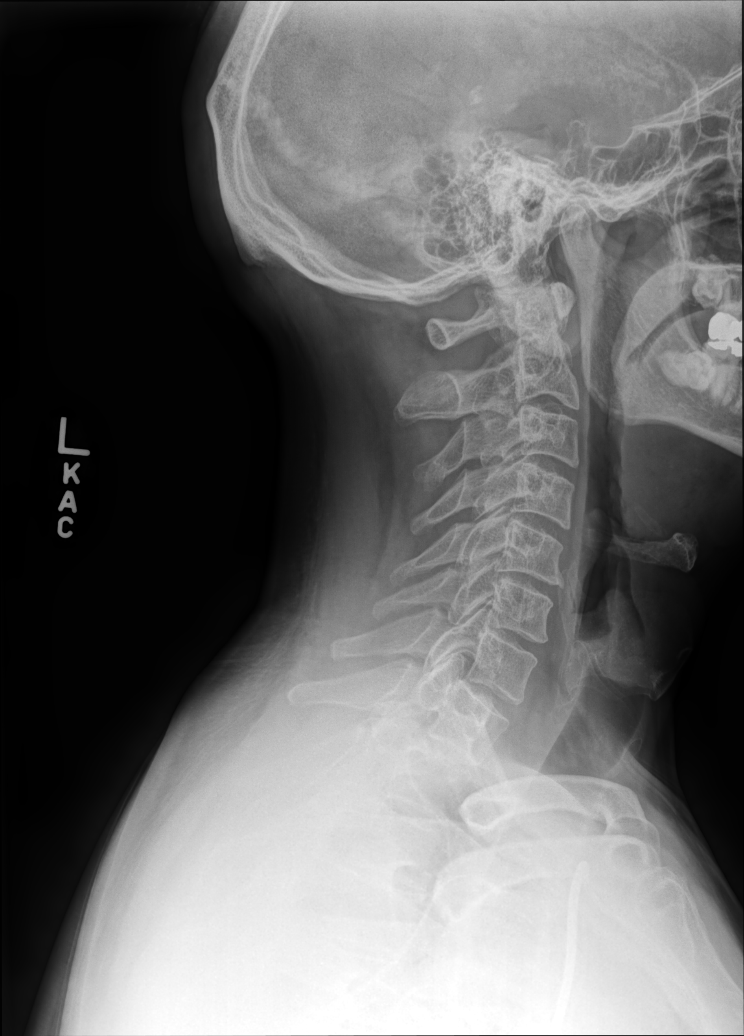

[cervical spine oblique lmo (1 of 2)]
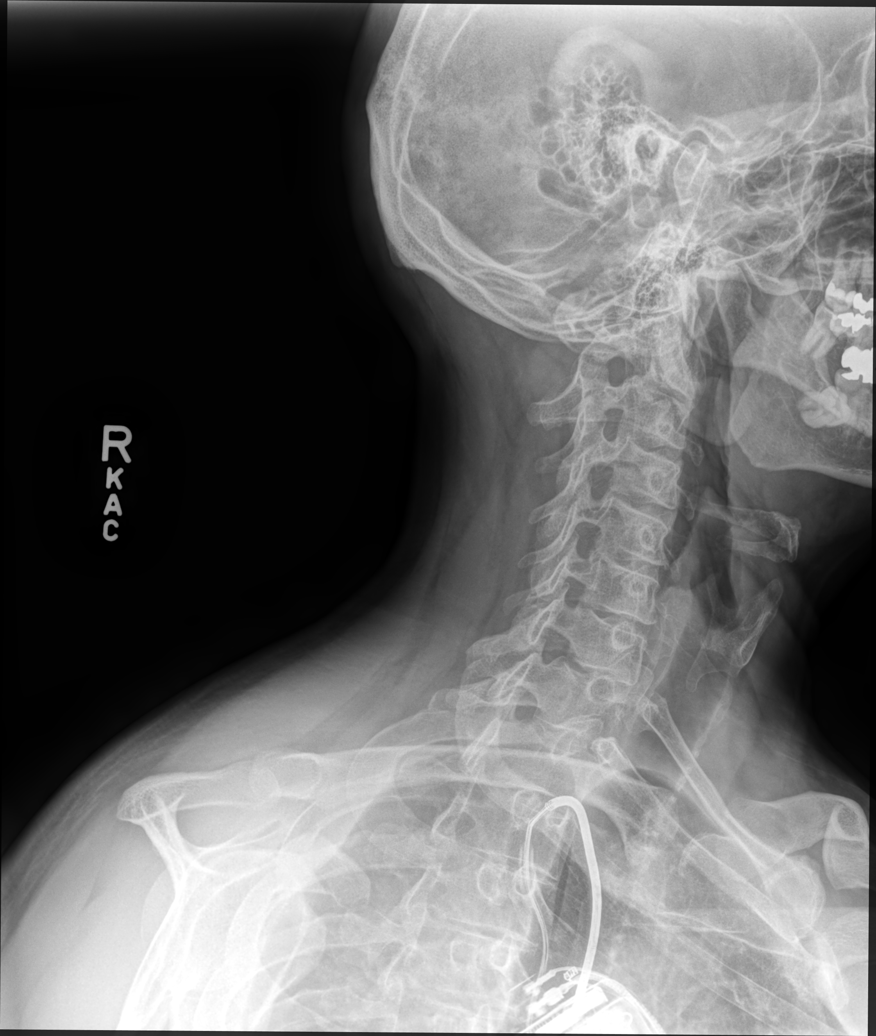

[cervical spine oblique lmo (2 of 2)]
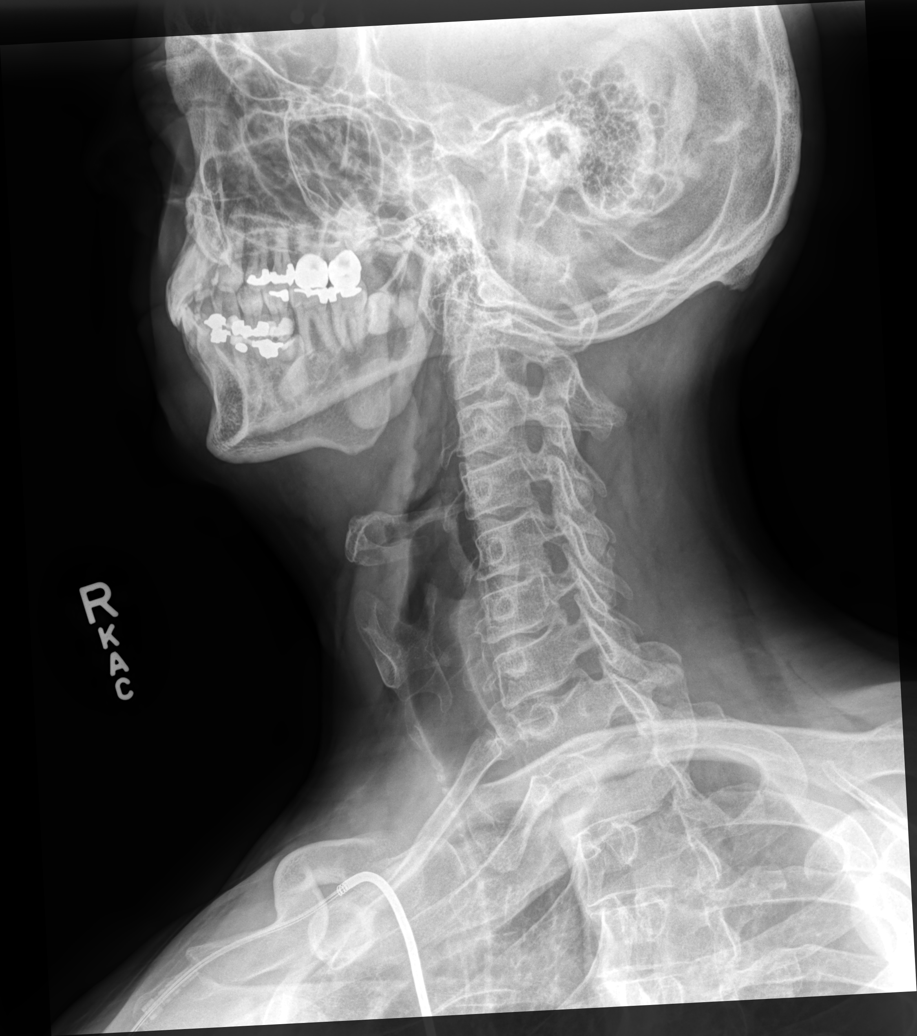

[5 of 5 positions shown; findings below may reference images not displayed]

FINDINGS: There is no evidence of cervical spine fracture or prevertebral soft
tissue swelling. Alignment is normal. No significant degenerative
joint changes are noted. No other significant bone abnormalities are
identified.
IMPRESSION: Negative cervical spine radiographs.

## 2022-10-30 ENCOUNTER — Ambulatory Visit: Payer: BC Managed Care – PPO | Admitting: Internal Medicine

## 2022-10-30 ENCOUNTER — Encounter: Payer: Self-pay | Admitting: Internal Medicine

## 2022-10-30 VITALS — BP 114/72 | HR 58 | Ht 69.0 in | Wt 211.0 lb

## 2022-10-30 DIAGNOSIS — M8080XD Other osteoporosis with current pathological fracture, unspecified site, subsequent encounter for fracture with routine healing: Secondary | ICD-10-CM | POA: Diagnosis not present

## 2022-10-30 DIAGNOSIS — M8080XS Other osteoporosis with current pathological fracture, unspecified site, sequela: Secondary | ICD-10-CM | POA: Diagnosis not present

## 2022-10-30 LAB — ALBUMIN: Albumin: 4.3 g/dL (ref 3.5–5.2)

## 2022-10-30 LAB — BASIC METABOLIC PANEL
BUN: 9 mg/dL (ref 6–23)
CO2: 30 mEq/L (ref 19–32)
Calcium: 9.8 mg/dL (ref 8.4–10.5)
Chloride: 102 mEq/L (ref 96–112)
Creatinine, Ser: 0.83 mg/dL (ref 0.40–1.50)
GFR: 106.38 mL/min (ref >60.00)
Glucose, Bld: 66 mg/dL — ABNORMAL LOW (ref 70–99)
Potassium: 4.3 mEq/L (ref 3.5–5.1)
Sodium: 139 mEq/L (ref 135–145)

## 2022-10-30 LAB — VITAMIN D 25 HYDROXY (VIT D DEFICIENCY, FRACTURES): VITD: 24.53 ng/mL — ABNORMAL LOW (ref 30.00–100.00)

## 2022-10-30 LAB — TSH: TSH: 1.63 u[IU]/mL (ref 0.35–5.50)

## 2022-10-30 LAB — T4, FREE: Free T4: 0.85 ng/dL (ref 0.60–1.60)

## 2022-10-30 MED ORDER — ALENDRONATE SODIUM 70 MG PO TABS: 70.0000 mg | ORAL_TABLET | ORAL | 3 refills | Status: DC
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
## 2022-10-30 NOTE — Patient Instructions (Signed)
Alendronate 70 mg weekly  Total calcium between 1000-1200 a day ( Citracal PLUS Multivitamin )

## 2022-10-31 LAB — PARATHYROID HORMONE, INTACT (NO CA): PTH: 35 pg/mL (ref 16–77)

## 2022-11-03 ENCOUNTER — Ambulatory Visit (INDEPENDENT_AMBULATORY_CARE_PROVIDER_SITE_OTHER): Payer: BC Managed Care – PPO

## 2022-11-03 DIAGNOSIS — I472 Ventricular tachycardia, unspecified: Secondary | ICD-10-CM

## 2022-11-04 DIAGNOSIS — R001 Bradycardia, unspecified: Secondary | ICD-10-CM | POA: Insufficient documentation

## 2022-11-05 ENCOUNTER — Ambulatory Visit: Payer: BC Managed Care – PPO | Attending: Internal Medicine | Admitting: Internal Medicine

## 2022-11-05 ENCOUNTER — Encounter: Payer: Self-pay | Admitting: Internal Medicine

## 2022-11-05 VITALS — BP 128/70 | HR 61 | Ht 69.0 in | Wt 214.0 lb

## 2022-11-05 DIAGNOSIS — I428 Other cardiomyopathies: Secondary | ICD-10-CM | POA: Diagnosis not present

## 2022-11-05 DIAGNOSIS — R001 Bradycardia, unspecified: Secondary | ICD-10-CM | POA: Diagnosis not present

## 2022-11-05 DIAGNOSIS — I472 Ventricular tachycardia, unspecified: Secondary | ICD-10-CM | POA: Diagnosis not present

## 2022-11-05 DIAGNOSIS — Z95810 Presence of automatic (implantable) cardiac defibrillator: Secondary | ICD-10-CM

## 2022-11-05 LAB — CUP PACEART REMOTE DEVICE CHECK
Battery Remaining Longevity: 117 mo
Battery Voltage: 3.03 V
Brady Statistic RV Percent Paced: 0.8 %
Date Time Interrogation Session: 20240318043623
HighPow Impedance: 51 Ohm
HighPow Impedance: 72 Ohm
Implantable Lead Connection Status: 753985
Implantable Lead Implant Date: 20071018
Implantable Lead Location: 753860
Implantable Lead Model: 6947
Implantable Pulse Generator Implant Date: 20210319
Lead Channel Impedance Value: 342 Ohm
Lead Channel Impedance Value: 399 Ohm
Lead Channel Pacing Threshold Amplitude: 0.75 V
Lead Channel Pacing Threshold Pulse Width: 0.4 ms
Lead Channel Sensing Intrinsic Amplitude: 12 mV
Lead Channel Sensing Intrinsic Amplitude: 12 mV
Lead Channel Setting Pacing Amplitude: 2 V
Lead Channel Setting Pacing Pulse Width: 0.4 ms
Lead Channel Setting Sensing Sensitivity: 1.2 mV
Zone Setting Status: 755011
Zone Setting Status: 755011

## 2022-11-05 MED ORDER — NEBIVOLOL HCL 5 MG PO TABS: 5.0000 mg | ORAL_TABLET | Freq: Every day | ORAL | 3 refills | Status: DC

## 2022-11-05 NOTE — Progress Notes (Signed)
Patient Care Team: [name], [name] Fanny, MD as PCP - General (Family Medicine) [name] Sprang, MD as PCP - Cardiology (Cardiology) [name] Sprang, MD as PCP - Electrophysiology (Cardiology)   HPI  [name] is a 47 y.o. 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    He is status post catheter ablation [facility] ).           The patient denies chest pain, shortness of breath, nocturnal dyspnea, orthopnea or peripheral edema.  There have been no palpitations, lightheadedness or syncope.     He was infected with Covid about six weeks ago. Subsequently he developed severe back pain and on 9/27 underwent a L4-5 laminectomy. His nerve pain seems to have improved but he continues to suffer from back pain.  Fatigue has been problematic, dating back to his surgery.  Some brain fog as well. Second daughter was born about December 2023.  He cut back at work a little bit and that has helped some.     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   Asthma when younger   Fatty liver    History of chicken pox    History of kidney stones    ICD (implantable cardiac defibrillator) in place    Osteopenia    Pancreatitis    Sleep apnea    uses cpap   Ventricular tachycardia ([place])    sotalol therapy;  catheter ablation at [facility] 10/10 and [facility] 2013    Past Surgical History:  Procedure Laterality Date   CARDIAC CATHETERIZATION  06/01/2006   CARDIAC DEFIBRILLATOR PLACEMENT  06/03/2006    Medtronic   CHOLECYSTECTOMY N/A 08/01/2021   Procedure: LAPAROSCOPIC CHOLECYSTECTOMY WITH INTRAOPERATIVE CHOLANGIOGRAM;  Surgeon: [name] Luna, MD;  Location: [facility];  Service: General;  Laterality: N/A;   HAND SURGERY Left    ICD GENERATOR CHANGEOUT N/A 11/04/2019   Procedure: ICD [place];  Surgeon: [name] Sprang, MD;  Location: [place] [facility] LAB;  Service: Cardiovascular;  Laterality: N/A;   IMPLANTABLE CARDIOVERTER DEFIBRILLATOR GENERATOR CHANGE N/A 12/11/2011   Procedure: IMPLANTABLE CARDIOVERTER DEFIBRILLATOR GENERATOR CHANGE;  Surgeon: [name] Sprang, MD;  Location: [facility] CATH LAB;  Service: Cardiovascular;  Laterality: N/A;   KYPHOPLASTY N/A 05/14/2021   Procedure: Lumbar Five Vertebroplasty;  Surgeon: [name] Part, MD;  Location: [place];  Service: Neurosurgery;  Laterality: N/A;   LUMBAR LAMINECTOMY/ DECOMPRESSION WITH MET-RX N/A 05/14/2021   Procedure: Lumbar Four-Five Minimally Invasive Laminectomy;  Surgeon: [name] Part, MD;  Location: [place];  Service: Neurosurgery;  Laterality: N/A;  3C/RM 19   TONSILLECTOMY     vt ablation  10/10, 10/13   x 2   WISDOM TOOTH EXTRACTION      Current Meds  Medication Sig   alendronate (FOSAMAX) 70 MG tablet Take 1 tablet (70 mg total) by mouth  once a week. Take with a full glass of water on an empty stomach.   Ascorbic Acid (VITAMIN C) 1000 MG tablet Take 1,000 mg by mouth at bedtime.   bisoprolol (ZEBETA) 5 MG tablet Take 1 tablet (5 mg total) by mouth daily.   Calcium-Phosphorus-Vitamin D (CITRACAL +D3 PO) Take 1 tablet by mouth daily.   Cholecalciferol (VITAMIN D3) 50 MCG (2000 UT) TABS Take 2,000 Units by mouth daily.   fluticasone (FLONASE) 50 MCG/ACT nasal spray Place into both nostrils daily.   KLOR-CON M10 10 MEQ tablet TAKE 4 TABLETS BY MOUTH TWICE A DAY   levocetirizine (XYZAL) 5 MG tablet Take 1 tablet (5 mg total) by mouth every evening.   magnesium oxide (MAG-OX) 400 (240 Mg) MG tablet TAKE 1 TABLET BY  MOUTH TWICE A DAY   montelukast (SINGULAIR) 10 MG tablet TAKE 1 TABLET BY MOUTH EVERY DAY   Multiple Vitamin (MULITIVITAMIN WITH MINERALS) TABS Take 1 tablet by mouth daily.   SOTALOL AF 120 MG TABS TAKE 1 & 1/2 TABLETS BY MOUTH TWICE DAILY   tadalafil (CIALIS) 5 MG tablet Take 5 mg by mouth daily as needed for erectile dysfunction.   Zinc 30 MG CAPS Take 30 mg by mouth at bedtime.    Allergies  Allergen Reactions   Bee Venom Anaphylaxis   Lidocaine     [name]      Review of Systems negative except from HPI and PMH  Physical Exam BP 128/70   Pulse 61   Ht 5\' 9"  (1.753 m)   Wt 214 lb (97.1 kg)   SpO2 98%   BMI 31.60 kg/m  Well developed and well nourished in no acute distress HENT normal E scleral and icterus clear Neck Supple JVP flat; carotids brisk and full Clear to ausculation Device pocket well healed; without hematoma or erythema.  There is no tethering  Regular rate and rhythm, no murmurs gallops or rub Soft with active bowel sounds No clubbing cyanosis  Edema Alert and oriented, grossly normal motor and sensory function Skin Warm and Dry  ECG sinus @ 61 23/10/45  Estimated Creatinine Clearance: 130.6 mL/min (by C-G formula based on SCr of 0.83 mg/dL).   Assessment and  Plan ARVC-TMEM gene Positive   VT     ICD   Medtronic    Sinus bradycardia   High Risk Medication Surveillance-sotalol   PTSD     Fatigue  AFib false detections on device    Fatigue remains the overwhelming issue.  He is cut back on work a little bit following the birth of their second daughter, [name], this is helped a little bit.  We will try a different beta-blocker, nebivolol, he has already been intolerant in the past of labetalol, metoprolol, atenolol and propranolol.   No significant interval arrhythmias.  Few nonsustained episodes.  Surveillance laboratories on sotalol were within normal limits all over his magnesium was not checked.  Will do that at the next  visit.  Has some history of edema.  If need be we will try him on spironolactone   Current medicines are reviewed at length with the patient today .  The patient does not  have concerns regarding medicines.

## 2022-11-05 NOTE — Patient Instructions (Signed)
Medication Instructions:  Your physician has recommended you make the following change in your medication:   ** Stop Bisoprolol  ** Start Nebivolol 5mg  - 1 tablet by mouth daily  *If you need a refill on your cardiac medications before your next appointment, please call your pharmacy*   Lab Work: None ordered.  If you have labs (blood work) drawn today and your tests are completely normal, you will receive your results only by: [place] (if you have MyChart) OR A paper copy in the mail If you have any lab test that is abnormal or we need to change your treatment, we will call you to review the results.   Testing/Procedures: None ordered.    Follow-Up: At [facility], you and your health needs are our priority.  As part of our continuing mission to provide you with exceptional heart care, we have created designated Provider Care Teams.  These Care Teams include your primary Cardiologist (physician) and Advanced Practice Providers (APPs -  Physician Assistants and Nurse Practitioners) who all work together to provide you with the care you need, when you need it.  We recommend signing up for the patient portal called "MyChart".  Sign up information is provided on this After Visit Summary.  MyChart is used to connect with patients for Virtual Visits (Telemedicine).  Patients are able to view lab/test results, encounter notes, upcoming appointments, etc.  Non-urgent messages can be sent to your provider as well.   To learn more about what you can do with MyChart, go to [email or web address].    Your next appointment:   6 months with Dr Caryl Comes

## 2022-12-16 NOTE — Progress Notes (Signed)
Remote ICD transmission.   

## 2023-01-05 ENCOUNTER — Other Ambulatory Visit: Payer: Self-pay

## 2023-01-05 MED ORDER — SOTALOL HCL (AF) 120 MG PO TABS: ORAL_TABLET | ORAL | 2 refills | Status: DC

## 2023-01-26 ENCOUNTER — Ambulatory Visit (INDEPENDENT_AMBULATORY_CARE_PROVIDER_SITE_OTHER)
Admission: RE | Admit: 2023-01-26 | Discharge: 2023-01-26 | Disposition: A | Discharge status: Home / Self Care | Payer: BC Managed Care – PPO | Source: Ambulatory Visit | Attending: Internal Medicine | Admitting: Internal Medicine

## 2023-01-26 ENCOUNTER — Ambulatory Visit: Payer: BC Managed Care – PPO | Admitting: Internal Medicine

## 2023-01-26 ENCOUNTER — Encounter: Payer: Self-pay | Admitting: Internal Medicine

## 2023-01-26 VITALS — BP 96/70 | HR 58 | Temp 98.1°F | Ht 69.0 in | Wt 214.0 lb

## 2023-01-26 DIAGNOSIS — S62001A Unspecified fracture of navicular [scaphoid] bone of right wrist, initial encounter for closed fracture: Secondary | ICD-10-CM

## 2023-01-26 DIAGNOSIS — M25531 Pain in right wrist: Secondary | ICD-10-CM | POA: Diagnosis not present

## 2023-01-26 DIAGNOSIS — S62009A Unspecified fracture of navicular [scaphoid] bone of unspecified wrist, initial encounter for closed fracture: Secondary | ICD-10-CM | POA: Insufficient documentation

## 2023-01-26 NOTE — Assessment & Plan Note (Signed)
Highly suspicious for fracture Will check x-ray

## 2023-01-26 NOTE — Assessment & Plan Note (Signed)
Will set up with ortho---he hopes he can use splint instead of cast Ibuprofen 800mg  tid  Ice intermittently if pain worsens

## 2023-01-26 NOTE — Progress Notes (Signed)
Subjective:    Patient ID: [name], male    DOB: 05/13/1976, 47 y.o.   MRN: [id number]  HPI Here due to right wrist injury  Was working on car--putting tires back on (turning lug nuts) Yesterday afternoon Wrist gave out---slipped off wrench (thumb got stuck on this) then he nit palm directly on ground Ibuprofen 800 takes the edge off--but the pain is severe Using splint--helps some  Is on fosamax for osteoporosis  Current Outpatient Medications on File Prior to Visit  Medication Sig Dispense Refill   alendronate (FOSAMAX) 70 MG tablet Take 1 tablet (70 mg total) by mouth once a week. Take with a full glass of water on an empty stomach. 12 tablet 3   Calcium-Phosphorus-Vitamin D (CITRACAL +D3 PO) Take 1 tablet by mouth daily.     Cholecalciferol (VITAMIN D3) 50 MCG (2000 UT) TABS Take 2,000 Units by mouth daily.     fluticasone (FLONASE) 50 MCG/ACT nasal spray Place into both nostrils daily.     KLOR-CON M10 10 MEQ tablet TAKE 4 TABLETS BY MOUTH TWICE A DAY 720 tablet 3   levocetirizine (XYZAL) 5 MG tablet Take 1 tablet (5 mg total) by mouth every evening. 30 tablet 0   magnesium oxide (MAG-OX) 400 (240 Mg) MG tablet TAKE 1 TABLET BY MOUTH TWICE A DAY 180 tablet 3   montelukast (SINGULAIR) 10 MG tablet TAKE 1 TABLET BY MOUTH EVERY DAY 90 tablet 1   Multiple Vitamin (MULITIVITAMIN WITH MINERALS) TABS Take 1 tablet by mouth daily.     nebivolol (BYSTOLIC) 5 MG tablet Take 1 tablet (5 mg total) by mouth daily. 90 tablet 3   SOTALOL AF 120 MG TABS TAKE 1 & 1/2 TABLETS BY MOUTH TWICE DAILY 270 tablet 2   tadalafil (CIALIS) 5 MG tablet Take 5 mg by mouth daily as needed for erectile dysfunction.     Zinc 30 MG CAPS Take 30 mg by mouth at bedtime.     No current facility-administered medications on file prior to visit.    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   Asthma when younger   Fatty liver    History of chicken pox    History of kidney stones    ICD (implantable cardiac defibrillator) in place    Osteopenia    Pancreatitis    Sleep apnea    uses cpap   Ventricular tachycardia (HCC)    sotalol therapy;  catheter ablation at [facility] 10/10 and [facility] 2013    Past Surgical History:  Procedure Laterality Date   CARDIAC CATHETERIZATION  06/01/2006   CARDIAC DEFIBRILLATOR PLACEMENT  06/03/2006   [employer]tr[employer]   CHOLECYSTECTOMY N/A 08/01/2021   Procedure: LAPAROSCOPIC CHOLECYSTECTOMY WITH INTRAOPERATIVE CHOLANGIOGRAM;  Surgeon: Harriette Bouillon, MD;  Location: [facility] ORS;  Service: General;  Laterality: N/A;   HAND SURGERY Left    ICD GENERATOR CHANGEOUT N/A 11/04/2019   Procedure: ICD GENERATOR CHANGEOUT;  Surgeon: [name], MD;  Location: [facility] INVASIVE CV LAB;  Service: Cardiovascular;  Laterality: N/A;   IMPLANTABLE CARDIOVERTER DEFIBRILLATOR GENERATOR CHANGE N/A 12/11/2011   Procedure: IMPLANTABLE CARDIOVERTER DEFIBRILLATOR GENERATOR CHANGE;  Surgeon: [name], MD;  Location: [facility] CATH LAB;  Service: Cardiovascular;  Laterality: N/A;  KYPHOPLASTY N/A 05/14/2021   Procedure: Lumbar Five Vertebroplasty;  Surgeon: Jadene Pierini, MD;  Location: [facility] OR;  Service: Neurosurgery;  Laterality: N/A;   LUMBAR LAMINECTOMY/ DECOMPRESSION WITH MET-RX N/A 05/14/2021   Procedure: Lumbar Four-Five Minimally Invasive Laminectomy;  Surgeon: Jadene Pierini, MD;  Location: [facility] OR;  Service: Neurosurgery;  Laterality: N/A;  3C/RM 19   TONSILLECTOMY     vt ablation  10/10, 10/13   x 2   WISDOM TOOTH EXTRACTION      Family History  Problem Relation Age of Onset   Sudden death Father    Asthma Mother    Diabetes Other    Stroke Other    Heart attack Other    Prostate cancer Other    Breast cancer Other    Ovarian cancer  Other    Uterine cancer Other    Colon cancer Other    Drug abuse Other    Depression Other    Lung cancer Maternal Grandfather    Heart disease Maternal Grandfather    Heart disease Paternal Grandfather    Hypertension Paternal Grandmother     Social History   Socioeconomic History   Marital status: Married    Spouse name: Not on file   Number of children: 0   Years of education: Not on file   Highest education level: Not on file  Occupational History   Occupation: EMT/PARAMEDIC  Tobacco Use   Smoking status: Former    Packs/day: 1.00    Years: 18.00    Additional pack years: 0.00    Total pack years: 18.00    Types: Cigarettes    Start date: 01/17/1990    Quit date: 08/19/2007    Years since quitting: 15.4   Smokeless tobacco: Never  Vaping Use   Vaping Use: Never used  Substance and Sexual Activity   Alcohol use: No    Alcohol/week: 0.0 standard drinks of alcohol   Drug use: No   Sexual activity: Yes  Other Topics Concern   Not on file  Social History Narrative   Works as a Field seismologist Determinants of Corporate investment banker Strain: Not on Ship broker Insecurity: Not on file  Transportation Needs: Not on file  Physical Activity: Not on file  Stress: Not on file  Social Connections: Not on file  Intimate Partner Violence: Not on file   Review of Systems Didn't ice it---some swelling though    Objective:   Physical Exam Constitutional:      Appearance: Normal appearance.  Musculoskeletal:     Comments: Swelling and tenderness in right wrist Metacarpals and fingers are not really tender  Neurological:     Mental Status: He is alert.            Assessment & Plan:

## 2023-01-27 ENCOUNTER — Ambulatory Visit: Payer: BC Managed Care – PPO | Admitting: Orthopaedic Surgery

## 2023-01-27 ENCOUNTER — Encounter: Payer: Self-pay | Admitting: Orthopaedic Surgery

## 2023-01-27 DIAGNOSIS — S62022A Displaced fracture of middle third of navicular [scaphoid] bone of left wrist, initial encounter for closed fracture: Secondary | ICD-10-CM

## 2023-01-27 NOTE — Progress Notes (Signed)
Office Visit Note   Patient: [name]           Date of Birth: 03-27-1976           MRN: [id number] Visit Date: 01/27/2023              Requested by: Karie Schwalbe, MD [address] [place],  [place] [address] PCP: Judy Pimple, MD   Assessment & Plan: Visit Diagnoses:  1. Closed displaced fracture of middle third of scaphoid bone of left wrist, initial encounter     Plan: Impressions 47 year old gentleman with a nondisplaced right scaphoid waist fracture.  Based on findings I have recommended referral to the hand center for surgical treatment.  Out of work note provided today.  Follow-Up Instructions: No follow-ups on file.   Orders:  No orders of the defined types were placed in this encounter.  No orders of the defined types were placed in this encounter.     Procedures: No procedures performed   Clinical Data: No additional findings.   Subjective: Chief Complaint  Patient presents with   Right Wrist - Pain    DOI 01/25/2023    HPI [name] is a 47 year old gentleman who comes in for right scaphoid fracture that he suffered on Sunday when he was working on his car.  He felt a pop in his wrist.  He works as a Veterinary surgeon for [employer].  He is currently wearing a Velcro wrist brace. Review of Systems  Constitutional: Negative.   HENT: Negative.    Eyes: Negative.   Respiratory: Negative.    Cardiovascular: Negative.   Gastrointestinal: Negative.   Endocrine: Negative.   Genitourinary: Negative.   Skin: Negative.   Allergic/Immunologic: Negative.   Neurological: Negative.   Hematological: Negative.   Psychiatric/Behavioral: Negative.    All other systems reviewed and are negative.    Objective: Vital Signs: There were no vitals taken for this visit.  Physical Exam Vitals and nursing note reviewed.  Constitutional:      Appearance: He is well-developed.  HENT:     Head: Normocephalic and atraumatic.  Eyes:      Pupils: Pupils are equal, round, and reactive to light.  Pulmonary:     Effort: Pulmonary effort is normal.  Abdominal:     Palpations: Abdomen is soft.  Musculoskeletal:        General: Normal range of motion.     Cervical back: Neck supple.  Skin:    General: Skin is warm.  Neurological:     Mental Status: He is alert and oriented to person, place, and time.  Psychiatric:        Behavior: Behavior normal.        Thought Content: Thought content normal.        Judgment: Judgment normal.    Ortho Exam Examination right wrist shows mild swelling with tenderness to the anatomic snuffbox.  No neurovascular compromise. Specialty Comments:  No specialty comments available.  Imaging: No results found.   PMFS History: Patient Active Problem List   Diagnosis Date Noted   Acute pain of right wrist 01/26/2023   Scaphoid fracture of wrist 01/26/2023   Sinus bradycardia 11/04/2022   Seborrheic keratoses 09/01/2022   Stucco keratoses 09/01/2022   Angioma of skin 09/01/2022   Lentigo 09/01/2022   Fever and chills 09/01/2022   Myalgia 09/01/2022   COVID-19 09/01/2022   Nausea and vomiting 06/03/2022   History of hypokalemia 06/03/2022  Generalized abdominal pain 06/03/2022   Right elbow pain 05/02/2022   Fatigue 05/02/2022   Serum calcium elevated 05/02/2022   Paresthesia of left arm 01/23/2022   Cough 01/01/2022   Localized osteoporosis with current pathological fracture 10/24/2021   Osteopenia 06/18/2021   Genital warts 06/03/2021   Condyloma 06/03/2021   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   Other skin changes due to chronic exposure to nonionizing radiation 06/03/2021   Lumbar radiculopathy 05/14/2021   Stress reaction 03/07/2021   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HCC) 09/14/2019   Kidney stone 06/13/2019   Wart 05/24/2019   BPH (benign prostatic hyperplasia) 07/27/2018   Elevated glucose level 04/06/2017   Routine general medical examination at a health  care facility 03/28/2016   Hypertriglyceridemia 02/21/2014   Low HDL (under 40) 02/21/2014   Vitamin D deficiency 02/21/2014   Allergic rhinitis 02/21/2014   Erectile dysfunction 08/19/2013   OSA (obstructive sleep apnea) 10/30/2011   Environmental allergies 10/27/2011   High risk medications (not anticoagulants) long-term use 10/27/2011   Hypokalemia 09/29/2011   Arrhythmogenic RV cardiomyopathy 03/04/2011   Implantable cardioverter-defibrillator (ICD) in situ 01/10/2011   PTSD (post-traumatic stress disorder) 2/2 ICD shocks 01/10/2011   Fatty liver 01/04/2009   GERD 12/05/2008   History of ventricular tachycardia 11/03/2008   ECZEMA 08/17/2007   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   Asthma when younger   Fatty liver    History of chicken pox    History of kidney stones    ICD (implantable cardiac defibrillator) in place    Osteopenia    Pancreatitis    Sleep apnea    uses cpap   Ventricular tachycardia (HCC)    sotalol therapy;  catheter ablation at [facility] 10/10 and [facility] 2013    Family History  Problem Relation Age of Onset   Sudden death Father    Asthma Mother    Diabetes Other    Stroke Other    Heart attack Other    Prostate cancer Other    Breast cancer Other    Ovarian cancer Other    Uterine cancer Other    Colon cancer Other    Drug abuse Other    Depression Other    Lung cancer Maternal Grandfather    Heart disease Maternal Grandfather    Heart disease Paternal Grandfather    Hypertension Paternal Grandmother     Past Surgical History:  Procedure Laterality Date   CARDIAC CATHETERIZATION  06/01/2006   CARDIAC DEFIBRILLATOR PLACEMENT  06/03/2006   [employer]tr[employer]   CHOLECYSTECTOMY N/A 08/01/2021   Procedure: LAPAROSCOPIC CHOLECYSTECTOMY WITH INTRAOPERATIVE CHOLANGIOGRAM;  Surgeon: Harriette Bouillon, MD;  Location: [facility]  ORS;  Service: General;  Laterality: N/A;   HAND SURGERY Left    ICD GENERATOR CHANGEOUT N/A 11/04/2019   Procedure: ICD GENERATOR CHANGEOUT;  Surgeon: [name], MD;  Location[facility] INVASIVE CV LAB;  Service: Cardiovascular;  Laterality: N/A;   IMPLANTABLE CARDIOVERTER DEFIBRILLATOR GENERATOR CHANGE N/A 12/11/2011   Procedure: IMPLANTABLE CARDIOVERTER DEFIBRILLATOR GENERATOR CHANGE;  Surgeon: [name], MD;  Location: [facility] CATH LAB;  Service: Cardiovascular;  Laterality: N/A;   KYPHOPLASTY N/A 05/14/2021   Procedure: Lumbar Five Vertebroplasty;  Surgeon: Jadene Pierini, MD;  Location: [facility] OR;  Service: Neurosurgery;  Laterality: N/A;   LUMBAR LAMINECTOMY/ DECOMPRESSION WITH MET-RX N/A 05/14/2021   Procedure: Lumbar Four-Five Minimally Invasive Laminectomy;  Surgeon: Jadene Pierini, MD;  Location: [facility] OR;  Service: Neurosurgery;  Laterality: N/A;  3C/RM 19   TONSILLECTOMY     vt ablation  10/10, 10/13   x 2   WISDOM TOOTH EXTRACTION     Social History   Occupational History   Occupation: EMT/PARAMEDIC  Tobacco Use   Smoking status: Former    Packs/day: 1.00    Years: 18.00    Additional pack years: 0.00    Total pack years: 18.00    Types: Cigarettes    Start date: 01/17/1990    Quit date: 08/19/2007    Years since quitting: 15.4   Smokeless tobacco: Never  Vaping Use   Vaping Use: Never used  Substance and Sexual Activity   Alcohol use: No    Alcohol/week: 0.0 standard drinks of alcohol   Drug use: No   Sexual activity: Yes

## 2023-01-28 ENCOUNTER — Telehealth: Payer: Self-pay | Admitting: Orthopaedic Surgery

## 2023-01-28 NOTE — Telephone Encounter (Signed)
Called patient and advised CD is ready for pick up

## 2023-01-28 NOTE — Telephone Encounter (Signed)
Pt called stating Dr Roda Shutters sent a referral for hand specialist.  Pt needs a copy of xrays on disc sometime today. Please call pt when ready for pick up at [phone].

## 2023-01-30 ENCOUNTER — Ambulatory Visit: Admit: 2023-01-30 | Payer: BC Managed Care – PPO | Admitting: Orthopaedic Surgery

## 2023-01-30 SURGERY — OPEN REDUCTION INTERNAL FIXATION (ORIF) SCAPHOID FRACTURE
Anesthesia: General | Laterality: Right

## 2023-02-02 ENCOUNTER — Ambulatory Visit (INDEPENDENT_AMBULATORY_CARE_PROVIDER_SITE_OTHER): Payer: BC Managed Care – PPO

## 2023-02-02 ENCOUNTER — Encounter: Payer: Self-pay | Admitting: Internal Medicine

## 2023-02-02 DIAGNOSIS — I472 Ventricular tachycardia, unspecified: Secondary | ICD-10-CM

## 2023-02-03 LAB — CUP PACEART REMOTE DEVICE CHECK
Battery Remaining Longevity: 114 mo
Battery Voltage: 3.03 V
Brady Statistic RV Percent Paced: 0.93 %
Date Time Interrogation Session: 20240617044223
HighPow Impedance: 51 Ohm
HighPow Impedance: 73 Ohm
Implantable Lead Connection Status: 753985
Implantable Lead Implant Date: 20071018
Implantable Lead Location: 753860
Implantable Lead Model: 6947
Implantable Pulse Generator Implant Date: 20210319
Lead Channel Impedance Value: 342 Ohm
Lead Channel Impedance Value: 399 Ohm
Lead Channel Pacing Threshold Amplitude: 0.875 V
Lead Channel Pacing Threshold Pulse Width: 0.4 ms
Lead Channel Sensing Intrinsic Amplitude: 11 mV
Lead Channel Sensing Intrinsic Amplitude: 11 mV
Lead Channel Setting Pacing Amplitude: 2 V
Lead Channel Setting Pacing Pulse Width: 0.4 ms
Lead Channel Setting Sensing Sensitivity: 1.2 mV
Zone Setting Status: 755011
Zone Setting Status: 755011
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
## 2023-02-10 ENCOUNTER — Ambulatory Visit: Payer: BC Managed Care – PPO | Admitting: Internal Medicine

## 2023-02-10 ENCOUNTER — Encounter: Payer: Self-pay | Admitting: Internal Medicine

## 2023-02-10 VITALS — BP 104/68 | HR 61 | Ht 69.0 in | Wt 210.0 lb

## 2023-02-10 DIAGNOSIS — R82998 Other abnormal findings in urine: Secondary | ICD-10-CM | POA: Diagnosis not present

## 2023-02-10 DIAGNOSIS — M8080XS Other osteoporosis with current pathological fracture, unspecified site, sequela: Secondary | ICD-10-CM | POA: Diagnosis not present

## 2023-02-10 DIAGNOSIS — M8080XD Other osteoporosis with current pathological fracture, unspecified site, subsequent encounter for fracture with routine healing: Secondary | ICD-10-CM

## 2023-02-10 LAB — COMPREHENSIVE METABOLIC PANEL
ALT: 37 U/L (ref 0–53)
AST: 26 U/L (ref 0–37)
Albumin: 4.5 g/dL (ref 3.5–5.2)
Alkaline Phosphatase: 36 U/L — ABNORMAL LOW (ref 39–117)
BUN: 9 mg/dL (ref 6–23)
CO2: 29 mEq/L (ref 19–32)
Calcium: 10 mg/dL (ref 8.4–10.5)
Chloride: 103 mEq/L (ref 96–112)
Creatinine, Ser: 0.86 mg/dL (ref 0.40–1.50)
GFR: 105.03 mL/min (ref >60.00)
Glucose, Bld: 146 mg/dL — ABNORMAL HIGH (ref 70–99)
Potassium: 4.1 mEq/L (ref 3.5–5.1)
Sodium: 139 mEq/L (ref 135–145)
Total Bilirubin: 0.5 mg/dL (ref 0.2–1.2)
Total Protein: 7.2 g/dL (ref 6.0–8.3)

## 2023-02-10 LAB — PHOSPHORUS: Phosphorus: 2.2 mg/dL — ABNORMAL LOW (ref 2.3–4.6)

## 2023-02-10 LAB — MAGNESIUM: Magnesium: 2 mg/dL (ref 1.5–2.5)

## 2023-02-10 LAB — VITAMIN D 25 HYDROXY (VIT D DEFICIENCY, FRACTURES): VITD: 34.46 ng/mL (ref 30.00–100.00)

## 2023-02-10 LAB — TSH: TSH: 1.71 u[IU]/mL (ref 0.35–5.50)

## 2023-02-10 LAB — T4, FREE: Free T4: 0.82 ng/dL (ref 0.60–1.60)

## 2023-02-10 LAB — LUTEINIZING HORMONE: LH: 2.35 m[IU]/mL (ref 1.50–9.30)

## 2023-02-10 MED ORDER — TERIPARATIDE 600 MCG/2.4ML ~~LOC~~ SOPN: 20.0000 ug | PEN_INJECTOR | Freq: Every day | SUBCUTANEOUS | 11 refills | Status: DC

## 2023-02-11 LAB — PROLACTIN: Prolactin: 6.6 ng/mL (ref 2.0–18.0)

## 2023-02-13 ENCOUNTER — Encounter: Payer: BC Managed Care – PPO | Admitting: Physician Assistant

## 2023-02-13 LAB — ALKALINE PHOSPHATASE, BONE SPECIFIC: ALKALINE PHOSPHATASE, BONE SPECIFIC: 8.3 mcg/L (ref 7.0–18.3)

## 2023-02-13 LAB — PARATHYROID HORMONE, INTACT (NO CA): PTH: 28 pg/mL (ref 16–77)

## 2023-02-13 LAB — TESTOSTERONE, TOTAL, LC/MS/MS: Testosterone, Total, LC-MS-MS: 504 ng/dL (ref 250–1100)

## 2023-02-13 NOTE — Addendum Note (Signed)
Addended by: Scarlette Shorts on: 02/13/2023 05:28 PM   Modules accepted: Orders

## 2023-02-15 ENCOUNTER — Encounter: Payer: Self-pay | Admitting: Internal Medicine

## 2023-02-15 DIAGNOSIS — M8080XS Other osteoporosis with current pathological fracture, unspecified site, sequela: Secondary | ICD-10-CM

## 2023-02-15 DIAGNOSIS — K529 Noninfective gastroenteritis and colitis, unspecified: Secondary | ICD-10-CM

## 2023-02-16 ENCOUNTER — Telehealth: Payer: Self-pay

## 2023-02-16 ENCOUNTER — Other Ambulatory Visit (HOSPITAL_COMMUNITY): Payer: Self-pay

## 2023-02-16 NOTE — Telephone Encounter (Signed)
Patient Advocate Encounter   Received notification from [facility] that prior authorization is required for Forteo  Submitted: 02/16/23 Key [id number]  Awaiting clinical questions

## 2023-02-17 ENCOUNTER — Telehealth: Payer: Self-pay | Admitting: Internal Medicine

## 2023-02-17 NOTE — Telephone Encounter (Signed)
Please contact the pharmacy and PA team to cancel Forteo  He will remain on fosamax for now    Thanks

## 2023-02-17 NOTE — Telephone Encounter (Signed)
PA Cancelled due to change in therapy

## 2023-02-24 NOTE — Progress Notes (Signed)
Remote ICD transmission.   

## 2023-02-27 ENCOUNTER — Encounter: Payer: Self-pay | Admitting: Internal Medicine

## 2023-02-28 ENCOUNTER — Other Ambulatory Visit: Payer: Self-pay

## 2023-02-28 ENCOUNTER — Inpatient Hospital Stay (HOSPITAL_COMMUNITY)
Admission: EM | Admit: 2023-02-28 | Discharge: 2023-03-02 | DRG: 309 | Disposition: A | Discharge status: Home / Self Care | Payer: BC Managed Care – PPO | Attending: Cardiology | Admitting: Cardiology

## 2023-02-28 ENCOUNTER — Emergency Department (HOSPITAL_COMMUNITY): Payer: BC Managed Care – PPO

## 2023-02-28 ENCOUNTER — Encounter (HOSPITAL_COMMUNITY): Payer: Self-pay

## 2023-02-28 DIAGNOSIS — Z800 Family history of malignant neoplasm of digestive organs: Secondary | ICD-10-CM

## 2023-02-28 DIAGNOSIS — Z79899 Other long term (current) drug therapy: Secondary | ICD-10-CM | POA: Diagnosis not present

## 2023-02-28 DIAGNOSIS — M8580 Other specified disorders of bone density and structure, unspecified site: Secondary | ICD-10-CM | POA: Diagnosis present

## 2023-02-28 DIAGNOSIS — J45909 Unspecified asthma, uncomplicated: Secondary | ICD-10-CM | POA: Diagnosis present

## 2023-02-28 DIAGNOSIS — Z818 Family history of other mental and behavioral disorders: Secondary | ICD-10-CM

## 2023-02-28 DIAGNOSIS — Z823 Family history of stroke: Secondary | ICD-10-CM | POA: Diagnosis not present

## 2023-02-28 DIAGNOSIS — Z884 Allergy status to anesthetic agent status: Secondary | ICD-10-CM

## 2023-02-28 DIAGNOSIS — Z87891 Personal history of nicotine dependence: Secondary | ICD-10-CM | POA: Diagnosis not present

## 2023-02-28 DIAGNOSIS — K760 Fatty (change of) liver, not elsewhere classified: Secondary | ICD-10-CM | POA: Diagnosis present

## 2023-02-28 DIAGNOSIS — Z8249 Family history of ischemic heart disease and other diseases of the circulatory system: Secondary | ICD-10-CM

## 2023-02-28 DIAGNOSIS — Z801 Family history of malignant neoplasm of trachea, bronchus and lung: Secondary | ICD-10-CM | POA: Diagnosis not present

## 2023-02-28 DIAGNOSIS — Z825 Family history of asthma and other chronic lower respiratory diseases: Secondary | ICD-10-CM | POA: Diagnosis not present

## 2023-02-28 DIAGNOSIS — I4729 Other ventricular tachycardia: Secondary | ICD-10-CM | POA: Diagnosis not present

## 2023-02-28 DIAGNOSIS — R000 Tachycardia, unspecified: Secondary | ICD-10-CM | POA: Diagnosis not present

## 2023-02-28 DIAGNOSIS — Z803 Family history of malignant neoplasm of breast: Secondary | ICD-10-CM | POA: Diagnosis not present

## 2023-02-28 DIAGNOSIS — G4733 Obstructive sleep apnea (adult) (pediatric): Secondary | ICD-10-CM | POA: Diagnosis present

## 2023-02-28 DIAGNOSIS — R7989 Other specified abnormal findings of blood chemistry: Secondary | ICD-10-CM | POA: Diagnosis not present

## 2023-02-28 DIAGNOSIS — Z9049 Acquired absence of other specified parts of digestive tract: Secondary | ICD-10-CM

## 2023-02-28 DIAGNOSIS — I440 Atrioventricular block, first degree: Secondary | ICD-10-CM | POA: Diagnosis present

## 2023-02-28 DIAGNOSIS — I472 Ventricular tachycardia, unspecified: Secondary | ICD-10-CM | POA: Diagnosis not present

## 2023-02-28 DIAGNOSIS — Z91030 Bee allergy status: Secondary | ICD-10-CM

## 2023-02-28 DIAGNOSIS — Z833 Family history of diabetes mellitus: Secondary | ICD-10-CM

## 2023-02-28 DIAGNOSIS — Z87442 Personal history of urinary calculi: Secondary | ICD-10-CM

## 2023-02-28 DIAGNOSIS — R578 Other shock: Secondary | ICD-10-CM | POA: Diagnosis not present

## 2023-02-28 DIAGNOSIS — Z95810 Presence of automatic (implantable) cardiac defibrillator: Secondary | ICD-10-CM

## 2023-02-28 DIAGNOSIS — I4901 Ventricular fibrillation: Secondary | ICD-10-CM | POA: Diagnosis present

## 2023-02-28 DIAGNOSIS — Z7983 Long term (current) use of bisphosphonates: Secondary | ICD-10-CM | POA: Diagnosis not present

## 2023-02-28 DIAGNOSIS — Z8042 Family history of malignant neoplasm of prostate: Secondary | ICD-10-CM

## 2023-02-28 DIAGNOSIS — Z87892 Personal history of anaphylaxis: Secondary | ICD-10-CM

## 2023-02-28 DIAGNOSIS — I428 Other cardiomyopathies: Secondary | ICD-10-CM | POA: Diagnosis present

## 2023-02-28 DIAGNOSIS — Z4502 Encounter for adjustment and management of automatic implantable cardiac defibrillator: Secondary | ICD-10-CM | POA: Diagnosis not present

## 2023-02-28 LAB — BASIC METABOLIC PANEL
Anion gap: 9 (ref 5–15)
BUN: 5 mg/dL — ABNORMAL LOW (ref 6–20)
CO2: 22 mmol/L (ref 22–32)
Calcium: 8.6 mg/dL — ABNORMAL LOW (ref 8.9–10.3)
Chloride: 109 mmol/L (ref 98–111)
Creatinine, Ser: 0.87 mg/dL (ref 0.61–1.24)
GFR, Estimated: >60 mL/min (ref >60)
Glucose, Bld: 170 mg/dL — ABNORMAL HIGH (ref 70–99)
Potassium: 4 mmol/L (ref 3.5–5.1)
Sodium: 140 mmol/L (ref 135–145)

## 2023-02-28 LAB — CBC
HCT: 41.2 % (ref 39.0–52.0)
Hemoglobin: 13.9 g/dL (ref 13.0–17.0)
MCH: 30.4 pg (ref 26.0–34.0)
MCHC: 33.7 g/dL (ref 30.0–36.0)
MCV: 90.2 fL (ref 80.0–100.0)
Platelets: 172 10*3/uL (ref 150–400)
RBC: 4.57 MIL/uL (ref 4.22–5.81)
RDW: 12.3 % (ref 11.5–15.5)
WBC: 6.8 10*3/uL (ref 4.0–10.5)
nRBC: 0 % (ref 0.0–0.2)

## 2023-02-28 LAB — PHOSPHORUS: Phosphorus: 2.3 mg/dL — ABNORMAL LOW (ref 2.5–4.6)

## 2023-02-28 LAB — TROPONIN I (HIGH SENSITIVITY)
Troponin I (High Sensitivity): 12 ng/L (ref <18)
Troponin I (High Sensitivity): 22 ng/L — ABNORMAL HIGH (ref <18)

## 2023-02-28 LAB — MAGNESIUM: Magnesium: 1.9 mg/dL (ref 1.7–2.4)

## 2023-02-28 LAB — TSH: TSH: 0.667 u[IU]/mL (ref 0.350–4.500)

## 2023-02-28 MED ORDER — LORATADINE 10 MG PO TABS
10.0000 mg | ORAL_TABLET | Freq: Every day | ORAL | Status: DC
  Administered 2023-03-01 – 2023-03-02 (×2): 10 mg via ORAL
  Filled 2023-02-28 (×2): qty 1

## 2023-02-28 MED ORDER — POTASSIUM CHLORIDE CRYS ER 20 MEQ PO TBCR
40.0000 meq | EXTENDED_RELEASE_TABLET | Freq: Two times a day (BID) | ORAL | Status: DC
  Administered 2023-03-01 – 2023-03-02 (×3): 40 meq via ORAL
  Filled 2023-02-28 (×3): qty 2

## 2023-02-28 MED ORDER — NEBIVOLOL HCL 5 MG PO TABS
5.0000 mg | ORAL_TABLET | Freq: Every day | ORAL | Status: DC
  Administered 2023-03-01 – 2023-03-02 (×2): 5 mg via ORAL
  Filled 2023-02-28 (×2): qty 1

## 2023-02-28 MED ORDER — MONTELUKAST SODIUM 10 MG PO TABS
10.0000 mg | ORAL_TABLET | Freq: Every day | ORAL | Status: DC
  Administered 2023-03-01 – 2023-03-02 (×2): 10 mg via ORAL
  Filled 2023-02-28 (×2): qty 1

## 2023-02-28 MED ORDER — SOTALOL HCL 120 MG PO TABS
180.0000 mg | ORAL_TABLET | Freq: Two times a day (BID) | ORAL | Status: DC
  Administered 2023-03-01 – 2023-03-02 (×3): 180 mg via ORAL
  Filled 2023-02-28 (×5): qty 1.5

## 2023-02-28 MED ORDER — OYSTER SHELL CALCIUM/D3 500-5 MG-MCG PO TABS
1.0000 | ORAL_TABLET | Freq: Every day | ORAL | Status: DC
  Administered 2023-03-01 – 2023-03-02 (×2): 1 via ORAL
  Filled 2023-02-28 (×2): qty 1

## 2023-02-28 MED ORDER — ZINC SULFATE 220 (50 ZN) MG PO CAPS
220.0000 mg | ORAL_CAPSULE | Freq: Every day | ORAL | Status: DC
  Administered 2023-03-01: 220 mg via ORAL
  Filled 2023-02-28: qty 1

## 2023-02-28 MED ORDER — MAGNESIUM OXIDE -MG SUPPLEMENT 400 (240 MG) MG PO TABS
400.0000 mg | ORAL_TABLET | Freq: Two times a day (BID) | ORAL | Status: DC
  Administered 2023-03-01 – 2023-03-02 (×3): 400 mg via ORAL
  Filled 2023-02-28 (×3): qty 1

## 2023-02-28 NOTE — ED Notes (Addendum)
ED TO INPATIENT HANDOFF REPORT  ED Nurse Name and Phone #: Merry Lofty [phone]  S Name/Age/Gender [name] 47 y.o. male Room/Bed: 006C/006C  Code Status   Code Status: Prior  Home/SNF/Other Home Patient oriented to: self, place, time, and situation Is this baseline? Yes   Triage Complete: Triage complete  Chief Complaint Ventricular tachycardia (HCC) [I47.20]  Triage Note At home working on computer and went to gt up sudden onset of weakness thought went into v-tach sat down said the defibrillator fired just once weakness on ems arrival the pt was pale alert and oriented.  Currently feels weak    Allergies Allergies  Allergen Reactions   Bee Venom Anaphylaxis   Lidocaine     [name]    Level of Care/Admitting Diagnosis ED Disposition     ED Disposition  Admit   Condition  --   Comment  Hospital Area: [facility] [[id number]]  Level of Care: Telemetry Cardiac [[id number]]  May admit patient to [facility] or [facility] if equivalent level of care is available:: Yes  Covid Evaluation: Asymptomatic - no recent exposure (last 10 days) testing not required  Diagnosis: Ventricular tachycardia [facility]) [[id number]]  Admitting Physician: Arlester Marker [[id number]]  Attending Physician: Arlester Marker [[id number]]  Certification:: I certify this patient will need inpatient services for at least 2 midnights  Estimated Length of Stay: 2          B Medical/Surgery History 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   Asthma when younger   Fatty liver    History of chicken pox    History of kidney stones    ICD (implantable cardiac defibrillator) in place    Osteopenia    Pancreatitis    Sleep apnea    uses cpap   Ventricular tachycardia (HCC)    sotalol therapy;  [facility]athe[facility] at [facility] 10/10 and [facility] 2013    Past Surgical History:  Procedure Laterality Date   CARDIAC CATHETERIZATION  06/01/2006   CARDIAC DEFIBRILLATOR PLACEMENT  06/03/2006   [employer]tr[employer]   CHOLECYSTECTOMY N/A 08/01/2021   Procedure: LAPAROSCOPIC CHOLECYSTECTOMY WITH INTRAOPERATIVE CHOLANGIOGRAM;  Surgeon: Harriette Bouillon, MD;  Location: [facility];  Service: General;  Laterality: N/A;   HAND SURGERY Left    ICD GENERATOR CHANGEOUT N/A 11/04/2019   Procedure: ICD GENERATOR CHANGEOUT;  Surgeon: [name], MD;  Location: [facility] INVASIVE CV LAB;  Service: Cardiovascular;  Laterality: N/A;   IMPLANTABLE CARDIOVERTER DEFIBRILLATOR GENERATOR CHANGE N/A 12/11/2011   Procedure: IMPLANTABLE CARDIOVERTER DEFIBRILLATOR GENERATOR CHANGE;  Surgeon: [name], MD;  Location: [facility] CATH LAB;  Service: Cardiovascular;  Laterality: N/A;   KYPHOPLASTY N/A 05/14/2021   Procedure: Lumbar Five Vertebroplasty;  Surgeon: Jadene Pierini, MD;  Location: [facility] OR;  Service: Neurosurgery;  Laterality: N/A;   LUMBAR LAMINECTOMY/ DECOMPRESSION WITH MET-RX N/A 05/14/2021   Procedure: Lumbar Four-Five Minimally Invasive Laminectomy;  Surgeon: Jadene Pierini, MD;  Location: [facility] OR;  Service: Neurosurgery;  Laterality: N/A;  3C/RM 19   TONSILLECTOMY     vt ablation  10/10, 10/13   x 2   WISDOM TOOTH EXTRACTION       A IV Location/Drains/Wounds Patient Lines/Drains/Airways Status     Active Line/Drains/Airways     Name Placement date Placement time Site Days   Peripheral IV 02/28/23 20 G Left  Hand 02/28/23  1838  Hand  less than 1   Peripheral IV 02/28/23 20 G Anterior;Left;Proximal Forearm 02/28/23  1950  Forearm  less than 1   Incision - 4 Ports Abdomen 08/01/21  1203  -- 576            Intake/Output Last 24 hours No intake or output data in the 24 hours ending 02/28/23 2337  Labs/Imaging Results for orders placed or performed during the hospital encounter of 02/28/23 (from the past 48 hour(s))  Basic metabolic panel     Status:  Abnormal   Collection Time: 02/28/23  6:41 PM  Result Value Ref Range   Sodium 140 135 - 145 mmol/L   Potassium 4.0 3.5 - 5.1 mmol/L    Comment: HEMOLYSIS AT THIS LEVEL MAY AFFECT RESULT   Chloride 109 98 - 111 mmol/L   CO2 22 22 - 32 mmol/L   Glucose, Bld 170 (H) 70 - 99 mg/dL    Comment: Glucose reference range applies only to samples taken after fasting for at least 8 hours.   BUN 5 (L) 6 - 20 mg/dL   Creatinine, Ser 4.16 0.61 - 1.24 mg/dL   Calcium 8.6 (L) 8.9 - 10.3 mg/dL   GFR, Estimated >60 >63 mL/min    Comment: (NOTE) Calculated using the CKD-EPI Creatinine Equation (2021)    Anion gap 9 5 - 15    Comment: Performed at [facility], [address] [place], [place] [address]  Troponin I (High Sensitivity)     Status: None   Collection Time: 02/28/23  6:41 PM  Result Value Ref Range   Troponin I (High Sensitivity) 12 <18 ng/L    Comment: (NOTE) Elevated high sensitivity troponin I (hsTnI) values and significant  changes across serial measurements may suggest ACS but many other  chronic and acute conditions are known to elevate hsTnI results.  Refer to the "Links" section for chest pain algorithms and additional  guidance. Performed at [facility], [address] [place], [place] [address]   Magnesium     Status: None   Collection Time: 02/28/23  6:41 PM  Result Value Ref Range   Magnesium 1.9 1.7 - 2.4 mg/dL    Comment: Performed at [facility], [address] [place], [place] [address]  Phosphorus     Status: Abnormal   Collection Time: 02/28/23  6:41 PM  Result Value Ref Range   Phosphorus 2.3 (L) 2.5 - 4.6 mg/dL    Comment: Performed at [facility], [address], [place], [place] [address]  TSH     Status: None   Collection Time: 02/28/23  6:41 PM  Result Value Ref Range   TSH 0.667 0.350 - 4.500 uIU/mL    Comment: Performed by a 3rd Generation assay with a functional sensitivity of <=0.01 uIU/mL. Performed at [facility], [address], [place], [place] [address]   CBC     Status: None   Collection Time: 02/28/23  7:48 PM  Result Value Ref Range   WBC 6.8 4.0 - 10.5 K/uL   RBC 4.57 4.22 - 5.81 MIL/uL   Hemoglobin 13.9 13.0 - 17.0 g/dL   HCT 37.6 28.3 - 15.1 %   MCV 90.2 80.0 - 100.0 fL   MCH 30.4 26.0 - 34.0 pg   MCHC 33.7 30.0 - 36.0 g/dL   RDW 76.1 60.7 - 37.1 %   Platelets 172 150 - 400 K/uL  nRBC 0.0 0.0 - 0.2 %    Comment: Performed at [facility], [address], [place], [place] [address]  Troponin I (High Sensitivity)     Status: Abnormal   Collection Time: 02/28/23  9:36 PM  Result Value Ref Range   Troponin I (High Sensitivity) 22 (H) <18 ng/L    Comment: (NOTE) Elevated high sensitivity troponin I (hsTnI) values and significant  changes across serial measurements may suggest ACS but many other  chronic and acute conditions are known to elevate hsTnI results.  Refer to the "Links" section for chest pain algorithms and additional  guidance. Performed at [facility], [address], [place], [place] [address]    DG Chest Port 1 View  Result Date: 02/28/2023 CLINICAL DATA:  Cardiac palpitations EXAM: PORTABLE CHEST 1 VIEW COMPARISON:  09/01/2022 FINDINGS: Cardiac shadow is at the upper limits of normal in size. Defibrillator is again seen. Lungs are clear bilaterally. No bony abnormality is noted. IMPRESSION: No active disease. Electronically Signed   By: Alcide Clever M.D.   On: 02/28/2023 19:08    Pending Labs Unresulted Labs (From admission, onward)     Start     Ordered   03/01/23 0500  Basic metabolic panel  Daily,   R      02/28/23 2337   03/01/23 0500  Magnesium  Daily,   R      02/28/23 2337   03/01/23 0500  CBC  Tomorrow morning,   R        02/28/23 2337            Vitals/Pain Today's Vitals   02/28/23 2145 02/28/23 2215 02/28/23 2230 02/28/23 2245  BP: 103/67 100/68 (!) 106/57 97/61  Pulse: 67 78 69 60  Resp: 14 (!) 23 (!) 22 17  Temp:       TempSrc:      SpO2: 98% 97% 94% 95%  Weight:      Height:      PainSc:        Isolation Precautions No active isolations  Medications Medications  nebivolol (BYSTOLIC) tablet 5 mg (has no administration in time range)  SOTALOL AF TABS 180 mg (has no administration in time range)  Calcium-Phosphorus-Vitamin D 250-107-500 MG-MG-UNIT CHEW (has no administration in time range)  potassium chloride SA (KLOR-CON M) CR tablet 40 mEq (has no administration in time range)  magnesium oxide (MAG-OX) tablet 400 mg (has no administration in time range)  Zinc CAPS 30 mg (has no administration in time range)  loratadine (CLARITIN) tablet 10 mg (has no administration in time range)  montelukast (SINGULAIR) tablet 10 mg (has no administration in time range)    Mobility walks     Focused Assessments Cardiac Assessment Handoff:    No results found for: "CKTOTAL", "CKMB", "CKMBINDEX", "TROPONINI" No results found for: "DDIMER" Does the Patient currently have chest pain? No    R Recommendations: See Admitting Provider Note  Report given to:   Additional Notes: Pt has Defibrillator in place. Shocked once at home when patient went into V-tach. Hx of same happening. Pt reports feeling weak. No CP/SOB. A&Ox4, ambulates independently. No meds given. Trops went from 12 to 22.

## 2023-02-28 NOTE — ED Triage Notes (Signed)
At home working on computer and went to gt up sudden onset of weakness thought went into v-tach sat down said the defibrillator fired just once weakness on ems arrival the pt was pale alert and oriented.  Currently feels weak

## 2023-02-28 NOTE — H&P (Signed)
Cardiology Admission History and Physical   Patient ID: [name] MRN: [id number]; DOB: [birth year]   Admission date: 02/28/2023  PCP:  Judy Pimple, MD   [facility] Providers Cardiologist:  Sherryl Manges, MD  Electrophysiologist:  Sherryl Manges, MD       Chief Complaint:  ICD shock  Patient Profile:   [name] is a 47 y.o. 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  History of Present Illness:   Mr. [name] states at around 5:43 pm he got up to stand and while walking towards his front door to let his wife and children into his home, he suddenly noticed a fluttering in his chest.  He soon felt dizzy and weak.  Within a few seconds he felt the shock from his ICD.  Soon after noticing the shock, the feeling of fluttering in his chest and dizziness resolved.  His wife drove him to our emergency department for further evaluation.  Patient is taking 180 mg twice a day of sotalol for which he states has been taking regularly without any missed doses.  He says the last time he had an ICD shock was in 2013.  At that time, patient reports undergoing a second ablation.  At this time, he is asymptomatic.  He denies any recent issues of chest pain, shortness of breath, or fainting.  He has noticed periodic swelling in his feet when he works nights for which he is up on his feet most of the day.    When patient first arrived, his ICD device was interrogated.  It appears at around 5:44 pm, he had a tachyarrhythmia reported as ventricular fibrillation.  Further investigation of the EGM makes ventricular tachycardia more likely, but patient has only a single lead ICD.  No other sustained or non-sustained tachyarrhythmias since  5:44 pm today.      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   Asthma when younger   Fatty liver    History of chicken pox    History of kidney stones    ICD (implantable cardiac defibrillator) in place    Osteopenia    Pancreatitis    Sleep apnea    uses cpap   Ventricular tachycardia (HCC)    sotalol therapy;  catheter ablation at [facility] 10/10 and [facility] 2013    Past Surgical History:  Procedure Laterality Date   CARDIAC CATHETERIZATION  06/01/2006   CARDIAC DEFIBRILLATOR PLACEMENT  06/03/2006   Medtronic   CHOLECYSTECTOMY N/A 08/01/2021   Procedure: LAPAROSCOPIC CHOLECYSTECTOMY WITH INTRAOPERATIVE CHOLANGIOGRAM;  Surgeon: Harriette Bouillon, MD;  Location: [facility] ORS;  Service: General;  Laterality: N/A;   HAND SURGERY Left    ICD GENERATOR CHANGEOUT N/A 11/04/2019   Procedure: ICD GENERATOR CHANGEOUT;  Surgeon: [name], MD;  Location: [facility] INVASIVE CV LAB;  Service: Cardiovascular;  Laterality: N/A;   IMPLANTABLE CARDIOVERTER DEFIBRILLATOR GENERATOR CHANGE N/A 12/11/2011   Procedure: IMPLANTABLE CARDIOVERTER DEFIBRILLATOR GENERATOR CHANGE;  Surgeon: [name], MD;  Location: [facility] CATH LAB;  Service: Cardiovascular;  Laterality: N/A;   KYPHOPLASTY N/A 05/14/2021   Procedure: Lumbar Five Vertebroplasty;  Surgeon: Jadene Pierini,  MD;  Location: [facility] OR;  Service: Neurosurgery;  Laterality: N/A;   LUMBAR LAMINECTOMY/ DECOMPRESSION WITH MET-RX N/A 05/14/2021   Procedure: Lumbar Four-Five Minimally Invasive Laminectomy;  Surgeon: Jadene Pierini, MD;  Location: [facility] OR;  Service: Neurosurgery;  Laterality: N/A;  3C/RM 19   TONSILLECTOMY     vt ablation  10/10, 10/13   x 2   WISDOM TOOTH EXTRACTION       Medications Prior to Admission: Prior to Admission medications   Medication Sig Start Date End Date Taking? Authorizing Provider   alendronate (FOSAMAX) 70 MG tablet Take 70 mg by mouth once a week. 02/13/23  Yes [provider]  Calcium-Phosphorus-Vitamin D (CITRACAL +D3 PO) Take 1 tablet by mouth daily.   Yes [provider]  Cholecalciferol (VITAMIN D3) 50 MCG (2000 UT) TABS Take 2,000 Units by mouth daily.   Yes [provider]  fexofenadine (ALLEGRA) 180 MG tablet Take 180 mg by mouth daily.   Yes [provider]  KLOR-CON M10 10 MEQ tablet TAKE 4 TABLETS BY MOUTH TWICE A DAY Patient taking differently: Take 40 mEq by mouth 2 (two) times daily. 06/10/22  Yes [name], MD  magnesium oxide (MAG-OX) 400 (240 Mg) MG tablet TAKE 1 TABLET BY MOUTH TWICE A DAY 07/22/22  Yes [name], MD  montelukast (SINGULAIR) 10 MG tablet TAKE 1 TABLET BY MOUTH EVERY DAY 09/08/22  Yes [name], [name], MD  Multiple Vitamin (MULITIVITAMIN WITH MINERALS) TABS Take 1 tablet by mouth daily.   Yes [provider]  nebivolol (BYSTOLIC) 5 MG tablet Take 1 tablet (5 mg total) by mouth daily. 11/05/22  Yes [name], MD  SOTALOL AF 120 MG TABS TAKE 1 & 1/2 TABLETS BY MOUTH TWICE DAILY 01/05/23  Yes [name], MD  tadalafil (CIALIS) 5 MG tablet Take 5 mg by mouth daily as needed for erectile dysfunction.   Yes [provider]  Zinc 30 MG CAPS Take 30 mg by mouth at bedtime.   Yes [provider]     Allergies:    Allergies  Allergen Reactions   Bee Venom Anaphylaxis   Lidocaine     [name]    Social History:   Social History   Socioeconomic History   Marital status: Married    Spouse name: Not on file   Number of children: 0   Years of education: Not on file   Highest education level: Not on file  Occupational History   Occupation: EMT/PARAMEDIC  Tobacco Use   Smoking status: Former    Current packs/day: 0.00    Average packs/day: 1 pack/day for 18.0 years (18.0 ttl pk-yrs)    Types: Cigarettes    Start date: 01/17/1990    Quit date: 08/19/2007     Years since quitting: 15.5   Smokeless tobacco: Never  Vaping Use   Vaping status: Never Used  Substance and Sexual Activity   Alcohol use: No    Alcohol/week: 0.0 standard drinks of alcohol   Drug use: No   Sexual activity: Yes  Other Topics Concern   Not on file  Social History Narrative   Works as a Radiation protection practitioner   Social Determinants of Corporate investment banker Strain: Low Risk  (02/09/2021)   [facility], [facility]   Overall Financial Resource Strain (CARDIA)    Difficulty of Paying Living Expenses: Not hard at all  Food Insecurity: Low Risk  (02/16/2023)  [facility]   Food vital sign    Within the past 12 months, you worried that your food would run out before you got money to buy more: Never true    Within the past 12 months, the food you bought just didn't last and you didn't have money to get more. : Never true  Transportation Needs: No Transportation Needs (02/16/2023)   [facility]   Transportation    In the past 12 months, has lack of reliable transportation kept you from medical appointments, meetings, work or from getting things needed for daily living? : No  Physical Activity: Not on file  Stress: Not on file  Social Connections: Not on file  Intimate Partner Violence: Not on file    Family History:   The patient's family history includes Asthma in his mother; Breast cancer in an other family member; Colon cancer in an other family member; Depression in an other family member; Diabetes in an other family member; Drug abuse in an other family member; Heart attack in an other family member; Heart disease in his maternal grandfather and paternal grandfather; Hypertension in his paternal grandmother; Lung cancer in his maternal grandfather; Ovarian cancer in an other family member; Prostate cancer in an other family member; Stroke in an other family member; Sudden death in his father;  Uterine cancer in an other family member.    ROS:  Please see the history of present illness.  All other ROS reviewed and negative.     Physical Exam/Data:   Vitals:   02/28/23 2145 02/28/23 2215 02/28/23 2230 02/28/23 2245  BP: 103/67 100/68 (!) 106/57 97/61  Pulse: 67 78 69 60  Resp: 14 (!) 23 (!) 22 17  Temp:      TempSrc:      SpO2: 98% 97% 94% 95%  Weight:      Height:       No intake or output data in the 24 hours ending 02/28/23 2258    02/28/2023    6:41 PM 02/10/2023    7:50 AM 01/26/2023    2:28 PM  Last 3 Weights  Weight (lbs) 209 lb 7 oz 210 lb 214 lb  Weight (kg) 95 kg 95.255 kg 97.07 kg     Body mass index is 30.93 kg/m.  General:  Well nourished, well developed, in no acute distress HEENT: normal Neck: no JVD Vascular: No carotid bruits; Distal pulses 2+ bilaterally   Cardiac:  normal S1, S2; RRR; no murmur  Lungs:  clear to auscultation bilaterally, no wheezing, rhonchi or rales  Abd: soft, nontender, no hepatomegaly  Ext: no edema Musculoskeletal:  No deformities, BUE and BLE strength normal and equal Skin: warm and dry  Neuro:  CNs 2-12 intact, no focal abnormalities noted Psych:  Normal affect    EKG:  The ECG that was done 02/28/23 (19:18 hr) was personally reviewed and demonstrates NSR with 1st degree AV block; cannot rule out septal infarct  Relevant CV Studies: # Echocardiogram 01/07/22 IMPRESSIONS   1. Left ventricular ejection fraction, by estimation, is 60 to 65%. The  left ventricle has normal function. The left ventricle has no regional  wall motion abnormalities. Left ventricular diastolic parameters were  normal.   2. There is hypokinesis and thinning of the basal third of the right  ventricukar free wall. The apex appears to contract normally, but is  suboptimally seen. Right ventricular systolic function is mildly reduced.  The right  ventricular size is mildly  enlarged. There is normal pulmonary artery systolic pressure.   3. The  mitral valve is normal in structure. No evidence of mitral valve  regurgitation. No evidence of mitral stenosis.   4. The aortic valve is normal in structure. Aortic valve regurgitation is  not visualized. No aortic stenosis is present.   5. The inferior vena cava is normal in size with greater than 50%  respiratory variability, suggesting right atrial pressure of 3 mmHg.   Laboratory Data:  High Sensitivity Troponin:   Recent Labs  Lab 02/28/23 1841 02/28/23 2136  TROPONINIHS 12 22*      Chemistry Recent Labs  Lab 02/28/23 1841  NA 140  K 4.0  CL 109  CO2 22  GLUCOSE 170*  BUN 5*  CREATININE 0.87  CALCIUM 8.6*  MG 1.9  GFRNONAA >60  ANIONGAP 9    No results for input(s): "PROT", "ALBUMIN", "AST", "ALT", "ALKPHOS", "BILITOT" in the last 168 hours. Lipids No results for input(s): "CHOL", "TRIG", "HDL", "LABVLDL", "LDLCALC", "CHOLHDL" in the last 168 hours. Hematology Recent Labs  Lab 02/28/23 1948  WBC 6.8  RBC 4.57  HGB 13.9  HCT 41.2  MCV 90.2  MCH 30.4  MCHC 33.7  RDW 12.3  PLT 172   Thyroid  Recent Labs  Lab 02/28/23 1841  TSH 0.667   BNPNo results for input(s): "BNP", "PROBNP" in the last 168 hours.  DDimer No results for input(s): "DDIMER" in the last 168 hours.   Radiology/Studies:  DG Chest Port 1 View  Result Date: 02/28/2023 CLINICAL DATA:  Cardiac palpitations EXAM: PORTABLE CHEST 1 VIEW COMPARISON:  09/01/2022 FINDINGS: Cardiac shadow is at the upper limits of normal in size. Defibrillator is again seen. Lungs are clear bilaterally. No bony abnormality is noted. IMPRESSION: No active disease. Electronically Signed   By: Alcide Clever M.D.   On: 02/28/2023 19:08     Assessment and Plan:   Tachyarrhythmia: Patient with what appears to be likely sustained ventricular tachycardia by EGM of his device with successful 35J defibrillation at 17:44 hrs.  No obvious acute reversible cause and likely normal progression of his arrhythmogenic  cardiomyopathy.  Thyroid function and electrolytes are normal.  Patient takes sotalol 180 mg twice daily and nebivolol 5 mg dailiy.    -- Continue both sotalol and nebivolol.   -- Hold on starting another anti-arrhythmic at this time, patient is currently stable and without recurrent ventricular tachycardia on telemetry. -- Discuss case further with electrophysiology for potential management considerations.   -- Repeat echocardiogram.     Arrhythmogenic cardiomyopathy: -- Management as above.    3.   Elevated HS-troponin: Secondary to recent ICD defibrillation and only in the equivocal range.  Patient is without active symptoms consistent with acute coronary syndrome. -- No additional management changes.    Risk Assessment/Risk Scores:          Code Status: Full Code  Severity of Illness: The appropriate patient status for this patient is INPATIENT. Inpatient status is judged to be reasonable and necessary in order to provide the required intensity of service to ensure the patient's safety. The patient's presenting symptoms, physical exam findings, and initial radiographic and laboratory data in the context of their chronic comorbidities is felt to place them at high risk for further clinical deterioration. Furthermore, it is not anticipated that the patient will be medically stable for discharge from the hospital within 2 midnights of admission.   * I certify that at the  point of admission it is my clinical judgment that the patient will require inpatient hospital care spanning beyond 2 midnights from the point of admission due to high intensity of service, high risk for further deterioration and high frequency of surveillance required.*   For questions or updates, please contact [facility] Please consult [email or web address] for contact info under     Signed, Judie Grieve, MD  02/28/2023 10:58 PM

## 2023-02-28 NOTE — ED Provider Notes (Signed)
[place] EMERGENCY DEPARTMENT AT [facility] Provider Note   CSN: [id number] Arrival date & time: 02/28/23  1835     History  Chief Complaint  Patient presents with   Irregular Heart Beat    [name] is a 47 y.o. male.  The history is provided by the patient and medical records. No language interpreter was used.     47 year old male with significant history of arrhythmogenic RV cardiomyopathy with ICD in situ, anxiety, asthma, brought here via EMS with concerns of firing of his internal defibrillator.  Patient states today he was sitting when his daughter came home and got up to open the door and felt a bit lightheadedness and dizzy with heart racing.  Shortly after his defibrillator fired.  Since then he states he feels a little bit tired but no significant pain.  No fever chills or sickness.  He did mention he has had his magnesium daily dosage to cut in half due to having recurrent diarrhea.  It did help with his diarrhea but he worries that he may be low in his magnesium level.  His cardiologist is Dr. Graciela Husbands.  Patient also mention he has had several fragility fracture within the past 2 years and was concerned that the medication may contribute to that.  He reported having heart palpitation multiple times in the past which seems to improve after having 2 cardiac ablations most recent in 2013.  No other complaint no active chest pain fever chills body aches.  Home Medications Prior to Admission medications   Medication Sig Start Date End Date Taking? Authorizing Provider  Calcium-Phosphorus-Vitamin D (CITRACAL +D3 PO) Take 1 tablet by mouth daily.    [provider]  Cholecalciferol (VITAMIN D3) 50 MCG (2000 UT) TABS Take 2,000 Units by mouth daily.    [provider]  fluticasone (FLONASE) 50 MCG/ACT nasal spray Place into both nostrils daily.    [provider]  KLOR-CON M10 10 MEQ tablet TAKE 4 TABLETS BY MOUTH TWICE A DAY 06/10/22   [name], MD  levocetirizine (XYZAL) 5 MG tablet Take 1 tablet (5 mg total) by mouth every evening. 09/01/22   Eden Emms, NP  magnesium oxide (MAG-OX) 400 (240 Mg) MG tablet TAKE 1 TABLET BY MOUTH TWICE A DAY 07/22/22   [name], MD  montelukast (SINGULAIR) 10 MG tablet TAKE 1 TABLET BY MOUTH EVERY DAY 09/08/22   [name], [name], MD  Multiple Vitamin (MULITIVITAMIN WITH MINERALS) TABS Take 1 tablet by mouth daily.    [provider]  nebivolol (BYSTOLIC) 5 MG tablet Take 1 tablet (5 mg total) by mouth daily. 11/05/22   [name], MD  SOTALOL AF 120 MG TABS TAKE 1 & 1/2 TABLETS BY MOUTH TWICE DAILY 01/05/23   [name], MD  tadalafil (CIALIS) 5 MG tablet Take 5 mg by mouth daily as needed for erectile dysfunction.    [provider]  Zinc 30 MG CAPS Take 30 mg by mouth at bedtime.    [provider]      Allergies    Bee venom and Lidocaine    Review of Systems   Review of Systems  All other systems reviewed and are negative.   Physical Exam Updated Vital Signs Ht 5\' 9"  (1.753 m)   Wt 95 kg   SpO2 98%   BMI 30.93 kg/m  Physical Exam Vitals and nursing note reviewed.  Constitutional:      General:  He is not in acute distress.    Appearance: He is well-developed.  HENT:     Head: Atraumatic.  Eyes:     Conjunctiva/sclera: Conjunctivae normal.  Cardiovascular:     Rate and Rhythm: Normal rate and regular rhythm.     Pulses: Normal pulses.     Heart sounds: Normal heart sounds.  Pulmonary:     Effort: Pulmonary effort is normal.     Breath sounds: Normal breath sounds. No wheezing, rhonchi or rales.  Abdominal:     Palpations: Abdomen is soft.     Tenderness: There is no abdominal tenderness.  Musculoskeletal:     Cervical back: Neck supple.  Skin:    Findings: No rash.  Neurological:     Mental Status: He is alert.     ED Results / Procedures / Treatments   Labs (all labs ordered are listed, but only abnormal results are  displayed) Labs Reviewed  BASIC METABOLIC PANEL - Abnormal; Notable for the following components:      Result Value   Glucose, Bld 170 (*)    BUN 5 (*)    Calcium 8.6 (*)    All other components within normal limits  PHOSPHORUS - Abnormal; Notable for the following components:   Phosphorus 2.3 (*)    All other components within normal limits  MAGNESIUM  TSH  CBC  TROPONIN I (HIGH SENSITIVITY)  TROPONIN I (HIGH SENSITIVITY)    EKG EKG Interpretation Date/Time:  Saturday February 28 2023 19:18:08 EDT Ventricular Rate:  69 PR Interval:  217 QRS Duration:  115 QT Interval:  429 QTC Calculation: 460 R Axis:   -35  Text Interpretation: Sinus rhythm Prolonged PR interval Nonspecific intraventricular conduction delay Anterior infarct, old No acute changes Confirmed by Derwood Kaplan [phone]) on 02/28/2023 7:36:03 PM  Radiology DG Chest Port 1 View  Result Date: 02/28/2023 CLINICAL DATA:  Cardiac palpitations EXAM: PORTABLE CHEST 1 VIEW COMPARISON:  09/01/2022 FINDINGS: Cardiac shadow is at the upper limits of normal in size. Defibrillator is again seen. Lungs are clear bilaterally. No bony abnormality is noted. IMPRESSION: No active disease. Electronically Signed   By: Alcide Clever M.D.   On: 02/28/2023 19:08    Procedures .Critical Care  Performed by: Fayrene Helper, PA-C Authorized by: Fayrene Helper, PA-C   Critical care provider statement:    Critical care time (minutes):  30   Critical care was time spent personally by me on the following activities:  Development of treatment plan with patient or surrogate, discussions with consultants, evaluation of patient's response to treatment, examination of patient, ordering and review of laboratory studies, ordering and review of radiographic studies, ordering and performing treatments and interventions, pulse oximetry, re-evaluation of patient's condition and review of old charts     Medications Ordered in ED Medications - No data to  display  ED Course/ Medical Decision Making/ A&P                             Medical Decision Making  BP 110/66   Pulse 68   Temp 98 F (36.7 C) (Oral)   Resp (!) 21   Ht 5\' 9"  (1.753 m)   Wt 95 kg   SpO2 95%   BMI 30.93 kg/m   51:43 PM 47 year old male with significant history of arrhythmogenic RV cardiomyopathy with ICD in situ, anxiety, asthma, brought here via EMS with concerns of firing of his internal defibrillator.  Patient  states today he was sitting when his daughter came home and got up to open the door and felt a bit lightheadedness and dizzy with heart racing.  Shortly after his defibrillator fired.  Since then he states he feels a little bit tired but no significant pain.  No fever chills or sickness.  He did mention he has had his magnesium daily dosage to cut in half due to having recurrent diarrhea.  It did help with his diarrhea but he worries that he may be low in his magnesium level.  His cardiologist is Dr. Graciela Husbands.  Patient also mention he has had several fragility fracture within the past 2 years and was concerned that the medication may contribute to that.  He reported having heart palpitation multiple times in the past which seems to improve after having 2 cardiac ablations most recent in 2013.  No other complaint no active chest pain fever chills body aches.  On his, patient is resting comfortably appears to be in no acute discomfort.  Heart with normal rate and rhythm, lungs are clear to auscultation bilaterally abdomen is soft nontender intact radial pulse bilaterally.  Patient is mentating appropriately.  At this time patient is medically stable, heart rate is within normal limit, low blood pressure, no fever, no hypoxia.  Patient has a Medtronic device, it was interrogated and finding notable for 1 shock for VT/VF earlier today. Pt is currently on Sotalol.   -Labs ordered, independently viewed and interpreted by me.  Labs remarkable for normal mag.  Phos 2.3,  normal TSH, normal trop -The patient was maintained on a cardiac monitor.  I personally viewed and interpreted the cardiac monitored which showed an underlying rhythm of: NSR -Imaging independently viewed and interpreted by me and I agree with radiologist's interpretation.  Result remarkable for CXR without concerning changes -This patient presents to the ED for concern of heart palpitation, this involves an extensive number of treatment options, and is a complaint that carries with it a high risk of complications and morbidity.  The differential diagnosis includes cardiac arrhythmia, anemia, electrolytes derangement, hypovolemia, thyroiditis, acs -Co morbidities that complicate the patient evaluation includes hx of arrhythmogenic cardiomyopathy -Treatment includes monitoring and admission -Reevaluation of the patient after these medicines showed that the patient improved -PCP office notes or outside notes reviewed -Discussion with specialist cardiologist Dr. Orson Aloe who will evaluate pt in the ER and will admit -Escalation to admission/observation considered: patient is agreeable with admission  I have considered amiodarone, however cardiologist will evaluate pt and determine treatment and EP involvement.          Final Clinical Impression(s) / ED Diagnoses Final diagnoses:  VT (ventricular tachycardia) [facility])    Rx / DC Orders ED Discharge Orders     None         Fayrene Helper, PA-C 02/28/23 2051    Derwood Kaplan, MD 03/01/23 1307

## 2023-03-01 ENCOUNTER — Inpatient Hospital Stay (HOSPITAL_COMMUNITY): Payer: BC Managed Care – PPO

## 2023-03-01 DIAGNOSIS — I472 Ventricular tachycardia, unspecified: Secondary | ICD-10-CM | POA: Diagnosis not present

## 2023-03-01 DIAGNOSIS — R578 Other shock: Secondary | ICD-10-CM | POA: Diagnosis not present

## 2023-03-01 LAB — CBC
HCT: 39.8 % (ref 39.0–52.0)
Hemoglobin: 13.4 g/dL (ref 13.0–17.0)
MCH: 30.2 pg (ref 26.0–34.0)
MCHC: 33.7 g/dL (ref 30.0–36.0)
MCV: 89.6 fL (ref 80.0–100.0)
Platelets: 163 10*3/uL (ref 150–400)
RBC: 4.44 MIL/uL (ref 4.22–5.81)
RDW: 12.4 % (ref 11.5–15.5)
WBC: 8.6 10*3/uL (ref 4.0–10.5)
nRBC: 0 % (ref 0.0–0.2)

## 2023-03-01 LAB — ECHOCARDIOGRAM COMPLETE
Area-P 1/2: 2.29 cm2
Height: 69 in
S' Lateral: 3 cm
Single Plane A4C EF: 46.5 %
Weight: 3343.94 oz

## 2023-03-01 LAB — BASIC METABOLIC PANEL
Anion gap: 9 (ref 5–15)
BUN: 5 mg/dL — ABNORMAL LOW (ref 6–20)
CO2: 25 mmol/L (ref 22–32)
Calcium: 8.7 mg/dL — ABNORMAL LOW (ref 8.9–10.3)
Chloride: 105 mmol/L (ref 98–111)
Creatinine, Ser: 0.92 mg/dL (ref 0.61–1.24)
GFR, Estimated: >60 mL/min (ref >60)
Glucose, Bld: 98 mg/dL (ref 70–99)
Potassium: 3.5 mmol/L (ref 3.5–5.1)
Sodium: 139 mmol/L (ref 135–145)

## 2023-03-01 LAB — MAGNESIUM: Magnesium: 1.9 mg/dL (ref 1.7–2.4)

## 2023-03-01 LAB — MRSA NEXT GEN BY PCR, NASAL: MRSA by PCR Next Gen: NOT DETECTED

## 2023-03-01 MED ORDER — TRAZODONE HCL 50 MG PO TABS
50.0000 mg | ORAL_TABLET | Freq: Every evening | ORAL | Status: DC | PRN
  Administered 2023-03-01: 50 mg via ORAL
  Filled 2023-03-01 (×2): qty 1

## 2023-03-01 NOTE — Progress Notes (Signed)
  Echocardiogram 2D Echocardiogram has been performed.  Delcie Roch 03/01/2023, 9:33 AM

## 2023-03-01 NOTE — Plan of Care (Signed)
  Problem: Clinical Measurements: Goal: Ability to maintain clinical measurements within normal limits will improve Outcome: Progressing Goal: Respiratory complications will improve Outcome: Progressing   Problem: Activity: Goal: Risk for activity intolerance will decrease Outcome: Progressing   Problem: Nutrition: Goal: Adequate nutrition will be maintained Outcome: Progressing   Problem: Coping: Goal: Level of anxiety will decrease Outcome: Progressing   Problem: Safety: Goal: Ability to remain free from injury will improve Outcome: Progressing   

## 2023-03-01 NOTE — Consult Note (Addendum)
Electrophysiology Consultation   Patient ID: [name] MRN: [id number]; DOB: Jul 28, 1976  Admit date: 02/28/2023 Date of Consult: 03/01/2023  PCP:  [name] Pimple, MD   [facility] Providers Cardiologist:  [name] Manges, MD  Electrophysiologist:  [name] Manges, MD  {   History of Present Illness:   Mr. [name] is a pleasant 47yo man who I am seeing today for an evaluation of VT and ICD shock at the request of Dr [name]. 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 He has previously undergone 2 VT ablations. 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  He was at home and stood up to meet his daughter and felt palpitations. They did not subside and began to worsen with associated lightheadedness. He sat down anticipating a shock and then the ICD shocked him. It took a few minutes and eventually he felt his symptoms resolve. He recently reduced his magnesium to once daily from twice daily due to GI symptoms and wonders whether this could have contributed. No other changes recently. No illnesses/fevers.   Past medical, surgical, family and social history reviewed.     ROS:  Please see the history of present illness.  All other ROS reviewed and negative.     Physical Exam/Data:   Vitals:   03/01/23 0000 03/01/23 0033 03/01/23 0600 03/01/23 0747  BP: (!) 107/56 106/63 (!) 100/54 (!) 91/48  Pulse: 64 (!) 59 (!) 54 (!) 59  Resp: (!) 25 19 19    Temp:  98.6 F (37 C) 98.3 F (36.8 C) 98.6 F (37 C)  TempSrc:  Oral Oral Oral  SpO2: 97% 95% 98% 96%  Weight:  94.8 kg    Height:  5\' 9"  (1.753 m)      Intake/Output Summary (Last 24 hours) at 03/01/2023 0849 Last data filed at 03/01/2023 0745 Gross per 24 hour  Intake --  Output 110 ml  Net -110 ml      03/01/2023   12:33 AM 02/28/2023    6:41 PM 02/10/2023    7:50 AM  Last 3 Weights  Weight (lbs) 208 lb 15.9 oz 209 lb 7 oz 210 lb  Weight  (kg) 94.8 kg 95 kg 95.255 kg     Body mass index is 30.86 kg/m.   General:  Well nourished, well developed, in no acute distress. In bed at 30 degrees Cardiac:  normal S1, S2; RRR Lungs:  clear to auscultation bilaterally, no wheezing, rhonchi or rales  Psych:  Normal affect   EKG:  The EKG was personally reviewed and demonstrates:  sinus, first degree AV delay, QTc .  Telemetry:  Telemetry was personally reviewed and demonstrates:  sinus with frequent PVCs. Pleomorphic PVCs  Relevant CV Studies:  01/08/2023 Echo - EF 60, RV with thinning/hypokinesis     Assessment and Plan:   #ARVC #VT #ICD in situ #Sotalol monitoring  Mr [name] presented with ICD activation for sustained VT in setting of his known ARVC. I suspect the trigger was a recent reduction in his magnesium supplementation. He will be monitored here inpatient on tele to confirm he is now electrically quiescent. If he were to have recurrence, would consider transfer to [facility] for epi/endo repeat ablation.  No driving for 6 months.  QTc acceptable for continued sotalol use.  No room in his BP for further uptitration of his nebivolol.  Formal device interrogation pending.  For questions or updates, please contact [facility] Please consult [email or web address] for contact info under    Signed, [name] Prude, MD  03/01/2023 8:49 AM   --------------------------------------  ADDENDUM 10:02 AM  ICD interrogation received/reviewed Battery longevity 9.4 years Lead parameters stable Vs 99.1% Optivol in range VF event reviewed--rate ~ 300 bpm.  Terminated with single 36J shock. Monomorphic EGM. Clean break. No programming changes made today  [name] Lang T. Lalla Brothers, MD, [facility], [facility] Cardiac Electrophysiology

## 2023-03-02 DIAGNOSIS — I472 Ventricular tachycardia, unspecified: Secondary | ICD-10-CM | POA: Diagnosis not present

## 2023-03-02 LAB — BASIC METABOLIC PANEL
Anion gap: 12 (ref 5–15)
BUN: 6 mg/dL (ref 6–20)
CO2: 25 mmol/L (ref 22–32)
Calcium: 8.9 mg/dL (ref 8.9–10.3)
Chloride: 104 mmol/L (ref 98–111)
Creatinine, Ser: 0.85 mg/dL (ref 0.61–1.24)
GFR, Estimated: >60 mL/min (ref >60)
Glucose, Bld: 71 mg/dL (ref 70–99)
Potassium: 3.9 mmol/L (ref 3.5–5.1)
Sodium: 141 mmol/L (ref 135–145)

## 2023-03-02 LAB — MAGNESIUM: Magnesium: 1.9 mg/dL (ref 1.7–2.4)

## 2023-03-02 MED ORDER — MAGNESIUM SULFATE IN D5W 1-5 GM/100ML-% IV SOLN
1.0000 g | Freq: Once | INTRAVENOUS | Status: AC
  Administered 2023-03-02: 1 g via INTRAVENOUS
  Filled 2023-03-02: qty 100

## 2023-03-02 NOTE — TOC CM/SW Note (Signed)
Transition of Care [facility]) - Inpatient Brief Assessment   Patient Details  Name: [name] MRN: [id number] Date of Birth: September 19, 1975  [facility]) CM/SW Contact:    Harriet Masson, RN Phone Number: 03/02/2023, 12:50 PM   Clinical Narrative:  Patient admitted for Tachyarrhythmia:  Patient has ICD and is receiving iv magnesium.  TOC following.   Transition of Care Asessment: Insurance and Status: Insurance coverage has been reviewed Patient has primary care physician: Yes Home environment has been reviewed: safe to discharge home when medically stable Prior level of function:: independent Prior/Current Home Services: No current home services Social Determinants of Health Reivew: SDOH reviewed no interventions necessary Readmission risk has been reviewed: Yes Transition of care needs: no transition of care needs at this time

## 2023-03-02 NOTE — Discharge Summary (Signed)
ELECTROPHYSIOLOGY PROCEDURE DISCHARGE SUMMARY    Patient ID: [name],  MRN: [id number], DOB/AGE: [birth year] 47 y.o.  Admit date: 02/28/2023 Discharge date: 03/02/2023  Primary Care Physician: [name], [name], MD  Electrophysiologist: Dr. Graciela Husbands  Primary Discharge Diagnosis:  VT ARVC ICD therapy  Secondary Discharge Diagnosis:  OSA  Allergies  Allergen Reactions   Bee Venom Anaphylaxis   Lidocaine     [name]     Procedures This Admission:  none  Brief HPI: [name] is a 47 y.o. male was admitted after he received ICD therapy.  He reported that he just stood up to meet his daughter felt onset of palpitations. They did not subside and began to worsen with associated lightheadedness. He sat down anticipating a shock and then the ICD shocked him. He has known hx of VT with prior ablation and gene + for ARVC.  Hospital Course:  The patient's labs largely unremarkable, the only change was a week or so prior advised by his endocrinologist to reduce his magnesium supplement to daily from BID to reduce GI side effects.  His mag level was 1.9 on admission, but the only change that felt could have contributed to his VT.  He has been on sotalol chronically, his EKG/QTc stable.  He  was admitted, resumed BID dosing on his magnesium, He was observed to intermittent have frequent PVCs (pleomorphic), of these he has been aware of for some time. He had infrequent > occ NSVTs.   The patient has ambulated in the hall feels well, denies any CP or SOB.  Dr. Lalla Brothers discussed amiodarone as his next AAD option, noting baseline SB with 1st degree AVblock on telemetry made Flecainide less then desirable.  SB 50's and his BP give no room for titration of his nebivolol. He was examined by Dr. Lalla Brothers and considered stable for discharge to home.   No driving 6 months was reviewed with the patient by myself and Dr. Lalla Brothers. 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 Early follow up with Dr. Graciela Husbands is in place for 03/04/23   Physical Exam: Vitals:   03/01/23 2341 03/02/23 0429 03/02/23 0830 03/02/23 1037  BP: (!) 101/56 (!) 92/51 118/64 109/73  Pulse: 60 60 63 68  Resp: 20 16 14 15   Temp: 98.6 F (37 C) 97.8 F (36.6 C) 97.8 F (36.6 C) 98 F (36.7 C)  TempSrc: Oral Oral Oral Oral  SpO2: 93% 94% 98% 96%  Weight:      Height:        GEN- The patient is well appearing, alert and oriented x 3 today.   HEENT: normocephalic, atraumatic; sclera clear, conjunctiva pink; hearing intact; oropharynx clear Lungs-  CTA b/l, normal work of breathing.  No wheezes, rales, rhonchi Heart- RRR, no murmurs, rubs or gallops, PMI not laterally displaced GI- soft, non-tender, non-distended Extremities- no clubbing, cyanosis, or edema MS- no significant deformity or atrophy Skin- warm and dry, no rash or lesion Psych- euthymic mood, full affect Neuro- no gross defecits  Labs:   Lab Results  Component Value Date   WBC 8.6 03/01/2023   HGB 13.4 03/01/2023   HCT 39.8 03/01/2023   MCV 89.6 03/01/2023   PLT 163 03/01/2023    Recent Labs  Lab 03/02/23 0017  NA 141  K 3.9  CL 104  CO2 25  BUN 6  CREATININE 0.85  CALCIUM 8.9  GLUCOSE 71    Discharge Medications:  Allergies as of 03/02/2023       Reactions   Bee Venom Anaphylaxis   Lidocaine    [name]        Medication List     TAKE these medications    alendronate 70 MG tablet Commonly known as: FOSAMAX Take 70 mg by mouth once a week.   CITRACAL +D3 PO Take 1 tablet by mouth daily.   fexofenadine 180 MG tablet Commonly known as: ALLEGRA Take 180 mg by mouth daily.   Klor-Con M10 10 MEQ tablet Generic drug: potassium chloride TAKE 4 TABLETS BY MOUTH TWICE A DAY What changed: See the new instructions.   magnesium oxide 400 (240 Mg) MG tablet Commonly known as: MAG-OX TAKE 1 TABLET BY MOUTH TWICE A DAY    montelukast 10 MG tablet Commonly known as: SINGULAIR TAKE 1 TABLET BY MOUTH EVERY DAY   multivitamin with minerals Tabs tablet Take 1 tablet by mouth daily.   nebivolol 5 MG tablet Commonly known as: BYSTOLIC Take 1 tablet (5 mg total) by mouth daily.   SOTALOL AF 120 MG Tabs TAKE 1 & 1/2 TABLETS BY MOUTH TWICE DAILY   tadalafil 5 MG tablet Commonly known as: CIALIS Take 5 mg by mouth daily as needed for erectile dysfunction.   Vitamin D3 50 MCG (2000 UT) Tabs Take 2,000 Units by mouth daily.   Zinc 30 MG Caps Take 30 mg by mouth at bedtime.        Disposition: Home Discharge Instructions     Diet - low sodium heart healthy   Complete by: As directed    Increase activity slowly   Complete by: As directed         Duration of Discharge Encounter: Greater than 30 minutes including physician time.  Norma Fredrickson, PA-C 03/02/2023 3:05 PM

## 2023-03-02 NOTE — TOC Transition Note (Signed)
Transition of Care [facility]) - CM/SW Discharge Note   Patient Details  Name: [name] MRN: [id number] Date of Birth: 09-16-1975  Transition [facility]) CM/SW Contact:  Harriet Masson, RN Phone Number: 03/02/2023, 3:28 PM   Clinical Narrative:    Patient stable for discharge.  No TOC needs at this time.   Final next level of care: Home/Self Care Barriers to Discharge: Barriers Resolved   Patient Goals and CMS Choice    Return home  Discharge Placement    Home                     Discharge Plan and Services Additional resources added to the After Visit Summary for                                       Social Determinants of Health (SDOH) Interventions SDOH Screenings   Food Insecurity: Low Risk  (02/16/2023)   Received from [facility], [facility]  Transportation Needs: No Transportation Needs (02/16/2023)   Received from [facility], [facility]  Utilities: Low Risk  (02/16/2023)   [facility]  Depression (PHQ2-9): Low Risk  (01/26/2023)  Financial Resource Strain: Low Risk  (02/09/2021)   Received from [facility], [facility]  Tobacco Use: Medium Risk (02/28/2023)     Readmission Risk Interventions    03/02/2023    3:27 PM  Readmission Risk Prevention Plan  Post Dischage Appt Complete  Medication Screening Complete  Transportation Screening Complete

## 2023-03-02 NOTE — Progress Notes (Signed)
Mobility Specialist Progress Note:   03/02/23 1037  Therapy Vitals  Temp 98 F (36.7 C)  Temp Source Oral  Pulse Rate 68  Resp 15  BP 109/73  Patient Position (if appropriate) Sitting  Oxygen Therapy  SpO2 96 %  O2 Device Room Air  Mobility  Activity Ambulated independently in hallway  Level of Assistance Standby assist, set-up cues, supervision of patient - no hands on  Assistive Device None  Distance Ambulated (ft) 500 ft  Activity Response Tolerated well  Mobility Referral Yes  $Mobility charge 1 Mobility  Mobility Specialist Start Time (ACUTE ONLY) [id number]  Mobility Specialist Stop Time (ACUTE ONLY) 1000  Mobility Specialist Time Calculation (min) (ACUTE ONLY) 8 min    Pre Mobility: 60 HR,  97% SpO2 During Mobility: 107 HR Post Mobility:  71 HR, 95% SpO2  Pt received in bed, eager to mobility. Ambulated in hallway w/ no AD, under supervision. No c/o and asymptomatic throughout. Pt left ambulating in room with call bell near.  [place]'Vante Earlene Plater Mobility Specialist Please contact via Special educational needs teacher or Rehab office at [phone]

## 2023-03-02 NOTE — Progress Notes (Signed)
Rounding Note    Patient Name: [name] Date of Encounter: 03/02/2023  [facility] Cardiologist: Sherryl Manges, MD   Subjective   Aware of his PVCs, hesitant to to much, fearful of triggering VT  Inpatient Medications    Scheduled Meds:  calcium-vitamin D  1 tablet Oral Q breakfast   loratadine  10 mg Oral Daily   magnesium oxide  400 mg Oral BID   montelukast  10 mg Oral Daily   nebivolol  5 mg Oral Daily   potassium chloride  40 mEq Oral BID   sotalol  180 mg Oral BID   zinc sulfate  220 mg Oral QHS   Continuous Infusions:  PRN Meds: traZODone   Vital Signs    Vitals:   03/01/23 2152 03/01/23 2341 03/02/23 0429 03/02/23 0830  BP: 103/75 (!) 101/56 (!) 92/51 118/64  Pulse: 67 60 60 63  Resp: 18 20 16 14   Temp: 99.1 F (37.3 C) 98.6 F (37 C) 97.8 F (36.6 C) 97.8 F (36.6 C)  TempSrc: Oral Oral Oral Oral  SpO2: 97% 93% 94% 98%  Weight:      Height:        Intake/Output Summary (Last 24 hours) at 03/02/2023 0910 Last data filed at 03/01/2023 2342 Gross per 24 hour  Intake 360 ml  Output 1700 ml  Net -1340 ml      03/01/2023   12:33 AM 02/28/2023    6:41 PM 02/10/2023    7:50 AM  Last 3 Weights  Weight (lbs) 208 lb 15.9 oz 209 lb 7 oz 210 lb  Weight (kg) 94.8 kg 95 kg 95.255 kg      Telemetry    SB 50's, 1st degree AVblock , occ PVCs, on NSVT 17beats 130-150 - Personally Reviewed  ECG    No new EKGs - Personally Reviewed  Physical Exam   GEN: No acute distress.   Neck: No JVD Cardiac: RRR, no murmurs, rubs, or gallops.  Respiratory: CTA b/l. GI: Soft, nontender, non-distended  MS: No edema; No deformity. Neuro:  Nonfocal  Psych: Normal affect   Labs    High Sensitivity Troponin:   Recent Labs  Lab 02/28/23 1841 02/28/23 2136  TROPONINIHS 12 22*     Chemistry Recent Labs  Lab 02/28/23 1841 03/01/23 0600 03/02/23 0017  NA 140 139 141  K 4.0 3.5 3.9  CL 109 105 104  CO2 22 25 25   GLUCOSE 170* 98 71   BUN 5* 5* 6  CREATININE 0.87 0.92 0.85  CALCIUM 8.6* 8.7* 8.9  MG 1.9 1.9 1.9  GFRNONAA >60 >60 >60  ANIONGAP 9 9 12     Lipids No results for input(s): "CHOL", "TRIG", "HDL", "LABVLDL", "LDLCALC", "CHOLHDL" in the last 168 hours.  Hematology Recent Labs  Lab 02/28/23 1948 03/01/23 0600  WBC 6.8 8.6  RBC 4.57 4.44  HGB 13.9 13.4  HCT 41.2 39.8  MCV 90.2 89.6  MCH 30.4 30.2  MCHC 33.7 33.7  RDW 12.3 12.4  PLT 172 163   Thyroid  Recent Labs  Lab 02/28/23 1841  TSH 0.667    BNPNo results for input(s): "BNP", "PROBNP" in the last 168 hours.  DDimer No results for input(s): "DDIMER" in the last 168 hours.   Radiology      Cardiac Studies    03/01/23: TTE 1. Left ventricular ejection fraction, by estimation, is 60 to 65%. The  left ventricle has normal function. The left ventricle has no regional  wall motion abnormalities. Left ventricular diastolic parameters were  normal.   2. The RV apex appears hypertrabeculated.. Right ventricular systolic  function is normal. The right ventricular size is mildly enlarged. There  is normal pulmonary artery systolic pressure.   3. The mitral valve is normal in structure. Trivial mitral valve  regurgitation. No evidence of mitral stenosis.   4. The aortic valve is normal in structure. Aortic valve regurgitation is  not visualized. No aortic stenosis is present.   5. The inferior vena cava is normal in size with greater than 50%  respiratory variability, suggesting right atrial pressure of 3 mmHg.    Patient Profile     47 y.o. male w/PMHx of ARVC ((TMEM 39 gene mutation) and VT (with prior ablations) admitted with VT/ICD therapy  Assessment & Plan    #ARVC #VT #ICD in situ #Sotalol monitoring   presented with ICD therapy for sustained VT in setting of his known ARVC.  suspected trigger was a recent reduction in his magnesium supplementation.   One NSVT in last 24 hours  Baseline SB and BP will not allow titration  of his BB  One NSVT Mag has been 1.9, will give IV 1gm Continue BID PO If more, would consider transfer to [facility] for epi/endo repeat ablation.  Advised ambulation ytoday with RN/staff and see how his rhythm behaves   No driving for 6 months.   QTc was acceptable for continued sotalol use.     For questions or updates, please contact  [facility] Please consult [email or web address] for contact info under        Signed, Sheilah Pigeon, PA-C  03/02/2023, 9:10 AM

## 2023-03-02 NOTE — Plan of Care (Signed)

## 2023-03-02 NOTE — Plan of Care (Signed)

## 2023-03-02 NOTE — Discharge Instructions (Signed)
No driving 6 months

## 2023-03-03 ENCOUNTER — Telehealth: Payer: Self-pay

## 2023-03-03 NOTE — Telephone Encounter (Signed)
Pt is scheduled to see Dr Graciela Husbands 03/04/2023.

## 2023-03-03 NOTE — Transitions of Care (Post Inpatient/ED Visit) (Signed)
03/03/2023  Name: [name] MRN: [id number] DOB: [birth year]  Today's TOC FU Call Status: Today's TOC FU Call Status:: Successful TOC FU Call Competed TOC FU Call Complete Date: 03/03/23  Transition Care Management Follow-up Telephone Call Date of Discharge: 03/02/23 Discharge Facility[facility]) Type of Discharge: Inpatient Admission Primary Inpatient Discharge Diagnosis:: Ventricular tachycardia How have you been since you were released from the hospital?: Better Any questions or concerns?: No  Items Reviewed: Did you receive and understand the discharge instructions provided?: Yes Medications obtained,verified, and reconciled?: Yes (Medications Reviewed) Any new allergies since your discharge?: No Dietary orders reviewed?: Yes Do you have support at home?: Yes  Medications Reviewed Today: Medications Reviewed Today     Reviewed by Merleen Nicely, LPN (Licensed Practical Nurse) on 03/03/23 at 1145  Med List Status: <None>   Medication Order Taking? Sig Documenting Provider Last Dose Status Informant  alendronate (FOSAMAX) 70 MG tablet [id number] Yes Take 70 mg by mouth once a week. [provider] Taking Active Self, Pharmacy Records  Calcium-Phosphorus-Vitamin D (CITRACAL +D3 PO) [id number] Yes Take 1 tablet by mouth daily. [provider] Taking Active Self, Pharmacy Records  Cholecalciferol (VITAMIN D3) 50 MCG (2000 UT) TABS [id number] Yes Take 2,000 Units by mouth daily. [provider] Taking Active Self, Pharmacy Records  fexofenadine (ALLEGRA) 180 MG tablet [id number] Yes Take 180 mg by mouth daily. [provider] Taking Active Self, Pharmacy Records  KLOR-CON M10 10 MEQ tablet [id number] Yes TAKE 4 TABLETS BY MOUTH TWICE A DAY  Patient taking differently: Take 40 mEq by mouth 2 (two) times daily.   [name], MD Taking Active Self, Pharmacy Records  magnesium oxide (MAG-OX) 400 (240 Mg) MG tablet [id number] Yes TAKE 1  TABLET BY MOUTH TWICE A DAY [name], MD Taking Active Self, Pharmacy Records  montelukast (SINGULAIR) 10 MG tablet [id number] Yes TAKE 1 TABLET BY MOUTH EVERY DAY [name], [name], MD Taking Active Self, Pharmacy Records  Multiple Vitamin [facility] WITH MINERALS) TABS [id number] Yes Take 1 tablet by mouth daily. [provider] Taking Active Self, Pharmacy Records  nebivolol (BYSTOLIC) 5 MG tablet [id number] Yes Take 1 tablet (5 mg total) by mouth daily. [name], MD Taking Active Self, Pharmacy Records  SOTALOL AF 120 MG TABS [id number] Yes TAKE 1 & 1/2 TABLETS BY MOUTH TWICE DAILY [name], MD Taking Active Self, Pharmacy Records  tadalafil (CIALIS) 5 MG tablet [id number] Yes Take 5 mg by mouth daily as needed for erectile dysfunction. [provider] Taking Active Self, Pharmacy Records  Zinc 30 MG CAPS [id number] Yes Take 30 mg by mouth at bedtime. [provider] Taking Active Self, Pharmacy Records            Home Care and Equipment/Supplies: Were Home Health Services Ordered?: NA Any new equipment or medical supplies ordered?: NA  Functional Questionnaire: Do you need assistance with bathing/showering or dressing?: No Do you need assistance with meal preparation?: No Do you need assistance with eating?: No Do you have difficulty maintaining continence: No Do you need assistance with getting out of bed/getting out of a chair/moving?: No Do you have difficulty managing or taking your medications?: No  Follow up appointments reviewed: PCP Follow-up appointment confirmed?: No MD Provider Line Number:[phone] Given: Yes Specialist Hospital Follow-up appointment confirmed?: Yes Date of Specialist follow-up appointment?: 03/04/23 Follow-Up Specialty Provider:: Dr Graciela Husbands Do you need transportation to your follow-up appointment?: No Do you understand care options  if your condition(s) worsen?: Yes-patient verbalized  understanding    SIGNATURE  Woodfin Ganja LPN [facility] Nurse Health Advisor Direct Dial [phone]

## 2023-03-04 ENCOUNTER — Ambulatory Visit: Payer: BC Managed Care – PPO | Attending: Internal Medicine | Admitting: Internal Medicine

## 2023-03-04 VITALS — BP 130/70 | HR 56 | Ht 69.0 in | Wt 211.0 lb

## 2023-03-04 DIAGNOSIS — Z95810 Presence of automatic (implantable) cardiac defibrillator: Secondary | ICD-10-CM | POA: Diagnosis not present

## 2023-03-04 DIAGNOSIS — I472 Ventricular tachycardia, unspecified: Secondary | ICD-10-CM | POA: Diagnosis not present

## 2023-03-04 DIAGNOSIS — I428 Other cardiomyopathies: Secondary | ICD-10-CM

## 2023-03-04 LAB — CUP PACEART INCLINIC DEVICE CHECK
Date Time Interrogation Session: 20240717154916
Implantable Lead Connection Status: 753985
Implantable Lead Implant Date: 20071018
Implantable Lead Location: 753860
Implantable Lead Model: 6947
Implantable Pulse Generator Implant Date: 20210319

## 2023-03-04 NOTE — Patient Instructions (Signed)
Medication Instructions:  Your physician recommends that you continue on your current medications as directed. Please refer to the Current Medication list given to you today.  *If you need a refill on your cardiac medications before your next appointment, please call your pharmacy*   Lab Work: None ordered.  If you have labs (blood work) drawn today and your tests are completely normal, you will receive your results only by: MyChart Message (if you have MyChart) OR A paper copy in the mail If you have any lab test that is abnormal or we need to change your treatment, we will call you to review the results.   Testing/Procedures: None ordered.    Follow-Up: At [facility], you and your health needs are our priority.  As part of our continuing mission to provide you with exceptional heart care, we have created designated Provider Care Teams.  These Care Teams include your primary Cardiologist (physician) and Advanced Practice Providers (APPs -  Physician Assistants and Nurse Practitioners) who all work together to provide you with the care you need, when you need it.  We recommend signing up for the patient portal called "MyChart".  Sign up information is provided on this After Visit Summary.  MyChart is used to connect with patients for Virtual Visits (Telemedicine).  Patients are able to view lab/test results, encounter notes, upcoming appointments, etc.  Non-urgent messages can be sent to your provider as well.   To learn more about what you can do with MyChart, go to [email or web address].    Your next appointment:   3-4 months with Dr Graciela Husbands

## 2023-03-04 NOTE — Progress Notes (Signed)
Patient Care Team: [name], [name], MD as PCP - General (Family Medicine) [name], MD as PCP - Cardiology (Cardiology) [name], MD as PCP - Electrophysiology (Cardiology)   HPI  [name] is a 47 y.o. 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    He is status post catheter ablation [facility] ).    Recent increase in PVC burden; event recorder 2/24 about 15% (PVCs plus couplets) 2 morphologies, both within intrinsicoid deflection of about 80 ms.  Some fatigue and brain fog; beta-blocker alternatives were tried 4/24  Hospitalized 7/24 following ICD shock--apparently his endocrinologist had had him decrease his magnesium because of concerns of GI side effects preceding this event by about a week.  Chronic sotalol.  Discussions were had regarding amiodarone deferred given his young age, and augmented beta-blockers given his bradycardia   The patient denies chest pain=, shortness of breath, nocturnal dyspnea, orthopnea or peripheral edema.  There have been no syncope.  Complains of as noted above and ICD shock.  Reviewed as below.  Ventricular tachycardia.  Continues to struggle with fatigue.  Also some degree of brain fog.  Fractured his wrist and in the context of osteoporosis (?  Mechanism) is being considered for bone stimulator   Second daughter was born about December 2023.        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   Asthma when  younger   Fatty liver    History of chicken pox    History of kidney stones    ICD (implantable cardiac defibrillator) in place    Osteopenia    Pancreatitis    Sleep apnea    uses cpap   Ventricular tachycardia (HCC)    sotalol therapy;  catheter ablation at [facility] 10/10 and [facility] 2013    Past Surgical History:  Procedure Laterality Date   CARDIAC CATHETERIZATION  06/01/2006   CARDIAC DEFIBRILLATOR PLACEMENT  06/03/2006   Medtronic   CHOLECYSTECTOMY N/A 08/01/2021   Procedure: LAPAROSCOPIC CHOLECYSTECTOMY WITH INTRAOPERATIVE CHOLANGIOGRAM;  Surgeon: Harriette Bouillon, MD;  Location: [facility] ORS;  Service: General;  Laterality: N/A;   HAND SURGERY Left    ICD GENERATOR CHANGEOUT N/A 11/04/2019   Procedure: ICD GENERATOR CHANGEOUT;  Surgeon: [name], MD;  Location: [facility] INVASIVE CV LAB;  Service: Cardiovascular;  Laterality: N/A;   IMPLANTABLE CARDIOVERTER DEFIBRILLATOR GENERATOR CHANGE N/A 12/11/2011   Procedure: IMPLANTABLE CARDIOVERTER DEFIBRILLATOR GENERATOR CHANGE;  Surgeon: [name], MD;  Location: [facility] CATH LAB;  Service: Cardiovascular;  Laterality: N/A;   KYPHOPLASTY N/A 05/14/2021   Procedure: Lumbar Five Vertebroplasty;  Surgeon: Jadene Pierini, MD;  Location: [facility] OR;  Service: Neurosurgery;  Laterality: N/A;   LUMBAR LAMINECTOMY/ DECOMPRESSION WITH MET-RX N/A 05/14/2021   Procedure: Lumbar  Four-Five Minimally Invasive Laminectomy;  Surgeon: Jadene Pierini, MD;  Location: [facility] OR;  Service: Neurosurgery;  Laterality: N/A;  3C/RM 19   TONSILLECTOMY     vt ablation  10/10, 10/13   x 2   WISDOM TOOTH EXTRACTION      No outpatient medications have been marked as taking for the 03/04/23 encounter (Office Visit) with [name], MD.    Allergies  Allergen Reactions   Bee Venom Anaphylaxis   Lidocaine     [name]      Review of Systems negative except from HPI and PMH  Physical Exam BP 130/70   Pulse (!) 56   Ht 5\' 9"  (1.753 m)   Wt 211  lb (95.7 kg)   SpO2 98%   BMI 31.16 kg/m  Well developed and well nourished in no acute distress HENT normal Neck supple with JVP-flat Clear Device pocket well healed; without hematoma or erythema.  There is no tethering  Regular rate and rhythm, no  gallop No  murmur Abd-soft with active BS No Clubbing cyanosis  edema Skin-warm and dry A & Oriented  Grossly normal sensory and motor function  ECG sinus at 55 24/10/44 RSR prime  Device function is normal. Programming changes wavelet was then activated as needed with held detection inappropriately, NID's were decreased from 30/40 and 48-24/32 and 32  See Paceart for details    Estimated Creatinine Clearance: 126.6 mL/min (by C-G formula based on SCr of 0.85 mg/dL).   Assessment and  Plan ARVC-TMEM gene Positive   VT-recurrent   ICD   Medtronic    Sinus bradycardia  PVCs-frequent   High Risk Medication Surveillance-sotalol   PTSD     Fatigue  AFib false detections on device    Intercurrent ventricular tachycardia with appropriate therapy---notable in that the same morphology of ventricular tachycardia accelerated by about 100 ms going from 300 ms to about 200 ms, thankfully the wavelet which had an inhibited therapy in the VT zone did not do so in the VF zone, wavelet has been inactivated.  There has been some overall increase in PVC burden as noted above.  We unfortunately do not have the activation electrogram to know whether it is one of the 2 PVC morphologies, but we will continue to follow the PVC burden and the nonsustained VT burden.  Augmented therapy with mexiletine and/or ranolazine is potentially fraught with aggravating of his chronic GI issues.  We discussed again catheter ablation.  With his delayed intrinsicoid deflection these PVCs may be endocardial or epicardial.  Emotionally this has been very challenging, he has been reminded that he cannot drive for 6 months.  I have stressed the importance of  attending to the emotional disarray that is consequential to both the shock and the vulnerability that it unmask's and the anxiety and the disheartening that it fuels

## 2023-03-05 ENCOUNTER — Other Ambulatory Visit: Payer: Self-pay | Admitting: Family Medicine

## 2023-03-09 ENCOUNTER — Encounter: Payer: Self-pay | Admitting: Internal Medicine

## 2023-03-09 DIAGNOSIS — F4310 Post-traumatic stress disorder, unspecified: Secondary | ICD-10-CM

## 2023-03-10 ENCOUNTER — Telehealth: Payer: Self-pay | Admitting: Internal Medicine

## 2023-03-10 DIAGNOSIS — Z0279 Encounter for issue of other medical certificate: Secondary | ICD-10-CM

## 2023-03-10 NOTE — Telephone Encounter (Signed)
FMLA [employer] form and payment received. Form in Dr. Odessa Fleming box.

## 2023-03-12 ENCOUNTER — Encounter: Payer: Self-pay | Admitting: Family Medicine

## 2023-03-12 MED ORDER — ALPRAZOLAM 0.5 MG PO TABS: 0.5000 mg | ORAL_TABLET | Freq: Every evening | ORAL | 0 refills | Status: DC | PRN

## 2023-03-12 NOTE — Telephone Encounter (Signed)
Spoke with pt who reports he has received bone stimulator.  This device is something that he will apply himself at home.  Pt advised device appointment has been scheduled for 03/13/2023 at 9am with [employer] Rep to ensure there is no interference between his ICD and the bone stimulator.  Pt advised to bring entire bone stimulator and packaging. Pt verbalizes understanding and agrees with current plan. Spoke with Dr Graciela Husbands who states OK for pt to proceed.

## 2023-03-12 NOTE — Telephone Encounter (Signed)
Sent 10 xanax to get by for now  This class of med is not my favorite for panic attacks   Please schedule follow up when able to discuss a long term plan for the PTSD   Thank him for reaching out  Sorry he is going through this

## 2023-03-13 ENCOUNTER — Ambulatory Visit: Payer: BC Managed Care – PPO

## 2023-03-13 DIAGNOSIS — I472 Ventricular tachycardia, unspecified: Secondary | ICD-10-CM

## 2023-03-13 LAB — CUP PACEART INCLINIC DEVICE CHECK
Date Time Interrogation Session: 20240726131138
Implantable Lead Connection Status: 753985
Implantable Lead Implant Date: 20071018
Implantable Lead Location: 753860
Implantable Lead Model: 6947
Implantable Pulse Generator Implant Date: 20210319

## 2023-03-13 NOTE — Progress Notes (Signed)
Defib check in clinic to assess if bone simulator would interfere with device, bone simulator active for without any interference noted. Patient completed of prescribed therapy without incident.

## 2023-03-19 NOTE — Telephone Encounter (Signed)
Completed and placed with front desk for scanning and faxing.

## 2023-03-20 NOTE — Telephone Encounter (Signed)
Completed [place] [place] form scanned to chart. Billing notified. Original sent to patient.

## 2023-03-23 NOTE — Telephone Encounter (Signed)
Patient is following-up on his FMLA forms from [employer].  Patient stated there is missing information and he will need this completed by 8/12.

## 2023-03-23 NOTE — Telephone Encounter (Signed)
Spoke with pt who states he received a call regarding his FMLA paperwork from company.  Pt states he discussed this with Dr Graciela Husbands at his last office visit 03/04/2023.  Pt is unable to drive according to [place] DMV law due to ICD shock on 02/28/2023 for 6 months.  Pt states he does not have anyone to drive him to work and is requesting FMLA be continuous until 04/05/23 when his FMLA hours have been depleted.  He asks that FMLA paperwork be reviewed and redone as Dr Graciela Husbands had advised him he should seek FMLA and even disability. Pt advised Dr Graciela Husbands is out of the office until 03/30/2023 but will discuss with Dr Odessa Fleming PA and will follow up with pt after that has happened. Pt verbalizes understanding and agrees with current plan.

## 2023-03-23 NOTE — Telephone Encounter (Signed)
Spoke with pt and advised FMLA paperwork and corrections made as requested by [facility].  Placed at front desk for faxing.  Pt verbalizes understanding and thanked Charity fundraiser for the assistance.

## 2023-04-06 NOTE — Addendum Note (Signed)
Addended by: Alois Cliche on: 04/06/2023 05:48 PM   Modules accepted: Orders

## 2023-04-15 ENCOUNTER — Ambulatory Visit (INDEPENDENT_AMBULATORY_CARE_PROVIDER_SITE_OTHER): Payer: BC Managed Care – PPO | Admitting: Family Medicine

## 2023-04-15 ENCOUNTER — Encounter: Payer: Self-pay | Admitting: Family Medicine

## 2023-04-15 VITALS — BP 122/86 | HR 66 | Temp 98.4°F | Ht 69.0 in | Wt 213.5 lb

## 2023-04-15 DIAGNOSIS — F4310 Post-traumatic stress disorder, unspecified: Secondary | ICD-10-CM

## 2023-04-15 DIAGNOSIS — R058 Other specified cough: Secondary | ICD-10-CM | POA: Diagnosis not present

## 2023-04-15 DIAGNOSIS — F430 Acute stress reaction: Secondary | ICD-10-CM | POA: Diagnosis not present

## 2023-04-15 DIAGNOSIS — R052 Subacute cough: Secondary | ICD-10-CM | POA: Insufficient documentation

## 2023-04-15 MED ORDER — PREDNISONE 10 MG PO TABS: ORAL_TABLET | ORAL | 0 refills | Status: DC

## 2023-04-15 MED ORDER — BENZONATATE 200 MG PO CAPS: 200.0000 mg | ORAL_CAPSULE | Freq: Three times a day (TID) | ORAL | 1 refills | Status: DC | PRN
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
## 2023-04-15 NOTE — Assessment & Plan Note (Signed)
S/p covid early this month (was not severe) Now persistent harsh cough  Reassuring exam  Expect cyclic cough  Prescription tessalon and predinosone 30 mg taper  Over the counter med prn (with ok from cardiology) Fluids/ voice rest  Watch for worse cough/ production of colored phlegm wheeze or shortness of breath  Update if not starting to improve in a week or if worsening  Call back and Er precautions noted in detail today

## 2023-04-15 NOTE — Assessment & Plan Note (Signed)
Has appt upcoming for mental health counseling

## 2023-04-15 NOTE — Patient Instructions (Signed)
Try and rest your voice when you can  Sip fluids   Take the tessalon pearles as needed  Take the prednisone taper   Continue over the counter medicines as needed    Update if not starting to improve in a week or if worsening

## 2023-04-15 NOTE — Assessment & Plan Note (Signed)
ICD went off last mo Some PTSD symptoms and panic attacks Has appt for [facility] counseling this fall   Cannot drive for 6 months which is upsetting

## 2023-04-28 ENCOUNTER — Encounter: Payer: Self-pay | Admitting: Internal Medicine

## 2023-04-28 ENCOUNTER — Telehealth: Payer: Self-pay | Admitting: Internal Medicine

## 2023-04-28 DIAGNOSIS — Z0279 Encounter for issue of other medical certificate: Secondary | ICD-10-CM

## 2023-04-28 NOTE — Telephone Encounter (Signed)
Hello patient came to pay for forms and they will be in [employer] box by the end of the day... Thank you!!!!!!

## 2023-05-01 NOTE — Telephone Encounter (Signed)
Forms completed and placed with front desk for scanning and to contact the patient.

## 2023-05-01 NOTE — Telephone Encounter (Signed)
Copy of forms scanned to chart and originals given to patient.  He stated that we would have to renew this evaluation every 4 weeks and that he would  bring the forms into our office each time.  Mindi Junker is aware. Billing notified of completed form.

## 2023-05-04 ENCOUNTER — Ambulatory Visit: Payer: BC Managed Care – PPO

## 2023-05-04 DIAGNOSIS — I472 Ventricular tachycardia, unspecified: Secondary | ICD-10-CM | POA: Diagnosis not present

## 2023-05-05 ENCOUNTER — Encounter: Payer: Self-pay | Admitting: Gastroenterology

## 2023-05-05 ENCOUNTER — Ambulatory Visit (INDEPENDENT_AMBULATORY_CARE_PROVIDER_SITE_OTHER): Payer: BC Managed Care – PPO | Admitting: Gastroenterology

## 2023-05-05 VITALS — BP 118/83 | HR 71 | Temp 98.4°F | Ht 69.0 in | Wt 212.1 lb

## 2023-05-05 DIAGNOSIS — R197 Diarrhea, unspecified: Secondary | ICD-10-CM | POA: Diagnosis not present

## 2023-05-05 LAB — CUP PACEART REMOTE DEVICE CHECK
Battery Remaining Longevity: 111 mo
Battery Voltage: 3.03 V
Brady Statistic RV Percent Paced: 0.92 %
Date Time Interrogation Session: 20240916033626
HighPow Impedance: 52 Ohm
HighPow Impedance: 74 Ohm
Implantable Lead Connection Status: 753985
Implantable Lead Implant Date: 20071018
Implantable Lead Location: 753860
Implantable Lead Model: 6947
Implantable Pulse Generator Implant Date: 20210319
Lead Channel Impedance Value: 304 Ohm
Lead Channel Impedance Value: 361 Ohm
Lead Channel Pacing Threshold Amplitude: 1 V
Lead Channel Pacing Threshold Pulse Width: 0.4 ms
Lead Channel Sensing Intrinsic Amplitude: 12.75 mV
Lead Channel Sensing Intrinsic Amplitude: 12.75 mV
Lead Channel Setting Pacing Amplitude: 2 V
Lead Channel Setting Pacing Pulse Width: 0.4 ms
Lead Channel Setting Sensing Sensitivity: 1.2 mV
Zone Setting Status: 755011
Zone Setting Status: 755011

## 2023-05-05 NOTE — Progress Notes (Signed)
Primary Care Physician: [name], [name], MD  Primary Gastroenterologist:  Dr. Midge Minium  Chief Complaint  Patient presents with   Diarrhea    HPI: [name] is a 47 y.o. male here who had seen me in the past for pancreatitis.  The patient has been diagnosed with osteoporosis and was sent to me for evaluation for possible malabsorption.  The patient denies any other malabsorptive symptoms such as bloating gas or abdominal pain.  The patient does have diarrhea quite often but he states he takes magnesium for his heart.  The patient has had a recent wrist fracture attributable to the osteoporosis.  The patient takes vitamin D and calcium with normal levels of both on his most recent blood work.  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     ALPRAZolam (XANAX) 0.5 MG tablet Take 1 tablet (0.5 mg total) by mouth at bedtime as needed for anxiety. 10 tablet 0   benzonatate (TESSALON) 200 MG capsule Take 1 capsule (200 mg total) by mouth 3 (three) times daily as needed for cough. Swallow whole 30 capsule 1   Calcium-Phosphorus-Vitamin D (CITRACAL +D3 PO) Take 1 tablet by mouth daily.     Cholecalciferol (VITAMIN D3) 50 MCG (2000 UT) TABS Take 2,000 Units by mouth daily.     fexofenadine (ALLEGRA) 180 MG tablet Take 180 mg by mouth daily.     KLOR-CON M10 10 MEQ tablet TAKE 4 TABLETS BY MOUTH TWICE A DAY (Patient  taking differently: Take 40 mEq by mouth 2 (two) times daily.) 720 tablet 3   magnesium oxide (MAG-OX) 400 (240 Mg) MG tablet TAKE 1 TABLET BY MOUTH TWICE A DAY 180 tablet 3   montelukast (SINGULAIR) 10 MG tablet TAKE 1 TABLET BY MOUTH EVERY DAY 90 tablet 0   Multiple Vitamin (MULITIVITAMIN WITH MINERALS) TABS Take 1 tablet by mouth daily.     nebivolol (BYSTOLIC) 5 MG tablet Take 1 tablet (5 mg total) by mouth daily. 90 tablet 3   predniSONE (DELTASONE) 10 MG tablet Take 3 pills once daily by mouth for 3 days, then 2 pills once daily for 3 days, then 1 pill once daily for 3 days and then stop 18 tablet 0   SOTALOL AF 120 MG TABS TAKE 1 & 1/2 TABLETS BY MOUTH TWICE DAILY 270 tablet 2   tadalafil (CIALIS) 5 MG tablet Take 5 mg by mouth daily as needed for erectile dysfunction.     Zinc 30 MG CAPS Take 30 mg by mouth at bedtime.     No current facility-administered medications for this visit.    Allergies as of 05/05/2023 - Review Complete 05/05/2023  Allergen Reaction Noted   Bee venom Anaphylaxis 02/21/2014   Lidocaine  12/04/2011    ROS:  General: Negative for anorexia,  weight loss, fever, chills, fatigue, weakness. ENT: Negative for hoarseness, difficulty swallowing , nasal congestion. CV: Negative for chest pain, angina, palpitations, dyspnea on exertion, peripheral edema.  Respiratory: Negative for dyspnea at rest, dyspnea on exertion, cough, sputum, wheezing.  GI: See history of present illness. GU:  Negative for dysuria, hematuria, urinary incontinence, urinary frequency, nocturnal urination.  Endo: Negative for unusual weight change.    Physical Examination:   BP 118/83   Pulse 71   Temp 98.4 F (36.9 C) (Oral)   Ht 5\' 9"  (1.753 m)   Wt 212 lb 2 oz (96.2 kg)   BMI 31.33 kg/m   General: Well-nourished, well-developed in no acute distress.  Eyes: No icterus. Conjunctivae pink. Neuro: Alert and oriented x 3.  Grossly intact. Psych: Alert and cooperative, normal mood and  affect.  Labs:    Imaging Studies: No results found.  Assessment and Plan:   [name] is a 47 y.o. y/o male who comes in today with a report of diarrhea that may be related to the magnesium he takes for his heart.  The patient also has been told that he has osteoporosis and the cause of which has not been ascertained and postulated that he may not be absorbing iron.  The patient's iron levels and his vitamin D levels have been followed and have been normal in the past.  The patient will have his blood work sent off for possible celiac sprue and the patient will be set up for colonoscopy due to his age of being 85 and his diarrhea to rule out any microscopic colitis or cause for diarrhea.  The patient has been explained the plan and agrees with it.     Midge Minium, MD. Clementeen Graham    Note: This dictation was prepared with Dragon dictation along with smaller phrase technology. Any transcriptional errors that result from this process are unintentional.

## 2023-05-08 ENCOUNTER — Encounter: Payer: Self-pay | Admitting: Gastroenterology

## 2023-05-19 ENCOUNTER — Other Ambulatory Visit: Payer: Self-pay

## 2023-05-19 ENCOUNTER — Inpatient Hospital Stay (HOSPITAL_COMMUNITY)
Admission: EM | Admit: 2023-05-19 | Discharge: 2023-05-21 | DRG: 309 | Disposition: A | Discharge status: Other Acute Inpatient Hospital | Payer: BC Managed Care – PPO | Attending: Cardiovascular Disease | Admitting: Cardiovascular Disease

## 2023-05-19 ENCOUNTER — Encounter (HOSPITAL_COMMUNITY): Payer: Self-pay | Admitting: Physician Assistant

## 2023-05-19 DIAGNOSIS — K760 Fatty (change of) liver, not elsewhere classified: Secondary | ICD-10-CM | POA: Diagnosis present

## 2023-05-19 DIAGNOSIS — I472 Ventricular tachycardia, unspecified: Principal | ICD-10-CM | POA: Diagnosis present

## 2023-05-19 DIAGNOSIS — Z87891 Personal history of nicotine dependence: Secondary | ICD-10-CM

## 2023-05-19 DIAGNOSIS — Z87442 Personal history of urinary calculi: Secondary | ICD-10-CM

## 2023-05-19 DIAGNOSIS — Z8616 Personal history of COVID-19: Secondary | ICD-10-CM

## 2023-05-19 DIAGNOSIS — Z23 Encounter for immunization: Secondary | ICD-10-CM | POA: Diagnosis not present

## 2023-05-19 DIAGNOSIS — I4891 Unspecified atrial fibrillation: Secondary | ICD-10-CM | POA: Diagnosis present

## 2023-05-19 DIAGNOSIS — Z7983 Long term (current) use of bisphosphonates: Secondary | ICD-10-CM | POA: Diagnosis not present

## 2023-05-19 DIAGNOSIS — Z833 Family history of diabetes mellitus: Secondary | ICD-10-CM

## 2023-05-19 DIAGNOSIS — G4733 Obstructive sleep apnea (adult) (pediatric): Secondary | ICD-10-CM | POA: Diagnosis present

## 2023-05-19 DIAGNOSIS — Z8042 Family history of malignant neoplasm of prostate: Secondary | ICD-10-CM

## 2023-05-19 DIAGNOSIS — Z8249 Family history of ischemic heart disease and other diseases of the circulatory system: Secondary | ICD-10-CM

## 2023-05-19 DIAGNOSIS — Z884 Allergy status to anesthetic agent status: Secondary | ICD-10-CM | POA: Diagnosis not present

## 2023-05-19 DIAGNOSIS — Z801 Family history of malignant neoplasm of trachea, bronchus and lung: Secondary | ICD-10-CM | POA: Diagnosis not present

## 2023-05-19 DIAGNOSIS — Z9049 Acquired absence of other specified parts of digestive tract: Secondary | ICD-10-CM

## 2023-05-19 DIAGNOSIS — J45909 Unspecified asthma, uncomplicated: Secondary | ICD-10-CM | POA: Diagnosis present

## 2023-05-19 DIAGNOSIS — Z818 Family history of other mental and behavioral disorders: Secondary | ICD-10-CM | POA: Diagnosis not present

## 2023-05-19 DIAGNOSIS — Z95810 Presence of automatic (implantable) cardiac defibrillator: Secondary | ICD-10-CM | POA: Diagnosis not present

## 2023-05-19 DIAGNOSIS — Z825 Family history of asthma and other chronic lower respiratory diseases: Secondary | ICD-10-CM

## 2023-05-19 DIAGNOSIS — Z823 Family history of stroke: Secondary | ICD-10-CM

## 2023-05-19 DIAGNOSIS — Z79899 Other long term (current) drug therapy: Secondary | ICD-10-CM

## 2023-05-19 DIAGNOSIS — M810 Age-related osteoporosis without current pathological fracture: Secondary | ICD-10-CM | POA: Diagnosis present

## 2023-05-19 DIAGNOSIS — Z803 Family history of malignant neoplasm of breast: Secondary | ICD-10-CM

## 2023-05-19 DIAGNOSIS — Z91030 Bee allergy status: Secondary | ICD-10-CM | POA: Diagnosis not present

## 2023-05-19 DIAGNOSIS — I428 Other cardiomyopathies: Secondary | ICD-10-CM | POA: Diagnosis present

## 2023-05-19 DIAGNOSIS — Z800 Family history of malignant neoplasm of digestive organs: Secondary | ICD-10-CM | POA: Diagnosis not present

## 2023-05-19 LAB — COMPREHENSIVE METABOLIC PANEL
ALT: 118 U/L — ABNORMAL HIGH (ref 0–44)
AST: 121 U/L — ABNORMAL HIGH (ref 15–41)
Albumin: 4.1 g/dL (ref 3.5–5.0)
Alkaline Phosphatase: 36 U/L — ABNORMAL LOW (ref 38–126)
Anion gap: 12 (ref 5–15)
BUN: 6 mg/dL (ref 6–20)
CO2: 26 mmol/L (ref 22–32)
Calcium: 9.4 mg/dL (ref 8.9–10.3)
Chloride: 102 mmol/L (ref 98–111)
Creatinine, Ser: 0.97 mg/dL (ref 0.61–1.24)
GFR, Estimated: >60 mL/min (ref >60)
Glucose, Bld: 127 mg/dL — ABNORMAL HIGH (ref 70–99)
Potassium: 3.9 mmol/L (ref 3.5–5.1)
Sodium: 140 mmol/L (ref 135–145)
Total Bilirubin: 0.6 mg/dL (ref 0.3–1.2)
Total Protein: 6.8 g/dL (ref 6.5–8.1)

## 2023-05-19 LAB — CBC WITH DIFFERENTIAL/PLATELET
Abs Immature Granulocytes: 0.04 10*3/uL (ref 0.00–0.07)
Basophils Absolute: 0.1 10*3/uL (ref 0.0–0.1)
Basophils Relative: 1 %
Eosinophils Absolute: 0.2 10*3/uL (ref 0.0–0.5)
Eosinophils Relative: 3 %
HCT: 42.5 % (ref 39.0–52.0)
Hemoglobin: 14.3 g/dL (ref 13.0–17.0)
Immature Granulocytes: 1 %
Lymphocytes Relative: 42 %
Lymphs Abs: 3.3 10*3/uL (ref 0.7–4.0)
MCH: 30.9 pg (ref 26.0–34.0)
MCHC: 33.6 g/dL (ref 30.0–36.0)
MCV: 91.8 fL (ref 80.0–100.0)
Monocytes Absolute: 0.8 10*3/uL (ref 0.1–1.0)
Monocytes Relative: 11 %
Neutro Abs: 3.4 10*3/uL (ref 1.7–7.7)
Neutrophils Relative %: 42 %
Platelets: 224 10*3/uL (ref 150–400)
RBC: 4.63 MIL/uL (ref 4.22–5.81)
RDW: 12.3 % (ref 11.5–15.5)
WBC: 7.8 10*3/uL (ref 4.0–10.5)
nRBC: 0 % (ref 0.0–0.2)

## 2023-05-19 LAB — HIV ANTIBODY (ROUTINE TESTING W REFLEX): HIV Screen 4th Generation wRfx: NONREACTIVE

## 2023-05-19 LAB — TROPONIN I (HIGH SENSITIVITY)
Troponin I (High Sensitivity): 17 ng/L (ref <18)
Troponin I (High Sensitivity): 25 ng/L — ABNORMAL HIGH (ref <18)

## 2023-05-19 LAB — MAGNESIUM: Magnesium: 1.8 mg/dL (ref 1.7–2.4)

## 2023-05-19 MED ORDER — POTASSIUM CHLORIDE CRYS ER 20 MEQ PO TBCR: 40.0000 meq | EXTENDED_RELEASE_TABLET | Freq: Two times a day (BID) | ORAL | Status: DC

## 2023-05-19 MED ORDER — ENOXAPARIN SODIUM 40 MG/0.4ML IJ SOSY
40.0000 mg | PREFILLED_SYRINGE | INTRAMUSCULAR | Status: DC
  Administered 2023-05-19 – 2023-05-21 (×3): 40 mg via SUBCUTANEOUS
  Filled 2023-05-19 (×3): qty 0.4

## 2023-05-19 MED ORDER — INFLUENZA VIRUS VACC SPLIT PF (FLUZONE) 0.5 ML IM SUSY
0.5000 mL | PREFILLED_SYRINGE | INTRAMUSCULAR | Status: AC
  Administered 2023-05-20: 0.5 mL via INTRAMUSCULAR
  Filled 2023-05-19: qty 0.5

## 2023-05-19 MED ORDER — ACETAMINOPHEN 325 MG PO TABS: 650.0000 mg | ORAL_TABLET | ORAL | Status: DC | PRN

## 2023-05-19 MED ORDER — SODIUM CHLORIDE 0.9% FLUSH: 3.0000 mL | INTRAVENOUS | Status: DC | PRN

## 2023-05-19 MED ORDER — ALPRAZOLAM 0.25 MG PO TABS
0.2500 mg | ORAL_TABLET | Freq: Two times a day (BID) | ORAL | Status: DC | PRN
  Administered 2023-05-19 – 2023-05-20 (×3): 0.25 mg via ORAL
  Filled 2023-05-19 (×3): qty 1

## 2023-05-19 MED ORDER — SOTALOL HCL (AF) 120 MG PO TABS: 180.0000 mg | ORAL_TABLET | Freq: Two times a day (BID) | ORAL | Status: DC

## 2023-05-19 MED ORDER — ONDANSETRON HCL 4 MG/2ML IJ SOLN: 4.0000 mg | Freq: Four times a day (QID) | INTRAMUSCULAR | Status: DC | PRN

## 2023-05-19 MED ORDER — MONTELUKAST SODIUM 10 MG PO TABS
10.0000 mg | ORAL_TABLET | Freq: Every day | ORAL | Status: DC
  Administered 2023-05-19 – 2023-05-21 (×3): 10 mg via ORAL
  Filled 2023-05-19 (×3): qty 1

## 2023-05-19 MED ORDER — ZOLPIDEM TARTRATE 5 MG PO TABS: 5.0000 mg | ORAL_TABLET | Freq: Every evening | ORAL | Status: DC | PRN

## 2023-05-19 MED ORDER — MAGNESIUM OXIDE -MG SUPPLEMENT 400 (240 MG) MG PO TABS
400.0000 mg | ORAL_TABLET | Freq: Two times a day (BID) | ORAL | Status: DC
  Administered 2023-05-19 – 2023-05-21 (×5): 400 mg via ORAL
  Filled 2023-05-19 (×5): qty 1

## 2023-05-19 MED ORDER — NEBIVOLOL HCL 5 MG PO TABS
5.0000 mg | ORAL_TABLET | Freq: Every day | ORAL | Status: DC
  Administered 2023-05-19 – 2023-05-21 (×3): 5 mg via ORAL
  Filled 2023-05-19 (×3): qty 1

## 2023-05-19 MED ORDER — MAGNESIUM SULFATE 2 GM/50ML IV SOLN
2.0000 g | Freq: Once | INTRAVENOUS | Status: AC
  Administered 2023-05-19: 2 g via INTRAVENOUS
  Filled 2023-05-19: qty 50

## 2023-05-19 MED ORDER — FLECAINIDE ACETATE 50 MG PO TABS
50.0000 mg | ORAL_TABLET | Freq: Two times a day (BID) | ORAL | Status: DC
  Administered 2023-05-19 – 2023-05-21 (×5): 50 mg via ORAL
  Filled 2023-05-19 (×5): qty 1

## 2023-05-19 MED ORDER — NITROGLYCERIN 0.4 MG SL SUBL: 0.4000 mg | SUBLINGUAL_TABLET | SUBLINGUAL | Status: DC | PRN

## 2023-05-19 MED ORDER — SOTALOL HCL 120 MG PO TABS
180.0000 mg | ORAL_TABLET | Freq: Two times a day (BID) | ORAL | Status: DC
  Administered 2023-05-19 – 2023-05-21 (×5): 180 mg via ORAL
  Filled 2023-05-19 (×6): qty 1.5

## 2023-05-19 MED ORDER — POTASSIUM CHLORIDE CRYS ER 20 MEQ PO TBCR
40.0000 meq | EXTENDED_RELEASE_TABLET | Freq: Two times a day (BID) | ORAL | Status: DC
  Administered 2023-05-19: 40 meq via ORAL
  Filled 2023-05-19: qty 2

## 2023-05-19 MED ORDER — SODIUM CHLORIDE 0.9% FLUSH
3.0000 mL | Freq: Two times a day (BID) | INTRAVENOUS | Status: DC
  Administered 2023-05-19 – 2023-05-21 (×4): 3 mL via INTRAVENOUS

## 2023-05-19 MED ORDER — SODIUM CHLORIDE 0.9 % IV SOLN: 250.0000 mL | INTRAVENOUS | Status: DC | PRN

## 2023-05-19 NOTE — H&P (Addendum)
Cardiology Admission History and Physical   Patient ID: [name] MRN: [id number]; DOB: 03-May-1976   Admission date: 05/19/2023  PCP:  Judy Pimple, MD   [facility] Providers Cardiologist:  Sherryl Manges, MD  Electrophysiologist:  Sherryl Manges, MD       Chief Complaint:  Defib fired  Patient Profile:   [name] is a 47 y.o. 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 On chronic sotalol and magnesium.  Also w/ hx  OSA on CPAP, asthma, fatty liver, osteoporosis.   He is being seen 05/19/2023 for the evaluation of ICD d/c.  History of Present Illness:   Mr. [name] was in his usual state of health today.  He has not missed any doses of his medications, and has not had any med changes.  He is compliant with Mag-Ox 400 mg twice daily, Klor-Con 40 mEq twice daily, nebivolol 5 mg daily and sotalol 180 mg twice daily.  He has not been eating or drinking anything unusual and has not been sick recently.  No injuries or falls.  Today, he got up off the couch and was walking to the front door when he got shocked.  He had no chest pain or shortness of breath.  He had no palpitations immediately prior to the shock.  He does however, have palpitations all the time so could not tell any difference.  In the emergency room, he is anxious about the situation, but not in any acute distress.   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   Asthma when younger   Fatty liver    History of chicken pox     History of kidney stones    MDT DVFB1D1 Visia AF MRI VR  ICD (implantable cardiac defibrillator) in place    Osteopenia    Pancreatitis    Sleep apnea    uses cpap   Ventricular tachycardia (HCC)    sotalol therapy;  catheter ablation at [facility] 10/10 and [facility] 2013    Past Surgical History:  Procedure Laterality Date   CARDIAC CATHETERIZATION  06/01/2006   CARDIAC DEFIBRILLATOR PLACEMENT  06/03/2006   [employer]tr[employer]   CHOLECYSTECTOMY N/A 08/01/2021   Procedure: LAPAROSCOPIC CHOLECYSTECTOMY WITH INTRAOPERATIVE CHOLANGIOGRAM;  Surgeon: Harriette Bouillon, MD;  Location: [facility] ORS;  Service: General;  Laterality: N/A;   HAND SURGERY Left    ICD GENERATOR CHANGEOUT N/A 11/04/2019   Procedure: ICD GENERATOR CHANGEOUT;  Surgeon: [name], MD;  Location: [facility] INVASIVE CV LAB;  Service: Cardiovascular;  Laterality: N/A;   IMPLANTABLE CARDIOVERTER DEFIBRILLATOR GENERATOR CHANGE N/A 12/11/2011   Procedure: IMPLANTABLE CARDIOVERTER DEFIBRILLATOR GENERATOR CHANGE;  Surgeon: [name], MD;  Location: [facility] CATH LAB;  Service: Cardiovascular;  Laterality: N/A;   KYPHOPLASTY N/A 05/14/2021   Procedure: Lumbar Five Vertebroplasty;  Surgeon: Jadene Pierini, MD;  Location: [facility] OR;  Service: Neurosurgery;  Laterality: N/A;   LUMBAR LAMINECTOMY/ DECOMPRESSION WITH MET-RX N/A 05/14/2021   Procedure: Lumbar Four-Five Minimally Invasive Laminectomy;  Surgeon: Jadene Pierini,  MD;  Location: [facility] OR;  Service: Neurosurgery;  Laterality: N/A;  3C/RM 19   TONSILLECTOMY     vt ablation  10/10, 10/13   x 2   WISDOM TOOTH EXTRACTION       Medications Prior to Admission: Prior to Admission medications   Medication Sig Start Date End Date Taking? Authorizing Provider  alendronate (FOSAMAX) 70 MG tablet Take 70 mg by mouth once a week. 02/13/23   [provider]  ALPRAZolam Prudy Feeler) 0.5 MG tablet Take 1 tablet (0.5 mg total) by mouth at bedtime as needed for anxiety. 03/12/23   [name], [name], MD   benzonatate (TESSALON) 200 MG capsule Take 1 capsule (200 mg total) by mouth 3 (three) times daily as needed for cough. Swallow whole 04/15/23   [name], [name], MD  Calcium-Phosphorus-Vitamin D (CITRACAL +D3 PO) Take 1 tablet by mouth daily.    [provider]  Cholecalciferol (VITAMIN D3) 50 MCG (2000 UT) TABS Take 2,000 Units by mouth daily.    [provider]  fexofenadine (ALLEGRA) 180 MG tablet Take 180 mg by mouth daily.    [provider]  KLOR-CON M10 10 MEQ tablet TAKE 4 TABLETS BY MOUTH TWICE A DAY Patient taking differently: Take 40 mEq by mouth 2 (two) times daily. 06/10/22   [name], MD  magnesium oxide (MAG-OX) 400 (240 Mg) MG tablet TAKE 1 TABLET BY MOUTH TWICE A DAY 07/22/22   [name], MD  montelukast (SINGULAIR) 10 MG tablet TAKE 1 TABLET BY MOUTH EVERY DAY 03/05/23   [name], [name], MD  Multiple Vitamin (MULITIVITAMIN WITH MINERALS) TABS Take 1 tablet by mouth daily.    [provider]  nebivolol (BYSTOLIC) 5 MG tablet Take 1 tablet (5 mg total) by mouth daily. 11/05/22   [name], MD  predniSONE (DELTASONE) 10 MG tablet Take 3 pills once daily by mouth for 3 days, then 2 pills once daily for 3 days, then 1 pill once daily for 3 days and then stop 04/15/23   [name], [name], MD  SOTALOL AF 120 MG TABS TAKE 1 & 1/2 TABLETS BY MOUTH TWICE DAILY 01/05/23   [name], MD  tadalafil (CIALIS) 5 MG tablet Take 5 mg by mouth daily as needed for erectile dysfunction.    [provider]  Zinc 30 MG CAPS Take 30 mg by mouth at bedtime.    [provider]     Allergies:    Allergies  Allergen Reactions   Bee Venom Anaphylaxis   Lidocaine     [name]    Social History:   Social History   Socioeconomic History   Marital status: Married    Spouse name: Not on file   Number of children: 0   Years of education: Not on file   Highest education level: Not on file  Occupational History   Occupation:  EMT/PARAMEDIC  Tobacco Use   Smoking status: Former    Current packs/day: 0.00    Average packs/day: 1 pack/day for 18.0 years (18.0 ttl pk-yrs)    Types: Cigarettes    Start date: 01/17/1990    Quit date: 08/19/2007    Years since quitting: 15.7   Smokeless tobacco: Never  Vaping Use   Vaping status: Never Used  Substance and Sexual Activity   Alcohol use: No    Alcohol/week: 0.0 standard drinks of alcohol   Drug use: No   Sexual activity: Yes  Other Topics Concern  Not on file  Social History Narrative   Works as a Passenger transport manager Strain: Low Risk  (02/09/2021)   [facility]   Overall Financial Resource Strain (CARDIA)    Difficulty of Paying Living Expenses: Not hard at all  Food Insecurity: Low Risk  (02/16/2023)   [facility]   Hunger Vital Sign    Worried About Running Out of Food in the Last Year: Never true    Ran Out of Food in the Last Year: Never true  Transportation Needs: No Transportation Needs (02/16/2023)   [facility]   Transportation    In the past 12 months, has lack of reliable transportation kept you from medical appointments, meetings, work or from getting things needed for daily living? : No  Physical Activity: Not on file  Stress: Not on file  Social Connections: Not on file  Intimate Partner Violence: Not on file    Family History:  The patient's family history includes Asthma in his mother; Breast cancer in an other family member; Colon cancer in an other family member; Depression in an other family member; Diabetes in an other family member; Drug abuse in an other family member; Heart attack in an other family member; Heart disease in his maternal grandfather and paternal grandfather; Hypertension in his paternal grandmother; Lung cancer in his maternal grandfather; Ovarian cancer in an other family member;  Prostate cancer in an other family member; Stroke in an other family member; Sudden death in his father; Uterine cancer in an other family member.    ROS:  Please see the history of present illness. All other ROS reviewed and negative.     Physical Exam/Data:   Vitals:   05/19/23 1615 05/19/23 1645 05/19/23 1715 05/19/23 1730  BP: 113/70 117/77 112/63 114/61  Pulse: 71 76 70 75  Resp: 17 11 18 18   SpO2: 100% 97% 98% 96%   No intake or output data in the 24 hours ending 05/19/23 1748    05/05/2023    3:17 PM 04/15/2023   12:25 PM 03/04/2023   12:28 PM  Last 3 Weights  Weight (lbs) 212 lb 2 oz 213 lb 8 oz 211 lb  Weight (kg) 96.219 kg 96.843 kg 95.709 kg     There is no height or weight on file to calculate BMI.  General:  Well nourished, well developed, in no acute distress HEENT: normal Neck: no JVD Vascular: No carotid bruits; Distal pulses 2+ bilaterally   Cardiac:  normal S1, S2; RRR; no murmur  Lungs:  clear to auscultation bilaterally, no wheezing, rhonchi or rales  Abd: soft, nontender, no hepatomegaly  Ext: no edema Musculoskeletal:  No deformities, BUE and BLE strength normal and equal Skin: warm and dry  Neuro:  CNs 2-12 intact, no focal abnormalities noted Psych:  Normal affect    EKG:  The ECG shows sinus rhythm, heart rate 71, multifocal PVCs   Relevant CV Studies:  ECHO: 03/01/2023  1. Left ventricular ejection fraction, by estimation, is 60 to 65%. The  left ventricle has normal function. The left ventricle has no regional  wall motion abnormalities. Left ventricular diastolic parameters were  normal.   2. The RV apex appears hypertrabeculated.. Right ventricular systolic  function is normal. The right ventricular size is mildly enlarged. There  is normal pulmonary artery systolic pressure.   3. The mitral valve  is normal in structure. Trivial mitral valve  regurgitation. No evidence of mitral stenosis.   4. The aortic valve is normal in structure.  Aortic valve regurgitation is  not visualized. No aortic stenosis is present.   5. The inferior vena cava is normal in size with greater than 50%  respiratory variability, suggesting right atrial pressure of 3 mmHg.   Laboratory Data:  High Sensitivity Troponin:   Recent Labs  Lab 05/19/23 1641  TROPONINIHS 17      Chemistry Recent Labs  Lab 05/19/23 1641  NA 140  K 3.9  CL 102  CO2 26  GLUCOSE 127*  BUN 6  CREATININE 0.97  CALCIUM 9.4  MG 1.8  GFRNONAA >60  ANIONGAP 12    Recent Labs  Lab 05/19/23 1641  PROT 6.8  ALBUMIN 4.1  AST 121*  ALT 118*  ALKPHOS 36*  BILITOT 0.6   Lipids No results for input(s): "CHOL", "TRIG", "HDL", "LABVLDL", "LDLCALC", "CHOLHDL" in the last 168 hours. Hematology Recent Labs  Lab 05/19/23 1641  WBC 7.8  RBC 4.63  HGB 14.3  HCT 42.5  MCV 91.8  MCH 30.9  MCHC 33.6  RDW 12.3  PLT 224   Thyroid No results for input(s): "TSH", "FREET4" in the last 168 hours. BNPNo results for input(s): "BNP", "PROBNP" in the last 168 hours.  DDimer No results for input(s): "DDIMER" in the last 168 hours.   Radiology/Studies:  No results found.   Assessment and Plan:   Appropriate defibrillator firing -Device interrogation reviewed.  He has had 1 episode of VT since last interrogation.  He has had 7 episodes of nonsustained VT greater than 4 beats that did not require intervention.  It also describes 1 episode of atrial fibrillation.  However, it mentions that ventricular under sensing is possible, RV sensing greater than 0.6 mV -Admit, supplement magnesium which is 1.8 and potassium which is 3.9. - further med mgt per MD - continue home meds.     Risk Assessment/Risk Scores:     Code Status: Full Code   For questions or updates, please contact [facility] Please consult [email or web address] for contact info under     Signed, Theodore Demark, PA-C  05/19/2023 5:48 PM   I have seen and examined the patient along with  Theodore Demark, PA-C .  I have reviewed the chart, notes and new data.  I agree with PA/NP's note.  Key new complaints: Never lost consciousness.  He was awake for the defibrillator discharge. Key examination changes: Normal cardiovascular exam.  Frequent polymorphic PVCs seen on monitor there does appear to be a dominant morphology. Key new findings / data: K 3.9, Mg 1.8. ECG w QTc 444 ms.  Intracardiac electrogram shows no evidence of R-on-T phenomenon or torsades.  There is abrupt onset of monomorphic VT w CL 300s, unsuccessful ATP during charge, followed by acceleration of the VT to CL 210 ms, successfully terminated by a single high-voltage shock.  Total episode duration was 27 seconds.    PLAN:  Very challenging situation with repeated episodes of symptomatic ventricular tachycardia despite 2 previous attempts at ablation (including convergent epi/Endo cardial ablation), on relatively high-dose sotalol therapy plus beta-blocker.  Electrolytes are normal range, albeit just short of desirable mid range level.  No evidence of sotalol proarrhythmic effect.  Supplementing potassium and magnesium.  Discussed further management with Dr. Graciela Husbands.  He recommended addition of flecainide 50 mg twice daily while continuing sotalol and nebivolol.  Hopefully suppression of PVCs will  reduce the likelihood of recurrent ventricular tachycardia.  He understands that he will need close inpatient monitoring during transition to this new antiarrhythmic regimen.  Thurmon Fair, MD, [facility] [phone] 05/19/2023, 7:28 PM

## 2023-05-19 NOTE — ED Notes (Addendum)
Pts BP 96/61 & HR 63. Spoke with ED pharmacist regarding pts beta blockers and BP, recommended to confirm parameters with cardiology. Spoke with Cardiologist Agha who recommended give beta blockers if BP is greater than 90/60 and HR is greater than 60

## 2023-05-19 NOTE — ED Provider Notes (Signed)
[place] EMERGENCY DEPARTMENT AT [facility] Provider Note   CSN: [id number] Arrival date & time: 05/19/23  1607     History Chief Complaint  Patient presents with   AICD Problem    HPI NAHEIM BURGEN is a 47 y.o. male presenting for chief complaint of defib.  History of V. tach, fatty liver, ICD placement, arrhythmia genic right ventricular cardiomyopathy Patient's recorded medical, surgical, social, medication list and allergies were reviewed in the Snapshot window as part of the initial history.   Review of Systems   Review of Systems  Constitutional:  Negative for chills and fever.  HENT:  Negative for ear pain and sore throat.   Eyes:  Negative for pain and visual disturbance.  Respiratory:  Negative for cough and shortness of breath.   Cardiovascular:  Negative for chest pain and palpitations.  Gastrointestinal:  Negative for abdominal pain and vomiting.  Genitourinary:  Negative for dysuria and hematuria.  Musculoskeletal:  Negative for arthralgias and back pain.  Skin:  Negative for color change and rash.  Neurological:  Negative for seizures and syncope.  All other systems reviewed and are negative.   Physical Exam Updated Vital Signs BP 96/61   Pulse 63   Temp 98.6 F (37 C) (Oral)   Resp 12   SpO2 97%  Physical Exam Vitals and nursing note reviewed.  Constitutional:      General: He is not in acute distress.    Appearance: He is well-developed.  HENT:     Head: Normocephalic and atraumatic.  Eyes:     Conjunctiva/sclera: Conjunctivae normal.  Cardiovascular:     Rate and Rhythm: Normal rate and regular rhythm.     Heart sounds: No murmur heard. Pulmonary:     Effort: Pulmonary effort is normal. No respiratory distress.     Breath sounds: Normal breath sounds.  Abdominal:     Palpations: Abdomen is soft.     Tenderness: There is no abdominal tenderness.  Musculoskeletal:        General: No swelling.     Cervical back: Neck supple.   Skin:    General: Skin is warm and dry.     Capillary Refill: Capillary refill takes less than 2 seconds.  Neurological:     Mental Status: He is alert.  Psychiatric:        Mood and Affect: Mood normal.      ED Course/ Medical Decision Making/ A&P Clinical Course as of 05/19/23 2256  Tue May 19, 2023  1634 [name], [CC]  1636 DVFB1D1 Visia AF MRI VR  [CC]  1702 Informed cardsmaster [CC]    Clinical Course User Index [CC] Glyn Ade, MD    Procedures .Critical Care  Performed by: Glyn Ade, MD Authorized by: Glyn Ade, MD   Critical care provider statement:    Critical care time (minutes):  30   Critical care was necessary to treat or prevent imminent or life-threatening deterioration of the following conditions:  Cardiac failure Kirby Funk.)   Critical care was time spent personally by me on the following activities:  Development of treatment plan with patient or surrogate, discussions with consultants, evaluation of patient's response to treatment, examination of patient, ordering and review of laboratory studies, ordering and review of radiographic studies, ordering and performing treatments and interventions, pulse oximetry, re-evaluation of patient's condition and review of old charts    Medications Ordered in ED Medications  nitroGLYCERIN (NITROSTAT) SL tablet 0.4 mg (has no administration in  time range)  acetaminophen (TYLENOL) tablet 650 mg (has no administration in time range)  ondansetron (ZOFRAN) injection 4 mg (has no administration in time range)  ALPRAZolam (XANAX) tablet 0.25 mg (0.25 mg Oral Given 05/19/23 1945)  enoxaparin (LOVENOX) injection 40 mg (40 mg Subcutaneous Given 05/19/23 1946)  sodium chloride flush (NS) 0.9 % injection 3 mL (3 mLs Intravenous Given 05/19/23 2214)  sodium chloride flush (NS) 0.9 % injection 3 mL (has no administration in time range)  0.9 %  sodium chloride infusion (has no administration in time range)   zolpidem (AMBIEN) tablet 5 mg (has no administration in time range)  magnesium oxide (MAG-OX) tablet 400 mg (400 mg Oral Given 05/19/23 2113)  montelukast (SINGULAIR) tablet 10 mg (10 mg Oral Given 05/19/23 2113)  nebivolol (BYSTOLIC) tablet 5 mg (5 mg Oral Given 05/19/23 2212)  potassium chloride SA (KLOR-CON M) CR tablet 40 mEq (40 mEq Oral Given 05/19/23 1945)  flecainide (TAMBOCOR) tablet 50 mg (50 mg Oral Given 05/19/23 1945)  sotalol (BETAPACE) tablet 180 mg (180 mg Oral Given 05/19/23 2213)  influenza vac split trivalent PF (FLULAVAL) injection 0.5 mL (has no administration in time range)  magnesium sulfate IVPB 2 g 50 mL (0 g Intravenous Stopped 05/19/23 2037)   Medical Decision Making: 47 year old male presenting with AICD shock. History of similar.  Evaluated emergently at bedside.  Watched on telemetry without evidence of acute pathology detected.  He states he was otherwise at his mental status baseline not under any particular stress and has been compliant with all of his medication. It was uncomfortable when it fired approximate hour prior to arrival.  Denies any other symptoms.  Otherwise ambulatory tolerating p.o. intake on arrival.  Initial assessment and plan: Patient's history present on his physical 7 exam is consistent with likely exacerbation of his underlying arrhythmogenic right ventricular dysplasia. Also considered emergent metabolic abnormality, hematologic abnormality, primary ischemic disease though this seems less likely based on his age and other risk factors. Consulted cardiology emergently after getting interrogation that showed VT episode now treated with defibrillation from his indwelling device.  Given finding of V. tach requiring shock, patient was arranged for admission to the cardiology service. Disposition:   Based on the above findings, I believe this patient is stable for admission.    Patient/family educated about specific findings on our evaluation and  explained exact reasons for admission.  Patient/family educated about clinical situation and time was allowed to answer questions.   Admission team communicated with and agreed with need for admission. Patient admitted. Patient ready to move at this time.     Emergency Department Medication Summary:   Medications  nitroGLYCERIN (NITROSTAT) SL tablet 0.4 mg (has no administration in time range)  acetaminophen (TYLENOL) tablet 650 mg (has no administration in time range)  ondansetron (ZOFRAN) injection 4 mg (has no administration in time range)  ALPRAZolam (XANAX) tablet 0.25 mg (0.25 mg Oral Given 05/19/23 1945)  enoxaparin (LOVENOX) injection 40 mg (40 mg Subcutaneous Given 05/19/23 1946)  sodium chloride flush (NS) 0.9 % injection 3 mL (3 mLs Intravenous Given 05/19/23 2214)  sodium chloride flush (NS) 0.9 % injection 3 mL (has no administration in time range)  0.9 %  sodium chloride infusion (has no administration in time range)  zolpidem (AMBIEN) tablet 5 mg (has no administration in time range)  magnesium oxide (MAG-OX) tablet 400 mg (400 mg Oral Given 05/19/23 2113)  montelukast (SINGULAIR) tablet 10 mg (10 mg Oral Given 05/19/23 2113)  nebivolol (  BYSTOLIC) tablet 5 mg (5 mg Oral Given 05/19/23 2212)  potassium chloride SA (KLOR-CON M) CR tablet 40 mEq (40 mEq Oral Given 05/19/23 1945)  flecainide (TAMBOCOR) tablet 50 mg (50 mg Oral Given 05/19/23 1945)  sotalol (BETAPACE) tablet 180 mg (180 mg Oral Given 05/19/23 2213)  influenza vac split trivalent PF (FLULAVAL) injection 0.5 mL (has no administration in time range)  magnesium sulfate IVPB 2 g 50 mL (0 g Intravenous Stopped 05/19/23 2037)        Clinical Impression:  1. V-tach (HCC)      Admit   Final Clinical Impression(s) / ED Diagnoses Final diagnoses:  V-tach [facility])    Rx / DC Orders ED Discharge Orders     None         Glyn Ade, MD 05/19/23 2256

## 2023-05-19 NOTE — ED Notes (Signed)
[employer] called, advised that single VT run today, treated with pacing then defib. No issues with device.

## 2023-05-19 NOTE — ED Triage Notes (Signed)
Pt BIBGEMS from home after having defib fire. Pt is alert and oriented felt dizzy before the firing. Has hx of it firing before for vtach.   70 hr 22 rr 97 oxygen 126/80

## 2023-05-20 ENCOUNTER — Other Ambulatory Visit: Payer: Self-pay

## 2023-05-20 DIAGNOSIS — I472 Ventricular tachycardia, unspecified: Secondary | ICD-10-CM | POA: Diagnosis not present

## 2023-05-20 LAB — MAGNESIUM: Magnesium: 2.2 mg/dL (ref 1.7–2.4)

## 2023-05-20 LAB — COMPREHENSIVE METABOLIC PANEL
ALT: 87 U/L — ABNORMAL HIGH (ref 0–44)
AST: 59 U/L — ABNORMAL HIGH (ref 15–41)
Albumin: 3.5 g/dL (ref 3.5–5.0)
Alkaline Phosphatase: 31 U/L — ABNORMAL LOW (ref 38–126)
Anion gap: 8 (ref 5–15)
BUN: <5 mg/dL — ABNORMAL LOW (ref 6–20)
CO2: 24 mmol/L (ref 22–32)
Calcium: 8.3 mg/dL — ABNORMAL LOW (ref 8.9–10.3)
Chloride: 107 mmol/L (ref 98–111)
Creatinine, Ser: 0.83 mg/dL (ref 0.61–1.24)
GFR, Estimated: >60 mL/min (ref >60)
Glucose, Bld: 97 mg/dL (ref 70–99)
Potassium: 3.5 mmol/L (ref 3.5–5.1)
Sodium: 139 mmol/L (ref 135–145)
Total Bilirubin: 0.5 mg/dL (ref 0.3–1.2)
Total Protein: 6 g/dL — ABNORMAL LOW (ref 6.5–8.1)

## 2023-05-20 MED ORDER — POTASSIUM CHLORIDE CRYS ER 20 MEQ PO TBCR
40.0000 meq | EXTENDED_RELEASE_TABLET | Freq: Three times a day (TID) | ORAL | Status: DC
  Administered 2023-05-20 – 2023-05-21 (×6): 40 meq via ORAL
  Filled 2023-05-20 (×6): qty 2

## 2023-05-20 NOTE — Progress Notes (Signed)
Remote ICD transmission.   

## 2023-05-20 NOTE — Consult Note (Addendum)
ELECTROPHYSIOLOGY CONSULT NOTE    Patient ID: [name] MRN: [id number], DOB/AGE: 10/23/1975 47 y.o.  Admit date: 05/19/2023 Date of Consult: 05/20/2023  Primary Physician: [name], [name], MD Primary Cardiologist: Sherryl Manges, MD  Electrophysiologist: Dr. Graciela Husbands   Referring Provider: Dr. Royann Shivers   Patient Profile: [name] is a 47 y.o. male with a history of arrhythmogenic RV cardiomyopathy, VT s/p MDT ICD (gen change 10/2019), VT catheter ablation at [facility], endo & epi-cardial ablation at [facility], on chronic sotalol, nebivolol + Mg, fatty liver & OSA on CPAP who is being seen today for the evaluation of VT at the request of Dr. Royann Shivers.  HPI:  [name] is a 47 y.o. male presented to [facility] ER on 10/1 with reports of defibrillation.   The pt reports he has been in his usual state of health. He was last shocked in July 2024 and was admitted at that time. He has not had shocks since then.  He notes his children have been sick & they recovered from COVID in August 2024.  He was getting his children ready when he began feeling dizzy.  The patient got up off the cough and was walking to the door when he was shocked. He was hemodynamically stable on presentation and alert. He denies swelling, chest pain, dyspnea, PND, nausea, vomiting, edema.  He has noted more fatigue over the past few months.  The patient's case was discussed with Dr. Graciela Husbands and flecainide was added on 10/1 > telemetry overnight showed less frequent PVC's and no further VT.   He denies chest pain, palpitations, dyspnea, PND, orthopnea, nausea, vomiting, dizziness, syncope, edema, weight gain, or early satiety.   Labs Potassium3.5 (10/02 0341) Magnesium  2.2 (10/02 0341) Creatinine, ser  0.83 (10/02 0341) PLT  224 (10/01 1641) HGB  14.3 (10/01 1641) WBC 7.8 (10/01 1641) Troponin I (High Sensitivity)25* (10/01 1935).    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   Asthma when younger   Fatty liver    History of chicken pox    History of kidney stones    MDT DVFB1D1 Visia AF MRI VR  ICD (implantable cardiac defibrillator) in place    Osteopenia    Pancreatitis    Sleep apnea    uses cpap   Ventricular tachycardia (HCC)    sotalol therapy;  catheter ablation at [facility] 10/10 and [facility] 2013     Surgical History:  Past Surgical History:  Procedure Laterality Date   CARDIAC CATHETERIZATION  06/01/2006   CARDIAC DEFIBRILLATOR PLACEMENT  06/03/2006   [employer]   CHOLECYSTECTOMY N/A 08/01/2021   Procedure: LAPAROSCOPIC CHOLECYSTECTOMY WITH INTRAOPERATIVE CHOLANGIOGRAM;  Surgeon: Harriette Bouillon, MD;  Location: [facility] ORS;  Service: General;  Laterality: N/A;   HAND SURGERY Left    ICD GENERATOR CHANGEOUT N/A 11/04/2019   Procedure: ICD GENERATOR CHANGEOUT;  Surgeon: [name], MD;  Location: [facility] INVASIVE CV LAB;  Service: Cardiovascular;  Laterality: N/A;   IMPLANTABLE CARDIOVERTER DEFIBRILLATOR GENERATOR CHANGE N/A 12/11/2011   Procedure: IMPLANTABLE CARDIOVERTER DEFIBRILLATOR GENERATOR CHANGE;  Surgeon: [name], MD;  Location: [facility] CATH LAB;  Service: Cardiovascular;  Laterality: N/A;   KYPHOPLASTY N/A 05/14/2021   Procedure: Lumbar Five Vertebroplasty;  Surgeon: Jadene Pierini, MD;  Location: [facility] OR;  Service: Neurosurgery;  Laterality: N/A;   LUMBAR LAMINECTOMY/ DECOMPRESSION WITH MET-RX N/A 05/14/2021   Procedure: Lumbar Four-Five Minimally Invasive Laminectomy;  Surgeon: Jadene Pierini, MD;  Location: [facility] OR;  Service: Neurosurgery;  Laterality: N/A;  3C/RM 19   TONSILLECTOMY     vt ablation  10/10, 10/13   x 2   WISDOM TOOTH EXTRACTION       (Not in a hospital admission)   Inpatient Medications:   enoxaparin (LOVENOX) injection  40 mg Subcutaneous Q24H   flecainide  50 mg Oral Q12H   magnesium  oxide  400 mg Oral BID   montelukast  10 mg Oral QHS   nebivolol  5 mg Oral QHS   potassium chloride  40 mEq Oral TID   sodium chloride flush  3 mL Intravenous Q12H   sotalol  180 mg Oral Q12H    Allergies:  Allergies  Allergen Reactions   Bee Venom Anaphylaxis   Lidocaine Other (See Comments)    IV formulation [name]    Family History  Problem Relation Age of Onset   Sudden death Father    Asthma Mother    Diabetes Other    Stroke Other    Heart attack Other    Prostate cancer Other    Breast cancer Other    Ovarian cancer Other    Uterine cancer Other    Colon cancer Other    Drug abuse Other    Depression Other    Lung cancer Maternal Grandfather    Heart disease Maternal Grandfather    Heart disease Paternal Grandfather    Hypertension Paternal Grandmother      Physical Exam: Vitals:   05/20/23 1200 05/20/23 1230 05/20/23 1237 05/20/23 1300  BP: 119/65 99/68    Pulse:    63  Resp: 14 (!) 8  14  Temp:   98.6 F (37 C)   TempSrc:   Oral   SpO2:        GEN- NAD, A&O x 3, normal affect HEENT: Normocephalic, atraumatic Lungs- CTAB, Normal effort.  Heart- Regular rate and rhythm, No M/G/R.  GI- Soft, NT, ND.  Extremities- No clubbing, cyanosis, or edema   Radiology/Studies: CUP PACEART REMOTE DEVICE CHECK  Result Date: 05/05/2023 Scheduled remote reviewed. Normal device function.  1 AF episode, 6 min, EGM c/w PAC and PVC ectopy. 5 VT-NS classified events, 6 sec,168-203 bpm, no therapies delivered.  Next remote 91 days. [facility], CVRS   EKG: 10/1 SR 71, PVC's with 2 morphologies, QTc acceptable on sotalol  (personally reviewed) 10/2 SR 60, stable intervals compared to 10/1 EKG, flecainide added   TELEMETRY: SR 60's with PVC's (personally reviewed)  DEVICE HISTORY:  MDT single chamber ICD > generator change 11/04/2019  STUDIES: ECHO 02/2023 > LVEF 60-65%  Assessment/Plan:  Monomorphic VT  -device interrogation 05/19/23 showed normal lead  measurements and battery status -VT episode on 10/1 treated in FVT Zone.  CL VT 290-237ms > ATP broke for one beat, then immediately returned to VT and accelerates to CL of 200-225ms, then HV therapy at 36.2j which converted him to ST.  Total episode lasted 9 seconds.  -continue sotalol, nebivolol -flecainide 50 mg BID initiated 10/1 with communication with Dr. Graciela Husbands. Intervals after first dose appear stable. Telemetry stabilized with no VT, improved PVC burden -no known coronary calcium score or LHC -will need EKG in 2 wks and ETT -follow electrolytes closely, goal K+>4, Mg+ >2    For questions or updates, please contact [facility]  Please consult [email or web address] for contact info under Cardiology/STEMI.  Signed, Canary Brim, MSN, APRN, NP-C, AGACNP-BC [facility] - Electrophysiology  05/20/2023, 1:55 PM

## 2023-05-20 NOTE — ED Notes (Signed)
ED TO INPATIENT HANDOFF REPORT  ED Nurse Name and Phone #:  Morrie Sheldon [phone]   S Name/Age/Gender [name] 47 y.o. male Room/Bed: 041C/041C  Code Status   Code Status: Full Code  Home/SNF/Other Home Patient oriented to: self, place, time, and situation Is this baseline? Yes   Triage Complete: Triage complete  Chief Complaint VT (ventricular tachycardia) (HCC) [I47.20]  Triage Note Pt BIBGEMS from home after having defib fire. Pt is alert and oriented felt dizzy before the firing. Has hx of it firing before for vtach.   70 hr 22 rr 97 oxygen 126/80   Allergies Allergies  Allergen Reactions   Bee Venom Anaphylaxis   Lidocaine Other (See Comments)    IV formulation [name]    Level of Care/Admitting Diagnosis ED Disposition     ED Disposition  Admit   Condition  --   Comment  Hospital Area: [name] [[id number]]  Level of Care: Telemetry Cardiac [[id number]]  May admit patient to [facility] or [facility] if equivalent level of care is available:: No  Covid Evaluation: Asymptomatic - no recent exposure (last 10 days) testing not required  Diagnosis: VT (ventricular tachycardia) [facility]) [[id number]]  Admitting Physician: Thurmon Fair [[id number]]  Attending Physician: Thurmon Fair [[id number]]  Certification:: I certify there are rare and unusual circumstances requiring inpatient admission  Expected Medical Readiness: 05/22/2023          B Medical/Surgery History 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   Asthma when younger   Fatty liver    History of chicken pox    History of kidney stones    MDT DVFB1D1 Visia AF MRI VR  ICD (implantable cardiac defibrillator) in place    Osteopenia    Pancreatitis    Sleep apnea    uses cpap   Ventricular tachycardia (HCC)    sotalol therapy;   catheter ablation at [facility] 10/10 and [facility] 2013   Past Surgical History:  Procedure Laterality Date   CARDIAC CATHETERIZATION  06/01/2006   CARDIAC DEFIBRILLATOR PLACEMENT  06/03/2006   Medtronic   CHOLECYSTECTOMY N/A 08/01/2021   Procedure: LAPAROSCOPIC CHOLECYSTECTOMY WITH INTRAOPERATIVE CHOLANGIOGRAM;  Surgeon: Harriette Bouillon, MD;  Location: [facility] ORS;  Service: General;  Laterality: N/A;   HAND SURGERY Left    ICD GENERATOR CHANGEOUT N/A 11/04/2019   Procedure: ICD GENERATOR CHANGEOUT;  Surgeon: [name], MD;  Location: [facility] INVASIVE CV LAB;  Service: Cardiovascular;  Laterality: N/A;   IMPLANTABLE CARDIOVERTER DEFIBRILLATOR GENERATOR CHANGE N/A 12/11/2011   Procedure: IMPLANTABLE CARDIOVERTER DEFIBRILLATOR GENERATOR CHANGE;  Surgeon: [name], MD;  Location: [facility] CATH LAB;  Service: Cardiovascular;  Laterality: N/A;   KYPHOPLASTY N/A 05/14/2021   Procedure: Lumbar Five Vertebroplasty;  Surgeon: Jadene Pierini, MD;  Location: [facility] OR;  Service: Neurosurgery;  Laterality: N/A;   LUMBAR LAMINECTOMY/ DECOMPRESSION WITH MET-RX N/A 05/14/2021   Procedure: Lumbar Four-Five Minimally Invasive Laminectomy;  Surgeon: Jadene Pierini, MD;  Location: [facility] OR;  Service: Neurosurgery;  Laterality: N/A;  3C/RM 19   TONSILLECTOMY     vt ablation  10/10, 10/13   x 2   WISDOM TOOTH EXTRACTION       A IV Location/Drains/Wounds Patient Lines/Drains/Airways Status     Active Line/Drains/Airways     Name Placement date Placement  time Site Days   Peripheral IV 05/19/23 18 G 1.16" Left Antecubital 05/19/23  1640  Antecubital  1   Incision - 4 Ports Abdomen 08/01/21  1203  -- 657            Intake/Output Last 24 hours  Intake/Output Summary (Last 24 hours) at 05/20/2023 1238 Last data filed at 05/20/2023 1238 Gross per 24 hour  Intake 50 ml  Output 1410 ml  Net -1360 ml    Labs/Imaging Results for orders placed or performed during the hospital encounter of 05/19/23 (from  the past 48 hour(s))  CBC with Differential     Status: None   Collection Time: 05/19/23  4:41 PM  Result Value Ref Range   WBC 7.8 4.0 - 10.5 K/uL   RBC 4.63 4.22 - 5.81 MIL/uL   Hemoglobin 14.3 13.0 - 17.0 g/dL   HCT 16.1 09.6 - 04.5 %   MCV 91.8 80.0 - 100.0 fL   MCH 30.9 26.0 - 34.0 pg   MCHC 33.6 30.0 - 36.0 g/dL   RDW 40.9 81.1 - 91.4 %   Platelets 224 150 - 400 K/uL   nRBC 0.0 0.0 - 0.2 %   Neutrophils Relative % 42 %   Neutro Abs 3.4 1.7 - 7.7 K/uL   Lymphocytes Relative 42 %   Lymphs Abs 3.3 0.7 - 4.0 K/uL   Monocytes Relative 11 %   Monocytes Absolute 0.8 0.1 - 1.0 K/uL   Eosinophils Relative 3 %   Eosinophils Absolute 0.2 0.0 - 0.5 K/uL   Basophils Relative 1 %   Basophils Absolute 0.1 0.0 - 0.1 K/uL   Immature Granulocytes 1 %   Abs Immature Granulocytes 0.04 0.00 - 0.07 K/uL    Comment: Performed at [facility], [address], [place], [place] [address]  Comprehensive metabolic panel     Status: Abnormal   Collection Time: 05/19/23  4:41 PM  Result Value Ref Range   Sodium 140 135 - 145 mmol/L   Potassium 3.9 3.5 - 5.1 mmol/L   Chloride 102 98 - 111 mmol/L   CO2 26 22 - 32 mmol/L   Glucose, Bld 127 (H) 70 - 99 mg/dL    Comment: Glucose reference range applies only to samples taken after fasting for at least 8 hours.   BUN 6 6 - 20 mg/dL   Creatinine, Ser 6.21 0.61 - 1.24 mg/dL   Calcium 9.4 8.9 - 30.8 mg/dL   Total Protein 6.8 6.5 - 8.1 g/dL   Albumin 4.1 3.5 - 5.0 g/dL   AST 657 (H) 15 - 41 U/L   ALT 118 (H) 0 - 44 U/L   Alkaline Phosphatase 36 (L) 38 - 126 U/L   Total Bilirubin 0.6 0.3 - 1.2 mg/dL   GFR, Estimated >84 >69 mL/min    Comment: (NOTE) Calculated using the CKD-EPI Creatinine Equation (2021)    Anion gap 12 5 - 15    Comment: Performed at [facility], [address], [place], [place] [address]  Magnesium     Status: None   Collection Time: 05/19/23  4:41 PM  Result Value Ref Range   Magnesium 1.8 1.7 - 2.4 mg/dL     Comment: Performed at [facility], [address], [place], [place] [address]  Troponin I (High Sensitivity)     Status: None   Collection Time: 05/19/23  4:41 PM  Result Value Ref Range   Troponin I (High Sensitivity) 17 <18  ng/L    Comment: (NOTE) Elevated high sensitivity troponin I (hsTnI) values and significant  changes across serial measurements may suggest ACS but many other  chronic and acute conditions are known to elevate hsTnI results.  Refer to the "Links" section for chest pain algorithms and additional  guidance. Performed at [facility], [address], [place], [place] [address]   Troponin I (High Sensitivity)     Status: Abnormal   Collection Time: 05/19/23  7:35 PM  Result Value Ref Range   Troponin I (High Sensitivity) 25 (H) <18 ng/L    Comment: (NOTE) Elevated high sensitivity troponin I (hsTnI) values and significant  changes across serial measurements may suggest ACS but many other  chronic and acute conditions are known to elevate hsTnI results.  Refer to the "Links" section for chest pain algorithms and additional  guidance. Performed at [facility], [address], [place], [place] [address]   HIV Antibody (routine testing w rflx)     Status: None   Collection Time: 05/19/23  7:35 PM  Result Value Ref Range   HIV Screen 4th Generation wRfx Non Reactive Non Reactive    Comment: Performed at [facility], [address], [place], [place] [address]  Comprehensive metabolic panel     Status: Abnormal   Collection Time: 05/20/23  3:41 AM  Result Value Ref Range   Sodium 139 135 - 145 mmol/L   Potassium 3.5 3.5 - 5.1 mmol/L   Chloride 107 98 - 111 mmol/L   CO2 24 22 - 32 mmol/L   Glucose, Bld 97 70 - 99 mg/dL    Comment: Glucose reference range applies only to samples taken after fasting for at least 8 hours.   BUN <5 (L) 6 - 20 mg/dL   Creatinine, Ser 9.14 0.61 - 1.24 mg/dL   Calcium 8.3 (L) 8.9 - 10.3 mg/dL   Total Protein  6.0 (L) 6.5 - 8.1 g/dL   Albumin 3.5 3.5 - 5.0 g/dL   AST 59 (H) 15 - 41 U/L   ALT 87 (H) 0 - 44 U/L   Alkaline Phosphatase 31 (L) 38 - 126 U/L   Total Bilirubin 0.5 0.3 - 1.2 mg/dL   GFR, Estimated >78 >29 mL/min    Comment: (NOTE) Calculated using the CKD-EPI Creatinine Equation (2021)    Anion gap 8 5 - 15    Comment: Performed at [facility], [address] [place], [place] [address]  Magnesium     Status: None   Collection Time: 05/20/23  3:41 AM  Result Value Ref Range   Magnesium 2.2 1.7 - 2.4 mg/dL    Comment: Performed at [facility], [address] [place], [place] [address]   No results found.  Pending Labs Unresulted Labs (From admission, onward)     Start     Ordered   05/26/23 0500  Creatinine, serum  (enoxaparin (LOVENOX)    CrCl >/= 30 ml/min)  Weekly,   R     Comments: while on enoxaparin therapy    05/19/23 1925            Vitals/Pain Today's Vitals   05/20/23 1200 05/20/23 1230 05/20/23 1237 05/20/23 1237  BP: 119/65 99/68    Pulse:      Resp: 14 (!) 8    Temp:   98.6 F (37 C)   TempSrc:   Oral   SpO2:      PainSc:  0-No pain 0-No pain    Isolation Precautions No active isolations  Medications Medications  nitroGLYCERIN (NITROSTAT) SL tablet 0.4 mg (has no administration in time range)  acetaminophen (TYLENOL) tablet 650 mg (has no administration in time range)  ondansetron (ZOFRAN) injection 4 mg (has no administration in time range)  ALPRAZolam (XANAX) tablet 0.25 mg (0.25 mg Oral Given 05/19/23 1945)  enoxaparin (LOVENOX) injection 40 mg (40 mg Subcutaneous Given 05/19/23 1946)  sodium chloride flush (NS) 0.9 % injection 3 mL (3 mLs Intravenous Given 05/20/23 1019)  sodium chloride flush (NS) 0.9 % injection 3 mL (has no administration in time range)  0.9 %  sodium chloride infusion (has no administration in time range)  zolpidem (AMBIEN) tablet 5 mg (has no administration in time range)  magnesium oxide (MAG-OX)  tablet 400 mg (400 mg Oral Given 05/20/23 1014)  montelukast (SINGULAIR) tablet 10 mg (10 mg Oral Given 05/19/23 2113)  nebivolol (BYSTOLIC) tablet 5 mg (5 mg Oral Given 05/19/23 2212)  flecainide (TAMBOCOR) tablet 50 mg (50 mg Oral Given 05/20/23 1018)  sotalol (BETAPACE) tablet 180 mg (180 mg Oral Given 05/20/23 1013)  potassium chloride SA (KLOR-CON M) CR tablet 40 mEq (40 mEq Oral Given 05/20/23 1013)  magnesium sulfate IVPB 2 g 50 mL (0 g Intravenous Stopped 05/19/23 2037)  influenza vac split trivalent PF (FLULAVAL) injection 0.5 mL (0.5 mLs Intramuscular Given 05/20/23 1101)    Mobility walks     Focused Assessments See chart   R Recommendations: See Admitting Provider Note  Report given to:   Additional Notes: see chart

## 2023-05-20 NOTE — Progress Notes (Signed)
Telemetry reviewed, marked improvement in PVC burden No VT or NSVTs since yesterday EKG with stable intervals/QTc with the addition of flecainide VSS K+ replacement has been addressed already LFTs down-trending  Full consult note will follow  Francis Dowse, PA-C

## 2023-05-20 NOTE — ED Notes (Signed)
Pt ambulated to restroom independently with normal gait.

## 2023-05-21 ENCOUNTER — Telehealth: Payer: Self-pay

## 2023-05-21 ENCOUNTER — Encounter (HOSPITAL_COMMUNITY): Payer: Self-pay | Admitting: Cardiovascular Disease

## 2023-05-21 DIAGNOSIS — I472 Ventricular tachycardia, unspecified: Secondary | ICD-10-CM | POA: Diagnosis not present

## 2023-05-21 LAB — BASIC METABOLIC PANEL
Anion gap: 7 (ref 5–15)
BUN: 7 mg/dL (ref 6–20)
CO2: 24 mmol/L (ref 22–32)
Calcium: 8.6 mg/dL — ABNORMAL LOW (ref 8.9–10.3)
Chloride: 108 mmol/L (ref 98–111)
Creatinine, Ser: 0.88 mg/dL (ref 0.61–1.24)
GFR, Estimated: >60 mL/min (ref >60)
Glucose, Bld: 148 mg/dL — ABNORMAL HIGH (ref 70–99)
Potassium: 3.8 mmol/L (ref 3.5–5.1)
Sodium: 139 mmol/L (ref 135–145)

## 2023-05-21 LAB — MAGNESIUM: Magnesium: 2 mg/dL (ref 1.7–2.4)

## 2023-05-21 MED ORDER — ENOXAPARIN SODIUM 40 MG/0.4ML IJ SOSY: 40.0000 mg | PREFILLED_SYRINGE | INTRAMUSCULAR | Status: DC

## 2023-05-21 MED ORDER — FLECAINIDE ACETATE 50 MG PO TABS: 50.0000 mg | ORAL_TABLET | Freq: Two times a day (BID) | ORAL | Status: DC

## 2023-05-21 MED ORDER — ONDANSETRON HCL 4 MG/2ML IJ SOLN: 4.0000 mg | Freq: Four times a day (QID) | INTRAMUSCULAR | Status: DC | PRN

## 2023-05-21 MED ORDER — ZOLPIDEM TARTRATE 5 MG PO TABS: 5.0000 mg | ORAL_TABLET | Freq: Every evening | ORAL | Status: DC | PRN

## 2023-05-21 MED ORDER — RANOLAZINE ER 500 MG PO TB12
500.0000 mg | ORAL_TABLET | Freq: Two times a day (BID) | ORAL | Status: DC
  Administered 2023-05-21 (×2): 500 mg via ORAL
  Filled 2023-05-21 (×2): qty 1

## 2023-05-21 MED ORDER — RANOLAZINE ER 500 MG PO TB12: 500.0000 mg | ORAL_TABLET | Freq: Two times a day (BID) | ORAL | Status: DC

## 2023-05-21 MED ORDER — SODIUM CHLORIDE 0.9 % IV SOLN: 250.0000 mL | INTRAVENOUS | Status: DC | PRN

## 2023-05-21 MED ORDER — ALPRAZOLAM 0.25 MG PO TABS: 0.2500 mg | ORAL_TABLET | Freq: Three times a day (TID) | ORAL | Status: DC | PRN

## 2023-05-21 MED ORDER — ACETAMINOPHEN 325 MG PO TABS: 650.0000 mg | ORAL_TABLET | ORAL | Status: DC | PRN

## 2023-05-21 MED ORDER — SODIUM CHLORIDE 0.9% FLUSH: 3.0000 mL | INTRAVENOUS | Status: DC | PRN

## 2023-05-21 MED ORDER — SODIUM CHLORIDE 0.9% FLUSH: 3.0000 mL | Freq: Two times a day (BID) | INTRAVENOUS | Status: DC

## 2023-05-21 MED ORDER — NITROGLYCERIN 0.4 MG SL SUBL: 0.4000 mg | SUBLINGUAL_TABLET | SUBLINGUAL | Status: DC | PRN

## 2023-05-21 MED ORDER — ALPRAZOLAM 0.25 MG PO TABS
0.2500 mg | ORAL_TABLET | Freq: Three times a day (TID) | ORAL | Status: DC | PRN
  Administered 2023-05-21 (×2): 0.25 mg via ORAL
  Filled 2023-05-21 (×2): qty 1

## 2023-05-21 NOTE — Telephone Encounter (Signed)
Endoscopy department contacted office to let us know that patient of Dr. Vilma Prader is currently in the hospital.  His colonoscopy is scheduled 06/03/23.  Pt will need to reschedule his procedure.  Thanks, [place], [place]

## 2023-05-21 NOTE — Progress Notes (Signed)
Reassessment completed by Dr Brayton Layman prior transfer to [facility].  Vitals signs are as follows: BP  112/77, HR 64, RR 18, SPO2 96 on RA. Pt denies any chest pain, dizziness and SOB. Pt was taken by [facility]link for transfer to [facility].

## 2023-05-21 NOTE — Discharge Summary (Signed)
ELECTROPHYSIOLOGY DISCHARGE SUMMARY    Patient ID: [name],  MRN: [id number], DOB/AGE: 20-Sep-1975 47 y.o.  Admit date: 05/19/2023 Discharge date: 05/21/2023  Primary Care Physician: [name], [name], MD  Primary Cardiologist: Sherryl Manges, MD  Electrophysiologist: Dr. Graciela Husbands   Primary Discharge Diagnosis:  Ventricular Tachycardia   Secondary Discharge Diagnosis:  Arrhythmogenic RV Cardiomyopathy  ICD in Situ Fatty Liver  OSA on CPAP        Brief HPI: [name] is a 47 y.o. 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  He was initially diagnosed in 2007 after episodes of chest pain & palpitations.  He had an MDT ICD implantation (gen change 10/2019).  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  In 2010, he had an endo-epicardiac ablation by Dr. Betti Cruz at [facility] [place].  In 11/2011, he had a second ablation at [facility] (had up to 9 shocks during his medication wash out period before he could make it to planned procedure).  He is chronically on sotalol, nebivolol + Mg, fatty liver & OSA on CPAP. He had a hospital admission in 02/2023 for VT with shock after a change in his magnesium supplementation.   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 However, upon review of his medical records at death he had some kind of arrhythmia and heart tissue changes on autopsy that suggest he may have had ARVC. Paternal GM with WPW, SVT.    The patient presented to [facility] on 05/19/2023 with admitted with VT s/p HV therapy by ICD. He was symptomatic with palpitations, pre-syncope symptoms but did not pass out. Device interrogation showed VT episode on 10/1 treated in FVT Zone. CL VT 290-262ms > ATP broke for one beat, then immediately returned to VT and accelerates to CL of 200-229ms, then HV therapy at 36.2j which converted him to ST. Total episode lasted 9 seconds.    He was admitted to hospital for further evaluation. Home medications were continued. Flecainide 50 mg BID was added with ~ 5-10% reduction of PVC burden. Pt continued to have frequent PVC's, NSVT.  AM of 10/3, he had additional VT run of ~ 20 beats. Ranolazine 500 mg BID was added 10/3.  He was given potassium and magnesium supplementation (on chronically as well). Dr. Constance Haw was contacted regarding evaluation for repeat ARVC ablation and accepted patient for transfer.   EKG: 10/1 SR 71, PVC's with 2 morphologies, QTc acceptable on sotalol  (personally reviewed) 10/2 SR 60, stable intervals compared to 10/1 EKG, flecainide added    TELEMETRY: SR 60's with frequent PVC's, couplets, NSVT (personally reviewed)   DEVICE HISTORY:  MDT single chamber ICD > generator change 11/04/2019   STUDIES: ECHO 02/2023 > LVEF 60-65%, no RWMA, RV apex appears hypertrabeculated, RV systolic function normal, RV mildly enlarged, normal PASP, normal mitral valve structure with trivial MVR, no MV stenosis, AV normal in structure, AV regurgitation not visualized, no AS  Physical Exam: Vitals:   05/21/23 0449 05/21/23 0734 05/21/23 1103 05/21/23 1550  BP: 113/75 113/86 96/66 106/79  Pulse: (!) 57 (!) 58 (!) 53 (!) 55  Resp: 18 18 19 20   Temp: 98.1 F (36.7 C) 98.2 F (36.8 C) 98.3 F (36.8 C) 98 F (36.7 C)  TempSrc: Oral Oral Oral Oral  SpO2: 97% 95% 94% 94%    GEN- pleasant adult male sitting  up in bed in NAD HEENT: MM pink / moist, anicteric, good dentition  Lungs- CTAB, Normal effort.  Heart- RRR, No M/G/R.  GI- Soft,  ND.  Extremities- No clubbing, cyanosis, or edema    Discharge HOSPITAL Medications:  Allergies as of 05/21/2023       Reactions   Bee Venom Anaphylaxis   Lidocaine Other (See Comments)   IV formulation [name]        Medication List     TAKE these medications    acetaminophen 325 MG tablet Commonly known as: TYLENOL Take 2 tablets (650 mg total) by mouth  every 4 (four) hours as needed for headache or mild pain.   alendronate 70 MG tablet Commonly known as: FOSAMAX Take 70 mg by mouth every Sunday.   ALPRAZolam 0.25 MG tablet Commonly known as: XANAX Take 1 tablet (0.25 mg total) by mouth 3 (three) times daily as needed for anxiety. What changed:  medication strength how much to take when to take this   benzonatate 200 MG capsule Commonly known as: TESSALON Take 1 capsule (200 mg total) by mouth 3 (three) times daily as needed for cough. Swallow whole   CITRACAL + D PO Take 1 tablet by mouth 2 (two) times daily.   enoxaparin 40 MG/0.4ML injection Commonly known as: LOVENOX Inject 0.4 mLs (40 mg total) into the skin daily.   flecainide 50 MG tablet Commonly known as: TAMBOCOR Take 1 tablet (50 mg total) by mouth every 12 (twelve) hours.   Klor-Con M10 10 MEQ tablet Generic drug: potassium chloride TAKE 4 TABLETS BY MOUTH TWICE A DAY   magnesium oxide 400 (240 Mg) MG tablet Commonly known as: MAG-OX TAKE 1 TABLET BY MOUTH TWICE A DAY   montelukast 10 MG tablet Commonly known as: SINGULAIR TAKE 1 TABLET BY MOUTH EVERY DAY What changed: when to take this   multivitamin with minerals Tabs tablet Take 1 tablet by mouth daily.   nebivolol 5 MG tablet Commonly known as: BYSTOLIC Take 1 tablet (5 mg total) by mouth daily. What changed: when to take this   nitroGLYCERIN 0.4 MG SL tablet Commonly known as: NITROSTAT Place 1 tablet (0.4 mg total) under the tongue every 5 (five) minutes x 3 doses as needed for chest pain.   ondansetron 4 MG/2ML Soln injection Commonly known as: ZOFRAN Inject 2 mLs (4 mg total) into the vein every 6 (six) hours as needed for nausea.   ranolazine 500 MG 12 hr tablet Commonly known as: RANEXA Take 1 tablet (500 mg total) by mouth 2 (two) times daily.   sodium chloride 0.9 % infusion Inject 250 mLs into the vein as needed (for IV line care  (Saline / Heparin Lock)).   sodium chloride  flush 0.9 % Soln Commonly known as: NS Inject 3 mLs into the vein every 12 (twelve) hours.   sodium chloride flush 0.9 % Soln Commonly known as: NS Inject 3 mLs into the vein as needed.   SOTALOL AF 120 MG Tabs TAKE 1 & 1/2 TABLETS BY MOUTH TWICE DAILY   tadalafil 5 MG tablet Commonly known as: CIALIS Take 5 mg by mouth daily as needed for erectile dysfunction.   VITAMIN D-3 PO Take 1 capsule by mouth at bedtime.   ZINC PO Take 1 tablet by mouth at bedtime.   zolpidem 5 MG tablet Commonly known as: AMBIEN Take 1 tablet (5 mg total) by mouth at bedtime as needed and may repeat dose one time if  needed for sleep.        Prior Home Medications  Current Meds  Medication Sig   alendronate (FOSAMAX) 70 MG tablet Take 70 mg by mouth every Sunday.   ALPRAZolam (XANAX) 0.5 MG tablet Take 1 tablet (0.5 mg total) by mouth at bedtime as needed for anxiety.   benzonatate (TESSALON) 200 MG capsule Take 1 capsule (200 mg total) by mouth 3 (three) times daily as needed for cough. Swallow whole   Calcium Citrate-Vitamin D (CITRACAL + D PO) Take 1 tablet by mouth 2 (two) times daily.   Cholecalciferol (VITAMIN D-3 PO) Take 1 capsule by mouth at bedtime.   KLOR-CON M10 10 MEQ tablet TAKE 4 TABLETS BY MOUTH TWICE A DAY   magnesium oxide (MAG-OX) 400 (240 Mg) MG tablet TAKE 1 TABLET BY MOUTH TWICE A DAY   montelukast (SINGULAIR) 10 MG tablet TAKE 1 TABLET BY MOUTH EVERY DAY (Patient taking differently: Take 10 mg by mouth at bedtime.)   Multiple Vitamin (MULITIVITAMIN WITH MINERALS) TABS Take 1 tablet by mouth daily.   Multiple Vitamins-Minerals (ZINC PO) Take 1 tablet by mouth at bedtime.   nebivolol (BYSTOLIC) 5 MG tablet Take 1 tablet (5 mg total) by mouth daily. (Patient taking differently: Take 5 mg by mouth at bedtime.)   SOTALOL AF 120 MG TABS TAKE 1 & 1/2 TABLETS BY MOUTH TWICE DAILY   tadalafil (CIALIS) 5 MG tablet Take 5 mg by mouth daily as needed for erectile dysfunction.        Disposition:  [facility]  with usual follow up as in AVS  Duration of Discharge Encounter: Greater than 30 minutes including physician time.  Signed, Canary Brim, MSN, APRN, NP-C, AGACNP-BC [facility] - Electrophysiology  05/21/2023, 4:36 PM

## 2023-05-21 NOTE — Progress Notes (Addendum)
Patient Name: [name] Date of Encounter: 05/21/2023  Primary Cardiologist: Sherryl Manges, MD Electrophysiologist: Sherryl Manges, MD  Interval Summary   The patient reports he is upset that he is "back in the place he is in" in terms of his rhythm.  He feels like he "had a good run"  At this time, the patient denies chest pain, shortness of breath, or any new concerns.  Vital Signs    Vitals:   05/20/23 2036 05/21/23 0011 05/21/23 0449 05/21/23 0734  BP: 109/66 96/68 113/75 113/86  Pulse:  (!) 57 (!) 57 (!) 58  Resp:  17 18 18   Temp:   98.1 F (36.7 C) 98.2 F (36.8 C)  TempSrc:   Oral Oral  SpO2:  95% 97% 95%    Intake/Output Summary (Last 24 hours) at 05/21/2023 0737 Last data filed at 05/20/2023 2030 Gross per 24 hour  Intake 220 ml  Output 810 ml  Net -590 ml   There were no vitals filed for this visit.  Physical Exam    GEN- The patient is well appearing, alert and oriented x 3 today.   Lungs- Clear to ausculation bilaterally, normal work of breathing Cardiac- Regular rate and rhythm, frequent PVC's, no murmurs, rubs or gallops GI- soft, NT, ND, + BS Extremities- no clubbing or cyanosis. No edema  Telemetry    SR 60-70's, frequent PVC's, occasional couplets / NSVT up to 20 beats.  Less frequent than before starting flecainide (personally reviewed)   EKG: 10/1 SR 71, PVC's with 2 morphologies, QTc acceptable on sotalol  (personally reviewed) 10/2 SR 60, stable intervals compared to 10/1 EKG, flecainide added    TELEMETRY: SR 60's with PVC's (personally reviewed)   DEVICE HISTORY:  MDT single chamber ICD > generator change 11/04/2019   STUDIES: ECHO 02/2023 > LVEF 60-65%    Hospital Course    ZANIEL MARINEAU is a 47 y.o. male with a history of arrhythmogenic RV cardiomyopathy, VT s/p MDT ICD (gen change 10/2019), VT catheter ablation at [facility], endo & epi-cardial ablation at [facility], on chronic sotalol, nebivolol + Mg, fatty liver & OSA on CPAP admitted  with VT s/p defib by ICD. Home medications were continued and flecainide was added with ~ 5-10% reduction of PVC burden. Pt continues to have frequent PVC's, NSVT.    Assessment & Plan    Monomorphic VT  Device interrogation 05/19/23 showed normal lead measurements and battery status. VT episode on 10/1 treated in FVT Zone.  CL VT 290-220ms > ATP broke for one beat, then immediately returned to VT and accelerates to CL of 200-23ms, then HV therapy at 36.2j which converted him to ST.  Total episode lasted 9 seconds.  -continue nebivolol, sotalol at home doses  -continue flecainide 50 mg BID  -add ranolazone 500 mg BID  -EKG with stable intervals on current antiarrhythmic regimen  -normal EF, no known coronary calcium socre or LHC  -will need EKG in 2 weeks and ETT  -BMP, Mg this am -goal K+>4, Mg+>2  For questions or updates, please contact [facility] Please consult [email or web address] for contact info under Cardiology/STEMI.  Signed, Canary Brim, MSN, APRN, NP-C, AGACNP-BC [facility] - Electrophysiology  05/21/2023, 8:11 AM Ventricular tachycardia-recurrent-monomorphic  Arrhythmogenic RV cardiomyopathy  Implantable defibrillator-Medtronic  Anxiety   Patient with recurrent ventricular tachycardia in the context of arrhythmogenic RV cardiomyopathy.  Breaking through sotalol.  Had recurrent prolonged nonsustained ventricular tachycardia in the setting of sotalol plus flecainide.  Dr. Lalla Brothers  has discussed the patient with Dr. Wynelle Link at [facility] concerning possible transfer for catheter ablation.  Patient agreeable

## 2023-05-21 NOTE — Plan of Care (Signed)
  Problem: Activity: Goal: Ability to tolerate increased activity will improve Outcome: Progressing   Problem: Cardiac: Goal: Ability to achieve and maintain adequate cardiovascular perfusion will improve Outcome: Progressing   Problem: Health Behavior/Discharge Planning: Goal: Ability to manage health-related needs will improve Outcome: Progressing   Problem: Clinical Measurements: Goal: Ability to maintain clinical measurements within normal limits will improve Outcome: Progressing Goal: Will remain free from infection Outcome: Progressing Goal: Respiratory complications will improve Outcome: Progressing Goal: Cardiovascular complication will be avoided Outcome: Progressing   Problem: Activity: Goal: Risk for activity intolerance will decrease Outcome: Progressing   Problem: Nutrition: Goal: Adequate nutrition will be maintained Outcome: Progressing   Problem: Coping: Goal: Level of anxiety will decrease Outcome: Progressing   Problem: Safety: Goal: Ability to remain free from injury will improve Outcome: Progressing

## 2023-05-21 NOTE — Progress Notes (Signed)
Pt seen prior to discharge to [facility] 05/21/23:at 10pm  He denies any new complaints, vitals stable, exam stable, Tele reviewed- just NSVT, no sustained episodes. He is going to be transferred via Critical care EMS to [facility].  Pt aware, agreeable.  Discharged to [facility]  Elmon Kirschner

## 2023-05-22 ENCOUNTER — Encounter: Payer: Self-pay | Admitting: Internal Medicine

## 2023-05-22 DIAGNOSIS — M8080XS Other osteoporosis with current pathological fracture, unspecified site, sequela: Secondary | ICD-10-CM

## 2023-05-25 ENCOUNTER — Inpatient Hospital Stay: Admission: RE | Admit: 2023-05-25 | Payer: BC Managed Care – PPO | Source: Ambulatory Visit

## 2023-05-26 ENCOUNTER — Ambulatory Visit: Payer: BC Managed Care – PPO | Admitting: Clinical

## 2023-05-27 ENCOUNTER — Ambulatory Visit: Payer: BC Managed Care – PPO

## 2023-06-02 ENCOUNTER — Encounter: Payer: Self-pay | Admitting: Internal Medicine

## 2023-06-02 ENCOUNTER — Ambulatory Visit (INDEPENDENT_AMBULATORY_CARE_PROVIDER_SITE_OTHER): Payer: BC Managed Care – PPO | Admitting: Clinical

## 2023-06-02 ENCOUNTER — Encounter: Payer: Self-pay | Admitting: Family Medicine

## 2023-06-02 DIAGNOSIS — F439 Reaction to severe stress, unspecified: Secondary | ICD-10-CM

## 2023-06-02 DIAGNOSIS — F411 Generalized anxiety disorder: Secondary | ICD-10-CM

## 2023-06-02 MED ORDER — ALPRAZOLAM 0.25 MG PO TABS: 0.2500 mg | ORAL_TABLET | Freq: Two times a day (BID) | ORAL | 0 refills | Status: DC | PRN

## 2023-06-02 NOTE — Progress Notes (Signed)
[facility] Behavioral Health Counselor Initial Adult Exam  Name: Karver Arenz Date: 06/02/2023 MRN: [id number] DOB: 02-Dec-1975 PCP: Judy Pimple, MD  Time spent: 10:40am - 11:36am   Guardian/Payee:  NA    Paperwork requested:  NA  Reason for Visit /Presenting Problem: Patient stated, "Lavenia Atlas been dealing with a lot of PTSD for a long time". Patient stated, "in July my defibrillator fired to get my heart right and it has been a long time since then". Patient stated, "it put me back into the same PTSD head space as it did 10 years ago".    Mental Status Exam: Appearance:   Neat and Well Groomed     Behavior:  Appropriate  Motor:  Normal  Speech/Language:   Clear and Coherent  Affect:  Flat  Mood:  anxious  Thought process:  normal  Thought content:    WNL  Sensory/Perceptual disturbances:    WNL  Orientation:  oriented to person, place, and situation  Attention:  Good  Concentration:  Good  Memory:  WNL  Fund of knowledge:   Good  Insight:    Fair  Judgment:   Good  Impulse Control:  Fair   Reported Symptoms:  Patient stated, "I've been living with a lot of anxiety every day", worry, stated "I snap at my wife when I shouldn't snap at my wife", irritability, stated "a certain amount of things that I can handle and then when it gets to that point its rage", stated "panic attacks all the time" (unable to stop shaking, "endless loop in my head that I can't stop", "I literally can't do anything", feels heart is racing all the time, chills), muscle tension, difficulty falling asleep and staying asleep, stated "reliving from 2007 to now all of the events that have happened until now", stated "I want to like scream all the time but I try to keep some control", nightmares about heart being shocked, flashbacks, stated "it's like I relive it (medical events) over and over and over", fatigue, and intrusive thoughts  Risk Assessment: Danger to Self:  No Patient denied current and past  suicidal ideation and symptoms of psychosis.  Self-injurious Behavior: No Danger to Others: No Patient denied current and past homicidal ideation Duty to Warn:no Physical Aggression / Violence:No  Access to Firearms a concern: No but reported access and reported firearms are kept in a locked box Gang Involvement:No  Patient / guardian was educated about steps to take if suicide or homicide risk level increases between visits: yes While future psychiatric events cannot be accurately predicted, the patient does not currently require acute inpatient psychiatric care and does not currently meet [facility] involuntary commitment criteria.  Substance Abuse History: Current substance abuse: No  current use. Patient reported a history of smoking marijuana as a teenager, history of alcohol use and reported he stopped alcohol use in 2010, history of tobacco use and reported he stopped tobacco use in 2009.  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 Victim of Neglect:No. 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  Family History:  Family History  Problem Relation Age of Onset   Sudden death Father    Asthma Mother    Diabetes Other    Stroke Other    Heart attack Other    Prostate cancer Other    Breast cancer Other    Ovarian cancer Other    Uterine cancer Other    Colon cancer Other    Drug abuse Other     Depression Other    Lung cancer Maternal Grandfather    Heart disease Maternal Grandfather    Heart disease Paternal Grandfather    Hypertension Paternal Grandmother   ARVC -sister per patient's report 06/02/23  Living situation: the patient lives with their family (wife, 2 daughters)  Sexual Orientation: Straight  Relationship Status: married  Name of spouse / other: Victorino Dike If a parent, number of children / ages: 2 children ages 63, 48 months)  Support Systems: spouse Mother, aunt, cousins  Surveyor, quantity Stress:  Yes   Income/Employment/Disability: Short-Term Disability and currently restricted from driving for 6 months due to medical condition  Military Service: No   Educational History: Education: Risk manager: Could not assess  Any cultural differences that may affect / interfere with treatment:  could not assess  Recreation/Hobbies: print making/art work  Stressors: Surveyor, quantity difficulties   Health problems   Occupational concerns    Strengths: Patient reported he tries to use breathing exercises and meditation, watching movies  Barriers:  physical barriers due to back pain, recent broken wrist   Legal History: Pending legal issue / charges:  none. 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   Asthma when younger   Fatty liver    History of chicken pox    History of kidney stones    MDT DVFB1D1 Visia AF MRI VR  ICD (implantable cardiac defibrillator) in place    Osteopenia    Pancreatitis    Sleep apnea    uses cpap   Ventricular tachycardia (HCC)    sotalol therapy;  catheter ablation at [facility] 10/10 and [facility] 2013    Past Surgical History:   Procedure Laterality Date   CARDIAC CATHETERIZATION  06/01/2006   CARDIAC DEFIBRILLATOR PLACEMENT  06/03/2006   [employer]   CHOLECYSTECTOMY N/A 08/01/2021   Procedure: LAPAROSCOPIC CHOLECYSTECTOMY WITH INTRAOPERATIVE CHOLANGIOGRAM;  Surgeon: Harriette Bouillon, MD;  Location: [facility] ORS;  Service: General;  Laterality: N/A;   HAND SURGERY Left    ICD GENERATOR CHANGEOUT N/A 11/04/2019   Procedure: ICD GENERATOR CHANGEOUT;  Surgeon: [name], MD;  Location: [facility] INVASIVE CV LAB;  Service: Cardiovascular;  Laterality: N/A;   IMPLANTABLE CARDIOVERTER DEFIBRILLATOR GENERATOR CHANGE N/A 12/11/2011   Procedure: IMPLANTABLE CARDIOVERTER DEFIBRILLATOR GENERATOR CHANGE;  Surgeon: [name], MD;  Location: [facility] CATH LAB;  Service: Cardiovascular;  Laterality: N/A;   KYPHOPLASTY N/A 05/14/2021   Procedure: Lumbar Five Vertebroplasty;  Surgeon: Jadene Pierini, MD;  Location: [facility] OR;  Service: Neurosurgery;  Laterality: N/A;   LUMBAR LAMINECTOMY/ DECOMPRESSION WITH MET-RX N/A 05/14/2021   Procedure: Lumbar Four-Five Minimally Invasive Laminectomy;  Surgeon: Jadene Pierini, MD;  Location: [facility] OR;  Service: Neurosurgery;  Laterality: N/A;  3C/RM 19   TONSILLECTOMY     vt ablation  10/10, 10/13   x 2   WISDOM TOOTH EXTRACTION      Medications: Current Outpatient Medications  Medication Sig Dispense Refill   acetaminophen (TYLENOL) 325 MG tablet Take 2 tablets (650 mg total) by mouth every 4 (four) hours as needed for headache or mild pain.     alendronate (FOSAMAX) 70 MG tablet Take 70 mg by mouth every Sunday.     ALPRAZolam (XANAX) 0.25 MG tablet Take 1 tablet (0.25 mg total) by mouth 3 (three) times daily as needed for anxiety.     benzonatate (TESSALON) 200 MG capsule Take 1 capsule (200 mg total) by mouth 3 (three) times daily as needed for cough. Swallow whole 30 capsule 1   Calcium Citrate-Vitamin D (CITRACAL + D PO) Take 1 tablet by mouth 2 (two) times daily.     Cholecalciferol  (VITAMIN D-3 PO) Take 1 capsule by mouth at bedtime.     enoxaparin (LOVENOX) 40 MG/0.4ML injection Inject 0.4 mLs (40 mg total) into the skin daily.     flecainide (TAMBOCOR) 50 MG tablet Take 1 tablet (50 mg total) by mouth every 12 (twelve) hours.     KLOR-CON M10 10 MEQ tablet TAKE 4 TABLETS BY MOUTH TWICE A DAY 720 tablet 3   magnesium oxide (MAG-OX) 400 (240 Mg) MG tablet TAKE 1 TABLET BY MOUTH TWICE A DAY 180 tablet 3   montelukast (SINGULAIR) 10 MG tablet TAKE 1 TABLET BY MOUTH EVERY DAY (Patient taking differently: Take 10 mg by mouth at bedtime.) 90 tablet 0   Multiple Vitamin (MULITIVITAMIN WITH MINERALS) TABS Take 1 tablet by mouth daily.     Multiple Vitamins-Minerals (ZINC PO) Take 1 tablet by mouth at bedtime.     nebivolol (BYSTOLIC) 5 MG tablet Take 1 tablet (5 mg total) by mouth daily. (Patient taking differently: Take 5 mg by mouth at bedtime.) 90 tablet 3   nitroGLYCERIN (NITROSTAT) 0.4 MG SL tablet Place 1 tablet (0.4 mg total) under the tongue every 5 (five) minutes x 3 doses as needed for chest pain.     ondansetron (ZOFRAN) 4 MG/2ML SOLN injection Inject 2 mLs (4 mg total) into the vein every 6 (six) hours as needed for nausea.     ranolazine (RANEXA) 500 MG 12 hr tablet Take 1 tablet (500 mg total) by mouth 2 (two) times daily.     sodium chloride 0.9 % infusion Inject 250 mLs into the vein as needed (for IV line care  (Saline / Heparin Lock)).     sodium chloride flush (NS) 0.9 % SOLN Inject 3 mLs into the vein every 12 (twelve) hours.     sodium chloride flush (NS) 0.9 % SOLN Inject 3 mLs into the vein as needed.     SOTALOL AF 120 MG TABS TAKE 1 & 1/2 TABLETS BY MOUTH TWICE DAILY 270 tablet 2   tadalafil (CIALIS) 5 MG tablet Take 5 mg by mouth daily as needed for erectile dysfunction.     zolpidem (AMBIEN) 5 MG tablet Take 1 tablet (5 mg total) by mouth at bedtime as needed and may repeat dose one time if needed for sleep.     No current facility-administered  medications for this visit.    Allergies  Allergen Reactions   Bee Venom Anaphylaxis   Lidocaine Other (See Comments)    IV formulation [name]  Diagnoses:  Trauma and stressor-related disorder  Generalized anxiety disorder R/O PTSD  Plan of Care: Patient is a 47 year old male who presented for an initial assessment. Clinician conducted initial assessment in person from clinician's office at [facility]. Patient reported the following symptoms: anxiety,  worry,  irritability, anger, panic attacks, muscle tension, difficulty falling asleep and staying asleep, nightmares about his heart being shocked, flashbacks, fatigue, and intrusive thoughts. Patient denied current and past suicidal ideation, homicidal ideation, and symptoms of psychosis.  Patient reported no current substance use. Patient reported a history of smoking marijuana as a teenager. Patient reported a history of alcohol use and reported he stopped alcohol use in 2010. Patient reported a history of tobacco use and reported he stopped tobacco use in 2009. Patient reported a history of participation in individual therapy for treatment of PTSD and Anxiety. Patient reported a history of participation in couples counseling. Patient reported no history of psychiatric hospitalizations. 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 Patient reported trauma associated with a diagnosis of ARVC and use of AICD for treatment of ARVC. Patient reported current medical conditions, finances, and occupational concerns as current stressors. Patient identified his wife, mother, aunt, and cousins as current supports.  It is recommended patient be referred to a psychiatrist for a medication management consult and recommended patient participate in individual therapy with a provider that specializes in treatment of trauma. Clinician reviewed treatment recommendations with patient. Discussed  clinician's scope of practice and the importance of connecting patient with a provider that specializes in treatment of trauma. Clinician will initiate a referral to a provider that can address patient's clinical needs. Patient reported he is open to a consultation with a psychiatrist and open to a referral to a provider that specializes in treatment of trauma.   Collaboration of Care: Other Patient requested to complete a consent for patient's PCP, Roxy Manns, MD at [facility]. Discussed consents required for referrals to appropriate providers.   Patient/Guardian was advised Release of Information must be obtained prior to any record release in order to collaborate their care with an outside provider.    Doree Barthel, LCSW

## 2023-06-02 NOTE — Addendum Note (Signed)
Addended by: Roxy Manns A on: 06/02/2023 08:31 PM   Modules accepted: Orders

## 2023-06-02 NOTE — Progress Notes (Signed)
                Dezi Schaner, LCSW 

## 2023-06-03 ENCOUNTER — Ambulatory Visit: Admit: 2023-06-03 | Payer: BC Managed Care – PPO | Admitting: Gastroenterology

## 2023-06-03 SURGERY — COLONOSCOPY WITH PROPOFOL
Anesthesia: General

## 2023-06-05 ENCOUNTER — Other Ambulatory Visit: Payer: Self-pay | Admitting: Family Medicine

## 2023-06-05 ENCOUNTER — Other Ambulatory Visit: Payer: Self-pay | Admitting: Internal Medicine

## 2023-06-05 NOTE — Telephone Encounter (Signed)
Pt has had a few acute appt but no recent f/u or CPE and no future appts., please advise

## 2023-06-15 ENCOUNTER — Encounter: Payer: Self-pay | Admitting: Internal Medicine

## 2023-06-17 DIAGNOSIS — I493 Ventricular premature depolarization: Secondary | ICD-10-CM | POA: Insufficient documentation

## 2023-06-19 ENCOUNTER — Encounter: Payer: Self-pay | Admitting: Internal Medicine

## 2023-06-19 ENCOUNTER — Ambulatory Visit: Payer: BC Managed Care – PPO | Attending: Internal Medicine | Admitting: Internal Medicine

## 2023-06-19 VITALS — BP 108/70 | HR 58 | Ht 69.0 in | Wt 203.4 lb

## 2023-06-19 DIAGNOSIS — I493 Ventricular premature depolarization: Secondary | ICD-10-CM | POA: Diagnosis not present

## 2023-06-19 DIAGNOSIS — R001 Bradycardia, unspecified: Secondary | ICD-10-CM | POA: Diagnosis not present

## 2023-06-19 DIAGNOSIS — I428 Other cardiomyopathies: Secondary | ICD-10-CM | POA: Diagnosis not present

## 2023-06-19 DIAGNOSIS — I472 Ventricular tachycardia, unspecified: Secondary | ICD-10-CM | POA: Diagnosis not present

## 2023-06-19 DIAGNOSIS — Z95810 Presence of automatic (implantable) cardiac defibrillator: Secondary | ICD-10-CM

## 2023-06-19 LAB — CUP PACEART INCLINIC DEVICE CHECK
Date Time Interrogation Session: 20241101103907
Implantable Lead Connection Status: 753985
Implantable Lead Implant Date: 20071018
Implantable Lead Location: 753860
Implantable Lead Model: 6947
Implantable Pulse Generator Implant Date: 20210319

## 2023-06-19 NOTE — Patient Instructions (Signed)
Medication Instructions:  Your physician recommends that you continue on your current medications as directed. Please refer to the Current Medication list given to you today.  *If you need a refill on your cardiac medications before your next appointment, please call your pharmacy*   Lab Work: None ordered.  If you have labs (blood work) drawn today and your tests are completely normal, you will receive your results only by: MyChart Message (if you have MyChart) OR A paper copy in the mail If you have any lab test that is abnormal or we need to change your treatment, we will call you to review the results.   Testing/Procedures: None ordered.    Follow-Up: At [facility], you and your health needs are our priority.  As part of our continuing mission to provide you with exceptional heart care, we have created designated Provider Care Teams.  These Care Teams include your primary Cardiologist (physician) and Advanced Practice Providers (APPs -  Physician Assistants and Nurse Practitioners) who all work together to provide you with the care you need, when you need it.  We recommend signing up for the patient portal called "MyChart".  Sign up information is provided on this After Visit Summary.  MyChart is used to connect with patients for Virtual Visits (Telemedicine).  Patients are able to view lab/test results, encounter notes, upcoming appointments, etc.  Non-urgent messages can be sent to your provider as well.   To learn more about what you can do with MyChart, go to [email or web address].    Your next appointment:   4 months with Dr Graciela Husbands

## 2023-06-19 NOTE — Progress Notes (Unsigned)
Patient Care Team: [name], [name], MD as PCP - General (Family Medicine) [name], MD as PCP - Cardiology (Cardiology) [name], MD as PCP - Electrophysiology (Cardiology)   HPI  [name] is a 47 y.o. 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    He is status post catheter ablation [facility] ) and [facility]   Recent increase in PVC burden; event recorder 2/24 about 15% (PVCs plus couplets) 2 morphologies, both within intrinsicoid deflection of about 80 ms.  Some fatigue and brain fog; beta-blocker alternatives were tried 4/24  Hospitalized 7/24 following ICD shock--apparently his endocrinologist had had him decrease his magnesium because of concerns of GI side effects preceding this event by about a week.  Chronic sotalol.  Discussions were had regarding amiodarone deferred given his young age, and augmented beta-blockers given his bradycardia   Hospital lysed 10/24 with recurrent ventricular tachycardia.  Flecainide was added to the sotalol.  He underwent ablation at [facility] 10/9 done endocardially.  Had episodes of sustained ventricular tachycardia following the procedure with consideration for recurrent epicardial ablation if recurrent ventricular tachycardia  Comes with many questions.  No interval palpitations.  Fatigue lethargy despondency   Second daughter was born about December 2023.        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   Asthma when younger   Fatty liver    History of chicken pox    History of kidney stones    MDT DVFB1D1 Visia AF MRI VR  ICD (implantable cardiac defibrillator) in place    Osteopenia    Pancreatitis    Sleep apnea    uses cpap   Ventricular tachycardia (HCC)    sotalol therapy;  catheter ablation at [facility] 10/10 and [facility] 2013    Past Surgical History:  Procedure Laterality Date   CARDIAC CATHETERIZATION  06/01/2006   CARDIAC DEFIBRILLATOR PLACEMENT  06/03/2006   Medtronic   CHOLECYSTECTOMY N/A 08/01/2021   Procedure: LAPAROSCOPIC CHOLECYSTECTOMY WITH INTRAOPERATIVE CHOLANGIOGRAM;  Surgeon: Harriette Bouillon, MD;  Location: [facility] ORS;  Service: General;  Laterality: N/A;   HAND SURGERY Left    ICD GENERATOR CHANGEOUT N/A 11/04/2019   Procedure: ICD GENERATOR CHANGEOUT;  Surgeon: [name], MD;  Location: [facility] INVASIVE CV LAB;  Service: Cardiovascular;  Laterality: N/A;   IMPLANTABLE CARDIOVERTER DEFIBRILLATOR GENERATOR CHANGE N/A 12/11/2011   Procedure: IMPLANTABLE CARDIOVERTER DEFIBRILLATOR GENERATOR CHANGE;  Surgeon: [name], MD;  Location: [facility] CATH LAB;  Service: Cardiovascular;  Laterality: N/A;   KYPHOPLASTY N/A 05/14/2021   Procedure: Lumbar Five Vertebroplasty;  Surgeon: Jadene Pierini, MD;  Location: [facility] OR;  Service: Neurosurgery;  Laterality: N/A;  LUMBAR LAMINECTOMY/ DECOMPRESSION WITH MET-RX N/A 05/14/2021   Procedure: Lumbar Four-Five Minimally Invasive Laminectomy;  Surgeon: Jadene Pierini, MD;  Location: [facility] OR;  Service: Neurosurgery;  Laterality: N/A;  3C/RM 19   TONSILLECTOMY     vt ablation  10/10, 10/13   x 2   WISDOM TOOTH EXTRACTION      Current Meds  Medication Sig   alendronate (FOSAMAX) 70 MG tablet Take 70 mg by mouth every Sunday.   ALPRAZolam (XANAX) 0.25 MG tablet Take 1 tablet (0.25 mg total) by mouth 2 (two) times daily as needed  for anxiety.   Calcium Citrate-Vitamin D (CITRACAL + D PO) Take 1 tablet by mouth 2 (two) times daily.   Cholecalciferol (VITAMIN D-3 PO) Take 1 capsule by mouth at bedtime.   fexofenadine (ALLEGRA) 180 MG tablet Take 180 mg by mouth daily.   flecainide (TAMBOCOR) 100 MG tablet Take 100 mg by mouth 2 (two) times daily.   magnesium oxide (MAG-OX) 400 (240 Mg) MG tablet TAKE 1 TABLET BY MOUTH TWICE A DAY   montelukast (SINGULAIR) 10 MG tablet Take 1 tablet (10 mg total) by mouth at bedtime.   Multiple Vitamin (MULITIVITAMIN WITH MINERALS) TABS Take 1 tablet by mouth daily.   Multiple Vitamins-Minerals (ZINC PO) Take 1 tablet by mouth at bedtime.   nebivolol (BYSTOLIC) 5 MG tablet Take 1 tablet (5 mg total) by mouth daily. (Patient taking differently: Take 5 mg by mouth at bedtime.)   potassium chloride (KLOR-CON M10) 10 MEQ tablet TAKE 4 TABLETS BY MOUTH TWICE A DAY   SOTALOL AF 120 MG TABS TAKE 1 & 1/2 TABLETS BY MOUTH TWICE DAILY   spironolactone (ALDACTONE) 25 MG tablet Take 0.5 tablets by mouth daily.   tadalafil (CIALIS) 5 MG tablet Take 5 mg by mouth daily as needed for erectile dysfunction.   [DISCONTINUED] acetaminophen (TYLENOL) 325 MG tablet Take 2 tablets (650 mg total) by mouth every 4 (four) hours as needed for headache or mild pain.   [DISCONTINUED] benzonatate (TESSALON) 200 MG capsule Take 1 capsule (200 mg total) by mouth 3 (three) times daily as needed for cough. Swallow whole   [DISCONTINUED] enoxaparin (LOVENOX) 40 MG/0.4ML injection Inject 0.4 mLs (40 mg total) into the skin daily.   [DISCONTINUED] flecainide (TAMBOCOR) 50 MG tablet Take 1 tablet (50 mg total) by mouth every 12 (twelve) hours.   [DISCONTINUED] nitroGLYCERIN (NITROSTAT) 0.4 MG SL tablet Place 1 tablet (0.4 mg total) under the tongue every 5 (five) minutes x 3 doses as needed for chest pain.   [DISCONTINUED] ondansetron (ZOFRAN) 4 MG/2ML SOLN injection Inject 2 mLs (4 mg total) into the vein every 6 (six) hours as  needed for nausea.   [DISCONTINUED] ranolazine (RANEXA) 500 MG 12 hr tablet Take 1 tablet (500 mg total) by mouth 2 (two) times daily.   [DISCONTINUED] sodium chloride 0.9 % infusion Inject 250 mLs into the vein as needed (for IV line care  (Saline / Heparin Lock)).   [DISCONTINUED] sodium chloride flush (NS) 0.9 % SOLN Inject 3 mLs into the vein every 12 (twelve) hours.   [DISCONTINUED] sodium chloride flush (NS) 0.9 % SOLN Inject 3 mLs into the vein as needed.   [DISCONTINUED] zolpidem (AMBIEN) 5 MG tablet Take 1 tablet (5 mg total) by mouth at bedtime as needed and may repeat dose one time if needed for sleep.    Allergies  Allergen Reactions   Bee Venom Anaphylaxis   Lidocaine Other (See Comments)    IV  formulation [name]      Review of Systems negative except from HPI and PMH  Physical Exam BP 108/70   Pulse (!) 58   Ht 5\' 9"  (1.753 m)   Wt 203 lb 6.4 oz (92.3 kg)   SpO2 96%   BMI 30.04 kg/m  Well developed and well nourished in no acute distress HENT normal Neck supple with JVP-flat Clear Device pocket well healed; without hematoma or erythema.  There is no tethering  Regular rate and rhythm, no  gallop No murmur Abd-soft with active BS No Clubbing cyanosis  edema Skin-warm and dry A & Oriented  Grossly normal sensory and motor function  ECG sinus at 58 Intervals 25/16/45 Left bundle branch block  Device function is normal. Programming changes none  See Paceart for details       See Paceart for details    CrCl cannot be calculated (Patient's most recent lab result is older than the maximum 21 days allowed.).   Assessment and  Plan ARVC-TMEM gene Positive   VT-recurrent status post ablation at [facility]   ICD   Medtronic    Sinus bradycardia  PVCs-frequent   High Risk Medication Surveillance-sotalol   PTSD     Fatigue  AFib false detections on device   Left bundle branch block-new   No interval ventricular tachycardia following  discharge from [facility]; he did have postablation sustained VT that terminated on its own.  Current medications we will continue with flecainide and nebivolol.  He is also on spironolactone.  Discussed the role of potential repeat ablation and do could also raise the possibility that failed ablation and persistent ventricular tachycardia might be an indication for transplant.  Obviously all very disruptive.  Left bundle branch block is new, do not have the operative report so do not know that it came from the ablation; I wonder whether is a consequence of the flecainide.  For right now we will not adjust the flecainide; we might want to decrease it from 100--75 so as to avoid the possible consequences of left bundle branch block on cardiac function.  Discussed colonoscopy; suggested as it is for screening and loose stool that it be deferred for right now.  Also asked about surgery for his wrist.  If there is no interval ventricular tachycardia between now and in December it would be reasonable for him to have that wrist surgery.  He is seeing a counselor here.  He also has an opportunity to talk to Dr. Camillia Herter done at [facility].  There is discussion about antidepressants and I have encouraged him to do this.  We also discussed the role of applying for disability; given that his job requires him to drive that is not currently an option and will not be till at least April 2025.  I spent today reviewing her records including previous charts and labs, taking her history and face-to-face counseling,  adjusting medications , and documentation

## 2023-06-22 ENCOUNTER — Encounter: Payer: Self-pay | Admitting: Gastroenterology

## 2023-06-26 ENCOUNTER — Emergency Department (HOSPITAL_COMMUNITY): Payer: BC Managed Care – PPO

## 2023-06-26 ENCOUNTER — Encounter (HOSPITAL_COMMUNITY): Payer: Self-pay

## 2023-06-26 ENCOUNTER — Telehealth: Payer: Self-pay | Admitting: Internal Medicine

## 2023-06-26 ENCOUNTER — Inpatient Hospital Stay (HOSPITAL_COMMUNITY)
Admission: EM | Admit: 2023-06-26 | Discharge: 2023-06-27 | DRG: 309 | Disposition: A | Discharge status: Other Acute Inpatient Hospital | Payer: BC Managed Care – PPO | Attending: Internal Medicine | Admitting: Internal Medicine

## 2023-06-26 DIAGNOSIS — K760 Fatty (change of) liver, not elsewhere classified: Secondary | ICD-10-CM | POA: Diagnosis present

## 2023-06-26 DIAGNOSIS — Z825 Family history of asthma and other chronic lower respiratory diseases: Secondary | ICD-10-CM

## 2023-06-26 DIAGNOSIS — Z8249 Family history of ischemic heart disease and other diseases of the circulatory system: Secondary | ICD-10-CM

## 2023-06-26 DIAGNOSIS — I501 Left ventricular failure: Secondary | ICD-10-CM | POA: Diagnosis present

## 2023-06-26 DIAGNOSIS — R002 Palpitations: Secondary | ICD-10-CM | POA: Diagnosis not present

## 2023-06-26 DIAGNOSIS — Z818 Family history of other mental and behavioral disorders: Secondary | ICD-10-CM

## 2023-06-26 DIAGNOSIS — I472 Ventricular tachycardia, unspecified: Principal | ICD-10-CM | POA: Diagnosis present

## 2023-06-26 DIAGNOSIS — Z823 Family history of stroke: Secondary | ICD-10-CM

## 2023-06-26 DIAGNOSIS — Z79899 Other long term (current) drug therapy: Secondary | ICD-10-CM

## 2023-06-26 DIAGNOSIS — Z7983 Long term (current) use of bisphosphonates: Secondary | ICD-10-CM

## 2023-06-26 DIAGNOSIS — F411 Generalized anxiety disorder: Secondary | ICD-10-CM | POA: Diagnosis present

## 2023-06-26 DIAGNOSIS — J45909 Unspecified asthma, uncomplicated: Secondary | ICD-10-CM | POA: Diagnosis present

## 2023-06-26 DIAGNOSIS — M810 Age-related osteoporosis without current pathological fracture: Secondary | ICD-10-CM | POA: Diagnosis present

## 2023-06-26 DIAGNOSIS — Z9049 Acquired absence of other specified parts of digestive tract: Secondary | ICD-10-CM

## 2023-06-26 DIAGNOSIS — Z884 Allergy status to anesthetic agent status: Secondary | ICD-10-CM

## 2023-06-26 DIAGNOSIS — I447 Left bundle-branch block, unspecified: Secondary | ICD-10-CM | POA: Diagnosis present

## 2023-06-26 DIAGNOSIS — I493 Ventricular premature depolarization: Secondary | ICD-10-CM | POA: Diagnosis present

## 2023-06-26 DIAGNOSIS — I428 Other cardiomyopathies: Secondary | ICD-10-CM | POA: Diagnosis present

## 2023-06-26 DIAGNOSIS — Z8042 Family history of malignant neoplasm of prostate: Secondary | ICD-10-CM

## 2023-06-26 DIAGNOSIS — Z87442 Personal history of urinary calculi: Secondary | ICD-10-CM

## 2023-06-26 DIAGNOSIS — Z800 Family history of malignant neoplasm of digestive organs: Secondary | ICD-10-CM

## 2023-06-26 DIAGNOSIS — Z803 Family history of malignant neoplasm of breast: Secondary | ICD-10-CM

## 2023-06-26 DIAGNOSIS — Z833 Family history of diabetes mellitus: Secondary | ICD-10-CM

## 2023-06-26 DIAGNOSIS — Z95810 Presence of automatic (implantable) cardiac defibrillator: Secondary | ICD-10-CM

## 2023-06-26 DIAGNOSIS — G4730 Sleep apnea, unspecified: Secondary | ICD-10-CM | POA: Diagnosis present

## 2023-06-26 DIAGNOSIS — Z87891 Personal history of nicotine dependence: Secondary | ICD-10-CM

## 2023-06-26 DIAGNOSIS — Z801 Family history of malignant neoplasm of trachea, bronchus and lung: Secondary | ICD-10-CM

## 2023-06-26 LAB — CBC WITH DIFFERENTIAL/PLATELET
Abs Immature Granulocytes: 0.02 10*3/uL (ref 0.00–0.07)
Basophils Absolute: 0.1 10*3/uL (ref 0.0–0.1)
Basophils Relative: 1 %
Eosinophils Absolute: 0.1 10*3/uL (ref 0.0–0.5)
Eosinophils Relative: 1 %
HCT: 42.5 % (ref 39.0–52.0)
Hemoglobin: 14.2 g/dL (ref 13.0–17.0)
Immature Granulocytes: 0 %
Lymphocytes Relative: 35 %
Lymphs Abs: 3.2 10*3/uL (ref 0.7–4.0)
MCH: 30.4 pg (ref 26.0–34.0)
MCHC: 33.4 g/dL (ref 30.0–36.0)
MCV: 91 fL (ref 80.0–100.0)
Monocytes Absolute: 0.8 10*3/uL (ref 0.1–1.0)
Monocytes Relative: 8 %
Neutro Abs: 5.1 10*3/uL (ref 1.7–7.7)
Neutrophils Relative %: 55 %
Platelets: 215 10*3/uL (ref 150–400)
RBC: 4.67 MIL/uL (ref 4.22–5.81)
RDW: 12.3 % (ref 11.5–15.5)
WBC: 9.2 10*3/uL (ref 4.0–10.5)
nRBC: 0 % (ref 0.0–0.2)

## 2023-06-26 MED ORDER — MAGNESIUM SULFATE 2 GM/50ML IV SOLN
2.0000 g | Freq: Once | INTRAVENOUS | Status: AC
  Administered 2023-06-26: 2 g via INTRAVENOUS
  Filled 2023-06-26: qty 50

## 2023-06-26 MED ORDER — ALPRAZOLAM 0.25 MG PO TABS
0.5000 mg | ORAL_TABLET | Freq: Once | ORAL | Status: AC
  Administered 2023-06-27: 0.5 mg via ORAL
  Filled 2023-06-26: qty 2

## 2023-06-26 NOTE — Telephone Encounter (Signed)
Patient contacted the after hours line regarding feelings of palpitations and fluttering in his chest.  Patient denies his ICD shocking him.  He uploaded his home ICD interrogation information for which I'm not able to evaluate at this time.  I informed patient he should come into the emergency department for further evaluation and at least get an ECG.

## 2023-06-26 NOTE — ED Triage Notes (Signed)
Pt is coming from home, had a ablation in October, states when he woke up this morning he did not feel well as dizziness, feeling a different type of chest pain that what he has had since the ablation. Pt has been diaphoretic as well. Has had x 3 ablations and has an implanted Defib. Pt is having PVCs that are increasing in frequency. Has Hx of VTACH. Cardiologist is Dr. Clide Cliff.    Medic vitals   118/83 62hr 14rr 98.2 98bgl  20g left ac

## 2023-06-26 NOTE — ED Provider Notes (Signed)
[place] EMERGENCY DEPARTMENT AT [facility] Provider Note   CSN: [id number] Arrival date & time: 06/26/23  2147     History {Add pertinent medical, surgical, social history, OB history to HPI:1} Chief Complaint  Patient presents with   Chest Pain    [name] is a 47 y.o. male.  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    He is status post catheter ablation [facility] ) and [facility]   Patient here with feeling "off" today feeling dizzy and lightheaded most of the day.  States he been having intermittent left-sided chest pain that comes and goes lasting for few seconds to minutes at a time.  Not have any pain currently.  He has had increasing palpitations and fluttering all day but does not believe he has had any AICD shocks.  He states the fluttering is worse since his last ablation in October.  His flecainide was recently reduced to 50 mg twice a day.  He continues to take sotalol as well as Bystolic. The palpitations are fluttering and he feels palpitations and feels like he is going in and out of V. tach and having PVCs.  He denies any syncopal episodes or any AICD shocks. No leg pain or leg swelling. Not having any chest pain or shortness of breath currently.  Continues to have nonsustained V. tach in the room.    Chest Pain Associated symptoms: shortness of breath   Associated symptoms: no abdominal pain, no dizziness, no fever, no headache, no nausea, no vomiting and no weakness        Home Medications Prior to Admission medications   Medication Sig Start Date End Date Taking? Authorizing Provider  alendronate (FOSAMAX) 70 MG tablet Take 70 mg by mouth every Sunday. 02/13/23   [provider]  ALPRAZolam Prudy Feeler) 0.25 MG tablet Take 1 tablet (0.25 mg total) by mouth 2 (two) times daily as needed for anxiety. 06/02/23   [name], [name], MD   Calcium Citrate-Vitamin D (CITRACAL + D PO) Take 1 tablet by mouth 2 (two) times daily.    [provider]  Cholecalciferol (VITAMIN D-3 PO) Take 1 capsule by mouth at bedtime.    [provider]  fexofenadine (ALLEGRA) 180 MG tablet Take 180 mg by mouth daily.    [provider]  flecainide (TAMBOCOR) 100 MG tablet Take 50 mg by mouth 2 (two) times daily. 05/29/23 05/28/24  [provider]  magnesium oxide (MAG-OX) 400 (240 Mg) MG tablet TAKE 1 TABLET BY MOUTH TWICE A DAY 07/22/22   [name], MD  montelukast (SINGULAIR) 10 MG tablet Take 1 tablet (10 mg total) by mouth at bedtime. 06/05/23   [name], [name], MD  Multiple Vitamin (MULITIVITAMIN WITH MINERALS) TABS Take 1 tablet by mouth daily.    [provider]  Multiple Vitamins-Minerals (ZINC PO) Take 1 tablet by mouth at bedtime.    [provider]  nebivolol (BYSTOLIC) 5 MG tablet Take 1 tablet (5 mg total) by mouth daily. Patient taking differently: Take 5 mg by mouth at bedtime. 11/05/22   [name], MD  potassium chloride (KLOR-CON M10) 10 MEQ tablet TAKE 4 TABLETS BY MOUTH TWICE A DAY 06/05/23   [name], MD  SOTALOL AF 120 MG TABS TAKE 1 & 1/2 TABLETS BY MOUTH TWICE DAILY 01/05/23   [name], MD  spironolactone (ALDACTONE) 25 MG tablet  Take 0.5 tablets by mouth daily. 05/29/23 05/28/24  [provider]  tadalafil (CIALIS) 5 MG tablet Take 5 mg by mouth daily as needed for erectile dysfunction.    [provider]      Allergies    Bee venom and Lidocaine    Review of Systems   Review of Systems  Constitutional:  Negative for activity change, appetite change and fever.  HENT:  Negative for congestion and rhinorrhea.   Respiratory:  Positive for chest tightness and shortness of breath.   Cardiovascular:  Positive for chest pain.  Gastrointestinal:  Negative for abdominal pain, nausea and vomiting.  Genitourinary:  Negative for dysuria and  hematuria.  Musculoskeletal:  Negative for arthralgias and myalgias.  Skin:  Negative for rash.  Neurological:  Negative for dizziness, weakness and headaches.   all other systems are negative except as noted in the HPI and PMH.    Physical Exam Updated Vital Signs BP (!) 140/85 (BP Location: Right Arm)   Pulse (!) 58   Temp 98 F (36.7 C) (Oral)   Resp 18   SpO2 98%  Physical Exam Vitals and nursing note reviewed.  Constitutional:      General: He is not in acute distress.    Appearance: He is well-developed.     Comments: Anxious  HENT:     Head: Normocephalic and atraumatic.     Mouth/Throat:     Pharynx: No oropharyngeal exudate.  Eyes:     Conjunctiva/sclera: Conjunctivae normal.     Pupils: Pupils are equal, round, and reactive to light.  Neck:     Comments: No meningismus. Cardiovascular:     Rate and Rhythm: Normal rate and regular rhythm.     Heart sounds: Normal heart sounds. No murmur heard. Pulmonary:     Effort: Pulmonary effort is normal. No respiratory distress.     Breath sounds: Normal breath sounds.     Comments: Several runs of nonsustained VT throughout interview about 10-15 beats at a time Abdominal:     Palpations: Abdomen is soft.     Tenderness: There is no abdominal tenderness. There is no guarding or rebound.  Musculoskeletal:        General: No tenderness. Normal range of motion.     Cervical back: Normal range of motion and neck supple.  Skin:    General: Skin is warm.  Neurological:     Mental Status: He is alert and oriented to person, place, and time.     Cranial Nerves: No cranial nerve deficit.     Motor: No abnormal muscle tone.     Coordination: Coordination normal.     Comments:  5/5 strength throughout. CN 2-12 intact.Equal grip strength.   Psychiatric:        Behavior: Behavior normal.     ED Results / Procedures / Treatments   Labs (all labs ordered are listed, but only abnormal results are displayed) Labs Reviewed   CBC WITH DIFFERENTIAL/PLATELET  BASIC METABOLIC PANEL  URINALYSIS, ROUTINE W REFLEX MICROSCOPIC  MAGNESIUM  TROPONIN I (HIGH SENSITIVITY)    EKG None  Radiology No results found.  Procedures Procedures  {Document cardiac monitor, telemetry assessment procedure when appropriate:1}  Medications Ordered in ED Medications  magnesium sulfate IVPB 2 g 50 mL (2 g Intravenous New Bag/Given 06/26/23 2316)    ED Course/ Medical Decision Making/ A&P   {   Click here for ABCD2, HEART and other calculatorsREFRESH Note before signing :1}  Medical Decision Making Amount and/or Complexity of Data Reviewed Independent Historian: spouse Labs: ordered. Decision-making details documented in ED Course. Radiology: ordered and independent interpretation performed. Decision-making details documented in ED Course. ECG/medicine tests: ordered and independent interpretation performed. Decision-making details documented in ED Course.  Risk Prescription drug management.   Patient with recurrent VT status post multiple ablations here with palpitations, shortness of breath and intermittent chest tightness.  EKG in triage shows sinus rhythm with T wave inversions anteriorly.   Interrogation of AICD reports obtained.  He has had 3 sustained V. tach episodes this evening with the longest 1 minute 46 seconds at 8:49 PM.  He also had 7 runs of nonsustained V. tach but no shocks as he is below the shock threshold.  Impedance increased to 101 ohms.   ED ECG REPORT   Date: 06/26/2023  Rate: 60  Rhythm: normal sinus rhythm  QRS Axis: normal  Intervals: normal  ST/T Wave abnormalities: nonspecific T wave changes  Conduction Disutrbances:none  Narrative Interpretation:   Old EKG Reviewed: changes noted  I have personally reviewed the EKG tracing and agree with the computerized printout as noted.  {Document critical care time when appropriate:1} {Document review of labs  and clinical decision tools ie heart score, Chads2Vasc2 etc:1}  {Document your independent review of radiology images, and any outside records:1} {Document your discussion with family members, caretakers, and with consultants:1} {Document social determinants of health affecting pt's care:1} {Document your decision making why or why not admission, treatments were needed:1} Final Clinical Impression(s) / ED Diagnoses Final diagnoses:  None    Rx / DC Orders ED Discharge Orders     None

## 2023-06-27 ENCOUNTER — Other Ambulatory Visit: Payer: Self-pay

## 2023-06-27 DIAGNOSIS — Z818 Family history of other mental and behavioral disorders: Secondary | ICD-10-CM | POA: Diagnosis not present

## 2023-06-27 DIAGNOSIS — Z823 Family history of stroke: Secondary | ICD-10-CM | POA: Diagnosis not present

## 2023-06-27 DIAGNOSIS — I447 Left bundle-branch block, unspecified: Secondary | ICD-10-CM | POA: Diagnosis present

## 2023-06-27 DIAGNOSIS — Z87442 Personal history of urinary calculi: Secondary | ICD-10-CM | POA: Diagnosis not present

## 2023-06-27 DIAGNOSIS — Z803 Family history of malignant neoplasm of breast: Secondary | ICD-10-CM | POA: Diagnosis not present

## 2023-06-27 DIAGNOSIS — Z87891 Personal history of nicotine dependence: Secondary | ICD-10-CM | POA: Diagnosis not present

## 2023-06-27 DIAGNOSIS — Z7983 Long term (current) use of bisphosphonates: Secondary | ICD-10-CM | POA: Diagnosis not present

## 2023-06-27 DIAGNOSIS — I472 Ventricular tachycardia, unspecified: Secondary | ICD-10-CM

## 2023-06-27 DIAGNOSIS — F411 Generalized anxiety disorder: Secondary | ICD-10-CM | POA: Diagnosis present

## 2023-06-27 DIAGNOSIS — R002 Palpitations: Secondary | ICD-10-CM | POA: Diagnosis present

## 2023-06-27 DIAGNOSIS — Z9049 Acquired absence of other specified parts of digestive tract: Secondary | ICD-10-CM | POA: Diagnosis not present

## 2023-06-27 DIAGNOSIS — Z8249 Family history of ischemic heart disease and other diseases of the circulatory system: Secondary | ICD-10-CM | POA: Diagnosis not present

## 2023-06-27 DIAGNOSIS — Z8042 Family history of malignant neoplasm of prostate: Secondary | ICD-10-CM | POA: Diagnosis not present

## 2023-06-27 DIAGNOSIS — I493 Ventricular premature depolarization: Secondary | ICD-10-CM | POA: Diagnosis present

## 2023-06-27 DIAGNOSIS — I501 Left ventricular failure: Secondary | ICD-10-CM | POA: Diagnosis present

## 2023-06-27 DIAGNOSIS — Z95810 Presence of automatic (implantable) cardiac defibrillator: Secondary | ICD-10-CM | POA: Diagnosis not present

## 2023-06-27 DIAGNOSIS — Z79899 Other long term (current) drug therapy: Secondary | ICD-10-CM | POA: Diagnosis not present

## 2023-06-27 DIAGNOSIS — J45909 Unspecified asthma, uncomplicated: Secondary | ICD-10-CM | POA: Diagnosis present

## 2023-06-27 DIAGNOSIS — Z800 Family history of malignant neoplasm of digestive organs: Secondary | ICD-10-CM | POA: Diagnosis not present

## 2023-06-27 DIAGNOSIS — Z884 Allergy status to anesthetic agent status: Secondary | ICD-10-CM | POA: Diagnosis not present

## 2023-06-27 DIAGNOSIS — Z825 Family history of asthma and other chronic lower respiratory diseases: Secondary | ICD-10-CM | POA: Diagnosis not present

## 2023-06-27 DIAGNOSIS — G4730 Sleep apnea, unspecified: Secondary | ICD-10-CM | POA: Diagnosis present

## 2023-06-27 DIAGNOSIS — K760 Fatty (change of) liver, not elsewhere classified: Secondary | ICD-10-CM | POA: Diagnosis present

## 2023-06-27 DIAGNOSIS — M810 Age-related osteoporosis without current pathological fracture: Secondary | ICD-10-CM | POA: Diagnosis present

## 2023-06-27 DIAGNOSIS — I428 Other cardiomyopathies: Secondary | ICD-10-CM | POA: Diagnosis present

## 2023-06-27 LAB — URINALYSIS, ROUTINE W REFLEX MICROSCOPIC
Bilirubin Urine: NEGATIVE
Glucose, UA: NEGATIVE mg/dL
Hgb urine dipstick: NEGATIVE
Ketones, ur: NEGATIVE mg/dL
Leukocytes,Ua: NEGATIVE
Nitrite: NEGATIVE
Protein, ur: NEGATIVE mg/dL
Specific Gravity, Urine: 1.006 (ref 1.005–1.030)
pH: 7 (ref 5.0–8.0)

## 2023-06-27 LAB — BASIC METABOLIC PANEL
Anion gap: 7 (ref 5–15)
BUN: 8 mg/dL (ref 6–20)
CO2: 27 mmol/L (ref 22–32)
Calcium: 9.4 mg/dL (ref 8.9–10.3)
Chloride: 105 mmol/L (ref 98–111)
Creatinine, Ser: 0.96 mg/dL (ref 0.61–1.24)
GFR, Estimated: >60 mL/min (ref >60)
Glucose, Bld: 103 mg/dL — ABNORMAL HIGH (ref 70–99)
Potassium: 4.5 mmol/L (ref 3.5–5.1)
Sodium: 139 mmol/L (ref 135–145)

## 2023-06-27 LAB — TROPONIN I (HIGH SENSITIVITY)
Troponin I (High Sensitivity): 13 ng/L (ref <18)
Troponin I (High Sensitivity): 16 ng/L (ref <18)

## 2023-06-27 LAB — MAGNESIUM: Magnesium: 2 mg/dL (ref 1.7–2.4)

## 2023-06-27 MED ORDER — SOTALOL HCL 120 MG PO TABS
180.0000 mg | ORAL_TABLET | Freq: Two times a day (BID) | ORAL | Status: DC
  Administered 2023-06-27: 180 mg via ORAL
  Filled 2023-06-27 (×2): qty 1.5

## 2023-06-27 MED ORDER — FLECAINIDE ACETATE 50 MG PO TABS: 50.0000 mg | ORAL_TABLET | Freq: Two times a day (BID) | ORAL | Status: DC

## 2023-06-27 MED ORDER — LORAZEPAM 2 MG/ML PO CONC: 1.0000 mg | Freq: Once | ORAL | Status: DC

## 2023-06-27 MED ORDER — SERTRALINE HCL 50 MG PO TABS
25.0000 mg | ORAL_TABLET | Freq: Every day | ORAL | Status: DC
  Administered 2023-06-27: 25 mg via ORAL
  Filled 2023-06-27: qty 1

## 2023-06-27 MED ORDER — FLECAINIDE ACETATE 50 MG PO TABS
50.0000 mg | ORAL_TABLET | Freq: Once | ORAL | Status: AC
  Administered 2023-06-27: 50 mg via ORAL
  Filled 2023-06-27: qty 1

## 2023-06-27 MED ORDER — POTASSIUM CHLORIDE CRYS ER 20 MEQ PO TBCR
40.0000 meq | EXTENDED_RELEASE_TABLET | Freq: Two times a day (BID) | ORAL | Status: DC
  Administered 2023-06-27: 40 meq via ORAL
  Filled 2023-06-27: qty 2

## 2023-06-27 MED ORDER — SPIRONOLACTONE 12.5 MG HALF TABLET
12.5000 mg | ORAL_TABLET | Freq: Every day | ORAL | Status: DC
  Administered 2023-06-27: 12.5 mg via ORAL
  Filled 2023-06-27: qty 1

## 2023-06-27 MED ORDER — MONTELUKAST SODIUM 10 MG PO TABS
10.0000 mg | ORAL_TABLET | Freq: Every day | ORAL | Status: DC
  Administered 2023-06-27: 10 mg via ORAL
  Filled 2023-06-27: qty 1

## 2023-06-27 MED ORDER — FLECAINIDE ACETATE 50 MG PO TABS
50.0000 mg | ORAL_TABLET | Freq: Two times a day (BID) | ORAL | Status: DC
  Administered 2023-06-27: 50 mg via ORAL
  Filled 2023-06-27: qty 1

## 2023-06-27 MED ORDER — LORATADINE 10 MG PO TABS
10.0000 mg | ORAL_TABLET | Freq: Every day | ORAL | Status: DC
  Administered 2023-06-27: 10 mg via ORAL
  Filled 2023-06-27: qty 1

## 2023-06-27 MED ORDER — METOPROLOL TARTRATE 5 MG/5ML IV SOLN
5.0000 mg | Freq: Once | INTRAVENOUS | Status: AC
  Administered 2023-06-27: 5 mg via INTRAVENOUS
  Filled 2023-06-27: qty 5

## 2023-06-27 MED ORDER — SOTALOL HCL 120 MG PO TABS
180.0000 mg | ORAL_TABLET | Freq: Two times a day (BID) | ORAL | Status: DC
  Administered 2023-06-27: 180 mg via ORAL
  Filled 2023-06-27: qty 1.5

## 2023-06-27 MED ORDER — ALPRAZOLAM 0.25 MG PO TABS
0.2500 mg | ORAL_TABLET | Freq: Two times a day (BID) | ORAL | Status: DC | PRN
  Administered 2023-06-27: 0.25 mg via ORAL
  Filled 2023-06-27: qty 1

## 2023-06-27 MED ORDER — FLECAINIDE ACETATE 50 MG PO TABS
100.0000 mg | ORAL_TABLET | Freq: Two times a day (BID) | ORAL | Status: DC
  Administered 2023-06-27: 100 mg via ORAL
  Filled 2023-06-27: qty 2

## 2023-06-27 MED ORDER — ENOXAPARIN SODIUM 40 MG/0.4ML IJ SOSY
40.0000 mg | PREFILLED_SYRINGE | INTRAMUSCULAR | Status: DC
  Administered 2023-06-27: 40 mg via SUBCUTANEOUS
  Filled 2023-06-27: qty 0.4

## 2023-06-27 MED ORDER — MAGNESIUM OXIDE -MG SUPPLEMENT 400 (240 MG) MG PO TABS: 400.0000 mg | ORAL_TABLET | Freq: Two times a day (BID) | ORAL | Status: DC

## 2023-06-27 MED ORDER — LORAZEPAM 2 MG/ML IJ SOLN
1.0000 mg | Freq: Once | INTRAMUSCULAR | Status: AC
  Administered 2023-06-27: 1 mg via INTRAVENOUS
  Filled 2023-06-27: qty 1

## 2023-06-27 MED ORDER — MAGNESIUM OXIDE -MG SUPPLEMENT 400 (240 MG) MG PO TABS
400.0000 mg | ORAL_TABLET | Freq: Two times a day (BID) | ORAL | Status: DC
  Administered 2023-06-27 (×2): 400 mg via ORAL
  Filled 2023-06-27 (×2): qty 1

## 2023-06-27 MED ORDER — NEBIVOLOL HCL 5 MG PO TABS
5.0000 mg | ORAL_TABLET | Freq: Every day | ORAL | Status: DC
  Administered 2023-06-27: 5 mg via ORAL
  Filled 2023-06-27: qty 1

## 2023-06-27 MED ORDER — ALPRAZOLAM 0.25 MG PO TABS
0.2500 mg | ORAL_TABLET | Freq: Four times a day (QID) | ORAL | Status: DC | PRN
  Administered 2023-06-27: 0.25 mg via ORAL
  Filled 2023-06-27: qty 1

## 2023-06-27 MED ORDER — POTASSIUM CHLORIDE CRYS ER 20 MEQ PO TBCR
20.0000 meq | EXTENDED_RELEASE_TABLET | Freq: Every day | ORAL | Status: DC
  Administered 2023-06-27: 20 meq via ORAL
  Filled 2023-06-27: qty 1

## 2023-06-27 MED ORDER — VITAMIN D 25 MCG (1000 UNIT) PO TABS
5000.0000 [IU] | ORAL_TABLET | Freq: Every day | ORAL | Status: DC
  Administered 2023-06-27: 5000 [IU] via ORAL
  Filled 2023-06-27: qty 5

## 2023-06-27 NOTE — Discharge Summary (Cosign Needed Addendum)
Discharge Summary    Patient ID: [name] MRN: [id number]; DOB: October 08, 1975  Admit date: 06/26/2023 Discharge date: 06/27/2023  PCP:  Judy Pimple, MD   [facility] Providers Cardiologist:  Sherryl Manges, MD  Electrophysiologist:  Sherryl Manges, MD       Discharge Diagnoses    Principal Problem:   Ventricular tachycardia [facility])    Diagnostic Studies/Procedures    CXR, results below _____________   History of Present Illness     [name] is a 47 y.o. male with a significant past medical history of arrhythmogenic cardiomyopathy status post Medtronic ICD with recent generator change in 2021, status post ventricular tachycardia catheter ablation and recent endocardial/epicardial ablation, sleep apnea, asthma, fatty liver disease, generalized anxiety disorder, and osteoporosis who was being seen 06/27/2023 for the evaluation of palpitations and noted sustained VT on ICD interrogation.  Hospital Course     Consultants: None   Ventricular tachycardia: Patient with recurrent sustained and non-sustained ventricular tachycardia events without ICD defibrillation due to being under tachy-therapy zone.  Secondary to arrhythmogenic cardiomyopathy.  Telemetry with recurrent non-sustained ventricular tachycardia.  Electrolytes are normal.  Patient currently takes flecainide 50 mg twice daily and sotalol 180 mg twice daily.  Also taking nebivolol.   -The flecainide dose had recently been decreased because of a left bundle branch block of 140 ms. - Flecainide was increased back to 100 mg bid, to help decrease the frequency of the VT, but his QRS had gone from 140 ms down to 124 ms of the flecainide for 2 days.  ARVD s/p multiple ablations - Incessant nonsustained MMVT: Occurring every few minutes but lasting <30s while in ED, only 2 lead telemetry available, but LBB morphology in VI with an inferior axis, CL ~429ms. - Increase flecainide back to 100mg . Was reportedly  well controlled with this. Will monitor QRS duration. This is not a long term solution. He has intolerance to lidocaine reportedly and would like to avoid amiodarone given likely need for repeat ablation - with epicardial approach.  - Continue sotalol.  - Will give IV metoprolol and IV lorazepam as we wait on additional flecainide to take effect.   - Pursue transfer to [facility] for epicardial ablation as well as potential transplant evaluation.  - Given that his VT has been non-sustained and hemodynamically tolerated, will not lower treatment threshold on his device while admitted in order to prevent shocks.     Arrhythmogenic cardiomyopathy: Known history of arrhythmogenic cardiomyopathy complicated with ventricular tachycardia and mild left ventricular failure (EF 47%)).  Patient is now status post ICD placement.  Currently taking spironolactone.  On sotalol and flecainide for tachyarrhythmia management. --Further management outpatient.     ICD in-situ: Secondary prevention for arrhythmogenic cardiomyopathy.  Single lead Medtronic ICD.  Interrogation demonstrated multiple episodes of VT.   Generalized anxiety: Likely secondary to medical condition.   --Continue home alprazolam.    After careful review of all the data, Dr. Jimmey Ralph felt that the best option was to transfer him to [facility] to the EP service for possible epicardial ablation.  Arrangements were made, he was accepted by Dr. Alden Hipp.  He will be transported by [facility] and has a bed in room 7715.  He will be transported later this evening.    _____________  Discharge Vitals Blood pressure 97/63, pulse (!) 55, temperature 98.7 F (37.1 C), temperature source Oral, resp. rate 13, SpO2 97%.  There were no vitals filed for this visit.  Labs &  Radiologic Studies    CBC Recent Labs    06/26/23 2206  WBC 9.2  NEUTROABS 5.1  HGB 14.2  HCT 42.5  MCV 91.0  PLT 215   Basic Metabolic Panel Recent Labs    21/30/86 2206  NA 139  K 4.5   CL 105  CO2 27  GLUCOSE 103*  BUN 8  CREATININE 0.96  CALCIUM 9.4  MG 2.0   Liver Function Tests No results for input(s): "AST", "ALT", "ALKPHOS", "BILITOT", "PROT", "ALBUMIN" in the last 72 hours. No results for input(s): "LIPASE", "AMYLASE" in the last 72 hours. High Sensitivity Troponin:   Recent Labs  Lab 06/26/23 2206 06/27/23 0106  TROPONINIHS 13 16    BNP Invalid input(s): "POCBNP" D-Dimer No results for input(s): "DDIMER" in the last 72 hours. Hemoglobin A1C No results for input(s): "HGBA1C" in the last 72 hours. Fasting Lipid Panel No results for input(s): "CHOL", "HDL", "LDLCALC", "TRIG", "CHOLHDL", "LDLDIRECT" in the last 72 hours. Thyroid Function Tests No results for input(s): "TSH", "T4TOTAL", "T3FREE", "THYROIDAB" in the last 72 hours.  Invalid input(s): "FREET3" _____________  DG Chest Port 1 View  Result Date: 06/27/2023 CLINICAL DATA:  Chest pain EXAM: PORTABLE CHEST 1 VIEW COMPARISON:  02/28/2023 FINDINGS: Stable cardiomegaly. Right chest wall ICD. No focal consolidation, pleural effusion, or pneumothorax. No displaced rib fractures. IMPRESSION: No active disease. Electronically Signed   By: Minerva Fester M.D.   On: 06/27/2023 03:04   CUP PACEART INCLINIC DEVICE CHECK  Result Date: 06/19/2023 Full device interrogation not done. No episodes since 05/28/2023.  VT ablation 05/27/2023.Ancil Boozer, BSN, RN  Disposition   Pt is being discharged home today in good condition.  Follow-up Plans & Appointments     Discharge Instructions     Diet - low sodium heart healthy   Complete by: As directed    Increase activity slowly   Complete by: As directed         Discharge Medications   Allergies as of 06/27/2023       Reactions   Bee Venom Anaphylaxis   Lidocaine Other (See Comments)   IV formulation [name]  Was given while already being seen for Vtach, so possibly given while in a "storm".        Medication List     TAKE these  medications    alendronate 70 MG tablet Commonly known as: FOSAMAX Take 70 mg by mouth every Sunday.   ALPRAZolam 0.25 MG tablet Commonly known as: XANAX Take 1 tablet (0.25 mg total) by mouth 2 (two) times daily as needed for anxiety.   CITRACAL + D PO Take 1 tablet by mouth 2 (two) times daily.   fexofenadine 180 MG tablet Commonly known as: ALLEGRA Take 180 mg by mouth daily.   flecainide 100 MG tablet Commonly known as: TAMBOCOR Take 50 mg by mouth 2 (two) times daily.   Klor-Con M10 10 MEQ tablet Generic drug: potassium chloride TAKE 4 TABLETS BY MOUTH TWICE A DAY   magnesium oxide 400 (240 Mg) MG tablet Commonly known as: MAG-OX TAKE 1 TABLET BY MOUTH TWICE A DAY   montelukast 10 MG tablet Commonly known as: SINGULAIR Take 1 tablet (10 mg total) by mouth at bedtime.   multivitamin with minerals Tabs tablet Take 1 tablet by mouth daily.   nebivolol 5 MG tablet Commonly known as: BYSTOLIC Take 1 tablet (5 mg total) by mouth daily. What changed: when to take this   sertraline 25 MG tablet Commonly known  as: ZOLOFT Take 25 mg by mouth daily.   SOTALOL AF 120 MG Tabs TAKE 1 & 1/2 TABLETS BY MOUTH TWICE DAILY   spironolactone 25 MG tablet Commonly known as: ALDACTONE Take 0.5 tablets by mouth daily.   tadalafil 5 MG tablet Commonly known as: CIALIS Take 5 mg by mouth daily as needed for erectile dysfunction.   VITAMIN D-3 PO Take 1 capsule by mouth at bedtime.   Zinc 30 MG Tabs Take 30 mg by mouth at bedtime.           Outstanding Labs/Studies   none  Duration of Discharge Encounter   Greater than 30 minutes including physician time.  Signed, Theodore Demark, PA-C 06/27/2023, 6:07 PM

## 2023-06-27 NOTE — ED Notes (Signed)
Pt had another run of VT lasting approx 17 seconds with heart rate up to 140.  Pt denies pain, feels fluttery feeling, pt is very anxious and shaking and asking for something for anxiety.  MD notified for orders.

## 2023-06-27 NOTE — Progress Notes (Signed)
Spoke with Theodore Demark, PA regarding pt request to eat.  Verbal order with readback for heart health diet.  Also, per provider, pt could possibly get a bed later at [facility].  Waiting on discharges to hear back.   Primary RN, Shawn updated as well as patient and wife per provider request.

## 2023-06-27 NOTE — ED Notes (Signed)
Pt with run of VT and captured on monitor, strip printed, admitting MD notified.  VT up to rate of 150 lasting approx 10 seconds.

## 2023-06-27 NOTE — Progress Notes (Addendum)
Spoke with the transfer coordinator at [facility].  She will speak with her supervisor, then the team, and then give [place] a call back.  She did not see any problems with the transfer, but could not tell me anything about bed availability.  Theodore Demark, PA-C 06/27/2023 10:29 AM  [facility] called back, Dr. Alden Hipp has agreed to be the accepting physician. The transfer coordinator felt that they should get a bed today, waiting on discharges.  Spoke with the nurse who will update the family.  Theodore Demark, PA-C 06/27/2023 1:09 PM

## 2023-06-27 NOTE — ED Notes (Signed)
[facility] bed placement is room 7715

## 2023-06-27 NOTE — Progress Notes (Addendum)
Rounding Note    Patient Name: [name] Date of Encounter: 06/27/2023  [facility] Cardiologist: Sherryl Manges, MD   Subjective   Called to see pt due to mult runs VT.  They make him very uncomfortable and he feels anxious about this as well.  Had VT ablation in October and Flecainide dose decreased from 100 mg >> 50 mg bid 2 days ago due to the LBBB which is new since the ablation.   Inpatient Medications    Scheduled Meds:  flecainide  50 mg Oral BID   loratadine  10 mg Oral Daily   magnesium oxide  400 mg Oral BID   montelukast  10 mg Oral QHS   nebivolol  5 mg Oral Daily   potassium chloride  20 mEq Oral Daily   sertraline  25 mg Oral Daily   sotalol  180 mg Oral BID   spironolactone  12.5 mg Oral Daily   Vitamin D-3   Oral QHS   Continuous Infusions:  PRN Meds: ALPRAZolam   Vital Signs    Vitals:   06/27/23 0700 06/27/23 0730 06/27/23 0800 06/27/23 0830  BP: 128/77 132/79 (!) 114/90 110/76  Pulse: (!) 58 63 63 62  Resp: 12 16 12 13   Temp:      TempSrc:      SpO2: 100% 100% 98% 97%   No intake or output data in the 24 hours ending 06/27/23 0912    06/19/2023   10:09 AM 05/05/2023    3:17 PM 04/15/2023   12:25 PM  Last 3 Weights  Weight (lbs) 203 lb 6.4 oz 212 lb 2 oz 213 lb 8 oz  Weight (kg) 92.262 kg 96.219 kg 96.843 kg      Telemetry    SR w/ frequent runs of VT lasting about 10 sec - Personally Reviewed  ECG    SR, PR 241, QRS 124 - Personally Reviewed  Physical Exam   GEN: No acute distress.   Neck: No JVD Cardiac: RRR, no murmurs, rubs, or gallops.  Respiratory: Clear to auscultation bilaterally. GI: Soft, nontender, non-distended  MS: No edema; No deformity. Neuro:  Nonfocal  Psych: Normal affect   Labs    High Sensitivity Troponin:   Recent Labs  Lab 06/26/23 2206 06/27/23 0106  TROPONINIHS 13 16     Chemistry Recent Labs  Lab 06/26/23 2206  NA 139  K 4.5  CL 105  CO2 27  GLUCOSE 103*  BUN 8   CREATININE 0.96  CALCIUM 9.4  MG 2.0  GFRNONAA >60  ANIONGAP 7    Lipids No results for input(s): "CHOL", "TRIG", "HDL", "LABVLDL", "LDLCALC", "CHOLHDL" in the last 168 hours.  Hematology Recent Labs  Lab 06/26/23 2206  WBC 9.2  RBC 4.67  HGB 14.2  HCT 42.5  MCV 91.0  MCH 30.4  MCHC 33.4  RDW 12.3  PLT 215   Thyroid No results for input(s): "TSH", "FREET4" in the last 168 hours.  BNPNo results for input(s): "BNP", "PROBNP" in the last 168 hours.  DDimer No results for input(s): "DDIMER" in the last 168 hours.   Radiology    DG Chest Port 1 View  Result Date: 06/27/2023 CLINICAL DATA:  Chest pain EXAM: PORTABLE CHEST 1 VIEW COMPARISON:  02/28/2023 FINDINGS: Stable cardiomegaly. Right chest wall ICD. No focal consolidation, pleural effusion, or pneumothorax. No displaced rib fractures. IMPRESSION: No active disease. Electronically Signed   By: Minerva Fester M.D.   On: 06/27/2023 03:04  Cardiac Studies   VT ablation 05/21/2023  Patient Profile     47 y.o. male w/ hx cMRI evidence ARVC s/p mult ablations was admitted 11/08 w/ mult runs VT  Assessment & Plan    Recurrent VT - add lidocaine did not tolerate earlier but not much else we can do now.  -cont flecainide at current dose - [facility] from [facility] - after flecainide decreased, QRS went from 160 >> 124 ms - consider tx [facility]   For questions or updates, please contact [facility] Please consult [email or web address] for contact info under        Signed, Theodore Demark, PA-C  06/27/2023, 9:12 AM

## 2023-06-27 NOTE — ED Notes (Signed)
Transportation has been arranged for the patient. [facility] will be arriving tonight to pick up the patient. Due to understaffing, it will be tonight before the patient can be picked up.

## 2023-06-27 NOTE — ED Notes (Addendum)
Pt with another run of VT lasting approx 12 seconds with rate up to 141bpm.  Both times pt states felt fluttering feeling but denies pain.  MD notified.

## 2023-06-27 NOTE — H&P (Addendum)
Cardiology Admission History and Physical   Patient ID: [name] MRN: [id number]; DOB: 09-30-1975   Admission date: 06/26/2023  PCP:  Judy Pimple, MD   [facility] Providers Cardiologist:  Sherryl Manges, MD  Electrophysiologist:  Sherryl Manges, MD       Chief Complaint:  Palpitations  Patient Profile:   [name] is a 46 y.o. male with a significant past medical history of arrhythmogenic cardiomyopathy status post Medtronic ICD with recent generator change in 2021, status post ventricular tachycardia catheter ablation and recent endocardial/epicardial ablation, sleep apnea, asthma, fatty liver disease, generalized anxiety disorder, and osteoporosis who is being seen 06/27/2023 for the evaluation of palpitations and noted sustained VT on ICD interrogation.Marland Kitchen  History of Present Illness:   [name] reports upon awakening early morning yesterday he didn't feel well.  He noticed periodic fluttering in his chest sometimes associated with dizziness.  Fluttering feeling was sporadic, usually lasting a few seconds, and associated with chest tightness.  Patient denies fainting or feeling like he was going to faint.  He recently had his flecainide decreased two days ago by Dr. Graciela Husbands (100 mg twice daily to 50 mg twice daily) due to new LBBB noted on ECG.  He denies missing any of his medications.  He also states that he started Zoloft yesterday as well.  In the emergency department, his ICD was interrogated.   ICD interrogation revealed two non-sustained ventricular tachycardia events with longest lasting 1 minute and 36 seconds today at 20:49..  Average heart rate was 157 bpm, below his tachy-therapy threshold.     Of note, patient was hospitalized in October 2024 after presenting with palpitations and pre-syncope.  Device interrogation at that time showed frequent sustained ventricular tachycardia events with one ICD defibrillation reported.  He was placed on flecainide  and Ranexa which did decrease his premature ventricular complexes burden.  Patient was transferred to [facility] for ventricular tachycardia ablation.  At the time of his discharge from [facility], he was reinitiated on sotalol 180 mg and flecainide 100 mg (in addition to spironolactone).   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   Asthma when younger   Fatty liver    History of chicken pox    History of kidney stones    MDT DVFB1D1 Visia AF MRI VR  ICD (implantable cardiac defibrillator) in place    Osteopenia    Pancreatitis    Sleep apnea    uses cpap   Ventricular tachycardia (HCC)    sotalol therapy;  catheter ablation at [facility] 10/10 and [facility] 2013    Past Surgical History:  Procedure Laterality Date   CARDIAC CATHETERIZATION  06/01/2006   CARDIAC DEFIBRILLATOR PLACEMENT  06/03/2006   Medtronic   CHOLECYSTECTOMY N/A 08/01/2021   Procedure: LAPAROSCOPIC CHOLECYSTECTOMY WITH INTRAOPERATIVE CHOLANGIOGRAM;  Surgeon: Harriette Bouillon, MD;  Location: [facility] ORS;  Service: General;  Laterality: N/A;   HAND SURGERY Left    ICD GENERATOR CHANGEOUT N/A 11/04/2019   Procedure: ICD GENERATOR CHANGEOUT;  Surgeon: [name], MD;  Location: [facility] INVASIVE CV LAB;  Service: Cardiovascular;  Laterality: N/A;   IMPLANTABLE CARDIOVERTER DEFIBRILLATOR GENERATOR CHANGE N/A 12/11/2011   Procedure: IMPLANTABLE CARDIOVERTER DEFIBRILLATOR GENERATOR CHANGE;  Surgeon: [name], MD;  Location: [facility] CATH  LAB;  Service: Cardiovascular;  Laterality: N/A;   KYPHOPLASTY N/A 05/14/2021   Procedure: Lumbar Five Vertebroplasty;  Surgeon: Jadene Pierini, MD;  Location: [facility] OR;  Service: Neurosurgery;  Laterality: N/A;   LUMBAR LAMINECTOMY/ DECOMPRESSION WITH MET-RX N/A 05/14/2021   Procedure: Lumbar  Four-Five Minimally Invasive Laminectomy;  Surgeon: Jadene Pierini, MD;  Location: [facility] OR;  Service: Neurosurgery;  Laterality: N/A;  3C/RM 19   TONSILLECTOMY     vt ablation  10/10, 10/13   x 2   WISDOM TOOTH EXTRACTION       Medications Prior to Admission: Prior to Admission medications   Medication Sig Start Date End Date Taking? Authorizing Provider  alendronate (FOSAMAX) 70 MG tablet Take 70 mg by mouth every Sunday. 02/13/23   [provider]  ALPRAZolam Prudy Feeler) 0.25 MG tablet Take 1 tablet (0.25 mg total) by mouth 2 (two) times daily as needed for anxiety. 06/02/23   [name], [name], MD  Calcium Citrate-Vitamin D (CITRACAL + D PO) Take 1 tablet by mouth 2 (two) times daily.    [provider]  Cholecalciferol (VITAMIN D-3 PO) Take 1 capsule by mouth at bedtime.    [provider]  fexofenadine (ALLEGRA) 180 MG tablet Take 180 mg by mouth daily.    [provider]  flecainide (TAMBOCOR) 100 MG tablet Take 50 mg by mouth 2 (two) times daily. 05/29/23 05/28/24  [provider]  magnesium oxide (MAG-OX) 400 (240 Mg) MG tablet TAKE 1 TABLET BY MOUTH TWICE A DAY 07/22/22   [name], MD  montelukast (SINGULAIR) 10 MG tablet Take 1 tablet (10 mg total) by mouth at bedtime. 06/05/23   [name], [name], MD  Multiple Vitamin (MULITIVITAMIN WITH MINERALS) TABS Take 1 tablet by mouth daily.    [provider]  Multiple Vitamins-Minerals (ZINC PO) Take 1 tablet by mouth at bedtime.    [provider]  nebivolol (BYSTOLIC) 5 MG tablet Take 1 tablet (5 mg total) by mouth daily. Patient taking differently: Take 5 mg by mouth at bedtime. 11/05/22   [name], MD  potassium chloride (KLOR-CON M10) 10 MEQ tablet TAKE 4 TABLETS BY MOUTH TWICE A DAY 06/05/23   [name], MD  SOTALOL AF 120 MG TABS TAKE 1 & 1/2 TABLETS BY MOUTH TWICE DAILY 01/05/23   [name], MD  spironolactone (ALDACTONE) 25 MG tablet Take 0.5 tablets by  mouth daily. 05/29/23 05/28/24  [provider]  tadalafil (CIALIS) 5 MG tablet Take 5 mg by mouth daily as needed for erectile dysfunction.    [provider]     Allergies:    Allergies  Allergen Reactions   Bee Venom Anaphylaxis   Lidocaine Other (See Comments)    IV formulation [name]    Social History:   Social History   Socioeconomic History   Marital status: Married    Spouse name: Not on file   Number of children: 0   Years of education: Not on file   Highest education level: Not on file  Occupational History   Occupation: EMT/PARAMEDIC  Tobacco Use   Smoking status: Former    Current packs/day: 0.00    Average packs/day: 1 pack/day for 18.0 years (18.0 ttl pk-yrs)    Types: Cigarettes    Start date: 01/17/1990    Quit date: 08/19/2007    Years since quitting: 15.8   Smokeless tobacco: Never  Vaping Use   Vaping status: Never Used  Substance and Sexual Activity   Alcohol use: No    Alcohol/week: 0.0 standard drinks of alcohol   Drug use: No   Sexual activity: Yes  Other Topics Concern   Not on file  Social History Narrative   Works as a Passenger transport manager Strain: Low Risk  (05/23/2023)   Received from [facility]   Overall Financial Resource Strain (CARDIA)    Difficulty of Paying Living Expenses: Not hard at all  Food Insecurity: No Food Insecurity (05/23/2023)   [facility]   Hunger Vital Sign    Worried About Running Out of Food in the Last Year: Never true    Ran Out of Food in the Last Year: Never true  Transportation Needs: Unknown (05/23/2023)   Received from [facility]    In the past 12 months, has lack of transportation kept you from medical appointments or from getting medications?: No    Lack of Transportation (Non-Medical): Not on file  Physical Activity: Not on file  Stress: Not  on file  Social Connections: Not on file  Intimate Partner Violence: Not At Risk (05/19/2023)   Humiliation, Afraid, Rape, and Kick questionnaire    Fear of Current or Ex-Partner: No    Emotionally Abused: No    Physically Abused: No    Sexually Abused: No    Family History:   The patient's family history includes Asthma in his mother; Breast cancer in an other family member; Colon cancer in an other family member; Depression in an other family member; Diabetes in an other family member; Drug abuse in an other family member; Heart attack in an other family member; Heart disease in his maternal grandfather and paternal grandfather; Hypertension in his paternal grandmother; Lung cancer in his maternal grandfather; Ovarian cancer in an other family member; Prostate cancer in an other family member; Stroke in an other family member; Sudden death in his father; Uterine cancer in an other family member.    ROS:  Please see the history of present illness.  All other ROS reviewed and negative.     Physical Exam/Data:   Vitals:   06/26/23 2149  BP: (!) 140/85  Pulse: (!) 58  Resp: 18  Temp: 98 F (36.7 C)  TempSrc: Oral  SpO2: 98%   No intake or output data in the 24 hours ending 06/27/23 0049    06/19/2023   10:09 AM 05/05/2023    3:17 PM 04/15/2023   12:25 PM  Last 3 Weights  Weight (lbs) 203 lb 6.4 oz 212 lb 2 oz 213 lb 8 oz  Weight (kg) 92.262 kg 96.219 kg 96.843 kg     There is no height or weight on file to calculate BMI.  General:  Well nourished, well developed, in no acute distress HEENT: normal Neck: no JVD Vascular: No carotid bruits; Distal pulses 2+ bilaterally   Cardiac:  normal S1, S2; RRR; no murmur  Lungs:  clear to auscultation bilaterally, no wheezing, rhonchi or rales  Abd: soft, nontender, no hepatomegaly  Ext: no edema Musculoskeletal:  No deformities, BUE and BLE strength normal and equal Skin: warm and dry  Neuro:  CNs 2-12 intact, no focal abnormalities  noted Psych:  Normal affect    EKG:  The ECG that was done  was personally reviewed and demonstrates (pending)  Relevant CV Studies: # Cardiac MRI 05/26/23 performed at  [facility]: Findings: 1. The left ventricle is normal in cavity size, wall thickness. Systolic function is mildly reduced. The calculated LV ejection fraction is 47%. There is mild hypokinesis in multiple segments (see bulls eye plot below). 2. The right ventricle is moderately enlarged. There is thinning and akinesis of the diaphragmatic inferior wall at the basal and midventricular portions and the lateral free wall at the midventricular portion. There is relatively preserved contractility of the RV apical segments. The thinned regions appear to have fatty metaplasia and are consistent with a diagnosis of arrhythmogenic ventricular cardiomyopathy. An RV lead is present and inserts into the superior portion of the apical RV side of the septum.  3. Left atrium is normal in size, right atrium is mildly enlarged.  4. The aortic valve is trileaflet in morphology. There is normal AV opening and no significant regurgitation. There is mild prolapse of the posterior leaflet of the mitral valve without any significant MV regurgitation. There is trivial tricuspid and trivial pulmonic valve regurgitation. 5. Delayed enhancement imaging is abnormal. There is scarring of the thinned regions of the RV free wall consistent with arrhythmogenic ventricular cardiomyopathy. There is also subendocardial scarring in multiple regions of the left ventricle (see bulls eye plot below). This could reflect CAD however, there is reportedly history of multiple VT ablations (albeit it is unclear if ablations included the LV or only in the RV). 6. No intracardiac thrombus are seen. 7. The pericardium is normal in thickness. There is no pericardial effusion seen.   Laboratory Data:  High Sensitivity Troponin:   Recent Labs  Lab  06/26/23 2206  TROPONINIHS 13      Chemistry Recent Labs  Lab 06/26/23 2206  NA 139  K 4.5  CL 105  CO2 27  GLUCOSE 103*  BUN 8  CREATININE 0.96  CALCIUM 9.4  MG 2.0  GFRNONAA >60  ANIONGAP 7    No results for input(s): "PROT", "ALBUMIN", "AST", "ALT", "ALKPHOS", "BILITOT" in the last 168 hours. Lipids No results for input(s): "CHOL", "TRIG", "HDL", "LABVLDL", "LDLCALC", "CHOLHDL" in the last 168 hours. Hematology Recent Labs  Lab 06/26/23 2206  WBC 9.2  RBC 4.67  HGB 14.2  HCT 42.5  MCV 91.0  MCH 30.4  MCHC 33.4  RDW 12.3  PLT 215   Thyroid No results for input(s): "TSH", "FREET4" in the last 168 hours. BNPNo results for input(s): "BNP", "PROBNP" in the last 168 hours.  DDimer No results for input(s): "DDIMER" in the last 168 hours.   Radiology/Studies:  No results found.   Assessment and Plan:   Ventricular tachycardia: Patient with recurrent sustained and non-sustained ventricular tachycardia events without ICD defibrillation due to being under tachy-therapy zone.  Secondary to arrhythmogenic cardiomyopathy.  Telemetry with recurrent non-sustained ventricular tachycardia.  Electrolytes are normal.  Patient currently takes flecainide 100 mg twice daily and sotalol 180 mg twice daily.  Also taking nebivolol.   --Admit for observation, may consider amiodarone bolus if needed. --Discuss case with electrophysiology.     Arrhythmogenic cardiomyopathy: Known history of arrhythmogenic cardiomyopathy complicated with ventricular tachycardia and mild left ventricular failure (EF 47%)).  Patient is now status post ICD placement.  Currently taking spironolactone.  On sotalol and flecainide for tachyarrhythmia management. --Further management outpatient.    ICD in-situ: Secondary prevention for arrhythmogenic cardiomyopathy.  Single lead Medtronic ICD.  Interrogation demonstrated.....  Generalized anxiety: Likely secondary to medical condition.   --Continue home  alprazolam.   --Can consider referral to mental  health provider.    Risk Assessment/Risk Scores:          Code Status: Full Code  Severity of Illness: The appropriate patient status for this patient is INPATIENT. Inpatient status is judged to be reasonable and necessary in order to provide the required intensity of service to ensure the patient's safety. The patient's presenting symptoms, physical exam findings, and initial radiographic and laboratory data in the context of their chronic comorbidities is felt to place them at high risk for further clinical deterioration. Furthermore, it is not anticipated that the patient will be medically stable for discharge from the hospital within 2 midnights of admission.   * I certify that at the point of admission it is my clinical judgment that the patient will require inpatient hospital care spanning beyond 2 midnights from the point of admission due to high intensity of service, high risk for further deterioration and high frequency of surveillance required.*   For questions or updates, please contact [facility] Please consult [email or web address] for contact info under     Signed, Judie Grieve, MD  06/27/2023 12:49 AM    I have seen, examined the patient, and reviewed the above assessment and plan.    HPI: [name] is a 47 y.o. male with a significant past medical history of arrhythmogenic cardiomyopathy status post Medtronic ICD with recent generator change in 2021, status post ventricular tachycardia catheter ablation and recent endocardial/epicardial ablation, sleep apnea, asthma, fatty liver disease, generalized anxiety disorder, and osteoporosis who is being seen 06/27/2023 for the evaluation of palpitations and noted sustained VT on ICD interrogation. Patient had been doing relatively well since his VT ablation at [facility] in October. He had his flecainide decreased from 100mg  down to 50mg  two days ago due to LBBB on ECG. He  has since developed palpitations and presented to ED. No device shocks or syncope.   GEN: No acute distress.   Cardiac: RRR, no murmurs, rubs, or gallops.  Psych: Normal affect   Assessment: [name] is a 47 y.o. male with a significant past medical history of arrhythmogenic cardiomyopathy status post Medtronic ICD with recent generator change in 2021, status post ventricular tachycardia catheter ablation and recent endocardial/epicardial ablation, sleep apnea, asthma, fatty liver disease, generalized anxiety disorder, and osteoporosis who now presents with recurrent MMVT after decreasing his flecainide.   Did have sustained episode on ICD interrogation, longest lasting 1 min and 36 seconds, spontaneously terminating. While in ED he has had frequent runs of NSVT. Rates are below device therapy zone. Episodes appear to have decreased in frequency since getting his morning sotalol and flecainide.   Plan:  # ARVD s/p multiple ablations # Incessant nonsustained MMVT: Occurring every few minutes but lasting <30s while in ED, only 2 lead telemetry available, but LBB morphology in VI with an inferior axis, CL ~450ms. - Increase flecainide back to 100mg . Was reportedly well controlled with this. Will monitor QRS duration. This is not a long term solution. He has intolerance to lidocaine reportedly and would like to avoid amiodarone given likely need for repeat ablation - with epicardial approach.  - Continue sotalol.  - Will give IV metoprolol and IV lorazepam as we wait on additional flecainide to take effect.   - Pursue transfer to [facility] for epicardial ablation as well as potential transplant evaluation.  - Given that his VT has been non-sustained and hemodynamically tolerated, will not lower treatment threshold on his device while admitted in order to prevent  shocks.    [name] Putnam, MD 06/27/2023 10:25 AM

## 2023-06-30 NOTE — Telephone Encounter (Signed)
Noted  

## 2023-07-03 ENCOUNTER — Ambulatory Visit: Payer: BC Managed Care – PPO

## 2023-07-09 ENCOUNTER — Telehealth: Payer: Self-pay

## 2023-07-09 NOTE — Transitions of Care (Post Inpatient/ED Visit) (Signed)
07/09/2023  Name: [name] MRN: [id number] DOB: 01-05-1976  Today's TOC FU Call Status: Today's TOC FU Call Status:: Successful TOC FU Call Completed TOC FU Call Complete Date: 07/08/23 Patient's Name and Date of Birth confirmed.  Transition Care Management Follow-up Telephone Call Date of Discharge: 07/08/23 Discharge Facility: Other ([facility]) Name of Other ([facility]) [facility]charge Facility: [facility] Type of Discharge: Inpatient Admission Primary Inpatient Discharge Diagnosis:: V-tach with Ablation How have you been since you were released from the hospital?: Better Any questions or concerns?: No  Items Reviewed: Did you receive and understand the discharge instructions provided?: Yes Medications obtained,verified, and reconciled?: Yes (Medications Reviewed) Any new allergies since your discharge?: No Dietary orders reviewed?: Yes Type of Diet Ordered:: Heart Healthy Do you have support at home?: No  Medications Reviewed Today: Medications Reviewed Today     Reviewed by Redge Gainer, RN (Case Manager) on 07/09/23 at 1513  Med List Status: <None>   Medication Order Taking? Sig Documenting Provider Last Dose Status Informant  alendronate (FOSAMAX) 70 MG tablet [id number] No Take 70 mg by mouth every Sunday. [provider] 06/21/2023 Active Self, Pharmacy Records  ALPRAZolam Prudy Feeler) 0.25 MG tablet [id number] No Take 1 tablet (0.25 mg total) by mouth 2 (two) times daily as needed for anxiety. [name], [name], MD 06/26/2023 Active Self, Pharmacy Records  Calcium Citrate-Vitamin D (CITRACAL + D PO) [id number] No Take 1 tablet by mouth 2 (two) times daily. [provider] 06/26/2023 am Active Self, Pharmacy Records  Cholecalciferol (VITAMIN D-3 PO) [id number] No Take 1 capsule by mouth at bedtime. [provider] 06/25/2023 pm Active Self, Pharmacy Records  fexofenadine (ALLEGRA) 180 MG tablet [id number] No Take 180 mg by mouth daily.  [provider] 06/26/2023 am Active Self, Pharmacy Records  flecainide (TAMBOCOR) 100 MG tablet [id number] No Take 50 mg by mouth 2 (two) times daily. [provider] 06/26/2023 pm Active Self, Pharmacy Records  magnesium oxide (MAG-OX) 400 (240 Mg) MG tablet [id number] No TAKE 1 TABLET BY MOUTH TWICE A DAY [name], MD 06/26/2023 pm Active Self, Pharmacy Records  montelukast (SINGULAIR) 10 MG tablet [id number] No Take 1 tablet (10 mg total) by mouth at bedtime. [name], [name], MD 06/26/2023 pm Active Self, Pharmacy Records  Multiple Vitamin (MULITIVITAMIN WITH MINERALS) TABS [id number] No Take 1 tablet by mouth daily. [provider] 06/26/2023 am Active Self, Pharmacy Records  nebivolol (BYSTOLIC) 5 MG tablet [id number] No Take 1 tablet (5 mg total) by mouth daily.  Patient taking differently: Take 5 mg by mouth at bedtime.   [name], MD 06/26/2023 1800 Active Self, Pharmacy Records  potassium chloride (KLOR-CON M10) 10 MEQ tablet [id number] No TAKE 4 TABLETS BY MOUTH TWICE A DAY [name], MD 06/26/2023 pm Active Self, Pharmacy Records  sertraline (ZOLOFT) 25 MG tablet [id number] No Take 25 mg by mouth daily. [provider] 06/26/2023 am Active Self, Pharmacy Records  SOTALOL AF 120 MG TABS [id number] No TAKE 1 & 1/2 TABLETS BY MOUTH TWICE DAILY [name], MD 06/26/2023 2000 Active Self, Pharmacy Records           Med Note (COFFELL, Marzella Schlein   Tue May 19, 2023  6:58 PM) Pt verified he is taking both Sotalol and Nebivolol.  spironolactone (ALDACTONE) 25 MG tablet [id number] No Take 0.5 tablets by mouth daily. [provider] 06/26/2023 am Active Self, Pharmacy Records  tadalafil (CIALIS) 5 MG tablet [id number] No Take 5 mg by  mouth daily as needed for erectile dysfunction. [provider] Past Week Active Self, Pharmacy Records  Zinc 30 MG TABS [id number] No Take 30 mg by mouth at bedtime. [provider] 06/25/2023 pm Active Self,  Pharmacy Records            Home Care and Equipment/Supplies: Were Home Health Services Ordered?: NA Any new equipment or medical supplies ordered?: NA  Functional Questionnaire: Do you need assistance with bathing/showering or dressing?: No Do you need assistance with meal preparation?: No Do you need assistance with eating?: No Do you have difficulty maintaining continence: No Do you need assistance with getting out of bed/getting out of a chair/moving?: No Do you have difficulty managing or taking your medications?: No  Follow up appointments reviewed: PCP Follow-up appointment confirmed?: No (The patient declines. States he is following up with is Development worker, international aid) MD Provider Line [phone] Given: Yes Specialist Hospital Follow-up appointment confirmed?: Yes Date of Specialist follow-up appointment?: 07/23/23 Follow-Up Specialty Provider:: Francis Dowse Do you need transportation to your follow-up appointment?: No Do you understand care options if your condition(s) worsen?: Yes-patient verbalized understanding  SDOH Interventions Today    Flowsheet Row Most Recent Value  SDOH Interventions   Food Insecurity Interventions Intervention Not Indicated  Housing Interventions Intervention Not Indicated  Utilities Interventions Intervention Not Indicated      Outreach to the patient today. He was in the car with his wife and children. He is a bit overwhelmed by his hospital stay. He reports some fatigue which he anticipates will improve with time. He has follow up appointments with his cardiologist and declines follow up with PCP today. No struggles with SDOH. He understands his discharge instructions. Insurance benefit of Short Term Disability in use. Educated the patient on how to contact providers with questions or concerns. No further follow indicated.  The patient has been provided with contact information for the care management team and has been advised to call with  any health-related questions or concerns. The patient verbalized understanding with current POC. The patient is directed to their insurance card regarding availability of benefits coverage.  Deidre Ala, RN Medical illustrator [facility] [phone]

## 2023-07-12 ENCOUNTER — Encounter: Payer: Self-pay | Admitting: Internal Medicine

## 2023-07-13 ENCOUNTER — Telehealth: Payer: Self-pay | Admitting: Family Medicine

## 2023-07-13 NOTE — Telephone Encounter (Signed)
Will see him then

## 2023-07-13 NOTE — Telephone Encounter (Signed)
Patient was scheduled to be seen in office 11/26.

## 2023-07-13 NOTE — Telephone Encounter (Signed)
FYI: This call has been transferred to Access Nurse. Once the result note has been entered staff can address the message at that time.  Patient called in with the following symptoms:  Red Word: left arm pain   Please advise at Mobile [phone] (mobile)  Message is routed to Provider Pool and [facility]    Pt called in stating he recently had heart surgery & was now experiencing left arm pain. Pt stated he didn't believe the pain was due to anything heart related. No other symptoms. Scheduled pt for tomorrow, 11/26 @ 12pm with Dr. Milinda Antis. Transferred to access nurse.

## 2023-07-14 ENCOUNTER — Ambulatory Visit (INDEPENDENT_AMBULATORY_CARE_PROVIDER_SITE_OTHER): Payer: BC Managed Care – PPO | Admitting: Family Medicine

## 2023-07-14 ENCOUNTER — Other Ambulatory Visit: Payer: Self-pay | Admitting: Orthopedic Surgery

## 2023-07-14 ENCOUNTER — Other Ambulatory Visit: Payer: BC Managed Care – PPO

## 2023-07-14 ENCOUNTER — Encounter: Payer: Self-pay | Admitting: Family Medicine

## 2023-07-14 VITALS — BP 102/66 | HR 60 | Temp 98.0°F | Ht 69.0 in | Wt 196.0 lb

## 2023-07-14 DIAGNOSIS — F411 Generalized anxiety disorder: Secondary | ICD-10-CM

## 2023-07-14 DIAGNOSIS — M79632 Pain in left forearm: Secondary | ICD-10-CM | POA: Diagnosis not present

## 2023-07-14 NOTE — Assessment & Plan Note (Signed)
Suspect multifactorial after hospitalization with A line and ivs in this arm  There is swelling and bruising consistent with hematoma or phlebitis in left lateral forearm, but no redness or streaking or warmth Also positive tinel/phalen test in left wrist (could be CTS from swelling) Pt saw hand specialist this am Given wrist splint for night Ordered doppler of forearm also  Will wait for that  Call back and Er precautions noted in detail today  -esp if any signs and symptoms of infection or loss of circulation

## 2023-07-14 NOTE — Progress Notes (Signed)
Subjective:    Patient ID: [name], male    DOB: 07/22/1976, 47 y.o.   MRN: [id number]  HPI  Wt Readings from Last 3 Encounters:  07/14/23 196 lb (88.9 kg)  06/19/23 203 lb 6.4 oz (92.3 kg)  05/05/23 212 lb 2 oz (96.2 kg)   28.94 kg/m  Vitals:   07/14/23 1151  BP: 102/66  Pulse: 60  Temp: 98 F (36.7 C)  SpO2: 98%     Pt presents with c/o left arm pain  Does not think it is related to heart at all   Also anxiety    Recent cardiology admittion for V tach Changes made as follows from d/c hosp - AAD on discharge: flecainide 50 BID, sotalol 180 BID, mexilitine 150 BID - continued on nebivolol  - increased spironolactone to 25 daily - decreased potassium supplementation to 40 daily  - transitioned magnesium oxide to magnesium glycinate for increased bioavailability - started 14 day course of colchicine 0.3 BID and ibuprofen 600 Q8 - started short course of klonopin PRN for anxiety   Recently had non displaced fracture of scaphoid/ right wrist  Had visit this am and also had eval of the left arm too  Discussed tendonitis or carpal tunnel from fluid retention  Could not r/o blood clot -- he sent off referral for utlratsound    Ever since her was d/c from hospital- pain in left arm (more than expected for having IVs) This is the arm they had the A line in 07/02/23 20 G catheter  Also ultrasound guided IVs From elbow down  Not improving much  Is tender to the touch and sensitive  Had a knot he could feel   In hospital he had a vein in left forearm that would "not compress"   Hand feels like it is burning some times  Not numb   Elbow feels ok  Neck feels fine     For mood Sent him home with some klonopin  Will run out before next psych appointment   Did well with one a day  Friday had several episodes so since then taking 2 a day    Psychiatrist - next appointment on 12/9  [place]  [employer]   Klonopin 0.5 mg bid prn   (from EP  team at [facility])- had 11/20 - given 20 pills which is a 10 day supply  Alprazolam prior  11/1 28 of those -not using  Sertraline 25 mg week 3     Patient Active Problem List   Diagnosis Date Noted   Left forearm pain 07/14/2023   PVC's (premature ventricular contractions) 06/17/2023   VT (ventricular tachycardia) (HCC) 05/19/2023   Subacute cough 04/15/2023   Post-viral cough syndrome 04/15/2023   Ventricular tachycardia (HCC) 02/28/2023   Acute pain of right wrist 01/26/2023   Scaphoid fracture of wrist 01/26/2023   Sinus bradycardia 11/04/2022   Seborrheic keratoses 09/01/2022   Stucco keratoses 09/01/2022   Angioma of skin 09/01/2022   Lentigo 09/01/2022   Fever and chills 09/01/2022   Myalgia 09/01/2022   COVID-19 09/01/2022   Nausea and vomiting 06/03/2022   History of hypokalemia 06/03/2022   Generalized abdominal pain 06/03/2022   Right elbow pain 05/02/2022   Fatigue 05/02/2022   Serum calcium elevated 05/02/2022   Paresthesia of left arm 01/23/2022   Cough 01/01/2022   Localized osteoporosis with current pathological fracture 10/24/2021   Osteopenia 06/18/2021   Genital warts 06/03/2021   Condyloma 06/03/2021  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   Other skin changes due to chronic exposure to nonionizing radiation 06/03/2021   Lumbar radiculopathy 05/14/2021   Stress reaction 03/07/2021   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HCC) 09/14/2019   Kidney stone 06/13/2019   Wart 05/24/2019   BPH (benign prostatic hyperplasia) 07/27/2018   Elevated glucose level 04/06/2017   GAD (generalized anxiety disorder) 08/16/2016   Routine general medical examination at a health care facility 03/28/2016   Hypertriglyceridemia 02/21/2014   Low HDL (under 40) 02/21/2014   Vitamin D deficiency 02/21/2014   Allergic rhinitis 02/21/2014   Erectile dysfunction 08/19/2013   OSA (obstructive sleep apnea) 10/30/2011   Environmental allergies 10/27/2011   High risk  medications (not anticoagulants) long-term use 10/27/2011   Hypokalemia 09/29/2011   Arrhythmogenic RV cardiomyopathy 03/04/2011   Implantable cardioverter-defibrillator (ICD) in situ 01/10/2011   PTSD (post-traumatic stress disorder) 2/2 ICD shocks 01/10/2011   Fatty liver 01/04/2009   GERD 12/05/2008   History of ventricular tachycardia 11/03/2008   ECZEMA 08/17/2007   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   Asthma when younger   Fatty liver    History of chicken pox    History of kidney stones    MDT DVFB1D1 Visia AF MRI VR  ICD (implantable cardiac defibrillator) in place    Osteopenia    Pancreatitis    Sleep apnea    uses cpap   Ventricular tachycardia (HCC)    sotalol therapy;  catheter ablation at [facility] 10/10 and [facility] 2013   Past Surgical History:  Procedure Laterality Date   CARDIAC CATHETERIZATION  06/01/2006   CARDIAC DEFIBRILLATOR PLACEMENT  06/03/2006   [employer]   CHOLECYSTECTOMY N/A 08/01/2021   Procedure: LAPAROSCOPIC CHOLECYSTECTOMY WITH INTRAOPERATIVE CHOLANGIOGRAM;  Surgeon: Harriette Bouillon, MD;  Location: [facility] ORS;  Service: General;  Laterality: N/A;   HAND SURGERY Left    ICD GENERATOR CHANGEOUT N/A 11/04/2019   Procedure: ICD GENERATOR CHANGEOUT;  Surgeon: [name], MD;  Location: [facility] INVASIVE CV LAB;  Service: Cardiovascular;  Laterality: N/A;   IMPLANTABLE CARDIOVERTER DEFIBRILLATOR GENERATOR CHANGE N/A 12/11/2011   Procedure: IMPLANTABLE CARDIOVERTER DEFIBRILLATOR GENERATOR CHANGE;  Surgeon: [name], MD;  Location: [facility] CATH LAB;  Service: Cardiovascular;  Laterality: N/A;   KYPHOPLASTY N/A 05/14/2021   Procedure: Lumbar Five Vertebroplasty;  Surgeon: Jadene Pierini, MD;  Location: [facility] OR;  Service: Neurosurgery;  Laterality: N/A;   LUMBAR LAMINECTOMY/ DECOMPRESSION WITH  MET-RX N/A 05/14/2021   Procedure: Lumbar Four-Five Minimally Invasive Laminectomy;  Surgeon: Jadene Pierini, MD;  Location: [facility] OR;  Service: Neurosurgery;  Laterality: N/A;  3C/RM 19   TONSILLECTOMY     vt ablation  10/10, 10/13   x 2   WISDOM TOOTH EXTRACTION     Social History   Tobacco Use   Smoking status: Former    Current packs/day: 0.00    Average packs/day: 1 pack/day for 18.0 years (18.0 ttl pk-yrs)    Types: Cigarettes    Start date: 01/17/1990    Quit date: 08/19/2007    Years since quitting: 15.9   Smokeless tobacco: Never  Vaping Use   Vaping status: Never Used  Substance Use Topics   Alcohol use: No    Alcohol/week: 0.0 standard drinks of alcohol   Drug use: No   Family History  Problem  Relation Age of Onset   Sudden death Father    Asthma Mother    Diabetes Other    Stroke Other    Heart attack Other    Prostate cancer Other    Breast cancer Other    Ovarian cancer Other    Uterine cancer Other    Colon cancer Other    Drug abuse Other    Depression Other    Lung cancer Maternal Grandfather    Heart disease Maternal Grandfather    Heart disease Paternal Grandfather    Hypertension Paternal Grandmother    Allergies  Allergen Reactions   Bee Venom Anaphylaxis   Lidocaine Other (See Comments)    IV formulation [name]  Was given while already being seen for Vtach, so possibly given while in a "storm".   Current Outpatient Medications on File Prior to Visit  Medication Sig Dispense Refill   alendronate (FOSAMAX) 70 MG tablet Take 70 mg by mouth every Sunday.     Calcium Citrate-Vitamin D (CITRACAL + D PO) Take 1 tablet by mouth 2 (two) times daily.     Cholecalciferol (VITAMIN D-3 PO) Take 1 capsule by mouth at bedtime.     clonazePAM (KLONOPIN) 0.5 MG tablet Take 0.5 mg by mouth 2 (two) times daily as needed.     fexofenadine (ALLEGRA) 180 MG tablet Take 180 mg by mouth daily.     flecainide (TAMBOCOR) 100 MG tablet Take 50 mg by mouth 2  (two) times daily.     Magnesium Gluconate 250 MG TABS Take 1 tablet by mouth in the morning and at bedtime.     mexiletine (MEXITIL) 150 MG capsule Take 150 mg by mouth 2 (two) times daily.     montelukast (SINGULAIR) 10 MG tablet Take 1 tablet (10 mg total) by mouth at bedtime. 90 tablet 1   Multiple Vitamin (MULITIVITAMIN WITH MINERALS) TABS Take 1 tablet by mouth daily.     nebivolol (BYSTOLIC) 5 MG tablet Take 1 tablet (5 mg total) by mouth daily. (Patient taking differently: Take 5 mg by mouth at bedtime.) 90 tablet 3   potassium chloride SA (KLOR-CON M) 20 MEQ tablet Take 40 mEq by mouth daily.     sertraline (ZOLOFT) 25 MG tablet Take 25 mg by mouth daily.     sotalol (BETAPACE) 120 MG tablet Take 1.5 tablets by mouth 2 (two) times daily.     spironolactone (ALDACTONE) 25 MG tablet Take 0.5 tablets by mouth daily.     tadalafil (CIALIS) 5 MG tablet Take 5 mg by mouth daily as needed for erectile dysfunction.     Zinc 30 MG TABS Take 30 mg by mouth at bedtime.     No current facility-administered medications on file prior to visit.    Review of Systems  Constitutional:  Negative for activity change, appetite change, fatigue, fever and unexpected weight change.  HENT:  Negative for congestion, rhinorrhea, sore throat and trouble swallowing.   Eyes:  Negative for pain, redness, itching and visual disturbance.  Respiratory:  Negative for cough, chest tightness, shortness of breath and wheezing.   Cardiovascular:  Negative for chest pain and palpitations.  Gastrointestinal:  Negative for abdominal pain, blood in stool, constipation, diarrhea and nausea.  Endocrine: Negative for cold intolerance, heat intolerance, polydipsia and polyuria.  Genitourinary:  Negative for difficulty urinating, dysuria, frequency and urgency.  Musculoskeletal:  Negative for arthralgias, joint swelling and myalgias.       Left forearm soreness/pain /tenderness and  mild swelling  Skin:  Negative for pallor and  rash.  Neurological:  Negative for dizziness, tremors, weakness, numbness and headaches.  Hematological:  Negative for adenopathy. Does not bruise/bleed easily.  Psychiatric/Behavioral:  Positive for sleep disturbance. Negative for decreased concentration and dysphoric mood. The patient is nervous/anxious.        Objective:   Physical Exam Constitutional:      General: He is not in acute distress.    Appearance: Normal appearance. He is well-developed and normal weight. He is not ill-appearing or diaphoretic.  HENT:     Head: Normocephalic and atraumatic.  Eyes:     Conjunctiva/sclera: Conjunctivae normal.     Pupils: Pupils are equal, round, and reactive to light.  Neck:     Thyroid: No thyromegaly.     Vascular: No carotid bruit or JVD.  Cardiovascular:     Rate and Rhythm: Normal rate and regular rhythm.     Heart sounds: Normal heart sounds.     No gallop.  Pulmonary:     Effort: Pulmonary effort is normal. No respiratory distress.     Breath sounds: Normal breath sounds. No wheezing or rales.  Abdominal:     General: There is no distension or abdominal bruit.     Palpations: Abdomen is soft.  Musculoskeletal:     Cervical back: Normal range of motion and neck supple.     Right lower leg: No edema.     Left lower leg: No edema.     Comments: Area of linear swelling and mild tenderness on lateral left forearm with associated old ecchymosis  No erythema or streaking  Normal pulses in wrist and rom wrist and elbow No elbow tenderness  Lymphadenopathy:     Cervical: No cervical adenopathy.  Skin:    General: Skin is warm and dry.     Coloration: Skin is not pale.     Findings: Bruising present. No erythema or rash.  Neurological:     Mental Status: He is alert.     Sensory: No sensory deficit.     Motor: No weakness.     Coordination: Coordination normal.     Gait: Gait normal.     Deep Tendon Reflexes: Reflexes are normal and symmetric. Reflexes normal.      Comments: Positive tinel and phalen tests in LUE wrist for hand symptoms   Psychiatric:        Mood and Affect: Mood normal.        Cognition and Memory: Cognition and memory normal.     Comments: Pleasant Mildly anxious Candidly discusses symptoms and stressors             Assessment & Plan:   Problem List Items Addressed This Visit       Other   Left forearm pain - Primary    Suspect multifactorial after hospitalization with A line and ivs in this arm  There is swelling and bruising consistent with hematoma or phlebitis in left lateral forearm, but no redness or streaking or warmth Also positive tinel/phalen test in left wrist (could be CTS from swelling) Pt saw hand specialist this am Given wrist splint for night Ordered doppler of forearm also  Will wait for that  Call back and Er precautions noted in detail today  -esp if any signs and symptoms of infection or loss of circulation       GAD (generalized anxiety disorder)    Pt was recently hospitalized and xanax was changed to klonopin  due to longer acting nature Continues sertraline 25 mg (week 3)  Encouraged to check in with psychiatrist to discuss change in benzo If abs necessary (if psych is not reachable) we may need to refill to get by to appointment on 12/9  Discussed sedating potential and habit forming potential of this class of med Pt voices understanding   Dealing with PTSD from heart issues and ICD firing  Going through a lot currently   Candidly discusses symptoms and stressors   Encouraged to continue counseling in addition to psychiatry

## 2023-07-14 NOTE — Progress Notes (Unsigned)
Cardiology Office Note:  .   Date:  07/14/2023  ID:  [name], DOB 1975/08/21, MRN [id number] PCP: Judy Pimple, MD  [facility] Providers Cardiologist:  Sherryl Manges, MD Electrophysiologist:  Sherryl Manges, MD {  History of Present Illness: .   [name] is a 47 y.o. 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   Hospitalized 7/24 following ICD shock--apparently his endocrinologist had had him decrease his magnesium because of concerns of GI side effects preceding this event by about a week. Chronic sotalol. Discussions were had regarding amiodarone deferred given his young age, and augmented beta-blockers given his bradycardia   Hospitalized 10/24 with recurrent ventricular tachycardia. Flecainide was added to the sotalol. He underwent ablation at [facility] 10/9 done endocardially. Had episodes of sustained ventricular tachycardia following the procedure with consideration for recurrent epicardial ablation if recurrent ventricular tachycardia   cMRI which showed mod RV enlargement w/ RV scarring and fatty infiltration c/w ARVC, mild LV dysfxn (LVEF 47%), multiple subendo scarring could be CAD vs prior VT ablation, LV scar 7%.   Saw Dr. Graciela Husbands 06/19/23, new LBBB, discussed management strategies including repeat ablation and if unable to control his VT perhaps an indication for transplant Maintained on sotalol, flecainide and nebivolol without change though did mention may need to dose reduce the flecainide for new LBBB Encouraged ongoing counseling and management of depression/anxiety Advised holding off on colonoscopy (to evaluate chronic diarrhea)  Admitted 06/26/23, reported palpitations with recent down-titration of his flecainide to 50mg  BID, and new start up of zoloft >> transferred to [facility] on 06/27/23 11/13, Dr. Wilford Grist  proceeded with an endocardial ablation. Patient continued to have refractory VT post ablation. On 11/14, patient therefore underwent epicardial ablation with Dr. Wilford Grist.  Lidocaine gtt >> mexiletine Treated for post procedure pericarditis (to complete 11/29) Echo 06/2023 with EF >55%, no WMA, RV apex hypertrabeculated, and RV fx normal. cMRI 05/2023 with moderately enlarged RA with thinning and akinesis of segments with fatty metaplasia  Discharged 07/09/23 on flecainide 50mg  BID, Sotalol 180mg  BID, mexiletine 150mg  BID, nebivolol 5mg  HS   Today's visit is scheduled as a hospital f/u   ROS: ***  *** EKG *** VT? *** meds?   Device information MDT single chamber ICD Implanted 06/04/2006, gen change 12/11/2011, 11/04/2019  Arrhythmia/AAD hx catheter ablation [facility] )  endo and epi-cardial ablation at [facility]  Repeat VT ablation [facility] (again endocardial 07/01/23 > epicardial) 07/02/23) ON: Sotalol Flecainide Mexiletine  OFF ranexa  Studies Reviewed: Marland Kitchen    EKG done today and reviewed by myself:  ***  DEVICE interrogation done today and reviewed by myself *** Battery and lead measurements are good ***   Echo 07/03/23 CONCLUSION ------------------------------------------------------------------------------- NORMAL LEFT VENTRICULAR SYSTOLIC FUNCTION WITH NO LVH ESTIMATED EF: 55% DIASTOLIC FUNCTION CAN'T BE DETERMINED MODERATE RIGHT VENTRICULAR SYSTOLIC DYSFUNCTION VALVULAR REGURGITATION: No AR, TRIVIAL MR, No PR, TRIVIAL TR NO VALVULAR STENOSIS   05/26/23: c.MRI CONCLUSION. Moderate RV enlargement with significant RV scarring and fatty infiltration consistent with  ARVC. There is also mild LV systolic dysfunction (EF is 47%) with multiple subendocardial scarring which  could reflect CAD (albeit if VT ablation was performed in the LV, this could reflect prior ablations).    Risk Assessment/Calculations:    Physical Exam:   VS:  There were no vitals taken for this  visit.   Wt  Readings from Last 3 Encounters:  06/19/23 203 lb 6.4 oz (92.3 kg)  05/05/23 212 lb 2 oz (96.2 kg)  04/15/23 213 lb 8 oz (96.8 kg)    GEN: Well nourished, well developed in no acute distress NECK: No JVD; No carotid bruits CARDIAC: ***RRR, no murmurs, rubs, gallops RESPIRATORY:  *** CTA b/l without rales, wheezing or rhonchi  ABDOMEN: Soft, non-tender, non-distended EXTREMITIES:  No edema; No deformity   ICD site: *** is stable, no thinning, fluctuation, tethering  ASSESSMENT AND PLAN: .    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 stress test, cath, DCCV, TEE, etc)?   Press F2        :[id number]}     Dispo: ***  Signed, Sheilah Pigeon, PA-C

## 2023-07-14 NOTE — Assessment & Plan Note (Addendum)
Pt was recently hospitalized and xanax was changed to klonopin due to longer acting nature Reviewed hospital records, lab results and studies in detail   Continues sertraline 25 mg (week 3)  Encouraged to check in with psychiatrist to discuss change in benzo If abs necessary (if psych is not reachable) we may need to refill to get by to appointment on 12/9  Discussed sedating potential and habit forming potential of this class of med Pt voices understanding   Dealing with PTSD from heart issues and ICD firing  Going through a lot currently   Candidly discusses symptoms and stressors   Encouraged to continue counseling in addition to psychiatry

## 2023-07-14 NOTE — Telephone Encounter (Signed)
F/u on remote transmission ~ 1 NSVT(<20 beats) , 1 SVT, 31sec, HR 214.  No therapy.   Patient has f/u apt with Renee. Routing to [place] to see if any changes need to be made prior to OV on 07/20/23.   Please see mychart message from 07/13/23.

## 2023-07-14 NOTE — Patient Instructions (Addendum)
Get your ultrasound as planned   Wear carpal tunnel wrist splint at night   Use a gentle warm compress on your arm    Check in with your psychiatrist  If you end up needing refill of clonazepam because they cannot refill to get you to that appointment let me know   Take care of yourself

## 2023-07-15 ENCOUNTER — Emergency Department (HOSPITAL_COMMUNITY): Payer: BC Managed Care – PPO

## 2023-07-15 ENCOUNTER — Telehealth: Payer: Self-pay

## 2023-07-15 ENCOUNTER — Emergency Department (HOSPITAL_COMMUNITY)
Admission: EM | Admit: 2023-07-15 | Discharge: 2023-07-15 | Disposition: A | Discharge status: Home / Self Care | Payer: BC Managed Care – PPO

## 2023-07-15 ENCOUNTER — Other Ambulatory Visit: Payer: Self-pay

## 2023-07-15 ENCOUNTER — Telehealth: Payer: Self-pay | Admitting: Family Medicine

## 2023-07-15 ENCOUNTER — Inpatient Hospital Stay
Admission: RE | Admit: 2023-07-15 | Discharge: 2023-07-15 | Discharge status: Home / Self Care | Payer: BC Managed Care – PPO | Source: Ambulatory Visit | Attending: Orthopedic Surgery | Admitting: Orthopedic Surgery

## 2023-07-15 ENCOUNTER — Encounter (HOSPITAL_COMMUNITY): Payer: Self-pay | Admitting: Emergency Medicine

## 2023-07-15 DIAGNOSIS — R0602 Shortness of breath: Secondary | ICD-10-CM | POA: Diagnosis not present

## 2023-07-15 DIAGNOSIS — Z7901 Long term (current) use of anticoagulants: Secondary | ICD-10-CM | POA: Insufficient documentation

## 2023-07-15 DIAGNOSIS — Z79899 Other long term (current) drug therapy: Secondary | ICD-10-CM | POA: Insufficient documentation

## 2023-07-15 DIAGNOSIS — I82612 Acute embolism and thrombosis of superficial veins of left upper extremity: Secondary | ICD-10-CM | POA: Insufficient documentation

## 2023-07-15 DIAGNOSIS — M79632 Pain in left forearm: Secondary | ICD-10-CM

## 2023-07-15 DIAGNOSIS — M79602 Pain in left arm: Secondary | ICD-10-CM | POA: Diagnosis present

## 2023-07-15 DIAGNOSIS — R911 Solitary pulmonary nodule: Secondary | ICD-10-CM | POA: Diagnosis not present

## 2023-07-15 LAB — BRAIN NATRIURETIC PEPTIDE: B Natriuretic Peptide: 254.2 pg/mL — ABNORMAL HIGH (ref 0.0–100.0)

## 2023-07-15 LAB — I-STAT CHEM 8, ED
BUN: 11 mg/dL (ref 6–20)
Calcium, Ion: 1.21 mmol/L (ref 1.15–1.40)
Chloride: 102 mmol/L (ref 98–111)
Creatinine, Ser: 1 mg/dL (ref 0.61–1.24)
Glucose, Bld: 93 mg/dL (ref 70–99)
HCT: 41 % (ref 39.0–52.0)
Hemoglobin: 13.9 g/dL (ref 13.0–17.0)
Potassium: 4.6 mmol/L (ref 3.5–5.1)
Sodium: 142 mmol/L (ref 135–145)
TCO2: 28 mmol/L (ref 22–32)

## 2023-07-15 LAB — CBC WITH DIFFERENTIAL/PLATELET
Abs Immature Granulocytes: 0.03 10*3/uL (ref 0.00–0.07)
Basophils Absolute: 0.1 10*3/uL (ref 0.0–0.1)
Basophils Relative: 1 %
Eosinophils Absolute: 0.2 10*3/uL (ref 0.0–0.5)
Eosinophils Relative: 2 %
HCT: 41.2 % (ref 39.0–52.0)
Hemoglobin: 13.5 g/dL (ref 13.0–17.0)
Immature Granulocytes: 0 %
Lymphocytes Relative: 31 %
Lymphs Abs: 2.8 10*3/uL (ref 0.7–4.0)
MCH: 30.7 pg (ref 26.0–34.0)
MCHC: 32.8 g/dL (ref 30.0–36.0)
MCV: 93.6 fL (ref 80.0–100.0)
Monocytes Absolute: 0.8 10*3/uL (ref 0.1–1.0)
Monocytes Relative: 8 %
Neutro Abs: 5.4 10*3/uL (ref 1.7–7.7)
Neutrophils Relative %: 58 %
Platelets: 307 10*3/uL (ref 150–400)
RBC: 4.4 MIL/uL (ref 4.22–5.81)
RDW: 12.6 % (ref 11.5–15.5)
WBC: 9.2 10*3/uL (ref 4.0–10.5)
nRBC: 0 % (ref 0.0–0.2)

## 2023-07-15 LAB — BASIC METABOLIC PANEL
Anion gap: 8 (ref 5–15)
BUN: 8 mg/dL (ref 6–20)
CO2: 26 mmol/L (ref 22–32)
Calcium: 9.4 mg/dL (ref 8.9–10.3)
Chloride: 103 mmol/L (ref 98–111)
Creatinine, Ser: 0.89 mg/dL (ref 0.61–1.24)
GFR, Estimated: >60 mL/min (ref >60)
Glucose, Bld: 92 mg/dL (ref 70–99)
Potassium: 4.4 mmol/L (ref 3.5–5.1)
Sodium: 137 mmol/L (ref 135–145)

## 2023-07-15 LAB — TROPONIN I (HIGH SENSITIVITY)
Troponin I (High Sensitivity): 21 ng/L — ABNORMAL HIGH (ref <18)
Troponin I (High Sensitivity): 22 ng/L — ABNORMAL HIGH (ref <18)

## 2023-07-15 MED ORDER — APIXABAN (ELIQUIS) VTE STARTER PACK (10MG AND 5MG): ORAL_TABLET | ORAL | 0 refills | Status: DC

## 2023-07-15 MED ORDER — APIXABAN 5 MG PO TABS
10.0000 mg | ORAL_TABLET | Freq: Once | ORAL | Status: AC
  Administered 2023-07-15: 10 mg via ORAL
  Filled 2023-07-15: qty 2

## 2023-07-15 MED ORDER — MORPHINE SULFATE (PF) 4 MG/ML IV SOLN
4.0000 mg | Freq: Once | INTRAVENOUS | Status: AC
  Administered 2023-07-15: 4 mg via INTRAVENOUS
  Filled 2023-07-15: qty 1

## 2023-07-15 MED ORDER — IOHEXOL 350 MG/ML SOLN
75.0000 mL | Freq: Once | INTRAVENOUS | Status: AC | PRN
  Administered 2023-07-15: 75 mL via INTRAVENOUS

## 2023-07-15 NOTE — Discharge Instructions (Addendum)
Please follow-up with your cardiologist and primary care provider regards recent ER visit.  As discussed you are given your first dose of Eliquis here and I have sent it to the 24/7 pharmacy on [address] for you to pick up.  Please take as prescribed.  Please avoid any situations or activities that may result in any trauma to your body or falls as you are susceptible to bleeds.  Your CT scan today also show that you have a nodule on your lung that will need to be followed up with your primary care provider as well.  If symptoms change or worsen please return to ER.

## 2023-07-15 NOTE — Telephone Encounter (Signed)
  Patient is at his cardiology appointment now to find out what he could not do because he is fresh out of heart ablations (I.e. can he take blood thinners, etc)  He asked his wife Victorino Dike to call to speak with Dr. Milinda Antis re:  [facility] imaging. Test was upgraded to a stat to radiologist for official reading  Patient was informed that he had a blood clot in systalic vein from wrist to elbow  Transferring call to acces nurse for further questioning

## 2023-07-15 NOTE — Telephone Encounter (Signed)
It looks like he had superficial thrombus but not DVT and it was the hand specialist who ordered the test   Per epic  IMPRESSION: 1. Positive for superficial venous thrombosis of the distal left cephalic vein. No evidence of DVT in the left upper extremity.  Sounds like the hand specialist was talking to them Will watch for updates

## 2023-07-15 NOTE — ED Triage Notes (Signed)
Pt reports left arm pain. Pt states he had [place] today and was told he had a thrombus in the left cephalic vein. Pt reports he was sent to the ER due to cardiac ablation 2 weeks ago and PCP did not want to start blood thinners per patient.

## 2023-07-15 NOTE — Telephone Encounter (Signed)
Pt walked into our office to report had [place] of arm and was told he has a DVT.  [facility]ame to [facility] for [facility].  Front desk called triage.   I advised front desk to tell pt ordering provider should f/u concern.  Pt left our office.  I called pt stressed that pt contact ordering provider for further direction.  Testing was not recommended by our provider.  Pt reports someone from another office was calling in while on the phone with me. Pt reports needed to take call will f/u with provider.

## 2023-07-15 NOTE — Telephone Encounter (Signed)
[name]-can you talk to a patient? It is actually [name]'s wife...[phone]   just saw this are they still on phone?   yes. I am on hold with access nurse. can you look at my note?   i sent it to you and [name]....  Amy front office mgr transferred call to me and pt was on the phone. After pt gave me his name and DOB  pt said that Dr Virgina Jock office was on the other line and he was going to talk with them since they ordered the US Venous Img Upper Uni left (DVT).Pt said nothing further needed from me and I voiced understanding  Call was concluded. Sending note to Dr Milinda Antis as Lorain Childes.

## 2023-07-15 NOTE — ED Provider Notes (Signed)
[name] Provider Note   CSN: [id number] Arrival date & time: 07/15/23  1625     History  Chief Complaint  Patient presents with   Arm Pain    [name] is a 47 y.o. male history of V. tach, ARVC, cardiac ablations, ICD presented after an ultrasound that shows a left superficial venous thrombosis in the distal left cephalic vein.  Patient states that has had multiple cardiac ablations within the past month and the most recent being 2 weeks ago when he was at [name].  Since then patient states had progressive shortness of breath on exertion but also notes that has had some shortness of breath at rest as well.  Patient had ultrasound done by orthopedics due to pain in the left upper extremity that does show superficial venous thrombosis.  Patient denies previous history of blood clots or chest pain or nausea vomiting or fevers.   Home Medications Prior to Admission medications   Medication Sig Start Date End Date Taking? Authorizing Provider  alendronate (FOSAMAX) 70 MG tablet Take 70 mg by mouth every Sunday. 02/13/23   [provider]  APIXABAN Everlene Balls) VTE STARTER PACK (10MG  AND 5MG ) Take as directed on package: start with two-5mg  tablets twice daily for 7 days. On day 8, switch to one-5mg  tablet twice daily. 07/15/23   [name], PA-C  Calcium Citrate-Vitamin D (CITRACAL + D PO) Take 1 tablet by mouth 2 (two) times daily.    [provider]  Cholecalciferol (VITAMIN D-3 PO) Take 1 capsule by mouth at bedtime.    [provider]  clonazePAM (KLONOPIN) 0.5 MG tablet Take 0.5 mg by mouth 2 (two) times daily as needed. 07/08/23 07/22/23  [provider]  fexofenadine (ALLEGRA) 180 MG tablet Take 180 mg by mouth daily.    [provider]  flecainide (TAMBOCOR) 100 MG tablet Take 50 mg by mouth 2 (two) times daily. 05/29/23 05/28/24  [provider]  Magnesium Gluconate 250 MG TABS  Take 1 tablet by mouth in the morning and at bedtime.    [provider]  mexiletine (MEXITIL) 150 MG capsule Take 150 mg by mouth 2 (two) times daily. 07/08/23 07/07/24  [provider]  montelukast (SINGULAIR) 10 MG tablet Take 1 tablet (10 mg total) by mouth at bedtime. 06/05/23   [name], [name], MD  Multiple Vitamin (MULITIVITAMIN WITH MINERALS) TABS Take 1 tablet by mouth daily.    [provider]  nebivolol (BYSTOLIC) 5 MG tablet Take 1 tablet (5 mg total) by mouth daily. Patient taking differently: Take 5 mg by mouth at bedtime. 11/05/22   [name], MD  potassium chloride SA (KLOR-CON M) 20 MEQ tablet Take 40 mEq by mouth daily.    [provider]  sertraline (ZOLOFT) 25 MG tablet Take 25 mg by mouth daily. 06/16/23   [provider]  sotalol (BETAPACE) 120 MG tablet Take 1.5 tablets by mouth 2 (two) times daily. 07/08/23   [provider]  spironolactone (ALDACTONE) 25 MG tablet Take 0.5 tablets by mouth daily. 05/29/23 05/28/24  [provider]  tadalafil (CIALIS) 5 MG tablet Take 5 mg by mouth daily as needed for erectile dysfunction.    [provider]  Zinc 30 MG TABS Take 30 mg by mouth at bedtime.    [provider]      Allergies    Bee venom    Review of Systems   Review  of Systems  Physical Exam Updated Vital Signs BP 115/69   Pulse (!) 55   Temp 98.3 F (36.8 C) (Oral)   Resp 13   Ht 5\' 9"  (1.753 m)   Wt 89 kg   SpO2 99%   BMI 28.98 kg/m  Physical Exam Vitals reviewed.  Constitutional:      General: He is not in acute distress. HENT:     Head: Normocephalic and atraumatic.  Eyes:     Extraocular Movements: Extraocular movements intact.     Conjunctiva/sclera: Conjunctivae normal.     Pupils: Pupils are equal, round, and reactive to light.  Cardiovascular:     Rate and Rhythm: Normal rate and regular rhythm.     Pulses: Normal pulses.     Heart sounds: Normal heart  sounds.     Comments: 2+ bilateral radial/dorsalis pedis pulses with regular rate Tender to palpation to left upper extremity veins on the flexor side Pulmonary:     Effort: Pulmonary effort is normal. No respiratory distress.     Breath sounds: Normal breath sounds.     Comments: Able to speak in full sentences Abdominal:     Palpations: Abdomen is soft.     Tenderness: There is no abdominal tenderness. There is no guarding or rebound.  Musculoskeletal:        General: Normal range of motion.     Cervical back: Normal range of motion and neck supple.     Right lower leg: No edema.     Left lower leg: No edema.     Comments: 5 out of 5 bilateral grip/leg extension strength No calf tenderness or leg swelling noted  Skin:    General: Skin is warm and dry.     Capillary Refill: Capillary refill takes less than 2 seconds.  Neurological:     General: No focal deficit present.     Mental Status: He is alert and oriented to person, place, and time.     Comments: Sensation intact in all 4 limbs  Psychiatric:        Mood and Affect: Mood normal.     ED Results / Procedures / Treatments   Labs (all labs ordered are listed, but only abnormal results are displayed) Labs Reviewed  BRAIN NATRIURETIC PEPTIDE - Abnormal; Notable for the following components:      Result Value   B Natriuretic Peptide 254.2 (*)    All other components within normal limits  TROPONIN I (HIGH SENSITIVITY) - Abnormal; Notable for the following components:   Troponin I (High Sensitivity) 21 (*)    All other components within normal limits  TROPONIN I (HIGH SENSITIVITY) - Abnormal; Notable for the following components:   Troponin I (High Sensitivity) 22 (*)    All other components within normal limits  BASIC METABOLIC PANEL  CBC WITH DIFFERENTIAL/PLATELET  I-STAT CHEM 8, ED    EKG None  Radiology CT Angio Chest PE W/Cm &/Or Wo Cm  Result Date: 07/15/2023 CLINICAL DATA:  Recent cardiac surgeries with  shortness of breath and known superficial venous thrombosis EXAM: CT ANGIOGRAPHY CHEST WITH CONTRAST TECHNIQUE: Multidetector CT imaging of the chest was performed using the standard protocol during bolus administration of intravenous contrast. Multiplanar CT image reconstructions and MIPs were obtained to evaluate the vascular anatomy. RADIATION DOSE REDUCTION: This exam was performed according to the departmental dose-optimization program which includes automated exposure control, adjustment of the mA and/or kV according to patient size and/or use of iterative reconstruction technique.  CONTRAST:  75mL OMNIPAQUE IOHEXOL 350 MG/ML SOLN COMPARISON:  None Available. FINDINGS: Cardiovascular: There is adequate opacification of the pulmonary arterial tree. No intraluminal filling defect identified to suggest acute pulmonary embolism. The central pulmonary arteries are of normal caliber. No significant coronary artery calcification. There is mild cardiomegaly with enlargement of the right ventricle and right atrium. Right subclavian single lead pacemaker defibrillator in place with its lead within the right ventricular apex. No pericardial effusion. The thoracic aorta is unremarkable. Mediastinum/Nodes: No enlarged mediastinal, hilar, or axillary lymph nodes. Thyroid gland, trachea, and esophagus demonstrate no significant findings. Lungs/Pleura: 4 mm subpleural pulmonary nodule, left upper lobe, axial image # 75/6, indeterminate. Lungs are otherwise clear. No pneumothorax or pleural effusion. No central obstructing lesion. Upper Abdomen: No acute abnormality.  Status post cholecystectomy Musculoskeletal: No chest wall abnormality. No acute or significant osseous findings. Review of the MIP images confirms the above findings. IMPRESSION: 1. No pulmonary embolism. No acute intrathoracic pathology identified. 2. Mild cardiomegaly with enlargement of the right ventricle and right atrium. 3. 4 mm subpleural pulmonary nodule,  left upper lobe, indeterminate. No follow-up needed if patient is low-risk.This recommendation follows the consensus statement: Guidelines for Management of Incidental Pulmonary Nodules Detected on CT Images: From the Fleischner Society 2017; Radiology 2017; 284:228-243. Electronically Signed   By: Helyn Numbers M.D.   On: 07/15/2023 19:09   US Venous Img Upper Uni Left (DVT)  Result Date: 07/15/2023 CLINICAL DATA:  Left forearm pain. EXAM: LEFT UPPER EXTREMITY VENOUS DOPPLER ULTRASOUND TECHNIQUE: Gray-scale sonography with graded compression, as well as color Doppler and duplex ultrasound were performed to evaluate the upper extremity deep venous system from the level of the subclavian vein and including the jugular, axillary, basilic, radial, ulnar and upper cephalic vein. Spectral Doppler was utilized to evaluate flow at rest and with distal augmentation maneuvers. COMPARISON:  None Available. FINDINGS: Contralateral Subclavian Vein: Respiratory phasicity is normal and symmetric with the symptomatic side. No evidence of thrombus. Normal compressibility. Internal Jugular Vein: No evidence of thrombus. Normal compressibility, respiratory phasicity and response to augmentation. Subclavian Vein: No evidence of thrombus. Normal compressibility, respiratory phasicity and response to augmentation. Axillary Vein: No evidence of thrombus. Normal compressibility, respiratory phasicity and response to augmentation. Cephalic Vein: Positive for occlusive thrombus, extending from the antecubital fossa to the wrist. Basilic Vein: No evidence of thrombus. Normal compressibility, respiratory phasicity and response to augmentation. Brachial Veins: No evidence of thrombus. Normal compressibility, respiratory phasicity and response to augmentation. Radial Veins: No evidence of thrombus. Normal compressibility, respiratory phasicity and response to augmentation. Ulnar Veins: No evidence of thrombus. Normal compressibility,  respiratory phasicity and response to augmentation. Venous Reflux:  None visualized. Other Findings:  None visualized. IMPRESSION: 1. Positive for superficial venous thrombosis of the distal left cephalic vein. No evidence of DVT in the left upper extremity. These results will be called to the ordering clinician or representative by the Radiologist Assistant, and communication documented in the PACS or [employer]. Electronically Signed   By: Obie Dredge M.D.   On: 07/15/2023 15:07    Procedures Procedures    Medications Ordered in ED Medications  iohexol (OMNIPAQUE) 350 MG/ML injection 75 mL (75 mLs Intravenous Contrast Given 07/15/23 1819)  morphine (PF) 4 MG/ML injection 4 mg (4 mg Intravenous Given 07/15/23 2010)  apixaban (ELIQUIS) tablet 10 mg (10 mg Oral Given 07/15/23 2046)    ED Course/ Medical Decision Making/ A&P  Medical Decision Making Amount and/or Complexity of Data Reviewed Labs: ordered. Radiology: ordered.   RAJESH DHALIWAL 47 y.o. presented today for superficial venous thrombosis. Working DDx that I considered at this time includes, but not limited to, superficial venous thrombosis, DVT, PE, ACS, heart failure.  R/o DDx: DVT, PE, ACS, heart failure: These are considered less likely due to history of present illness, physical exam, labs/imaging findings  Review of prior external notes: 07/15/2023 office visit  Unique Tests and My Interpretation:  CBC: Unremarkable BMP: Unremarkable BMP: 254.2 I-STAT Chem-8: Unremarkable Troponin: 21, 22 CTA chest: Pulmonary nodule noted, mild cardiomegaly EKG: Sinus 56 bpm, first-degree heart block, no ST elevations or depressions noted  Social Determinants of Health: none  Discussion with Independent Historian:  Mother  Discussion of Management of Tests:  Boneta Lucks, ED Pharmacist  Risk: Medium: prescription drug management  Risk Stratification Score: None  Staffed with Young,  DO  Plan: On exam patient was in no acute distress with stable vitals.  Patient had outpatient ultrasound that does show superficial venous thrombosis but also endorses shortness of breath that has been progressive since his last cardiac ablation.  Given patient's high risk nature a D-dimer cannot be ordered as he already has a confirmed superficial venous thrombosis and so we will get CTA and labs were ordered as well along with EKG.  Patient's exam was otherwise unremarkable outside of being tender to the veins in the left upper extremity on the flexor side.  I spoke to the pharmacist who states that patient can be placed on Eliquis even though he has had a cardiac ablation 2 weeks ago as they state this is perfectly reasonable and so if everything is negative will discharge on Eliquis.  Patient stable at this time.  CTA does show cardiomegaly along with pulmonary nodule that will require follow-up as patient is high risk.  BNP was added on to rule out heart failure as patient is endorsing dyspnea on exertion but does not appear fluid overloaded on exam.  For troponin was also mildly elevated at 21 and so we will get a second 1 as well.  If negative will have him follow-up with his cardiologist along with primary care provider in regards to recent superficial venous thrombosis.  Rest of labs are reassuring.  I spoke to the patient about the risks and benefit of Eliquis as patient will need Eliquis for his VTE however patient along with his wife and mother wanted to have a second opinion and so the attending was notified and spoke to the patient as well.  Patient agreed to the anticoagulation and so we will give his first dose here before discharge and prescribed the rest and encouraged to follow-up with his cardiologist.  I spoke to the patient about the lung nodules as well and will have him follow-up with his primary care provider to have this monitored.  Patient was given return precautions. Patient  stable for discharge at this time.  Patient verbalized understanding of plan.  This chart was dictated using voice recognition software.  Despite best efforts to proofread,  errors can occur which can change the documentation meaning.         Final Clinical Impression(s) / ED Diagnoses Final diagnoses:  Superficial venous thrombosis of left upper extremity  Pulmonary nodule    Rx / DC Orders ED Discharge Orders          Ordered    APIXABAN (ELIQUIS) VTE STARTER PACK (10MG  AND 5MG )  Status:  Discontinued       Note to Pharmacy: If starter pack unavailable, substitute with seventy-four 5 mg apixaban tabs following the above SIG directions.   07/15/23 2042    APIXABAN (ELIQUIS) VTE STARTER PACK (10MG  AND 5MG )       Note to Pharmacy: If starter pack unavailable, substitute with seventy-four 5 mg apixaban tabs following the above SIG directions.   07/15/23 2155              [name], PA-C 07/15/23 2157    Coral Spikes, DO 07/15/23 2346

## 2023-07-20 ENCOUNTER — Ambulatory Visit: Payer: BC Managed Care – PPO | Admitting: Physician Assistant

## 2023-07-20 DIAGNOSIS — I82612 Acute embolism and thrombosis of superficial veins of left upper extremity: Secondary | ICD-10-CM | POA: Insufficient documentation

## 2023-07-20 NOTE — Telephone Encounter (Signed)
Pt is scheduled to see Dr Graciela Husbands on 07/21/2023.

## 2023-07-20 NOTE — Progress Notes (Unsigned)
Subjective:    Patient ID: [name], male    DOB: 1976-02-25, 47 y.o.   MRN: [id number]  HPI  Wt Readings from Last 3 Encounters:  07/21/23 192 lb 4 oz (87.2 kg)  07/21/23 193 lb 6 oz (87.7 kg)  07/15/23 196 lb 3.4 oz (89 kg)   27.59 kg/m  Vitals:   07/21/23 1214  BP: 118/74  Pulse: 62  Temp: 98.2 F (36.8 C)  SpO2: 99%     Pt presents for follow up of superficial thrombosis in left forearm  Pt was seen in ER on 07/15/23 Left forearm pain (s/p catheter)  [place] noted a left superficial venous thrombosis in distal left cephalic vein and c/o shortness of breath on exertion and sometimes rest  The [place] was ordered by a hand specialist/ortho (Dr Blanchie Dessert)   Multiple cardiac ablations in past month   After discussing with 2nd opinion decision was made to initiate anticoagulation with eliquis   [place] report IMPRESSION: 1. Positive for superficial venous thrombosis of the distal left cephalic vein. No evidence of DVT in the left upper extremity.  Has had a little nose bleed on/off  No other bleeding  Wears a mouth guard -had a little blood   Still some pain in his arm  No worse  Not using warm compress or compression sleeve    Talking about a heart transplant now It is anxiety provoking   Clonazepam 0.5 mg twice daily  Zoloft 50 mg daily  Psychiatry on 12/9       07/21/2023   12:27 PM 04/15/2023   12:34 PM 01/26/2023    2:30 PM 01/23/2022    8:32 AM 09/20/2021    9:12 AM  Depression screen PHQ 2/9  Decreased Interest 3 2 0 0 0  Down, Depressed, Hopeless 3 2 0 0 1  PHQ - 2 Score 6 4 0 0 1  Altered sleeping 3 1 0  2  Tired, decreased energy 3 3 0  1  Change in appetite 2 1 0  2  Feeling bad or failure about yourself  3 0 0  1  Trouble concentrating 3 3 0  1  Moving slowly or fidgety/restless 2 1 0  0  Suicidal thoughts 0 0 0  0  PHQ-9 Score 22 13 0  8  Difficult doing work/chores Extremely dIfficult Very difficult Not difficult at all        07/21/2023    12:28 PM 04/15/2023   12:34 PM  GAD 7 : Generalized Anxiety Score  Nervous, Anxious, on Edge 3 3  Control/stop worrying 3 3  Worry too much - different things 3 3  Trouble relaxing 3 2  Restless 2 1  Easily annoyed or irritable 2 3  Afraid - awful might happen 3 3  Total GAD 7 Score 19 18  Anxiety Difficulty Extremely difficult Very difficult       Labs Lab Results  Component Value Date   NA 142 07/15/2023   K 4.6 07/15/2023   CO2 26 07/15/2023   GLUCOSE 93 07/15/2023   BUN 11 07/15/2023   CREATININE 1.00 07/15/2023   CALCIUM 9.4 07/15/2023   GFR 105.03 02/10/2023   EGFR 113 01/07/2022   GFRNONAA >60 07/15/2023   Lab Results  Component Value Date   WBC 9.2 07/15/2023   HGB 13.9 07/15/2023   HCT 41.0 07/15/2023   MCV 93.6 07/15/2023   PLT 307 07/15/2023   Troponin 21 BNP 254  CTA IMPRESSION:  1. No pulmonary embolism. No acute intrathoracic pathology identified. 2. Mild cardiomegaly with enlargement of the right ventricle and right atrium. 3. 4 mm subpleural pulmonary nodule, left upper lobe, indeterminate. No follow-up needed if patient is low-risk.This recommendation follows the consensus statement: Guidelines for Management of Incidental Pulmonary Nodules Detected on CT Images: From the Fleischner Society 2017; Radiology 2017; 284:228-243.    Smoked in distant past  Quit prior to 2012  Smoked 46 yo to mid 20s    1 ppd     Patient Active Problem List   Diagnosis Date Noted   Superficial venous thrombosis of left upper extremity 07/20/2023   Left forearm pain 07/14/2023   PVC's (premature ventricular contractions) 06/17/2023   Subacute cough 04/15/2023   Post-viral cough syndrome 04/15/2023   Acute pain of right wrist 01/26/2023   Scaphoid fracture of wrist 01/26/2023   Seborrheic keratoses 09/01/2022   Stucco keratoses 09/01/2022   Angioma of skin 09/01/2022   Lentigo 09/01/2022   Fever and chills 09/01/2022   Myalgia 09/01/2022   Nausea and  vomiting 06/03/2022   History of hypokalemia 06/03/2022   Generalized abdominal pain 06/03/2022   Right elbow pain 05/02/2022   Fatigue 05/02/2022   Serum calcium elevated 05/02/2022   Paresthesia of left arm 01/23/2022   Localized osteoporosis with current pathological fracture 10/24/2021   Osteopenia 06/18/2021   Genital warts 06/03/2021   Condyloma 06/03/2021   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   Other skin changes due to chronic exposure to nonionizing radiation 06/03/2021   Lumbar radiculopathy 05/14/2021   Stress reaction 03/07/2021   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HCC) 09/14/2019   Kidney stone 06/13/2019   BPH (benign prostatic hyperplasia) 07/27/2018   Elevated glucose level 04/06/2017   GAD (generalized anxiety disorder) 08/16/2016   Routine general medical examination at a health care facility 03/28/2016   Hypertriglyceridemia 02/21/2014   Low HDL (under 40) 02/21/2014   Vitamin D deficiency 02/21/2014   Allergic rhinitis 02/21/2014   Erectile dysfunction 08/19/2013   OSA (obstructive sleep apnea) 10/30/2011   Environmental allergies 10/27/2011   High risk medications (not anticoagulants) long-term use 10/27/2011   Hypokalemia 09/29/2011   Arrhythmogenic RV cardiomyopathy 03/04/2011   Implantable cardioverter-defibrillator (ICD) in situ 01/10/2011   PTSD (post-traumatic stress disorder) 2/2 ICD shocks 01/10/2011   Fatty liver 01/04/2009   GERD 12/05/2008   History of ventricular tachycardia 11/03/2008   ECZEMA 08/17/2007   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   Asthma when younger   Fatty liver    History of chicken pox    History of kidney stones    MDT DVFB1D1 Visia AF MRI VR  ICD (implantable cardiac defibrillator) in place    Osteopenia    Pancreatitis    Sleep apnea     uses cpap   Ventricular tachycardia (HCC)    sotalol therapy;  catheter ablation at [facility] 10/10 and [facility] 2013   Past Surgical History:  Procedure Laterality Date   CARDIAC CATHETERIZATION  06/01/2006   CARDIAC DEFIBRILLATOR PLACEMENT  06/03/2006   Medtronic   CHOLECYSTECTOMY N/A 08/01/2021   Procedure: LAPAROSCOPIC CHOLECYSTECTOMY WITH INTRAOPERATIVE CHOLANGIOGRAM;  Surgeon: Harriette Bouillon, MD;  Location: [facility] ORS;  Service: General;  Laterality: N/A;   HAND SURGERY Left    ICD GENERATOR CHANGEOUT N/A 11/04/2019   Procedure: ICD  GENERATOR CHANGEOUT;  Surgeon: [name], MD;  Location: [facility] INVASIVE CV LAB;  Service: Cardiovascular;  Laterality: N/A;   IMPLANTABLE CARDIOVERTER DEFIBRILLATOR GENERATOR CHANGE N/A 12/11/2011   Procedure: IMPLANTABLE CARDIOVERTER DEFIBRILLATOR GENERATOR CHANGE;  Surgeon: [name], MD;  Location: [facility] CATH LAB;  Service: Cardiovascular;  Laterality: N/A;   KYPHOPLASTY N/A 05/14/2021   Procedure: Lumbar Five Vertebroplasty;  Surgeon: Jadene Pierini, MD;  Location: [facility] OR;  Service: Neurosurgery;  Laterality: N/A;   LUMBAR LAMINECTOMY/ DECOMPRESSION WITH MET-RX N/A 05/14/2021   Procedure: Lumbar Four-Five Minimally Invasive Laminectomy;  Surgeon: Jadene Pierini, MD;  Location: [facility] OR;  Service: Neurosurgery;  Laterality: N/A;  3C/RM 19   TONSILLECTOMY     vt ablation  10/10, 10/13   x 2   WISDOM TOOTH EXTRACTION     Social History   Tobacco Use   Smoking status: Former    Current packs/day: 0.00    Average packs/day: 1 pack/day for 18.0 years (18.0 ttl pk-yrs)    Types: Cigarettes    Start date: 01/17/1990    Quit date: 08/19/2007    Years since quitting: 15.9   Smokeless tobacco: Never  Vaping Use   Vaping status: Never Used  Substance Use Topics   Alcohol use: No    Alcohol/week: 0.0 standard drinks of alcohol   Drug use: No   Family History  Problem Relation Age of Onset   Sudden death Father    Asthma Mother    Diabetes  Other    Stroke Other    Heart attack Other    Prostate cancer Other    Breast cancer Other    Ovarian cancer Other    Uterine cancer Other    Colon cancer Other    Drug abuse Other    Depression Other    Lung cancer Maternal Grandfather    Heart disease Maternal Grandfather    Heart disease Paternal Grandfather    Hypertension Paternal Grandmother    Allergies  Allergen Reactions   Bee Venom Anaphylaxis   Vancomycin Itching and Other (See Comments)    Tolerated well after giving benadryl and doubling the infusion time   Current Outpatient Medications on File Prior to Visit  Medication Sig Dispense Refill   alendronate (FOSAMAX) 70 MG tablet Take 70 mg by mouth every Sunday.     APIXABAN (ELIQUIS) VTE STARTER PACK (10MG  AND 5MG ) Take as directed on package: start with two-5mg  tablets twice daily for 7 days. On day 8, switch to one-5mg  tablet twice daily. 74 each 0   Calcium Citrate-Vitamin D (CITRACAL + D PO) Take 1 tablet by mouth 2 (two) times daily.     Cholecalciferol (VITAMIN D-3 PO) Take 1 capsule by mouth at bedtime.     clonazePAM (KLONOPIN) 0.5 MG tablet Take 0.5 mg by mouth 2 (two) times daily as needed.     fexofenadine (ALLEGRA) 180 MG tablet Take 180 mg by mouth daily.     flecainide (TAMBOCOR) 100 MG tablet Take 50 mg by mouth 2 (two) times daily.     magnesium oxide (MAG-OX) 400 (240 Mg) MG tablet Take 400 mg by mouth 2 (two) times daily.     mexiletine (MEXITIL) 150 MG capsule Take 150 mg by mouth 2 (two) times daily.     montelukast (SINGULAIR) 10 MG tablet Take 1 tablet (10 mg total) by mouth at bedtime. 90 tablet 1   Multiple Vitamin (MULITIVITAMIN WITH MINERALS) TABS Take 1 tablet by mouth daily.  nebivolol (BYSTOLIC) 5 MG tablet Take 1 tablet (5 mg total) by mouth daily. (Patient taking differently: Take 5 mg by mouth at bedtime.) 90 tablet 3   potassium chloride SA (KLOR-CON M) 20 MEQ tablet Take 40 mEq by mouth daily.     sertraline (ZOLOFT) 50 MG tablet  Take 50 mg by mouth daily.     sotalol (BETAPACE) 120 MG tablet Take 1.5 tablets by mouth 2 (two) times daily.     spironolactone (ALDACTONE) 25 MG tablet Take 25 mg by mouth daily.     tadalafil (CIALIS) 5 MG tablet Take 5 mg by mouth daily as needed for erectile dysfunction.     Zinc 30 MG TABS Take 30 mg by mouth at bedtime.     No current facility-administered medications on file prior to visit.    Review of Systems  Constitutional:  Positive for fatigue. Negative for activity change, appetite change, fever and unexpected weight change.  HENT:  Negative for congestion, rhinorrhea, sore throat and trouble swallowing.   Eyes:  Negative for pain, redness, itching and visual disturbance.  Respiratory:  Negative for cough, chest tightness, shortness of breath and wheezing.   Cardiovascular:  Positive for palpitations. Negative for chest pain and leg swelling.       Left arm soreness and mild swelling  Gastrointestinal:  Negative for abdominal pain, blood in stool, constipation, diarrhea and nausea.  Endocrine: Negative for cold intolerance, heat intolerance, polydipsia and polyuria.  Genitourinary:  Negative for difficulty urinating, dysuria, frequency and urgency.  Musculoskeletal:  Negative for arthralgias, joint swelling and myalgias.  Skin:  Negative for pallor and rash.  Neurological:  Negative for dizziness, tremors, weakness, numbness and headaches.  Hematological:  Negative for adenopathy. Does not bruise/bleed easily.  Psychiatric/Behavioral:  Positive for dysphoric mood. Negative for decreased concentration. The patient is nervous/anxious.        Objective:   Physical Exam Constitutional:      General: He is not in acute distress.    Appearance: Normal appearance. He is well-developed and normal weight. He is not ill-appearing or diaphoretic.  HENT:     Head: Normocephalic and atraumatic.  Eyes:     Conjunctiva/sclera: Conjunctivae normal.     Pupils: Pupils are equal,  round, and reactive to light.  Neck:     Thyroid: No thyromegaly.     Vascular: No carotid bruit or JVD.  Cardiovascular:     Rate and Rhythm: Normal rate and regular rhythm.     Pulses: Normal pulses.     Heart sounds: Normal heart sounds.     No gallop.  Pulmonary:     Effort: Pulmonary effort is normal. No respiratory distress.     Breath sounds: Normal breath sounds. No wheezing or rales.  Abdominal:     General: There is no distension or abdominal bruit.     Palpations: Abdomen is soft.  Musculoskeletal:     Cervical back: Normal range of motion and neck supple.     Right lower leg: No edema.     Left lower leg: No edema.     Comments: Left forearm is softer and less swollen than last visit No palpable cord  No erythema/warmth or streaking   Normal pulses   Lymphadenopathy:     Cervical: No cervical adenopathy.  Skin:    General: Skin is warm and dry.     Coloration: Skin is not pale.     Findings: No rash.  Neurological:  Mental Status: He is alert.     Cranial Nerves: No cranial nerve deficit.     Motor: No weakness.     Coordination: Coordination normal.     Deep Tendon Reflexes: Reflexes are normal and symmetric. Reflexes normal.  Psychiatric:        Attention and Perception: Attention normal.        Mood and Affect: Mood is depressed. Affect is not tearful.        Speech: Speech normal.        Behavior: Behavior normal.        Cognition and Memory: Cognition and memory normal.           Assessment & Plan:   Problem List Items Addressed This Visit       Cardiovascular and Mediastinum   Superficial venous thrombosis of left upper extremity - Primary    Reviewed notes from ortho/hand specialist/ doppler report and ER records in detail  Currently on eliquis /starter pack No worsening of symptoms  On exam- arm seems less swollen and tender (reassuring) Agree with dec to anticoagulate to prevent extension of clot to deep system and minimize symptoms    Suspect will likely treat for 3 months but will confer with his EP specialist as well  Can consider vascular consult if symptoms worsen  Call back and Er precautions noted in detail today    Discussed precautions while on anticoagulation  If this needs to be held for any proceedures that would be ok       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HCC)    Seeing cardiology/ EP specialist Considering heart transplant at this point         Other   Stress reaction    Worsened dep/anx symptoms in setting of physical health problems  Taking klonopin 0.5 mg bkd and recently started sertraline 50 mg daily  Under psychiatric care/also counseling  Encouraged pt to reach out to providers today  If he needs refill of anything before next mental health appointment he will reach out to [place]   Good insight Good self care overall  Overwhelming stress as he may need a heart transplant in the future       Acute pain of right wrist    Healing wrist fracture Reviewed hand/ortho note

## 2023-07-21 ENCOUNTER — Ambulatory Visit (INDEPENDENT_AMBULATORY_CARE_PROVIDER_SITE_OTHER): Payer: BC Managed Care – PPO | Admitting: Family Medicine

## 2023-07-21 ENCOUNTER — Encounter: Payer: Self-pay | Admitting: Family Medicine

## 2023-07-21 ENCOUNTER — Encounter: Payer: Self-pay | Admitting: Internal Medicine

## 2023-07-21 ENCOUNTER — Ambulatory Visit: Payer: BC Managed Care – PPO | Attending: Internal Medicine | Admitting: Internal Medicine

## 2023-07-21 VITALS — BP 110/80 | HR 59 | Ht 70.0 in | Wt 193.4 lb

## 2023-07-21 VITALS — BP 118/74 | HR 62 | Temp 98.2°F | Ht 70.0 in | Wt 192.2 lb

## 2023-07-21 DIAGNOSIS — M25531 Pain in right wrist: Secondary | ICD-10-CM | POA: Diagnosis not present

## 2023-07-21 DIAGNOSIS — I428 Other cardiomyopathies: Secondary | ICD-10-CM

## 2023-07-21 DIAGNOSIS — I82612 Acute embolism and thrombosis of superficial veins of left upper extremity: Secondary | ICD-10-CM

## 2023-07-21 DIAGNOSIS — F430 Acute stress reaction: Secondary | ICD-10-CM | POA: Diagnosis not present

## 2023-07-21 DIAGNOSIS — Z95810 Presence of automatic (implantable) cardiac defibrillator: Secondary | ICD-10-CM | POA: Diagnosis not present

## 2023-07-21 DIAGNOSIS — I472 Ventricular tachycardia, unspecified: Secondary | ICD-10-CM

## 2023-07-21 NOTE — Assessment & Plan Note (Signed)
Worsened dep/anx symptoms in setting of physical health problems  Taking klonopin 0.5 mg bkd and recently started sertraline 50 mg daily  Under psychiatric care/also counseling  Encouraged pt to reach out to providers today  If he needs refill of anything before next mental health appointment he will reach out to [place]   Good insight Good self care overall  Overwhelming stress as he may need a heart transplant in the future

## 2023-07-21 NOTE — Assessment & Plan Note (Signed)
Reviewed notes from ortho/hand specialist/ doppler report and ER records in detail  Currently on eliquis /starter pack No worsening of symptoms  On exam- arm seems less swollen and tender (reassuring) Agree with dec to anticoagulate to prevent extension of clot to deep system and minimize symptoms   Suspect will likely treat for 3 months but will confer with his EP specialist as well  Can consider vascular consult if symptoms worsen  Call back and Er precautions noted in detail today    Discussed precautions while on anticoagulation  If this needs to be held for any proceedures that would be ok

## 2023-07-21 NOTE — Patient Instructions (Signed)
Medication Instructions:  Your physician recommends that you continue on your current medications as directed. Please refer to the Current Medication list given to you today.  *If you need a refill on your cardiac medications before your next appointment, please call your pharmacy*   Lab Work: None ordered.  If you have labs (blood work) drawn today and your tests are completely normal, you will receive your results only by: MyChart Message (if you have MyChart) OR A paper copy in the mail If you have any lab test that is abnormal or we need to change your treatment, we will call you to review the results.   Testing/Procedures: None ordered.    Follow-Up: At [facility], you and your health needs are our priority.  As part of our continuing mission to provide you with exceptional heart care, we have created designated Provider Care Teams.  These Care Teams include your primary Cardiologist (physician) and Advanced Practice Providers (APPs -  Physician Assistants and Nurse Practitioners) who all work together to provide you with the care you need, when you need it.  We recommend signing up for the patient portal called "MyChart".  Sign up information is provided on this After Visit Summary.  MyChart is used to connect with patients for Virtual Visits (Telemedicine).  Patients are able to view lab/test results, encounter notes, upcoming appointments, etc.  Non-urgent messages can be sent to your provider as well.   To learn more about what you can do with MyChart, go to [email or web address].    Your next appointment:   February 2025 with Dr Graciela Husbands

## 2023-07-21 NOTE — Assessment & Plan Note (Signed)
Seeing cardiology/ EP specialist Considering heart transplant at this point

## 2023-07-21 NOTE — Patient Instructions (Addendum)
Use warm compress on arm when you can for 10 minutes at a time   Continue the eliquis for now  When you need a refill let me know  If arm gets more sore/ swollen or any other new symptoms let us know   I will reach out to Dr Graciela Husbands about timing and next steps  We can have you see a vascular specialist if needed as well   Reach out to your psychiatrist before your appointment and let her know what is going on  If you cannot get though let us know (if you need anything)   Keep talking with counselors as well

## 2023-07-21 NOTE — Progress Notes (Signed)
Patient Care Team: [name], [name], MD as PCP - General (Family Medicine) [name], MD as PCP - Cardiology (Cardiology) [name], MD as PCP - Electrophysiology (Cardiology)   HPI  [name] is a 47 y.o. 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    He is status post catheter ablation [facility] ) and [facility]   Recent increase in PVC burden; event recorder 2/24 about 15% (PVCs plus couplets) 2 morphologies, both within intrinsicoid deflection of about 80 ms.  Some fatigue and brain fog; beta-blocker alternatives were tried 4/24  Hospitalized 7/24 following ICD shock--apparently his endocrinologist had had him decrease his magnesium because of concerns of GI side effects preceding this event by about a week.  Chronic sotalol.  Discussions were had regarding amiodarone deferred given his young age, and augmented beta-blockers given his bradycardia   Hospital lysed 10/24 with recurrent ventricular tachycardia.  Flecainide was added to the sotalol.  He underwent ablation at [facility] 10/9 done endocardially.  Had episodes of sustained ventricular tachycardia following the procedure with consideration for recurrent epicardial ablation if recurrent ventricular tachycardia  Recurrent ventricular tachycardia 11/24 transferred back to [facility] endocardial/epicardial ablation, limited success.  Postprocedural VT and then recurrent VT not treated 2/2 wavelet causing with holding of therapy.  Presyncopal and palpitations.  A.m. flutters.  Emotionally he remains devastated.  His wrist is recovering; he was diagnosed with a left arm superficial DVT that prompted anticoagulation.       Second daughter was born about December 2023.        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   Asthma when younger   Fatty liver    History of chicken pox    History of kidney stones    MDT DVFB1D1 Visia AF MRI VR  ICD (implantable cardiac defibrillator) in place    Osteopenia    Pancreatitis    Sleep apnea    uses cpap   Ventricular tachycardia (HCC)    sotalol therapy;  catheter ablation at [facility] 10/10 and [facility] 2013    Past Surgical History:  Procedure Laterality Date   CARDIAC CATHETERIZATION  06/01/2006   CARDIAC DEFIBRILLATOR PLACEMENT  06/03/2006   Medtronic   CHOLECYSTECTOMY N/A 08/01/2021   Procedure: LAPAROSCOPIC CHOLECYSTECTOMY WITH INTRAOPERATIVE CHOLANGIOGRAM;  Surgeon: Harriette Bouillon, MD;  Location: [facility] ORS;  Service: General;  Laterality: N/A;   HAND SURGERY Left    ICD GENERATOR CHANGEOUT N/A 11/04/2019   Procedure: ICD GENERATOR CHANGEOUT;  Surgeon: [name], MD;  Location: [facility] INVASIVE CV LAB;  Service: Cardiovascular;  Laterality: N/A;   IMPLANTABLE CARDIOVERTER DEFIBRILLATOR GENERATOR CHANGE N/A 12/11/2011   Procedure:  IMPLANTABLE CARDIOVERTER DEFIBRILLATOR GENERATOR CHANGE;  Surgeon: [name], MD;  Location: [facility] CATH LAB;  Service: Cardiovascular;  Laterality: N/A;   KYPHOPLASTY N/A 05/14/2021   Procedure: Lumbar Five Vertebroplasty;  Surgeon: Jadene Pierini, MD;  Location: [facility] OR;  Service: Neurosurgery;  Laterality: N/A;   LUMBAR LAMINECTOMY/ DECOMPRESSION WITH MET-RX N/A 05/14/2021   Procedure: Lumbar Four-Five Minimally Invasive Laminectomy;  Surgeon: Jadene Pierini, MD;  Location: [facility] OR;  Service: Neurosurgery;   Laterality: N/A;  3C/RM 19   TONSILLECTOMY     vt ablation  10/10, 10/13   x 2   WISDOM TOOTH EXTRACTION      Current Meds  Medication Sig   alendronate (FOSAMAX) 70 MG tablet Take 70 mg by mouth every Sunday.   APIXABAN (ELIQUIS) VTE STARTER PACK (10MG  AND 5MG ) Take as directed on package: start with two-5mg  tablets twice daily for 7 days. On day 8, switch to one-5mg  tablet twice daily.   Calcium Citrate-Vitamin D (CITRACAL + D PO) Take 1 tablet by mouth 2 (two) times daily.   Cholecalciferol (VITAMIN D-3 PO) Take 1 capsule by mouth at bedtime.   clonazePAM (KLONOPIN) 0.5 MG tablet Take 0.5 mg by mouth 2 (two) times daily as needed.   fexofenadine (ALLEGRA) 180 MG tablet Take 180 mg by mouth daily.   flecainide (TAMBOCOR) 100 MG tablet Take 50 mg by mouth 2 (two) times daily.   magnesium oxide (MAG-OX) 400 (240 Mg) MG tablet Take 400 mg by mouth 2 (two) times daily.   mexiletine (MEXITIL) 150 MG capsule Take 150 mg by mouth 2 (two) times daily.   montelukast (SINGULAIR) 10 MG tablet Take 1 tablet (10 mg total) by mouth at bedtime.   Multiple Vitamin (MULITIVITAMIN WITH MINERALS) TABS Take 1 tablet by mouth daily.   nebivolol (BYSTOLIC) 5 MG tablet Take 1 tablet (5 mg total) by mouth daily. (Patient taking differently: Take 5 mg by mouth at bedtime.)   potassium chloride SA (KLOR-CON M) 20 MEQ tablet Take 40 mEq by mouth daily.   sertraline (ZOLOFT) 50 MG tablet Take 50 mg by mouth daily.   sotalol (BETAPACE) 120 MG tablet Take 1.5 tablets by mouth 2 (two) times daily.   spironolactone (ALDACTONE) 25 MG tablet Take 25 mg by mouth daily.   tadalafil (CIALIS) 5 MG tablet Take 5 mg by mouth daily as needed for erectile dysfunction.   Zinc 30 MG TABS Take 30 mg by mouth at bedtime.    Allergies  Allergen Reactions   Bee Venom Anaphylaxis   Vancomycin Itching and Other (See Comments)    Tolerated well after giving benadryl and doubling the infusion time      Review of Systems  negative except from HPI and PMH  Physical Exam BP 110/80 (BP Location: Left Arm, Patient Position: Sitting, Cuff Size: Normal)   Pulse (!) 59   Ht 5\' 10"  (1.778 m)   Wt 193 lb 6 oz (87.7 kg)   SpO2 97%   BMI 27.75 kg/m  Well developed and well nourished in no acute distress HENT normal Neck supple with JVP-flat Clear Device pocket well healed; without hematoma or erythema.  There is no tethering  Regular rate and rhythm, no  gallop No  murmur Abd-soft with active BS No Clubbing cyanosis  edema Skin-warm and dry A & Oriented  Mood sober and tearfulGrossly normal sensory and motor function  ECG sinus at 59  23/15/47  Intercurrent self terminating ventricular tachycardia which was misdiagnosed by the  device because of the wavelet.   Device function is normal. Programming changes inactivate wavelet; detection for the VT zone was extended to 72 intervals approximately 20 seconds, in hopes that it would stop by itself; detection in the fast VT zone was left as it was but 3 bursts of VT were programmed   See Paceart for details    Device function is normal. Programming changes none  See Paceart for details       See Paceart for details    Estimated Creatinine Clearance: 104.1 mL/min (by C-G formula based on SCr of 1 mg/dL).   Assessment and  Plan ARVC-TMEM gene Positive   VT-recurrent status post ablation at [facility]   ICD   Medtronic    Sinus bradycardia  PVCs-frequent   High Risk Medication Surveillance-sotalol   PTSD     Fatigue  Upper extremity DVT  AFib false detections on device   Left bundle branch block-trransient    Recurrent ventricular tachycardia prompted repeat ablation effort at [facility].  Epicardial approach was undertaken but aborted because of inducible VF.  Brief discussions with Dr. 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  If he does not have transplant follow-up already. If he  wants to pursue additional ablation I can speak with. [place] or. [facility].  I just worry that they will try to be much more aggressive and will make him a more dire transplant candidate.  "  Has had intercurrent ventricular tachycardia duration unclear but greater than 30 seconds mis assigned by wavelet; have been activated.  For now we will continue the flecainide the mexiletine and sotalol.  Broached the topic of transplant as an alternative treatment strategy and suggested that it is not remote but more imminent.  I will reach out to Dr. Wynelle Link and have him take the reigns on this as he offered.  Tearful as we discussed this.  He has follow-up scheduled with Dr. Milinda Antis this afternoon and I asked him to discuss with her and with his other psych person augmentation of his psychotropic meds; he has remote consultation with Dr. Rhetta Mura done at [facility] who specialty is post ICD mental health issues.  DVT in the right arm it was started him on anticoagulation.  He has had some epistaxis.  Will have to keep track of this.  From what I read recently, superficial upper extremity DVT is not certain indication for anticoagulation although this was apparently quite extensive.   Spoke with his wife afterwards

## 2023-07-21 NOTE — Assessment & Plan Note (Signed)
Healing wrist fracture Reviewed hand/ortho note

## 2023-07-22 ENCOUNTER — Ambulatory Visit: Payer: BC Managed Care – PPO | Admitting: Family Medicine

## 2023-07-26 LAB — CUP PACEART INCLINIC DEVICE CHECK
Date Time Interrogation Session: 20241203181531
Implantable Lead Connection Status: 753985
Implantable Lead Implant Date: 20071018
Implantable Lead Location: 753860
Implantable Lead Model: 6947
Implantable Pulse Generator Implant Date: 20210319

## 2023-07-28 ENCOUNTER — Encounter: Payer: Self-pay | Admitting: Internal Medicine

## 2023-07-30 NOTE — Telephone Encounter (Signed)
He will need to reach out to whomever prescribed this.  We can't comment on no-cardiac medications.

## 2023-08-03 ENCOUNTER — Ambulatory Visit
Admission: EM | Admit: 2023-08-03 | Discharge: 2023-08-03 | Disposition: A | Discharge status: Home / Self Care | Payer: BC Managed Care – PPO | Attending: Emergency Medicine | Admitting: Emergency Medicine

## 2023-08-03 ENCOUNTER — Ambulatory Visit: Payer: BC Managed Care – PPO

## 2023-08-03 DIAGNOSIS — J069 Acute upper respiratory infection, unspecified: Secondary | ICD-10-CM

## 2023-08-03 DIAGNOSIS — I472 Ventricular tachycardia, unspecified: Secondary | ICD-10-CM

## 2023-08-03 LAB — CUP PACEART REMOTE DEVICE CHECK
Battery Remaining Longevity: 106 mo
Battery Voltage: 3.01 V
Brady Statistic RV Percent Paced: 0.27 %
Date Time Interrogation Session: 20241216023324
HighPow Impedance: 55 Ohm
HighPow Impedance: 94 Ohm
Implantable Lead Connection Status: 753985
Implantable Lead Implant Date: 20071018
Implantable Lead Location: 753860
Implantable Lead Model: 6947
Implantable Pulse Generator Implant Date: 20210319
Lead Channel Impedance Value: 304 Ohm
Lead Channel Impedance Value: 361 Ohm
Lead Channel Pacing Threshold Amplitude: 0.75 V
Lead Channel Pacing Threshold Pulse Width: 0.4 ms
Lead Channel Sensing Intrinsic Amplitude: 11.25 mV
Lead Channel Sensing Intrinsic Amplitude: 11.25 mV
Lead Channel Setting Pacing Amplitude: 2.25 V
Lead Channel Setting Pacing Pulse Width: 0.4 ms
Lead Channel Setting Sensing Sensitivity: 1.2 mV
Zone Setting Status: 755011
Zone Setting Status: 755011

## 2023-08-03 LAB — POCT RAPID STREP A (OFFICE): Rapid Strep A Screen: NEGATIVE

## 2023-08-03 MED ORDER — LIDOCAINE VISCOUS HCL 2 % MT SOLN: 15.0000 mL | OROMUCOSAL | 0 refills | Status: DC | PRN

## 2023-08-03 NOTE — ED Triage Notes (Signed)
Patient to Urgent Care with complaints of sore throat and painful swallowing/ runny nose. Denies any known fevers. Symptoms worse at night.   Symptoms started 3-4 days ago.

## 2023-08-03 NOTE — ED Provider Notes (Signed)
Renaldo Fiddler    CSN: [id number] Arrival date & time: 08/03/23  1134      History   Chief Complaint Chief Complaint  Patient presents with   Nasal Congestion   Sore Throat    HPI [name] is a 47 y.o. male.   Patient presents for evaluation of nasal congestion, rhinorrhea, sore throat and a mild nonproductive cough present for 3 to 4 days.  Painful to swallow but able to tolerate food and liquids.  Sore throat worsening at nighttime as pain becomes like sharp razor blades, improves throughout the day.  Has attempted use of cough drops.  Known sick contacts in household.  Denies fever.  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   Asthma when younger   Fatty liver    History of chicken pox    History of kidney stones    MDT DVFB1D1 Visia AF MRI VR  ICD (implantable cardiac defibrillator) in place    Osteopenia    Pancreatitis    Sleep apnea    uses cpap   Ventricular tachycardia (HCC)    sotalol therapy;  catheter ablation at [facility] 10/10 and [facility] 2013    Patient Active Problem List   Diagnosis Date Noted   Superficial venous thrombosis of left upper extremity 07/20/2023   Left forearm pain 07/14/2023   PVC's (premature ventricular contractions) 06/17/2023   Subacute cough 04/15/2023   Post-viral cough syndrome 04/15/2023   Acute pain of right wrist 01/26/2023   Scaphoid fracture of wrist 01/26/2023   Seborrheic keratoses 09/01/2022   Stucco keratoses 09/01/2022   Angioma of skin 09/01/2022   Lentigo 09/01/2022   Fever and chills 09/01/2022   Myalgia 09/01/2022   Nausea and vomiting 06/03/2022   History of hypokalemia 06/03/2022   Generalized abdominal pain 06/03/2022   Right elbow pain 05/02/2022   Fatigue 05/02/2022   Serum calcium elevated 05/02/2022   Paresthesia of left arm  01/23/2022   Localized osteoporosis with current pathological fracture 10/24/2021   Osteopenia 06/18/2021   Genital warts 06/03/2021   Condyloma 06/03/2021   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   Other skin changes due to chronic exposure to nonionizing radiation 06/03/2021   Lumbar radiculopathy 05/14/2021   Stress reaction 03/07/2021   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HCC) 09/14/2019   Kidney stone 06/13/2019   BPH (benign prostatic hyperplasia) 07/27/2018   Elevated glucose level 04/06/2017   GAD (generalized anxiety disorder) 08/16/2016   Routine general medical examination at a health care facility 03/28/2016   Hypertriglyceridemia 02/21/2014   Low HDL (under 40) 02/21/2014   Vitamin D deficiency 02/21/2014   Allergic rhinitis 02/21/2014   Erectile dysfunction 08/19/2013   OSA (obstructive sleep apnea) 10/30/2011   Environmental allergies 10/27/2011   High risk medications (not anticoagulants) long-term use 10/27/2011   Hypokalemia 09/29/2011   Arrhythmogenic RV cardiomyopathy 03/04/2011   Implantable cardioverter-defibrillator (ICD) in situ 01/10/2011   PTSD (post-traumatic stress disorder) 2/2 ICD shocks 01/10/2011   Fatty liver 01/04/2009   GERD 12/05/2008   History of ventricular tachycardia 11/03/2008   ECZEMA 08/17/2007    Past Surgical History:  Procedure Laterality Date   CARDIAC CATHETERIZATION  06/01/2006   CARDIAC DEFIBRILLATOR PLACEMENT  06/03/2006   Medtronic   CHOLECYSTECTOMY N/A 08/01/2021   Procedure: LAPAROSCOPIC CHOLECYSTECTOMY WITH INTRAOPERATIVE CHOLANGIOGRAM;  Surgeon:  Harriette Bouillon, MD;  Location: [facility] ORS;  Service: General;  Laterality: N/A;   HAND SURGERY Left    ICD GENERATOR CHANGEOUT N/A 11/04/2019   Procedure: ICD GENERATOR CHANGEOUT;  Surgeon: [name], MD;  Location: [facility] INVASIVE CV LAB;  Service: Cardiovascular;  Laterality: N/A;   IMPLANTABLE CARDIOVERTER DEFIBRILLATOR GENERATOR CHANGE N/A 12/11/2011   Procedure:  IMPLANTABLE CARDIOVERTER DEFIBRILLATOR GENERATOR CHANGE;  Surgeon: [name], MD;  Location: [facility] CATH LAB;  Service: Cardiovascular;  Laterality: N/A;   KYPHOPLASTY N/A 05/14/2021   Procedure: Lumbar Five Vertebroplasty;  Surgeon: Jadene Pierini, MD;  Location: [facility] OR;  Service: Neurosurgery;  Laterality: N/A;   LUMBAR LAMINECTOMY/ DECOMPRESSION WITH MET-RX N/A 05/14/2021   Procedure: Lumbar Four-Five Minimally Invasive Laminectomy;  Surgeon: Jadene Pierini, MD;  Location: [facility] OR;  Service: Neurosurgery;  Laterality: N/A;  3C/RM 19   TONSILLECTOMY     vt ablation  10/10, 10/13   x 2   WISDOM TOOTH EXTRACTION         Home Medications    Prior to Admission medications   Medication Sig Start Date End Date Taking? Authorizing Provider  lidocaine (XYLOCAINE) 2 % solution Use as directed 15 mLs in the mouth or throat as needed for mouth pain. 08/03/23  Yes Valinda Hoar, NP  alendronate (FOSAMAX) 70 MG tablet Take 70 mg by mouth every Sunday. 02/13/23   [provider]  APIXABAN Everlene Balls) VTE STARTER PACK (10MG  AND 5MG ) Take as directed on package: start with two-5mg  tablets twice daily for 7 days. On day 8, switch to one-5mg  tablet twice daily. 07/15/23   [name], PA-C  Calcium Citrate-Vitamin D (CITRACAL + D PO) Take 1 tablet by mouth 2 (two) times daily.    [provider]  Cholecalciferol (VITAMIN D-3 PO) Take 1 capsule by mouth at bedtime.    [provider]  fexofenadine (ALLEGRA) 180 MG tablet Take 180 mg by mouth daily.    [provider]  flecainide (TAMBOCOR) 100 MG tablet Take 50 mg by mouth 2 (two) times daily. 05/29/23 05/28/24  [provider]  magnesium oxide (MAG-OX) 400 (240 Mg) MG tablet Take 400 mg by mouth 2 (two) times daily.    [provider]  mexiletine (MEXITIL) 150 MG capsule Take 150 mg by mouth 2 (two) times daily. 07/08/23 07/07/24  [provider]  montelukast (SINGULAIR) 10 MG  tablet Take 1 tablet (10 mg total) by mouth at bedtime. 06/05/23   [name], [name], MD  Multiple Vitamin (MULITIVITAMIN WITH MINERALS) TABS Take 1 tablet by mouth daily.    [provider]  nebivolol (BYSTOLIC) 5 MG tablet Take 1 tablet (5 mg total) by mouth daily. Patient taking differently: Take 5 mg by mouth at bedtime. 11/05/22   [name], MD  potassium chloride SA (KLOR-CON M) 20 MEQ tablet Take 40 mEq by mouth daily.    [provider]  sertraline (ZOLOFT) 50 MG tablet Take 50 mg by mouth daily.    [provider]  sotalol (BETAPACE) 120 MG tablet Take 1.5 tablets by mouth 2 (two) times daily. 07/08/23   [provider]  spironolactone (ALDACTONE) 25 MG tablet Take 25 mg by mouth daily. 05/29/23 05/28/24  [provider]  tadalafil (CIALIS) 5 MG tablet Take 5 mg by mouth daily as needed for erectile dysfunction.    [provider]  Zinc 30 MG TABS Take 30 mg by mouth at bedtime.    [provider]    Family History Family History  Problem Relation Age of Onset   Sudden death Father    Asthma Mother    Diabetes Other    Stroke Other    Heart attack Other    Prostate cancer Other    Breast cancer Other    Ovarian cancer Other    Uterine cancer Other    Colon cancer Other    Drug abuse Other    Depression Other    Lung cancer Maternal Grandfather    Heart disease Maternal Grandfather    Heart disease Paternal Grandfather    Hypertension Paternal Grandmother     Social History Social History   Tobacco Use   Smoking status: Former    Current packs/day: 0.00    Average packs/day: 1 pack/day for 18.0 years (18.0 ttl pk-yrs)    Types: Cigarettes    Start date: 01/17/1990    Quit date: 08/19/2007    Years since quitting: 15.9   Smokeless tobacco: Never  Vaping Use   Vaping status: Never Used  Substance Use Topics   Alcohol use: No    Alcohol/week: 0.0 standard drinks of alcohol   Drug use: No      Allergies   Bee venom and Vancomycin   Review of Systems Review of Systems   Physical Exam Triage Vital Signs ED Triage Vitals  Encounter Vitals Group     BP 08/03/23 1200 106/72     Systolic BP Percentile --      Diastolic BP Percentile --      Pulse Rate 08/03/23 1200 (!) 59     Resp 08/03/23 1200 15     Temp 08/03/23 1200 98.3 F (36.8 C)     Temp src --      SpO2 08/03/23 1200 98 %     Weight --      Height --      Head Circumference --      Peak Flow --      Pain Score 08/03/23 1143 3     Pain Loc --      Pain Education --      Exclude from Growth Chart --    No data found.  Updated Vital Signs BP 106/72   Pulse (!) 59   Temp 98.3 F (36.8 C)   Resp 15   SpO2 98%   Visual Acuity Right Eye Distance:   Left Eye Distance:   Bilateral Distance:    Right Eye Near:   Left Eye Near:    Bilateral Near:     Physical Exam Constitutional:      Appearance: Normal appearance. He is well-developed.  HENT:     Right Ear: Tympanic membrane, ear canal and external ear normal.     Left Ear: Tympanic membrane, ear canal and external ear normal.     Nose: Congestion present. No rhinorrhea.     Mouth/Throat:     Pharynx: No oropharyngeal exudate or posterior oropharyngeal erythema.     Tonsils: No tonsillar exudate. 0 on the right. 0 on the left.  Eyes:     Extraocular Movements: Extraocular movements intact.  Cardiovascular:     Rate and Rhythm: Normal rate and regular rhythm.     Pulses: Normal pulses.     Heart sounds: Normal heart sounds.  Pulmonary:     Effort: Pulmonary effort is normal.     Breath sounds: Normal breath sounds.  Neurological:     Mental Status: He  is alert and oriented to person, place, and time. Mental status is at baseline.      UC Treatments / Results  Labs (all labs ordered are listed, but only abnormal results are displayed) Labs Reviewed  POCT RAPID STREP A (OFFICE)    EKG   Radiology No results  found.  Procedures Procedures (including critical care time)  Medications Ordered in UC Medications - No data to display  Initial Impression / Assessment and Plan / UC Course  I have reviewed the triage vital signs and the nursing notes.  Pertinent labs & imaging results that were available during my care of the patient were reviewed by me and considered in my medical decision making (see chart for details).  Viral URI with cough  Patient is in no signs of distress nor toxic appearing.  Vital signs are stable.  Low suspicion for pneumonia, pneumothorax or bronchitis and therefore will defer imaging.  Viscous lidocaine.  Rapid strep test negative.May use additional over-the-counter medications as needed for supportive care.  May follow-up with urgent care as needed if symptoms persist or worsen.   Final Clinical Impressions(s) / UC Diagnoses   Final diagnoses:  Viral URI with cough     Discharge Instructions      Your symptoms today are most likely being caused by a virus and should steadily improve in time it can take up to 7 to 10 days before you truly start to see a turnaround however things will get better  May gargle and spit lidocaine solution every 4 hours as needed to provide temporary relief to your throat    You can take Tylenol 500 to 1000 mg every 6 hours and/or Ibuprofen 600-800  mg every 6-8 hours as needed for fever reduction and pain relief.   For cough: honey 1/2 to 1 teaspoon (you can dilute the honey in water or another fluid).  You can also use guaifenesin and dextromethorphan for cough. You can use a humidifier for chest congestion and cough.  If you don't have a humidifier, you can sit in the bathroom with the hot shower running.      For sore throat: try warm salt water gargles, cepacol lozenges, throat spray, warm tea or water with lemon/honey, popsicles or ice, or OTC cold relief medicine for throat discomfort.   For congestion: take a daily  anti-histamine like Zyrtec, Claritin, and a oral decongestant, such as pseudoephedrine.  You can also use Flonase 1-2 sprays in each nostril daily.   It is important to stay hydrated: drink plenty of fluids (water, gatorade/powerade/pedialyte, juices, or teas) to keep your throat moisturized and help further relieve irritation/discomfort.     ED Prescriptions     Medication Sig Dispense Auth. Provider   lidocaine (XYLOCAINE) 2 % solution Use as directed 15 mLs in the mouth or throat as needed for mouth pain. 100 mL Valinda Hoar, NP      PDMP not reviewed this encounter.   Valinda Hoar, NP 08/03/23 [phone]

## 2023-08-03 NOTE — Discharge Instructions (Signed)
Your symptoms today are most likely being caused by a virus and should steadily improve in time it can take up to 7 to 10 days before you truly start to see a turnaround however things will get better  May gargle and spit lidocaine solution every 4 hours as needed to provide temporary relief to your throat    You can take Tylenol 500 to 1000 mg every 6 hours and/or Ibuprofen 600-800  mg every 6-8 hours as needed for fever reduction and pain relief.   For cough: honey 1/2 to 1 teaspoon (you can dilute the honey in water or another fluid).  You can also use guaifenesin and dextromethorphan for cough. You can use a humidifier for chest congestion and cough.  If you don't have a humidifier, you can sit in the bathroom with the hot shower running.      For sore throat: try warm salt water gargles, cepacol lozenges, throat spray, warm tea or water with lemon/honey, popsicles or ice, or OTC cold relief medicine for throat discomfort.   For congestion: take a daily anti-histamine like Zyrtec, Claritin, and a oral decongestant, such as pseudoephedrine.  You can also use Flonase 1-2 sprays in each nostril daily.   It is important to stay hydrated: drink plenty of fluids (water, gatorade/powerade/pedialyte, juices, or teas) to keep your throat moisturized and help further relieve irritation/discomfort.

## 2023-08-04 ENCOUNTER — Ambulatory Visit: Payer: BC Managed Care – PPO | Admitting: Behavioral Health

## 2023-08-06 ENCOUNTER — Telehealth: Payer: Self-pay

## 2023-08-06 NOTE — Telephone Encounter (Signed)
Copied from CRM [id number]. Topic: Clinical - Medication Question >> Aug 06, 2023  3:17 PM Samuel Jester B wrote: Reason for CRM: Patient stated that he was informed by His PCP to give her a  call regarding medication Eliquis 5MG .

## 2023-08-07 MED ORDER — APIXABAN 5 MG PO TABS: 5.0000 mg | ORAL_TABLET | Freq: Two times a day (BID) | ORAL | 1 refills | Status: DC

## 2023-08-07 NOTE — Telephone Encounter (Signed)
Called and spoke with patient, he states he is due for a refill of Eliquis 5mg  tab and was advised to let our office know.   Last refill 07/15/23 by ER doctor 74 tab LOV:07/21/23

## 2023-08-07 NOTE — Telephone Encounter (Signed)
[facility]

## 2023-08-08 ENCOUNTER — Other Ambulatory Visit: Payer: Self-pay | Admitting: Internal Medicine

## 2023-08-11 ENCOUNTER — Telehealth: Payer: Self-pay

## 2023-08-11 NOTE — Transitions of Care (Post Inpatient/ED Visit) (Signed)
08/11/2023  Name: [name] MRN: [id number] DOB: [birth year]  Today's TOC FU Call Status: Today's TOC FU Call Status:: Successful TOC FU Call Completed TOC FU Call Complete Date: 08/11/23 Patient's Name and Date of Birth confirmed.  Transition Care Management Follow-up Telephone Call Date of Discharge: 08/10/23 Discharge Facility: Other ([facility]) Name of Other ([facility]) Discharge Facility: [facility] Type of Discharge: Inpatient Admission Primary Inpatient Discharge Diagnosis:: Amiodorone change How have you been since you were released from the hospital?: Same Any questions or concerns?: Yes Patient Questions/Concerns:: has a hematoma on his arm from an IV site Patient Questions/Concerns Addressed: Notified Provider of Patient Questions/Concerns  Items Reviewed: Did you receive and understand the discharge instructions provided?: Yes Medications obtained,verified, and reconciled?: Yes (Medications Reviewed) Any new allergies since your discharge?: No Dietary orders reviewed?: Yes Type of Diet Ordered:: Regular Heart Healthy Do you have support at home?: Yes People in Home: spouse Name of Support/Comfort Primary Source: Victorino Dike, spouse  Medications Reviewed Today: Medications Reviewed Today     Reviewed by Redge Gainer, RN (Case Manager) on 08/11/23 at 1041  Med List Status: <None>   Medication Order Taking? Sig Documenting Provider Last Dose Status Informant  alendronate (FOSAMAX) 70 MG tablet [id number]  Take 70 mg by mouth every Sunday. [provider]  Active Self, Pharmacy Records  amiodarone (PACERONE) 100 MG tablet [id number] Yes Take 100 mg by mouth daily. [provider] Taking Active   apixaban (ELIQUIS) 5 MG TABS tablet [id number]  Take 1 tablet (5 mg total) by mouth 2 (two) times daily. [name], [name], MD  Active   Calcium Citrate-Vitamin D (CITRACAL + D PO) [id number]  Take 1 tablet by mouth 2 (two) times daily. [provider]  Active Self, Pharmacy Records  Cholecalciferol (VITAMIN D-3 PO) [id number]  Take 1 capsule by mouth at bedtime. [provider]  Active Self, Pharmacy Records  fexofenadine (ALLEGRA) 180 MG tablet [id number]  Take 180 mg by mouth daily. [provider]  Active Self, Pharmacy Records  flecainide (TAMBOCOR) 100 MG tablet [id number] No Take 50 mg by mouth 2 (two) times daily.  Patient not taking: Reported on 08/11/2023   [provider] Not Taking Active Self, Pharmacy Records  lidocaine (XYLOCAINE) 2 % solution [id number]  Use as directed 15 mLs in the mouth or throat as needed for mouth pain. Valinda Hoar, NP  Active   magnesium oxide (MAG-OX) 400 (240 Mg) MG tablet [id number]  Take 400 mg by mouth 2 (two) times daily. [provider]  Active   mexiletine (MEXITIL) 150 MG capsule [id number]  Take 150 mg by mouth 2 (two) times daily. [provider]  Active   montelukast (SINGULAIR) 10 MG tablet [id number]  Take 1 tablet (10 mg total) by mouth at bedtime. [name], [name], MD  Active Self, Pharmacy Records  Multiple Vitamin [facility] WITH MINERALS) TABS [id number]  Take 1 tablet by mouth daily. [provider]  Active Self, Pharmacy Records  nebivolol (BYSTOLIC) 5 MG tablet [id number]  Take 1 tablet (5 mg total) by mouth daily.  Patient taking differently: Take 5 mg by mouth at bedtime.   [name], MD  Active Self, Pharmacy Records  potassium chloride SA (KLOR-CON M) 20 MEQ tablet [id number]  Take 40 mEq by mouth daily. [provider]  Active   sertraline (ZOLOFT) 50 MG tablet [id number]  Take 50 mg by mouth daily. [provider]  Active   sotalol (BETAPACE) 120  MG tablet [id number] No Take 1.5 tablets by mouth 2 (two) times daily.  Patient not taking: Reported on 08/11/2023   [provider] Not Taking Active   spironolactone (ALDACTONE) 25 MG tablet [id number]  Take 25 mg by mouth daily. [provider]  Active Self, Pharmacy Records  tadalafil (CIALIS) 5 MG tablet [id number]  Take 5 mg by mouth daily as needed for erectile dysfunction. [provider]  Active Self, Pharmacy Records  Zinc 30 MG TABS [id number]  Take 30 mg by mouth at bedtime. [provider]  Active Self, Pharmacy Records            Home Care and Equipment/Supplies: Were Home Health Services Ordered?: NA Any new equipment or medical supplies ordered?: NA  Functional Questionnaire: Do you need assistance with bathing/showering or dressing?: No Do you need assistance with meal preparation?: No Do you need assistance with eating?: No Do you have difficulty maintaining continence: No Do you need assistance with getting out of bed/getting out of a chair/moving?: No Do you have difficulty managing or taking your medications?: No  Follow up appointments reviewed: PCP Follow-up appointment confirmed?: Yes Date of PCP follow-up appointment?: 08/14/23 Follow-up Provider: [facility] Follow-up appointment confirmed?: NA Do you need transportation to your follow-up appointment?: No Do you understand care options if your condition(s) worsen?: Yes-patient verbalized understanding  SDOH Interventions Today    Flowsheet Row Most Recent Value  SDOH Interventions   Food Insecurity Interventions Intervention Not Indicated  Housing Interventions Intervention Not Indicated  Transportation Interventions Intervention Not Indicated  Utilities Interventions Intervention Not Indicated      Interventions Today    Flowsheet Row Most Recent Value  Chronic Disease   Chronic disease during today's visit Other  [Cardiomyopathy]  General Interventions   General Interventions Discussed/Reviewed General Interventions Discussed, Doctor Visits  Doctor Visits Discussed/Reviewed Doctor Visits Discussed  Exercise Interventions   Exercise Discussed/Reviewed Exercise Discussed  Education  Interventions   Education Provided Provided Education  Nutrition Interventions   Nutrition Discussed/Reviewed Nutrition Discussed       TOC Outreach completed today. The patient was at [facility] for a switch to Amiodorone. He had his Sotalol discontinued and Flecainide. The providers decided to take him off of the antiarrythmic's. He is recovering at home. States his is a little tired. Scheduled Hospital follow up with [facility]. He states he has a hematoma in his arm from a discontinued IV site that he is watching. He is sore and a little red. He knows to go to ED or Urgent Care if it gets worse. He is on Eliquis.  The patient has been provided with contact information for the care management team and has been advised to call with any health-related questions or concerns. The patient verbalized understanding with current POC. The patient is directed to their insurance card regarding availability of benefits coverage.    Deidre Ala, RN Medical illustrator [facility] [phone]

## 2023-08-13 ENCOUNTER — Encounter: Payer: Self-pay | Admitting: Internal Medicine

## 2023-08-13 ENCOUNTER — Ambulatory Visit (INDEPENDENT_AMBULATORY_CARE_PROVIDER_SITE_OTHER): Payer: BC Managed Care – PPO | Admitting: Internal Medicine

## 2023-08-13 VITALS — BP 120/80 | HR 59 | Temp 98.4°F | Ht 70.0 in | Wt 189.0 lb

## 2023-08-13 DIAGNOSIS — M7989 Other specified soft tissue disorders: Secondary | ICD-10-CM | POA: Insufficient documentation

## 2023-08-13 DIAGNOSIS — Z1211 Encounter for screening for malignant neoplasm of colon: Secondary | ICD-10-CM | POA: Diagnosis not present

## 2023-08-13 NOTE — Assessment & Plan Note (Signed)
Clearly happened after prolonged bleeding from IV Has apparent tender hematoma proximal to that site (had been holding it upright for a while) Nothing to suggest infection---or venous thrombosis on that arm Reassured that this should resorb over time--can try warm compresses Is on eliquis for the other arm

## 2023-08-13 NOTE — Progress Notes (Signed)
Subjective:    Patient ID: [name], male    DOB: [birth year], 47 y.o.   MRN: [id number]  HPI Here due to redness and swelling in right arm Similar problems with left arm--diagnosed with venous thrombosis in November Recent admissions at [facility] for treatment of recurrent ventricular tachycardia  Was in [facility] from 12/19 to 12/23 Taken off mexilitine, sotalol and flecainide Loaded with amiodarone---finishing 400 tid loading dose  Blood clot in left cephalic vein---started on eliquis Fairly painful but not overly swollen This did resolve  After leaving Due 12/23--two IVs taken out of right arm One didn't stop bleeding easily so had to use a lot of pressure Now with knot in right antecubital fossa---hurts with bending arm Some swelling further down forearm ??warm--but not clearly Swelling now progressing down the arm Pain only by elbow Slight redness  Current Outpatient Medications on File Prior to Visit  Medication Sig Dispense Refill   alendronate (FOSAMAX) 70 MG tablet Take 70 mg by mouth every Sunday.     amiodarone (PACERONE) 100 MG tablet Take 100 mg by mouth daily.     apixaban (ELIQUIS) 5 MG TABS tablet Take 1 tablet (5 mg total) by mouth 2 (two) times daily. 60 tablet 1   Calcium Citrate-Vitamin D (CITRACAL + D PO) Take 1 tablet by mouth 2 (two) times daily.     Cholecalciferol (VITAMIN D-3 PO) Take 1 capsule by mouth at bedtime.     fexofenadine (ALLEGRA) 180 MG tablet Take 180 mg by mouth daily.     flecainide (TAMBOCOR) 100 MG tablet Take 50 mg by mouth 2 (two) times daily.     lidocaine (XYLOCAINE) 2 % solution Use as directed 15 mLs in the mouth or throat as needed for mouth pain. 100 mL 0   magnesium oxide (MAG-OX) 400 (240 Mg) MG tablet Take 400 mg by mouth 2 (two) times daily.     mexiletine (MEXITIL) 150 MG capsule Take 150 mg by mouth 2 (two) times daily.     montelukast (SINGULAIR) 10 MG tablet Take 1 tablet (10 mg total) by mouth at bedtime. 90 tablet 1    Multiple Vitamin (MULITIVITAMIN WITH MINERALS) TABS Take 1 tablet by mouth daily.     nebivolol (BYSTOLIC) 5 MG tablet Take 1 tablet (5 mg total) by mouth daily. (Patient taking differently: Take 5 mg by mouth at bedtime.) 90 tablet 3   potassium chloride SA (KLOR-CON M) 20 MEQ tablet Take 40 mEq by mouth daily.     sertraline (ZOLOFT) 50 MG tablet Take 50 mg by mouth daily.     sotalol (BETAPACE) 120 MG tablet Take 1.5 tablets by mouth 2 (two) times daily.     spironolactone (ALDACTONE) 25 MG tablet Take 25 mg by mouth daily.     tadalafil (CIALIS) 5 MG tablet Take 5 mg by mouth daily as needed for erectile dysfunction.     Zinc 30 MG TABS Take 30 mg by mouth at bedtime.     No current facility-administered medications on file prior to visit.    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  Asthma when younger   Fatty liver    History of chicken pox    History of kidney stones    MDT DVFB1D1 Visia AF MRI VR  ICD (implantable cardiac defibrillator) in place    Osteopenia    Pancreatitis    Sleep apnea    uses cpap   Ventricular tachycardia (HCC)    sotalol therapy;  catheter ablation at [facility] 10/10 and [facility] 2013    Past Surgical History:  Procedure Laterality Date   CARDIAC CATHETERIZATION  06/01/2006   CARDIAC DEFIBRILLATOR PLACEMENT  06/03/2006   Medtronic   CHOLECYSTECTOMY N/A 08/01/2021   Procedure: LAPAROSCOPIC CHOLECYSTECTOMY WITH INTRAOPERATIVE CHOLANGIOGRAM;  Surgeon: Harriette Bouillon, MD;  Location: [facility] ORS;  Service: General;  Laterality: N/A;   HAND SURGERY Left    ICD GENERATOR CHANGEOUT N/A 11/04/2019   Procedure: ICD GENERATOR  CHANGEOUT;  Surgeon: [name], MD;  Location: [facility] INVASIVE CV LAB;  Service: Cardiovascular;  Laterality: N/A;   IMPLANTABLE CARDIOVERTER DEFIBRILLATOR GENERATOR CHANGE N/A 12/11/2011   Procedure: IMPLANTABLE CARDIOVERTER DEFIBRILLATOR GENERATOR CHANGE;  Surgeon: [name], MD;  Location: [facility] CATH LAB;  Service: Cardiovascular;  Laterality: N/A;   KYPHOPLASTY N/A 05/14/2021   Procedure: Lumbar Five Vertebroplasty;  Surgeon: Jadene Pierini, MD;  Location: [facility] OR;  Service: Neurosurgery;  Laterality: N/A;   LUMBAR LAMINECTOMY/ DECOMPRESSION WITH MET-RX N/A 05/14/2021   Procedure: Lumbar Four-Five Minimally Invasive Laminectomy;  Surgeon: Jadene Pierini, MD;  Location: [facility] OR;  Service: Neurosurgery;  Laterality: N/A;  3C/RM 19   TONSILLECTOMY     vt ablation  10/10, 10/13   x 2   WISDOM TOOTH EXTRACTION      Family History  Problem Relation Age of Onset   Sudden death Father    Asthma Mother    Diabetes Other    Stroke Other    Heart attack Other    Prostate cancer Other    Breast cancer Other    Ovarian cancer Other    Uterine cancer Other    Colon cancer Other    Drug abuse Other    Depression Other    Lung cancer Maternal Grandfather    Heart disease Maternal Grandfather    Heart disease Paternal Grandfather    Hypertension Paternal Grandmother     Social History   Socioeconomic History   Marital status: Married    Spouse name: Not on file   Number of children: 0   Years of education: Not on file   Highest education level: Not on file  Occupational History   Occupation: EMT/PARAMEDIC  Tobacco Use   Smoking status: Former    Current packs/day: 0.00    Average packs/day: 1 pack/day for 18.0 years (18.0 ttl pk-yrs)    Types: Cigarettes    Start date: 01/17/1990    Quit date: 08/19/2007    Years since quitting: 15.9   Smokeless tobacco: Never  Vaping Use   Vaping status: Never Used  Substance and Sexual Activity   Alcohol use: No    Alcohol/week: 0.0  standard drinks of alcohol   Drug use: No   Sexual activity: Yes  Other Topics Concern   Not on file  Social History Narrative   Works as a Radiation protection practitioner   Social Drivers of Corporate investment banker Strain: Low Risk  (08/06/2023)   Received from [facility]   Overall Financial Resource Strain (CARDIA)    Difficulty of Paying Living Expenses: Not hard at all  Food Insecurity: No Food Insecurity (08/11/2023)   Hunger Vital Sign    Worried About Running Out of Food in the Last Year: Never true    Ran Out of Food in the Last Year: Never true  Transportation Needs: No Transportation Needs (08/11/2023)   PRAPARE - Administrator, Civil Service (Medical): No    Lack of Transportation (Non-Medical): No  Physical Activity: Not on file  Stress: Not on file  Social Connections: Not on file  Intimate Partner Violence: Not At Risk (08/11/2023)   Humiliation, Afraid, Rape, and Kick questionnaire    Fear of Current or Ex-Partner: No    Emotionally Abused: No    Physically Abused: No    Sexually Abused: No   Review of Systems No fever     Objective:   Physical Exam Constitutional:      Appearance: Normal appearance.  Musculoskeletal:     Comments: Right forearm is swollen No warmth or redness Mass in right antecubital fossa is tender ROM in elbow is normal No cord or palpable abnormality in forearm  Neurological:     Mental Status: He is alert.            Assessment & Plan:

## 2023-08-14 ENCOUNTER — Inpatient Hospital Stay: Payer: BC Managed Care – PPO | Admitting: Family Medicine

## 2023-08-25 ENCOUNTER — Encounter: Payer: Self-pay | Admitting: Internal Medicine

## 2023-08-25 ENCOUNTER — Ambulatory Visit: Payer: 59 | Admitting: Internal Medicine

## 2023-08-25 ENCOUNTER — Telehealth: Payer: Self-pay

## 2023-08-25 VITALS — BP 124/76 | HR 72 | Ht 70.0 in | Wt 190.0 lb

## 2023-08-25 DIAGNOSIS — M8080XD Other osteoporosis with current pathological fracture, unspecified site, subsequent encounter for fracture with routine healing: Secondary | ICD-10-CM | POA: Diagnosis not present

## 2023-08-25 DIAGNOSIS — M8080XS Other osteoporosis with current pathological fracture, unspecified site, sequela: Secondary | ICD-10-CM

## 2023-08-25 NOTE — Patient Instructions (Signed)
 Please stop Fosamax the day prior to taking Reclast and do not restart it

## 2023-08-25 NOTE — Telephone Encounter (Signed)
 Dr. Sam, patient will be scheduled as soon as possible.  Auth Submission: NO AUTH NEEDED Site of care: Site of care: [facility] Payer: [employer] Medication & CPT/J Code(s) submitted: Reclast  (Zolendronic acid) [id number] Route of submission (phone, fax, portal):  Phone # Fax # Auth type: Buy/Bill PB Units/visits requested: 5mg  x 1 dose Reference number:  Approval from: 08/25/23 to 02/15/24
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
## 2023-08-26 LAB — COLOGUARD: COLOGUARD: NEGATIVE

## 2023-08-31 ENCOUNTER — Encounter: Payer: Self-pay | Admitting: Family Medicine

## 2023-08-31 ENCOUNTER — Ambulatory Visit: Payer: 59 | Admitting: *Deleted

## 2023-08-31 VITALS — BP 111/62 | HR 53 | Temp 98.5°F | Resp 16 | Ht 69.0 in | Wt 192.6 lb

## 2023-08-31 DIAGNOSIS — M8080XS Other osteoporosis with current pathological fracture, unspecified site, sequela: Secondary | ICD-10-CM

## 2023-08-31 DIAGNOSIS — R2681 Unsteadiness on feet: Secondary | ICD-10-CM

## 2023-08-31 DIAGNOSIS — R251 Tremor, unspecified: Secondary | ICD-10-CM

## 2023-08-31 DIAGNOSIS — M8080XA Other osteoporosis with current pathological fracture, unspecified site, initial encounter for fracture: Secondary | ICD-10-CM

## 2023-08-31 MED ORDER — ZOLEDRONIC ACID 5 MG/100ML IV SOLN
5.0000 mg | Freq: Once | INTRAVENOUS | Status: AC
Start: 2023-08-31 — End: 2023-08-31
  Administered 2023-08-31: 5 mg via INTRAVENOUS
  Filled 2023-08-31: qty 100

## 2023-08-31 MED ORDER — SODIUM CHLORIDE 0.9 % IV SOLN: INTRAVENOUS | Status: DC

## 2023-08-31 MED ORDER — ACETAMINOPHEN 325 MG PO TABS
650.0000 mg | ORAL_TABLET | Freq: Once | ORAL | Status: DC
Start: 2023-08-31 — End: 2023-08-31
  Filled 2023-08-31: qty 2

## 2023-08-31 MED ORDER — DIPHENHYDRAMINE HCL 25 MG PO CAPS
25.0000 mg | ORAL_CAPSULE | Freq: Once | ORAL | Status: DC
Start: 2023-08-31 — End: 2023-08-31
  Filled 2023-08-31: qty 1

## 2023-08-31 NOTE — Progress Notes (Signed)
 Diagnosis: Osteoporosis  Provider:  Mannam, Praveen MD  Procedure: IV Infusion  IV Type: Peripheral, IV Location:   R. Hand ( infiltrated ) Restarted in R Forearm  Reclast  (Zolendronic Acid), Dose: 5 mg  Infusion Start Time: 1520 pm Restarted @1548  pm  Infusion Stop Time: 1528 pm  due to IV infitration Stopped again @  1615pm  Post Infusion IV Care: Observation period completed and Peripheral IV Discontinued  Discharge: Condition: Good, Destination: Home . AVS Declined  Performed by:  Trudy Lamarr LABOR, RN

## 2023-09-01 ENCOUNTER — Encounter: Payer: Self-pay | Admitting: Internal Medicine

## 2023-09-01 DIAGNOSIS — R251 Tremor, unspecified: Secondary | ICD-10-CM | POA: Insufficient documentation

## 2023-09-01 DIAGNOSIS — R2681 Unsteadiness on feet: Secondary | ICD-10-CM | POA: Insufficient documentation

## 2023-09-02 ENCOUNTER — Ambulatory Visit: Payer: 59

## 2023-09-09 ENCOUNTER — Ambulatory Visit: Payer: 59 | Admitting: Physical Therapy

## 2023-09-09 ENCOUNTER — Encounter: Payer: 59 | Admitting: Occupational Therapy

## 2023-09-09 NOTE — Progress Notes (Signed)
Remote ICD transmission.   

## 2023-09-10 NOTE — Therapy (Incomplete)
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   Asthma when younger   Fatty liver    History of chicken pox    History of kidney stones    MDT DVFB1D1 Visia AF MRI VR  ICD (implantable cardiac defibrillator) in place    Osteopenia    Pancreatitis    Sleep apnea    uses cpap   Ventricular tachycardia (HCC)    sotalol therapy;  catheter ablation at [facility] 10/10 and [facility] 2013   Past Surgical History:  Procedure Laterality Date   CARDIAC CATHETERIZATION  06/01/2006   CARDIAC DEFIBRILLATOR PLACEMENT  06/03/2006   Medtronic   CHOLECYSTECTOMY N/A 08/01/2021   Procedure: LAPAROSCOPIC CHOLECYSTECTOMY WITH INTRAOPERATIVE CHOLANGIOGRAM;  Surgeon: Harriette Bouillon, MD;  Location: [facility];  Service: General;  Laterality: N/A;   HAND SURGERY Left    ICD GENERATOR CHANGEOUT N/A 11/04/2019   Procedure: ICD GENERATOR CHANGEOUT;  Surgeon: [name], MD;  Location: [facility] INVASIVE CV LAB;  Service: Cardiovascular;  Laterality: N/A;   IMPLANTABLE CARDIOVERTER DEFIBRILLATOR GENERATOR CHANGE N/A 12/11/2011   Procedure: IMPLANTABLE CARDIOVERTER DEFIBRILLATOR GENERATOR CHANGE;  Surgeon: [name], MD;  Location: [facility] CATH LAB;  Service: Cardiovascular;  Laterality: N/A;   KYPHOPLASTY N/A 05/14/2021   Procedure: Lumbar Five Vertebroplasty;  Surgeon: Jadene Pierini, MD;  Location: [facility] OR;  Service: Neurosurgery;  Laterality: N/A;   LUMBAR LAMINECTOMY/ DECOMPRESSION WITH MET-RX N/A 05/14/2021   Procedure: Lumbar Four-Five Minimally Invasive Laminectomy;  Surgeon: Jadene Pierini, MD;  Location: [facility]  OR;  Service: Neurosurgery;  Laterality: N/A;  3C/RM 19   TONSILLECTOMY     vt ablation  10/10, 10/13   x 2   WISDOM TOOTH EXTRACTION     Patient Active Problem List   Diagnosis Date Noted   Tremor 09/01/2023   Gait instability 09/01/2023   Arm swelling 08/13/2023   Superficial venous thrombosis of left upper extremity 07/20/2023   Left forearm pain 07/14/2023   PVC's (premature ventricular contractions) 06/17/2023   Subacute cough 04/15/2023   Post-viral cough syndrome 04/15/2023   Acute pain of right wrist 01/26/2023   Scaphoid fracture of wrist 01/26/2023   Seborrheic keratoses 09/01/2022   Stucco keratoses 09/01/2022   Angioma of skin 09/01/2022   Lentigo 09/01/2022   Fever and chills 09/01/2022   Myalgia 09/01/2022   Nausea and vomiting 06/03/2022   History of hypokalemia 06/03/2022   Generalized abdominal pain 06/03/2022   Right elbow pain 05/02/2022   Fatigue 05/02/2022   Serum calcium elevated 05/02/2022   Paresthesia of left arm 01/23/2022   Localized osteoporosis with current pathological fracture 10/24/2021   Osteopenia 06/18/2021   Genital warts 06/03/2021   Condyloma 06/03/2021   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   Other skin changes due to chronic exposure to nonionizing radiation 06/03/2021   Lumbar radiculopathy 05/14/2021   Stress reaction 03/07/2021   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HCC) 09/14/2019   Kidney stone 06/13/2019   BPH (benign prostatic hyperplasia) 07/27/2018   Elevated glucose level 04/06/2017   GAD (generalized anxiety disorder) 08/16/2016   Routine general  medical examination at a health care facility 03/28/2016   Hypertriglyceridemia 02/21/2014   Low HDL (under 40) 02/21/2014   Vitamin D deficiency 02/21/2014   Allergic rhinitis 02/21/2014   Erectile dysfunction 08/19/2013   OSA (obstructive sleep apnea) 10/30/2011   Environmental allergies 10/27/2011   High risk medications (not anticoagulants) long-term use  10/27/2011   Hypokalemia 09/29/2011   Arrhythmogenic RV cardiomyopathy 03/04/2011   Implantable cardioverter-defibrillator (ICD) in situ 01/10/2011   PTSD (post-traumatic stress disorder) 2/2 ICD shocks 01/10/2011   Fatty liver 01/04/2009   GERD 12/05/2008   History of ventricular tachycardia 11/03/2008   ECZEMA 08/17/2007    ONSET DATE: ***  REFERRING DIAG: R25.1 (ICD-10-CM) - Tremor R26.81 (ICD-10-CM) - Gait instability  THERAPY DIAG:  No diagnosis found.  Rationale for Evaluation and Treatment: Rehabilitation  SUBJECTIVE:                                                                                                                                                                                             SUBJECTIVE STATEMENT: *** Pt accompanied by: {accompnied:27141}  PERTINENT HISTORY: ICD, anxiety, cardiomyopathy, asthma, hx  L5 compression fracture s/p kyphoplasty, lumbar laminectomy, osteoporosis   PAIN:  Are you having pain? {OPRCPAIN:27236}  PRECAUTIONS: Other:  ICD, fall, diagnosed with right scaphoid fracture 01/2023  RED FLAGS: {PT Red Flags:29287}   WEIGHT BEARING RESTRICTIONS: {Yes ***/No:24003}  FALLS: Has patient fallen in last 6 months? {fallsyesno:27318}  LIVING ENVIRONMENT: Lives with: {OPRC lives with:25569::"lives with their family"} Lives in: {Lives in:25570} Stairs: {opstairs:27293} Has following equipment at home: {Assistive devices:23999}  PLOF: {PLOF:24004}  PATIENT GOALS: ***  OBJECTIVE:  Note: Objective measures were completed at Evaluation unless otherwise noted.  DIAGNOSTIC FINDINGS: none recent  COGNITION: Overall cognitive status: {cognition:24006}   SENSATION: {sensation:27233}  COORDINATION: Alternating pronation/supination: *** Alternating toe tap: *** Finger to nose: *** Heel to shin: ***   POSTURE: {posture:25561}  LOWER EXTREMITY ROM:     Active  Right Eval Left Eval  Hip flexion    Hip extension     Hip abduction    Hip adduction    Hip internal rotation    Hip external rotation    Knee flexion    Knee extension    Ankle dorsiflexion    Ankle plantarflexion    Ankle inversion    Ankle eversion     (Blank rows = not tested)  LOWER EXTREMITY MMT:    MMT Right Eval Left Eval  Hip flexion    Hip extension    Hip abduction    Hip adduction    Hip internal rotation    Hip  external rotation    Knee flexion    Knee extension    Ankle dorsiflexion    Ankle plantarflexion    Ankle inversion    Ankle eversion    (Blank rows = not tested)  GAIT: Gait pattern: {gait characteristics:25376} Distance walked: *** Assistive device utilized: {Assistive devices:23999} Level of assistance: {Levels of assistance:24026} Comments: ***  FUNCTIONAL TESTS:  {Functional tests:24029}                                                                                                                               TREATMENT DATE: ***    PATIENT EDUCATION: Education details: *** Person educated: {Person educated:25204} Education method: {Education Method:25205} Education comprehension: {Education Comprehension:25206}  HOME EXERCISE PROGRAM: ***   GOALS: Goals reviewed with patient? {yes/no:20286}  SHORT TERM GOALS: Target date: {follow up:25551}  Patient to be independent with initial HEP. Baseline: HEP initiated Goal status: {GOALSTATUS:25110}    LONG TERM GOALS: Target date: {follow up:25551}  Patient to be independent with advanced HEP. Baseline: Not yet initiated  Goal status: {GOALSTATUS:25110}  Patient to demonstrate B LE strength >/=4+/5.  Baseline: See above Goal status: {GOALSTATUS:25110}  Patient to demonstrate *** ROM WFL and without pain limiting.  Baseline: *** Goal status: {GOALSTATUS:25110}  Patient to report and demonstrate improved head, neck, and shoulder posture at rest and with activity.  Baseline: *** Goal status: {GOALSTATUS:25110}  Patient  to demonstrate alternating reciprocal pattern when ascending and descending stairs with good stability and 1 handrail as needed.   Baseline: Unable Goal status: {GOALSTATUS:25110}  Patient to score at least 20/24 on DGI in order to decrease risk of falls.  Baseline: *** Goal status: {GOALSTATUS:25110}  Patient to complete TUG in <14 sec with LRAD in order to decrease risk of falls.   Baseline: *** Goal status: {GOALSTATUS:25110}  Patient to demonstrate 5xSTS test in <15 sec in order to decrease risk of falls.  Baseline: *** Goal status: {GOALSTATUS:25110}  Patient to score at least ***/56 on Berg in order to decrease risk of falls.  Baseline: *** Goal status: {GOALSTATUS:25110}  Patient to score at least *** on FOTO in order to indicate improved functional outcomes.  Baseline: *** Goal status: {GOALSTATUS:25110}  ASSESSMENT:  CLINICAL IMPRESSION:  Patient is a 48 y/o M presenting to OPPT with c/o *** for the past ***  Patient today presenting with ***.    Patient was educated on gentle *** HEP and reported understanding. Prior to current episode, patient was independent. Would benefit from skilled PT services *** x/week for *** weeks to address aforementioned impairments in order to optimize level of function.     OBJECTIVE IMPAIRMENTS: {opptimpairments:25111}.   ACTIVITY LIMITATIONS: {activitylimitations:27494}  PARTICIPATION LIMITATIONS: {participationrestrictions:25113}  PERSONAL FACTORS: {Personal factors:25162} are also affecting patient's functional outcome.   REHAB POTENTIAL: {rehabpotential:25112}  CLINICAL DECISION MAKING: {clinical decision making:25114}  EVALUATION COMPLEXITY: {Evaluation complexity:25115}  PLAN:  PT FREQUENCY: {rehab frequency:25116}  PT DURATION: {rehab duration:25117}  PLANNED INTERVENTIONS: {rehab planned interventions:25118::"97110-Therapeutic exercises","97530- Therapeutic [phone]- Neuromuscular re-education","97535-  Self PPIR","51884- Manual therapy"}  PLAN FOR NEXT SESSION: Isabell Jarvis, PT 09/10/2023, 3:59 PM

## 2023-09-15 ENCOUNTER — Ambulatory Visit: Payer: 59 | Admitting: Occupational Therapy

## 2023-09-15 ENCOUNTER — Ambulatory Visit: Payer: 59 | Admitting: Physical Therapy

## 2023-09-15 ENCOUNTER — Other Ambulatory Visit: Payer: Self-pay

## 2023-09-24 ENCOUNTER — Ambulatory Visit: Payer: BC Managed Care – PPO | Admitting: Internal Medicine

## 2023-09-28 ENCOUNTER — Other Ambulatory Visit: Payer: Self-pay | Admitting: Family Medicine

## 2023-09-28 ENCOUNTER — Encounter: Payer: Self-pay | Admitting: Internal Medicine

## 2023-09-28 ENCOUNTER — Other Ambulatory Visit: Payer: Self-pay | Admitting: Internal Medicine

## 2023-09-29 ENCOUNTER — Encounter: Payer: Self-pay | Admitting: Family Medicine

## 2023-09-29 MED ORDER — OSELTAMIVIR PHOSPHATE 75 MG PO CAPS
75.0000 mg | ORAL_CAPSULE | Freq: Every day | ORAL | 0 refills | Status: DC
Start: 2023-09-29 — End: 2023-09-30

## 2023-09-30 ENCOUNTER — Ambulatory Visit: Payer: 59 | Admitting: Family Medicine

## 2023-09-30 ENCOUNTER — Encounter: Payer: Self-pay | Admitting: Family Medicine

## 2023-09-30 VITALS — BP 112/72 | HR 64 | Temp 98.2°F | Ht 69.0 in | Wt 195.5 lb

## 2023-09-30 DIAGNOSIS — J111 Influenza due to unidentified influenza virus with other respiratory manifestations: Secondary | ICD-10-CM | POA: Diagnosis not present

## 2023-09-30 MED ORDER — BENZONATATE 200 MG PO CAPS: 200.0000 mg | ORAL_CAPSULE | Freq: Two times a day (BID) | ORAL | 0 refills | Status: DC | PRN

## 2023-09-30 MED ORDER — OSELTAMIVIR PHOSPHATE 75 MG PO CAPS: 75.0000 mg | ORAL_CAPSULE | Freq: Two times a day (BID) | ORAL | 0 refills | Status: DC

## 2023-09-30 NOTE — Assessment & Plan Note (Signed)
Respiratory symptoms in household with close exp to flu (2 people) Test at home was neg but that was early /just before symptoms  Pt has cardiac arrhythmia / on amiodarone and limited in terms of meds he can take  Reassuring exam today  Tamiflu 75 mg bid for 5 d  Watch symptoms closely  Tessalon for cough  Tylenol for fever or pain  See AVS for symptom care

## 2023-09-30 NOTE — Progress Notes (Signed)
Subjective:    Patient ID: [name], male    DOB: 05-31-1976, 48 y.o.   MRN: [id number]  HPI  Wt Readings from Last 3 Encounters:  09/30/23 195 lb 8 oz (88.7 kg)  08/31/23 192 lb 9.6 oz (87.4 kg)  08/25/23 190 lb (86.2 kg)   28.87 kg/m  Vitals:   09/30/23 0945  BP: 112/72  Pulse: 64  Temp: 98.2 F (36.8 C)  SpO2: 97%    Pt presents with flu symptoms  Whole family has the flu  (daughter since Monday and now wife)  Now symptomatic   Home test yesterday am - tested neg   Symptoms started yesterday afternoon  Runny nose  ST- mild  Cough -dry cough  Intermittent headache (not sinus)   Some chills No aches   Over the counter  Nothing yet   No n/v/d    Patient Active Problem List   Diagnosis Date Noted   Influenza 09/30/2023   Tremor 09/01/2023   Gait instability 09/01/2023   Arm swelling 08/13/2023   Superficial venous thrombosis of left upper extremity 07/20/2023   Left forearm pain 07/14/2023   PVC's (premature ventricular contractions) 06/17/2023   Subacute cough 04/15/2023   Post-viral cough syndrome 04/15/2023   Acute pain of right wrist 01/26/2023   Scaphoid fracture of wrist 01/26/2023   Seborrheic keratoses 09/01/2022   Stucco keratoses 09/01/2022   Angioma of skin 09/01/2022   Lentigo 09/01/2022   Fever and chills 09/01/2022   Myalgia 09/01/2022   Nausea and vomiting 06/03/2022   History of hypokalemia 06/03/2022   Generalized abdominal pain 06/03/2022   Right elbow pain 05/02/2022   Fatigue 05/02/2022   Serum calcium elevated 05/02/2022   Paresthesia of left arm 01/23/2022   Localized osteoporosis with current pathological fracture 10/24/2021   Osteopenia 06/18/2021   Genital warts 06/03/2021   Condyloma 06/03/2021   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   Other skin changes due to chronic exposure to nonionizing radiation 06/03/2021   Lumbar radiculopathy 05/14/2021   Stress reaction 03/07/2021   Arrhythmogenic right ventricular  cardiomyopathy (HCC) 09/14/2019   Kidney stone 06/13/2019   BPH (benign prostatic hyperplasia) 07/27/2018   Elevated glucose level 04/06/2017   GAD (generalized anxiety disorder) 08/16/2016   Routine general medical examination at a health care facility 03/28/2016   Hypertriglyceridemia 02/21/2014   Low HDL (under 40) 02/21/2014   Vitamin D deficiency 02/21/2014   Allergic rhinitis 02/21/2014   Erectile dysfunction 08/19/2013   OSA (obstructive sleep apnea) 10/30/2011   Environmental allergies 10/27/2011   High risk medications (not anticoagulants) long-term use 10/27/2011   Hypokalemia 09/29/2011   Arrhythmogenic RV cardiomyopathy 03/04/2011   Implantable cardioverter-defibrillator (ICD) in situ 01/10/2011   PTSD (post-traumatic stress disorder) 2/2 ICD shocks 01/10/2011   Fatty liver 01/04/2009   GERD 12/05/2008   History of ventricular tachycardia 11/03/2008   ECZEMA 08/17/2007   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   Asthma when younger   Fatty liver    History of chicken pox    History of kidney stones    MDT DVFB1D1 Visia AF MRI VR  ICD (implantable cardiac defibrillator) in place    Osteopenia    Pancreatitis    Sleep apnea    uses cpap   Ventricular tachycardia (HCC)    sotalol therapy;  catheter ablation at [facility] 10/10 and [facility] 2013   Past Surgical History:  Procedure Laterality Date   CARDIAC CATHETERIZATION  06/01/2006   CARDIAC DEFIBRILLATOR PLACEMENT  06/03/2006   Medtronic   CHOLECYSTECTOMY N/A 08/01/2021   Procedure: LAPAROSCOPIC CHOLECYSTECTOMY WITH INTRAOPERATIVE CHOLANGIOGRAM;  Surgeon: Harriette Bouillon, MD;  Location: [facility] ORS;  Service: General;  Laterality: N/A;   HAND SURGERY Left    ICD GENERATOR CHANGEOUT N/A 11/04/2019   Procedure: ICD GENERATOR CHANGEOUT;  Surgeon: [name], MD;  Location: [facility] INVASIVE CV LAB;  Service: Cardiovascular;  Laterality: N/A;   IMPLANTABLE CARDIOVERTER DEFIBRILLATOR GENERATOR CHANGE N/A 12/11/2011   Procedure: IMPLANTABLE CARDIOVERTER DEFIBRILLATOR GENERATOR CHANGE;  Surgeon: [name], MD;  Location: [facility] CATH LAB;  Service: Cardiovascular;  Laterality: N/A;   KYPHOPLASTY N/A 05/14/2021   Procedure: Lumbar Five Vertebroplasty;  Surgeon: Jadene Pierini, MD;  Location: [facility] OR;  Service: Neurosurgery;  Laterality: N/A;   LUMBAR LAMINECTOMY/ DECOMPRESSION WITH MET-RX N/A 05/14/2021   Procedure: Lumbar Four-Five Minimally Invasive Laminectomy;  Surgeon: Jadene Pierini, MD;  Location: [facility] OR;  Service: Neurosurgery;  Laterality: N/A;  3C/RM 19   TONSILLECTOMY     vt ablation  10/10, 10/13   x 2   WISDOM TOOTH EXTRACTION     Social History   Tobacco Use   Smoking status: Former    Current packs/day: 0.00    Average packs/day: 1 pack/day for 18.0 years (18.0 ttl pk-yrs)    Types: Cigarettes    Start date: 01/17/1990    Quit date: 08/19/2007    Years since quitting: 16.1   Smokeless tobacco: Never  Vaping Use   Vaping status: Never Used  Substance Use Topics   Alcohol use: No    Alcohol/week: 0.0 standard drinks of alcohol   Drug use: No   Family History  Problem Relation Age of Onset   Sudden death Father    Asthma Mother    Diabetes Other    Stroke Other    Heart attack Other    Prostate cancer Other    Breast cancer Other    Ovarian cancer Other    Uterine cancer Other    Colon cancer Other    Drug abuse Other    Depression Other    Lung cancer Maternal Grandfather    Heart disease Maternal Grandfather    Heart disease Paternal Grandfather    Hypertension Paternal Grandmother    Allergies  Allergen Reactions   Bee Venom Anaphylaxis   Vancomycin Itching and Other (See Comments)    Tolerated well after giving benadryl and doubling the infusion time   Current Outpatient Medications on File Prior  to Visit  Medication Sig Dispense Refill   amiodarone (PACERONE) 200 MG tablet Take 100 mg by mouth daily.     apixaban (ELIQUIS) 5 MG TABS tablet Take 1 tablet (5 mg total) by mouth 2 (two) times daily. 60 tablet 1   Calcium Citrate-Vitamin D (CITRACAL + D PO) Take 1 tablet by mouth 2 (two) times daily.     cetirizine (ZYRTEC) 5 MG tablet Take by mouth.     Cholecalciferol (VITAMIN D-3 PO) Take 1 capsule by mouth at bedtime.     clonazePAM (KLONOPIN) 0.5 MG tablet Take by mouth.     fexofenadine (ALLEGRA) 180 MG tablet Take 180 mg by mouth daily.     Magnesium Gluconate 250 MG TABS Take 2 tablets by mouth 2 (two) times daily.     montelukast (SINGULAIR)  10 MG tablet TAKE 1 TABLET BY MOUTH EVERYDAY AT BEDTIME 90 tablet 2   Multiple Vitamin (MULITIVITAMIN WITH MINERALS) TABS Take 1 tablet by mouth daily.     nebivolol (BYSTOLIC) 5 MG tablet TAKE 1 TABLET (5 MG TOTAL) BY MOUTH DAILY. 90 tablet 3   potassium chloride SA (KLOR-CON M) 20 MEQ tablet Take 40 mEq by mouth daily.     sertraline (ZOLOFT) 50 MG tablet Take 50 mg by mouth daily.     spironolactone (ALDACTONE) 25 MG tablet Take 25 mg by mouth daily.     tadalafil (CIALIS) 5 MG tablet Take 5 mg by mouth daily as needed for erectile dysfunction.     Zinc 30 MG TABS Take 30 mg by mouth at bedtime.     No current facility-administered medications on file prior to visit.    Review of Systems  Constitutional:  Positive for fatigue. Negative for appetite change and fever.  HENT:  Positive for congestion, postnasal drip, rhinorrhea, sneezing and sore throat. Negative for ear pain and sinus pressure.   Eyes:  Negative for pain and discharge.  Respiratory:  Positive for cough. Negative for shortness of breath, wheezing and stridor.   Cardiovascular:  Negative for chest pain.  Gastrointestinal:  Negative for diarrhea, nausea and vomiting.  Genitourinary:  Negative for frequency, hematuria and urgency.  Musculoskeletal:  Negative for  arthralgias and myalgias.  Skin:  Negative for rash.  Neurological:  Positive for headaches. Negative for dizziness, weakness and light-headedness.  Psychiatric/Behavioral:  Negative for confusion and dysphoric mood.        Objective:   Physical Exam Constitutional:      General: He is not in acute distress.    Appearance: Normal appearance. He is well-developed and normal weight. He is not ill-appearing, toxic-appearing or diaphoretic.     Comments: Fatigued appearing   HENT:     Head: Normocephalic and atraumatic.     Comments: Nares are injected and congested      Right Ear: Tympanic membrane, ear canal and external ear normal.     Left Ear: Tympanic membrane, ear canal and external ear normal.     Nose: Congestion and rhinorrhea present.     Mouth/Throat:     Mouth: Mucous membranes are moist.     Pharynx: Oropharynx is clear. No oropharyngeal exudate or posterior oropharyngeal erythema.     Comments: Clear pnd  Eyes:     General:        Right eye: No discharge.        Left eye: No discharge.     Conjunctiva/sclera: Conjunctivae normal.     Pupils: Pupils are equal, round, and reactive to light.  Cardiovascular:     Rate and Rhythm: Normal rate.     Heart sounds: Normal heart sounds.  Pulmonary:     Effort: Pulmonary effort is normal. No respiratory distress.     Breath sounds: Normal breath sounds. No stridor. No wheezing, rhonchi or rales.     Comments: Good air exch No wheeze or rhonchi or crackles  Chest:     Chest wall: No tenderness.  Musculoskeletal:     Cervical back: Normal range of motion and neck supple.  Lymphadenopathy:     Cervical: No cervical adenopathy.  Skin:    General: Skin is warm and dry.     Capillary Refill: Capillary refill takes less than 2 seconds.     Findings: No rash.  Neurological:     Mental Status: He  is alert.     Cranial Nerves: No cranial nerve deficit.  Psychiatric:        Mood and Affect: Mood normal.            Assessment & Plan:   Problem List Items Addressed This Visit       Respiratory   Influenza - Primary   Respiratory symptoms in household with close exp to flu (2 people) Test at home was neg but that was early /just before symptoms  Pt has cardiac arrhythmia / on amiodarone and limited in terms of meds he can take  Reassuring exam today  Tamiflu 75 mg bid for 5 d  Watch symptoms closely  Tessalon for cough  Tylenol for fever or pain  See AVS for symptom care        Relevant Medications   oseltamivir (TAMIFLU) 75 MG capsule

## 2023-09-30 NOTE — Patient Instructions (Addendum)
No nsaids due to blood thinner Tylenol is ok    Drink fluids and rest  mucinex is good for cough and congestion  Try tessalon for cough  Nasal saline for congestion as needed  Tylenol for fever or pain or headache  Please alert [place] if symptoms worsen (if severe or short of breath please go to the ER)    Take care of yourseflf   Update if not starting to improve in a week or if worsening

## 2023-10-01 ENCOUNTER — Ambulatory Visit: Payer: 59 | Admitting: Physical Therapy

## 2023-10-01 ENCOUNTER — Ambulatory Visit: Payer: 59 | Admitting: Occupational Therapy

## 2023-10-05 ENCOUNTER — Encounter: Payer: Self-pay | Admitting: Family Medicine

## 2023-10-05 DIAGNOSIS — R251 Tremor, unspecified: Secondary | ICD-10-CM

## 2023-10-06 ENCOUNTER — Ambulatory Visit: Payer: 59 | Admitting: Occupational Therapy

## 2023-10-06 ENCOUNTER — Ambulatory Visit: Payer: 59 | Admitting: Physical Therapy

## 2023-10-08 ENCOUNTER — Encounter: Payer: Self-pay | Admitting: Internal Medicine

## 2023-10-08 ENCOUNTER — Encounter: Payer: Self-pay | Admitting: Family Medicine

## 2023-10-08 ENCOUNTER — Ambulatory Visit: Payer: 59 | Attending: Internal Medicine | Admitting: Internal Medicine

## 2023-10-08 VITALS — BP 90/60 | HR 63 | Ht 70.0 in | Wt 195.5 lb

## 2023-10-08 DIAGNOSIS — I472 Ventricular tachycardia, unspecified: Secondary | ICD-10-CM | POA: Diagnosis not present

## 2023-10-08 LAB — CUP PACEART INCLINIC DEVICE CHECK
Battery Remaining Longevity: 105 mo
Battery Voltage: 3.02 V
Brady Statistic RV Percent Paced: 0.65 %
Date Time Interrogation Session: 20250220200938
HighPow Impedance: 55 Ohm
HighPow Impedance: 90 Ohm
Implantable Lead Connection Status: 753985
Implantable Lead Implant Date: 20071018
Implantable Lead Location: 753860
Implantable Lead Model: 6947
Implantable Pulse Generator Implant Date: 20210319
Lead Channel Impedance Value: 342 Ohm
Lead Channel Impedance Value: 399 Ohm
Lead Channel Pacing Threshold Amplitude: 0.875 V
Lead Channel Pacing Threshold Amplitude: 1 V
Lead Channel Pacing Threshold Pulse Width: 0.4 ms
Lead Channel Pacing Threshold Pulse Width: 0.4 ms
Lead Channel Sensing Intrinsic Amplitude: 8.875 mV
Lead Channel Sensing Intrinsic Amplitude: 9.75 mV
Lead Channel Setting Pacing Amplitude: 2.25 V
Lead Channel Setting Pacing Pulse Width: 0.4 ms
Lead Channel Setting Sensing Sensitivity: 1.2 mV
Zone Setting Status: 755011
Zone Setting Status: 755011

## 2023-10-08 NOTE — Patient Instructions (Signed)
Medication Instructions:  The current medical regimen is effective;  continue present plan and medications.  *If you need a refill on your cardiac medications before your next appointment, please call your pharmacy*   Follow-Up: At [facility], you and your health needs are our priority.  As part of our continuing mission to provide you with exceptional heart care, we have created designated Provider Care Teams.  These Care Teams include your primary Cardiologist (physician) and Advanced Practice Providers (APPs -  Physician Assistants and Nurse Practitioners) who all work together to provide you with the care you need, when you need it.  We recommend signing up for the patient portal called "MyChart".  Sign up information is provided on this After Visit Summary.  MyChart is used to connect with patients for Virtual Visits (Telemedicine).  Patients are able to view lab/test results, encounter notes, upcoming appointments, etc.  Non-urgent messages can be sent to your provider as well.   To learn more about what you can do with MyChart, go to [email or web address].    Your next appointment:   5-6 month(s)  Provider:   Sherryl Manges, MD

## 2023-10-08 NOTE — Progress Notes (Signed)
Patient Care Team: [name], [name], MD as PCP - General (Family Medicine) [name], MD as PCP - Cardiology (Cardiology) [name], MD as PCP - Electrophysiology (Cardiology)   HPI  [name] is a 48 y.o. 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    He is status post catheter ablation [facility] ) and [facility]   Recent increase in PVC burden; event recorder 2/24 about 15% (PVCs plus couplets) 2 morphologies, both within intrinsicoid deflection of about 80 ms.  Some fatigue and brain fog; beta-blocker alternatives were tried 4/24  Hospitalized 7/24 following ICD shock--apparently his endocrinologist had had him decrease his magnesium because of concerns of GI side effects preceding this event by about a week.  Chronic sotalol.  Discussions were had regarding amiodarone deferred given his young age, and augmented beta-blockers given his bradycardia   Hospitalized 10/24 with recurrent ventricular tachycardia.  Flecainide was added to the sotalol and amiodarone  He underwent ablation at [facility] 10/9 done endocardially.  Had episodes of sustained ventricular tachycardia following the procedure with consideration for recurrent epicardial ablation if recurrent ventricular tachycardia  Recurrent ventricular tachycardia 11/24 transferred back to [facility] endocardial/epicardial ablation, limited success.  Postprocedural VT and then recurrent VT not treated 2/2 wavelet causing with holding of therapy.  Presyncopal and palpitations.  A.m. flutters.  Emotionally he remains devastated.  His wrist is recovering; he was diagnosed with a left arm superficial DVT that prompted anticoagulation.  Seen by [facility] heart transplant team 2/25-- decision has just been made to proceed with listing   The patient denies chest pain, shortness of breath, nocturnal dyspnea,  orthopnea or peripheral edema.  There have been no palpitations, lightheadedness or syncope.  Complains of fatigue and emotional lassitude.  Obviously he and his wife have been very anxious about the proceedings and regurgitation to undertake transplant..      Second daughter was born about December 2023.        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   Asthma when younger   Fatty liver    History of chicken pox    History of kidney stones    MDT DVFB1D1 Visia AF MRI VR  ICD (implantable cardiac defibrillator) in place    Osteopenia    Pancreatitis    Sleep apnea    uses cpap   Ventricular tachycardia (HCC)    sotalol therapy;  catheter ablation at [facility] 10/10 and [facility] 2013    Past Surgical History:  Procedure Laterality Date   CARDIAC CATHETERIZATION  06/01/2006   CARDIAC DEFIBRILLATOR PLACEMENT  06/03/2006   Medtronic   CHOLECYSTECTOMY N/A 08/01/2021   Procedure: LAPAROSCOPIC CHOLECYSTECTOMY  WITH INTRAOPERATIVE CHOLANGIOGRAM;  Surgeon: Harriette Bouillon, MD;  Location: [facility] ORS;  Service: General;  Laterality: N/A;   HAND SURGERY Left    ICD GENERATOR CHANGEOUT N/A 11/04/2019   Procedure: ICD GENERATOR CHANGEOUT;  Surgeon: [name], MD;  Location: [facility] INVASIVE CV LAB;  Service: Cardiovascular;  Laterality: N/A;   IMPLANTABLE CARDIOVERTER DEFIBRILLATOR GENERATOR CHANGE N/A 12/11/2011   Procedure: IMPLANTABLE CARDIOVERTER DEFIBRILLATOR GENERATOR CHANGE;  Surgeon: [name], MD;   Location: [facility] CATH LAB;  Service: Cardiovascular;  Laterality: N/A;   KYPHOPLASTY N/A 05/14/2021   Procedure: Lumbar Five Vertebroplasty;  Surgeon: Jadene Pierini, MD;  Location: [facility] OR;  Service: Neurosurgery;  Laterality: N/A;   LUMBAR LAMINECTOMY/ DECOMPRESSION WITH MET-RX N/A 05/14/2021   Procedure: Lumbar Four-Five Minimally Invasive Laminectomy;  Surgeon: Jadene Pierini, MD;  Location: [facility] OR;  Service: Neurosurgery;  Laterality: N/A;  3C/RM 19   TONSILLECTOMY     vt ablation  10/10, 10/13   x 2   WISDOM TOOTH EXTRACTION      Current Meds  Medication Sig   amiodarone (PACERONE) 200 MG tablet Take 100 mg by mouth daily.   apixaban (ELIQUIS) 5 MG TABS tablet Take 1 tablet (5 mg total) by mouth 2 (two) times daily.   Calcium Citrate-Vitamin D (CITRACAL + D PO) Take 1 tablet by mouth 2 (two) times daily.   cetirizine (ZYRTEC) 5 MG tablet Take by mouth.   Cholecalciferol (VITAMIN D-3 PO) Take 1 capsule by mouth at bedtime.   clonazePAM (KLONOPIN) 0.5 MG tablet Take 0.5 mg by mouth 2 (two) times daily.   Magnesium Gluconate 250 MG TABS Take 2 tablets by mouth 2 (two) times daily.   montelukast (SINGULAIR) 10 MG tablet TAKE 1 TABLET BY MOUTH EVERYDAY AT BEDTIME   Multiple Vitamin (MULITIVITAMIN WITH MINERALS) TABS Take 1 tablet by mouth daily.   nebivolol (BYSTOLIC) 5 MG tablet TAKE 1 TABLET (5 MG TOTAL) BY MOUTH DAILY.   potassium chloride SA (KLOR-CON M) 20 MEQ tablet Take 40 mEq by mouth daily.   sertraline (ZOLOFT) 50 MG tablet Take 50 mg by mouth daily.   spironolactone (ALDACTONE) 25 MG tablet Take 25 mg by mouth daily.   tadalafil (CIALIS) 5 MG tablet Take 5 mg by mouth daily as needed for erectile dysfunction.   Zinc 30 MG TABS Take 30 mg by mouth at bedtime.    Allergies  Allergen Reactions   Bee Venom Anaphylaxis   Vancomycin Itching and Other (See Comments)    Tolerated well after giving benadryl and doubling the infusion time      Review of Systems negative  except from HPI and PMH  Physical Exam Ht 5\' 10"  (1.778 m)   Wt 195 lb 8 oz (88.7 kg)   BMI 28.05 kg/m  Well developed and well nourished in no acute distress HENT normal Neck supple with JVP-flat Clear Device pocket well healed; without hematoma or erythema.  There is no tethering  Regular rate and rhythm, no  gallop No/ murmur Abd-soft with active BS No Clubbing cyanosis  edema Skin-warm and dry A & Oriented  Grossly normal sensory and motor function  ECG sinus @ 63 21/11/45  Device function is normal. Programming changes none V sensitivity noted to be 1.2 mV   have reached out to [facility]   See Paceart for details       See Paceart for details    CrCl cannot be calculated (Patient's most recent lab result is older than the  maximum 21 days allowed.).   Assessment and  Plan ARVC-TMEM gene Positive   VT-recurrent status post ablation at [facility]   ICD   Medtronic    Sinus bradycardia  PVCs-frequent   High Risk Medication Surveillance-sotalol   PTSD     Fatigue  Upper extremity DVT  AFib false detections on device   Left bundle branch block-trransient    Ventricular tachycardia is quiet. Continue with amiodarone; flecainide and sotalol have been discontinued at [facility] Received a call today that he has been listed.  We will begin to drift into the background.  Will plan to see him in about 6 months and he is advised to let us know if there is anything we can do to help.  With the ventricular sensitivity programmed at 1.2 mV I have reached out to [facility] as to whether they want to reprogram this.  He remains on anticoagulation for his DVT.

## 2023-10-14 ENCOUNTER — Other Ambulatory Visit: Payer: Self-pay

## 2023-10-14 ENCOUNTER — Encounter: Payer: Self-pay | Admitting: Physical Therapy

## 2023-10-14 ENCOUNTER — Ambulatory Visit: Payer: 59 | Admitting: Physical Therapy

## 2023-10-14 ENCOUNTER — Ambulatory Visit: Payer: 59 | Attending: Family Medicine | Admitting: Occupational Therapy

## 2023-10-14 VITALS — BP 117/83 | HR 54

## 2023-10-14 DIAGNOSIS — R2689 Other abnormalities of gait and mobility: Secondary | ICD-10-CM | POA: Insufficient documentation

## 2023-10-14 DIAGNOSIS — R278 Other lack of coordination: Secondary | ICD-10-CM | POA: Diagnosis present

## 2023-10-14 NOTE — Therapy (Signed)
 OUTPATIENT OCCUPATIONAL THERAPY NEURO EVALUATION  Patient Name: [name] MRN: [id number] DOB:July 24, 1976, 48 y.o., male Today's Date: 10/14/2023  PCP: Judy Pimple, MD  REFERRING PROVIDER: Judy Pimple, MD   END OF SESSION:  OT End of Session - 10/14/23 1636     Visit Number 1    Number of Visits 5   including eval   Date for OT Re-Evaluation 11/13/23    Authorization Type [facility], Berkley Harvey not required    OT Start Time 1540    OT Stop Time 1618    OT Time Calculation (min) 38 min    Activity Tolerance Patient tolerated treatment well    Behavior During Therapy WFL for tasks assessed/performed             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   Asthma when younger   Fatty liver    History of chicken pox    History of kidney stones    MDT DVFB1D1 Visia AF MRI VR  ICD (implantable cardiac defibrillator) in place    Osteopenia    Pancreatitis    Sleep apnea    uses cpap   Ventricular tachycardia (HCC)    sotalol therapy;  catheter ablation at [facility] 10/10 and [facility] 2013   Past Surgical History:  Procedure Laterality Date   CARDIAC CATHETERIZATION  06/01/2006   CARDIAC DEFIBRILLATOR PLACEMENT  06/03/2006   [employer]   CHOLECYSTECTOMY N/A 08/01/2021   Procedure: LAPAROSCOPIC CHOLECYSTECTOMY WITH INTRAOPERATIVE CHOLANGIOGRAM;  Surgeon: Harriette Bouillon, MD;  Location: [facility] ORS;  Service: General;  Laterality: N/A;   HAND SURGERY Left    ICD GENERATOR CHANGEOUT N/A 11/04/2019   Procedure: ICD GENERATOR CHANGEOUT;  Surgeon: [name], MD;  Location: [facility] INVASIVE CV LAB;  Service: Cardiovascular;  Laterality: N/A;   IMPLANTABLE CARDIOVERTER DEFIBRILLATOR GENERATOR CHANGE N/A 12/11/2011   Procedure: IMPLANTABLE CARDIOVERTER DEFIBRILLATOR GENERATOR CHANGE;  Surgeon: [name], MD;   Location: [facility] CATH LAB;  Service: Cardiovascular;  Laterality: N/A;   KYPHOPLASTY N/A 05/14/2021   Procedure: Lumbar Five Vertebroplasty;  Surgeon: Jadene Pierini, MD;  Location: [facility] OR;  Service: Neurosurgery;  Laterality: N/A;   LUMBAR LAMINECTOMY/ DECOMPRESSION WITH MET-RX N/A 05/14/2021   Procedure: Lumbar Four-Five Minimally Invasive Laminectomy;  Surgeon: Jadene Pierini, MD;  Location: [facility] OR;  Service: Neurosurgery;  Laterality: N/A;  3C/RM 19   TONSILLECTOMY     vt ablation  10/10, 10/13   x 2   WISDOM TOOTH EXTRACTION     Patient Active Problem List   Diagnosis Date Noted   Influenza 09/30/2023   Tremor 09/01/2023   Gait instability 09/01/2023   Arm swelling 08/13/2023   Superficial venous thrombosis of left upper extremity 07/20/2023   Left forearm pain 07/14/2023   PVC's (premature ventricular contractions) 06/17/2023   Subacute cough 04/15/2023   Post-viral cough syndrome 04/15/2023   Acute pain of right wrist 01/26/2023   Scaphoid fracture of wrist 01/26/2023   Seborrheic keratoses 09/01/2022   Stucco keratoses 09/01/2022   Angioma of skin 09/01/2022   Lentigo 09/01/2022   Fever and chills 09/01/2022   Myalgia 09/01/2022   Nausea and vomiting 06/03/2022   History of hypokalemia 06/03/2022   Generalized abdominal pain 06/03/2022   Right elbow pain 05/02/2022   Fatigue 05/02/2022  Serum calcium elevated 05/02/2022   Paresthesia of left arm 01/23/2022   Localized osteoporosis with current pathological fracture 10/24/2021   Osteopenia 06/18/2021   Genital warts 06/03/2021   Condyloma 06/03/2021   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   Other skin changes due to chronic exposure to nonionizing radiation 06/03/2021   Lumbar radiculopathy 05/14/2021   Stress reaction 03/07/2021   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HCC) 09/14/2019   Kidney stone 06/13/2019   BPH (benign prostatic hyperplasia) 07/27/2018   Elevated glucose level 04/06/2017   GAD  (generalized anxiety disorder) 08/16/2016   Routine general medical examination at a health care facility 03/28/2016   Hypertriglyceridemia 02/21/2014   Low HDL (under 40) 02/21/2014   Vitamin D deficiency 02/21/2014   Allergic rhinitis 02/21/2014   Erectile dysfunction 08/19/2013   OSA (obstructive sleep apnea) 10/30/2011   Environmental allergies 10/27/2011   High risk medications (not anticoagulants) long-term use 10/27/2011   Hypokalemia 09/29/2011   Arrhythmogenic RV cardiomyopathy 03/04/2011   Implantable cardioverter-defibrillator (ICD) in situ 01/10/2011   PTSD (post-traumatic stress disorder) 2/2 ICD shocks 01/10/2011   Fatty liver 01/04/2009   GERD 12/05/2008   History of ventricular tachycardia 11/03/2008   ECZEMA 08/17/2007    ONSET DATE: 10/06/23 (referral date)  REFERRING DIAG: R25.1 (ICD-10-CM) - Tremor, unspecified   THERAPY DIAG:  Other lack of coordination  Rationale for Evaluation and Treatment: Rehabilitation  SUBJECTIVE:   SUBJECTIVE STATEMENT: Pt reported being on heart transplant list. Pt reported "I'm not sure why I'm here." Pt reported taking medications. Pt reported developing tremors in hand and here to monitor tremors. Pt reported difficulty with tremors when feeding daughter with forward reach and difficulty using phone d/t "fingers twitch." Pt reported difficulty with FM coordination "my hands were usually steady."   Pt accompanied by: self  PERTINENT HISTORY: Per 10/08/23 MD Progress notes: ARVC-TMEM gene Positive, VT-recurrent status post ablation at [facility], ICD, Sinus bradycardia, PVCs-frequent, High Risk Medication Surveillance, PTSD, anxiety, fatigue, Upper extremity DVT, AFib false detections on device, Left bundle branch block-transient  PRECAUTIONS: ICD implantation 3/21, Fall, driving restrictions, pt reported avoiding running and only power walk - pt reported medical providers have said it's okay to get heart rate up though pt reported feeling  uncomfortable.  WEIGHT BEARING RESTRICTIONS: No  PAIN:  Are you having pain? Chronic back pain: 2-3/10 (per pt: L4-L5 compression fx and subsequent surgery approx. 3 years ago)  FALLS: Has patient fallen in last 6 months? No  LIVING ENVIRONMENT: Lives with: lives with their family Lives in: House/apartment Stairs: No Has following equipment at home: None  PLOF: Independent with basic ADLs, driving, increased time for tasks  PATIENT GOALS: Pt unsure of goal, reported participating in OT eval at request of EP to monitor for objective data on tremors secondary to medications.  OBJECTIVE:  Note: Objective measures were completed at Evaluation unless otherwise noted.  HAND DOMINANCE: Right  ADLs: Overall ADLs: ind Transfers/ambulation related to ADLs: Eating: ind Grooming: ind UB Dressing: ind, mild difficulty with buttons d/t tremors, no difficulty with zippers LB Dressing: ind, no difficulty with buttons, typically slides on shoes Toileting: ind Bathing: ind Tub Shower transfers: ind, no difficulty Equipment:  tub/shower  IADLs: Shopping: dependent on spouse d/t driving restrictions, no difficulty when walking in supermarket when accompanying spouse with extra time Light housekeeping: ind Meal Prep: super careful when slicing fruit though ind Community mobility: currently on driving restrictions Medication management: ind  Handwriting:  Pt reported "a little messier."  Pt reported handwriting has  never been super consistent. Pt reported no concerns when handwriting.  IADL - Pt reported difficulty with tremors when feeding daughter with forward reach and difficulty using phone d/t "fingers twitch." Pt reported difficulty with FM coordination "my hands were usually steady."   Texting - difficulty, hits wrong keys Typing on keyboard - some difficulty  MOBILITY STATUS: Independent  POSTURE COMMENTS:  ind  ACTIVITY TOLERANCE: Activity tolerance: feeling winded if  walking too quickly for more than 3-5 minutes  FUNCTIONAL OUTCOME MEASURES: PSFS: 6.5    Total score = sum of the activity scores/number of activities Minimum detectable change (90%CI) for average score = 2 points Minimum detectable change (90%CI) for single activity score = 3 points   UPPER EXTREMITY ROM:    Active ROM Right eval Left eval  Shoulder flexion [facility]  Shoulder abduction    Shoulder adduction    Shoulder extension    Shoulder internal rotation    Shoulder external rotation    Elbow flexion    Elbow extension    Wrist flexion    Wrist extension    Wrist ulnar deviation    Wrist radial deviation    Wrist pronation    Wrist supination    (Blank rows = not tested)  WFL BUE composite digit flex  Pt reported "tightness" of B shoulders.  Pt reported scaphoid fx of R wrist in June 2024 with medical f/u. No limitations of ROM noted of R wrist.  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  UPPER EXTREMITY MMT:     MMT Right eval Left eval  Shoulder flexion 4+ 4+  Shoulder abduction    Shoulder adduction    Shoulder extension    Shoulder internal rotation    Shoulder external rotation    Middle trapezius    Lower trapezius    Elbow flexion    Elbow extension    Wrist flexion    Wrist extension    Wrist ulnar deviation    Wrist radial deviation    Wrist pronation    Wrist supination    (Blank rows = not tested)  HAND FUNCTION: Grip strength: Right: 104 lbs; Left: 105 lbs  COORDINATION: 9 Hole Peg test: Right: 29 sec; Left: 29 sec.   Pt reported movements felt slower. Pt reported dropping phone sometimes.  SENSATION: Pt denied numbness/tingling  EDEMA: none noted  MUSCLE TONE: RUE: Within functional limits and LUE: Within functional limits  COGNITION: Overall cognitive status: Within functional limits for tasks assessed  VISION: Not tested  PERCEPTION: Not tested  PRAXIS: Not tested  OBSERVATIONS: Pt was  pleasant and ambulated ind without A/E. Pt demo'd very mild to no visible tremors though reported difficulty with coordination during functional daily tasks. Pt demo'd slower and seemingly cautious movements during FM tasks. Pt demo'd low HR when seated at rest though pt reported HR was typical.  TREATMENT DATE:    Self-Care Vitals assessed, see vital signs. HR noted to be low and therefore monitored throughout eval. Pt reported low HR is typical with HR commonly in "40s and 50s" and pt reported HR as low as 35 when sleeping. Pt reported some heart palpations today though "nothing different than my usual" and otherwise asymptomatic.   OT educated pt on BP and HR parameters, OT role, POC. Pt acknowledged understanding.    PATIENT EDUCATION: Education details: see today's tx above. Person educated: Patient Education method: Explanation Education comprehension: verbalized understanding  HOME EXERCISE PROGRAM: TBD   GOALS: Goals reviewed with patient? Yes  SHORT TERM GOALS: Target date: 11/13/23  Pt will be ind with initial HEP using visual handouts Baseline: new to outpt OT Goal status: INITIAL  2.  Patient will report at least two-point increase in average PSFS score or at least three-point increase in a single activity score indicating functionally significant improvement given minimum detectable change with consideration of A/E and tremor reduction strategies PRN. Baseline: 6.5 total score (See above for individual activity scores)  Goal status: INITIAL  3.  Pt will recall at least 3 tremor reduction strategies. Baseline: new to outpt OT, pt reported difficulty with feeding daughter, holding and navigating phone, and typing tasks secondary to decreased coordination. Goal status: INITIAL  4.  Patient will demo improved FM coordination as evidenced by  completing nine-hole peg with use of BUE in 27 seconds or less.  Baseline: 9 Hole Peg test: Right: 29 sec; Left: 29 sec. Pt reported movements felt slower. Pt reported dropping phone sometimes. Goal status: INITIAL   ASSESSMENT:  CLINICAL IMPRESSION: Patient is a 48 y.o. male who was seen today for occupational therapy evaluation for tremors. Hx includes complex medical hx, including but not limited to ARVC-TMEM gene Positive, VT-recurrent status post ablation at [facility], ICD, Sinus bradycardia, PVCs-frequent, High Risk Medication Surveillance, PTSD, anxiety, fatigue, Upper extremity DVT, AFib false detections on device, Left bundle branch block-transient. Patient currently presents below baseline level of functioning demonstrating functional deficits and impairments as noted below. Pt would benefit from skilled OT services in the outpatient setting to work on impairments as noted below.  PERFORMANCE DEFICITS: in functional skills including ADLs, IADLs, coordination, dexterity, Fine motor control, Gross motor control, body mechanics, endurance, cardiopulmonary status limiting function, and UE functional use, cognitive skills including  none , and psychosocial skills including environmental adaptation.   IMPAIRMENTS: are limiting patient from ADLs, IADLs, work, leisure, and social participation.   CO-MORBIDITIES: has co-morbidities such as complex medical hx (see pertinent hx above)  that affects occupational performance. Patient will benefit from skilled OT to address above impairments and improve overall function.  MODIFICATION OR ASSISTANCE TO COMPLETE EVALUATION: Min-Moderate modification of tasks or assist with assess necessary to complete an evaluation.  OT OCCUPATIONAL PROFILE AND HISTORY: Detailed assessment: Review of records and additional review of physical, cognitive, psychosocial history related to current functional performance.  CLINICAL DECISION MAKING: Moderate - several treatment  options, min-mod task modification necessary  REHAB POTENTIAL: Good  EVALUATION COMPLEXITY: Moderate    PLAN:  OT FREQUENCY: 1-2x/week (per pt, requested 1x per week d/t transportation concerns)  OT DURATION: 4 weeks  PLANNED INTERVENTIONS: 97168 OT Re-evaluation, 97535 self care/ADL training, 16109 therapeutic exercise, 97530 therapeutic activity, 97112 neuromuscular re-education, 97140 manual therapy, 97035 ultrasound, 97018 paraffin, 60454 fluidotherapy, 97010 moist heat, 97010 cryotherapy, passive range of motion, functional mobility training, energy conservation, patient/family education, and DME and/or AE instructions  RECOMMENDED OTHER SERVICES: PT eval completed  CONSULTED AND AGREED WITH PLAN OF CARE: Patient  PLAN FOR NEXT SESSION:   Monitor vitals PRN, esp HR Initiate FM coordination HEP Tremor reduction strategies - handout A/E and adaptive strategies for feeding daughter with functional forward reach, holding phone, typing on keyboard, texting on phone   Wynetta Emery, OT 10/14/2023, 4:49 PM

## 2023-10-14 NOTE — Therapy (Signed)
 OUTPATIENT PHYSICAL THERAPY NEURO EVALUATION   Patient Name: [name] MRN: [id number] DOB:[birth year], 48 y.o., male Today's Date: 10/15/2023   PCP: Judy Pimple, MD  REFERRING PROVIDER: Judy Pimple, MD   END OF SESSION:  PT End of Session - 10/15/23 2103     Visit Number 1    Number of Visits 1    Authorization Type [employer]    PT Start Time 1624    PT Stop Time 1658    PT Time Calculation (min) 34 min    Activity Tolerance Patient tolerated treatment well   HR 50s (pt reports this is baseline)   Behavior During Therapy WFL for tasks assessed/performed             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   Asthma when younger   Fatty liver    History of chicken pox    History of kidney stones    MDT DVFB1D1 Visia AF MRI VR  ICD (implantable cardiac defibrillator) in place    Osteopenia    Pancreatitis    Sleep apnea    uses cpap   Ventricular tachycardia (HCC)    sotalol therapy;  catheter ablation at [facility] 10/10 and [facility] 2013   Past Surgical History:  Procedure Laterality Date   CARDIAC CATHETERIZATION  06/01/2006   CARDIAC DEFIBRILLATOR PLACEMENT  06/03/2006   [employer]   CHOLECYSTECTOMY N/A 08/01/2021   Procedure: LAPAROSCOPIC CHOLECYSTECTOMY WITH INTRAOPERATIVE CHOLANGIOGRAM;  Surgeon: Harriette Bouillon, MD;  Location: [facility] ORS;  Service: General;  Laterality: N/A;   HAND SURGERY Left    ICD GENERATOR CHANGEOUT N/A 11/04/2019   Procedure: ICD GENERATOR CHANGEOUT;  Surgeon: [name], MD;  [facility] INVASIVE CV LAB;  Service: Cardiovascular;  Laterality: N/A;   IMPLANTABLE CARDIOVERTER DEFIBRILLATOR GENERATOR CHANGE N/A 12/11/2011   Procedure: IMPLANTABLE CARDIOVERTER DEFIBRILLATOR GENERATOR CHANGE;  Surgeon: [name], MD;  Location: [facility] CATH LAB;  Service: Cardiovascular;   Laterality: N/A;   KYPHOPLASTY N/A 05/14/2021   Procedure: Lumbar Five Vertebroplasty;  Surgeon: Jadene Pierini, MD;  Location: [facility] OR;  Service: Neurosurgery;  Laterality: N/A;   LUMBAR LAMINECTOMY/ DECOMPRESSION WITH MET-RX N/A 05/14/2021   Procedure: Lumbar Four-Five Minimally Invasive Laminectomy;  Surgeon: Jadene Pierini, MD;  Location: [facility] OR;  Service: Neurosurgery;  Laterality: N/A;  3C/RM 19   TONSILLECTOMY     vt ablation  10/10, 10/13   x 2   WISDOM TOOTH EXTRACTION     Patient Active Problem List   Diagnosis Date Noted   Influenza 09/30/2023   Tremor 09/01/2023   Gait instability 09/01/2023   Arm swelling 08/13/2023   Superficial venous thrombosis of left upper extremity 07/20/2023   Left forearm pain 07/14/2023   PVC's (premature ventricular contractions) 06/17/2023   Subacute cough 04/15/2023   Post-viral cough syndrome 04/15/2023   Acute pain of right wrist 01/26/2023   Scaphoid fracture of wrist 01/26/2023   Seborrheic keratoses 09/01/2022   Stucco keratoses 09/01/2022   Angioma of skin 09/01/2022   Lentigo 09/01/2022   Fever and chills 09/01/2022   Myalgia 09/01/2022   Nausea and vomiting 06/03/2022   History of hypokalemia 06/03/2022   Generalized abdominal pain 06/03/2022   Right elbow pain 05/02/2022   Fatigue 05/02/2022   Serum calcium elevated 05/02/2022  Paresthesia of left arm 01/23/2022   Localized osteoporosis with current pathological fracture 10/24/2021   Osteopenia 06/18/2021   Genital warts 06/03/2021   Condyloma 06/03/2021   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   Other skin changes due to chronic exposure to nonionizing radiation 06/03/2021   Lumbar radiculopathy 05/14/2021   Stress reaction 03/07/2021   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HCC) 09/14/2019   Kidney stone 06/13/2019   BPH (benign prostatic hyperplasia) 07/27/2018   Elevated glucose level 04/06/2017   GAD (generalized anxiety disorder) 08/16/2016   Routine  general medical examination at a health care facility 03/28/2016   Hypertriglyceridemia 02/21/2014   Low HDL (under 40) 02/21/2014   Vitamin D deficiency 02/21/2014   Allergic rhinitis 02/21/2014   Erectile dysfunction 08/19/2013   OSA (obstructive sleep apnea) 10/30/2011   Environmental allergies 10/27/2011   High risk medications (not anticoagulants) long-term use 10/27/2011   Hypokalemia 09/29/2011   Arrhythmogenic RV cardiomyopathy 03/04/2011   Implantable cardioverter-defibrillator (ICD) in situ 01/10/2011   PTSD (post-traumatic stress disorder) 2/2 ICD shocks 01/10/2011   Fatty liver 01/04/2009   GERD 12/05/2008   History of ventricular tachycardia 11/03/2008   ECZEMA 08/17/2007    ONSET DATE: 10/05/2023 (MD referral)  REFERRING DIAG: R25.1 (ICD-10-CM) - Tremor R26.81 (ICD-10-CM) - Gait instability  THERAPY DIAG:  Other abnormalities of gait and mobility  Rationale for Evaluation and Treatment: Rehabilitation  SUBJECTIVE:                                                                                                                                                                                             SUBJECTIVE STATEMENT: Not sure why I'm here for physical therapy.  Have had some tremors in my hands due to medication.  Have had 3 procedures since October, so maybe not as strong as my baseline.  On the heart transplant list and feel like I'm getting back to my normal. Pt accompanied by: self  PERTINENT HISTORY: ICD, anxiety, cardiomyopathy, asthma, hx  L5 compression fracture s/p kyphoplasty, lumbar laminectomy, osteopenia, DVT (on anticoagulants) Per MD note:  Seen in followup for ventricular tachycardia in the setting of gene positive arrhythmogenic cardiomyopathy status post ICD implantation with gen change 3/21.    He is status post catheter ablation [facility] ) and [facility]  PAIN:  Are you having pain? Yes: NPRS scale: 4-5/10 Pain location: low  back Pain description: achy Aggravating factors: bending, lifting Relieving factors: goes away in few minutes  PRECAUTIONS: Other:  ICD, fall, diagnosed with right scaphoid fracture 01/2023  RED FLAGS: None   WEIGHT BEARING RESTRICTIONS: No  FALLS: Has patient fallen in last  6 months? 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  OBJECTIVE:  Note: Objective measures were completed at Evaluation unless otherwise noted.  DIAGNOSTIC FINDINGS: none recent  COGNITION: Overall cognitive status: Within functional limits for tasks assessed   SENSATION: Light touch: WFL   POSTURE: No Significant postural limitations  LOWER EXTREMITY ROM:   WFL  Active  Right Eval Left Eval  Hip flexion    Hip extension    Hip abduction    Hip adduction    Hip internal rotation    Hip external rotation    Knee flexion    Knee extension    Ankle dorsiflexion    Ankle plantarflexion    Ankle inversion    Ankle eversion     (Blank rows = not tested)  LOWER EXTREMITY MMT:  grossly tested 4+/5  MMT Right Eval Left Eval  Hip flexion    Hip extension    Hip abduction    Hip adduction    Hip internal rotation    Hip external rotation    Knee flexion    Knee extension    Ankle dorsiflexion    Ankle plantarflexion    Ankle inversion    Ankle eversion    (Blank rows = not tested)  VITALS:  129/77 HR 53 bpm (Pt reports this is baseline)  GAIT: Gait pattern:  slowed and step through pattern Distance walked: 50 ft x 2 Assistive device utilized: None Level of assistance: Complete Independence Comments: Slowed gait pattern and  pt reports this is his baseline  FUNCTIONAL TESTS:  10 meter walk test: 11.72 sec (2.8 ft/sec) Dynamic Gait Index: 19/24 SLS: 10 sec R and LLE Tandem stance:  30 sec each foot position M-CTSIB  Condition 1: Firm Surface, EO 30 Sec, Normal Sway  Condition 2: Firm Surface, EC 30 Sec, Normal Sway  Condition 3: Foam Surface, EO 30 Sec, Normal Sway  Condition 4: Foam Surface, EC 30 Sec, Mild Sway     [facility] PT Assessment - 10/14/23 1647       Standardized Balance Assessment   Standardized Balance Assessment Dynamic Gait Index      Dynamic Gait Index   Level Surface Normal    Change in Gait Speed Mild Impairment    Gait with Horizontal Head Turns Mild Impairment    Gait with Vertical Head Turns Normal    Gait and Pivot Turn Mild Impairment    Step Over Obstacle Mild Impairment    Step Around Obstacles Mild Impairment    Steps Normal    Total Score 19  TREATMENT DATE: 10/14/2023    PATIENT EDUCATION: Education details: PT eval results; no further PT indicated at this time Person educated: Patient Education method: Explanation Education comprehension: verbalized understanding  HOME EXERCISE PROGRAM: NA   GOALS:  NA-Eval only  ASSESSMENT:  CLINICAL IMPRESSION:  Patient is a 48 y/o M presenting to O[facility] with dx of tremors.  He has extensive cardiac history and is awaiting cardiac transplant.  He was referred to OPPT and complains of tremor, mainly affecting Ues and fine motor activities; OT evaluated pt and will address.  At baseline, pt reports walking slowly and deliberately, to avoid excess elevation of HR with activity.  With MMT and balance testing today, pt demo good strength and is not at increased risk of falls.  Scores on 19/24 on DGI mostly indicate pt's slowed, deliberate pace, which is his baseline.  He reports gradually adding  physical activity into his routine and feels he is nearly back to his functional mobility baseline.  He does not appear to have any skilled PT needs at this time.    CLINICAL DECISION MAKING: Stable/uncomplicated  EVALUATION COMPLEXITY: Low  PLAN:  PT FREQUENCY: one time visit  PT DURATION: other: eval only  PLANNED INTERVENTIONS: Eval only  PLAN FOR NEXT SESSION: NA-pt does not appear to have skilled PT needs at this time.   Gean Maidens., PT 10/15/2023, 9:04 PM  [facility] [address] [place], [address] [place], [place] [address] Phone # [phone] Fax # [phone]

## 2023-10-21 ENCOUNTER — Other Ambulatory Visit: Payer: Self-pay

## 2023-10-21 ENCOUNTER — Emergency Department (HOSPITAL_COMMUNITY)

## 2023-10-21 ENCOUNTER — Encounter (HOSPITAL_COMMUNITY): Payer: Self-pay

## 2023-10-21 ENCOUNTER — Emergency Department (HOSPITAL_COMMUNITY)
Admission: EM | Admit: 2023-10-21 | Discharge: 2023-10-22 | Disposition: A | Discharge status: Other Acute Inpatient Hospital | Attending: Emergency Medicine | Admitting: Emergency Medicine

## 2023-10-21 ENCOUNTER — Ambulatory Visit: Payer: 59 | Admitting: Occupational Therapy

## 2023-10-21 DIAGNOSIS — Z87891 Personal history of nicotine dependence: Secondary | ICD-10-CM | POA: Diagnosis not present

## 2023-10-21 DIAGNOSIS — I472 Ventricular tachycardia, unspecified: Secondary | ICD-10-CM | POA: Insufficient documentation

## 2023-10-21 DIAGNOSIS — Z7901 Long term (current) use of anticoagulants: Secondary | ICD-10-CM | POA: Diagnosis not present

## 2023-10-21 DIAGNOSIS — J45909 Unspecified asthma, uncomplicated: Secondary | ICD-10-CM | POA: Diagnosis not present

## 2023-10-21 DIAGNOSIS — R42 Dizziness and giddiness: Secondary | ICD-10-CM | POA: Diagnosis present

## 2023-10-21 DIAGNOSIS — Z95810 Presence of automatic (implantable) cardiac defibrillator: Secondary | ICD-10-CM | POA: Diagnosis not present

## 2023-10-21 LAB — COMPREHENSIVE METABOLIC PANEL
ALT: 64 U/L — ABNORMAL HIGH (ref 0–44)
AST: 65 U/L — ABNORMAL HIGH (ref 15–41)
Albumin: 4.2 g/dL (ref 3.5–5.0)
Alkaline Phosphatase: 35 U/L — ABNORMAL LOW (ref 38–126)
Anion gap: 15 (ref 5–15)
BUN: 11 mg/dL (ref 6–20)
CO2: 22 mmol/L (ref 22–32)
Calcium: 9.1 mg/dL (ref 8.9–10.3)
Chloride: 102 mmol/L (ref 98–111)
Creatinine, Ser: 1.12 mg/dL (ref 0.61–1.24)
GFR, Estimated: >60 mL/min (ref >60)
Glucose, Bld: 192 mg/dL — ABNORMAL HIGH (ref 70–99)
Potassium: 3.8 mmol/L (ref 3.5–5.1)
Sodium: 139 mmol/L (ref 135–145)
Total Bilirubin: 1 mg/dL (ref 0.0–1.2)
Total Protein: 7.3 g/dL (ref 6.5–8.1)

## 2023-10-21 LAB — I-STAT CHEM 8, ED
BUN: 13 mg/dL (ref 6–20)
Calcium, Ion: 1.11 mmol/L — ABNORMAL LOW (ref 1.15–1.40)
Chloride: 103 mmol/L (ref 98–111)
Creatinine, Ser: 1.1 mg/dL (ref 0.61–1.24)
Glucose, Bld: 193 mg/dL — ABNORMAL HIGH (ref 70–99)
HCT: 44 % (ref 39.0–52.0)
Hemoglobin: 15 g/dL (ref 13.0–17.0)
Potassium: 3.8 mmol/L (ref 3.5–5.1)
Sodium: 139 mmol/L (ref 135–145)
TCO2: 27 mmol/L (ref 22–32)

## 2023-10-21 LAB — CBC WITH DIFFERENTIAL/PLATELET
Abs Immature Granulocytes: 0.66 10*3/uL — ABNORMAL HIGH (ref 0.00–0.07)
Basophils Absolute: 0.1 10*3/uL (ref 0.0–0.1)
Basophils Relative: 0 %
Eosinophils Absolute: 0.1 10*3/uL (ref 0.0–0.5)
Eosinophils Relative: 0 %
HCT: 43.7 % (ref 39.0–52.0)
Hemoglobin: 14.4 g/dL (ref 13.0–17.0)
Immature Granulocytes: 3 %
Lymphocytes Relative: 24 %
Lymphs Abs: 4.6 10*3/uL — ABNORMAL HIGH (ref 0.7–4.0)
MCH: 30.1 pg (ref 26.0–34.0)
MCHC: 33 g/dL (ref 30.0–36.0)
MCV: 91.2 fL (ref 80.0–100.0)
Monocytes Absolute: 0.8 10*3/uL (ref 0.1–1.0)
Monocytes Relative: 4 %
Neutro Abs: 13 10*3/uL — ABNORMAL HIGH (ref 1.7–7.7)
Neutrophils Relative %: 69 %
Platelets: 238 10*3/uL (ref 150–400)
RBC: 4.79 MIL/uL (ref 4.22–5.81)
RDW: 12.8 % (ref 11.5–15.5)
WBC: 19.2 10*3/uL — ABNORMAL HIGH (ref 4.0–10.5)
nRBC: 0 % (ref 0.0–0.2)

## 2023-10-21 LAB — TROPONIN I (HIGH SENSITIVITY)
Troponin I (High Sensitivity): 11 ng/L (ref <18)
Troponin I (High Sensitivity): 19 ng/L — ABNORMAL HIGH (ref <18)

## 2023-10-21 LAB — MAGNESIUM: Magnesium: 1.9 mg/dL (ref 1.7–2.4)

## 2023-10-21 MED ORDER — AMIODARONE HCL IN DEXTROSE 360-4.14 MG/200ML-% IV SOLN
60.0000 mg/h | INTRAVENOUS | Status: AC
  Administered 2023-10-21: 60 mg/h via INTRAVENOUS

## 2023-10-21 MED ORDER — SODIUM CHLORIDE 0.9 % IV BOLUS
1000.0000 mL | Freq: Once | INTRAVENOUS | Status: AC
  Administered 2023-10-21: 1000 mL via INTRAVENOUS

## 2023-10-21 MED ORDER — CLONAZEPAM 0.5 MG PO TABS
0.5000 mg | ORAL_TABLET | Freq: Two times a day (BID) | ORAL | Status: DC | PRN
  Administered 2023-10-21: 0.5 mg via ORAL
  Filled 2023-10-21: qty 1

## 2023-10-21 MED ORDER — MAGNESIUM SULFATE 2 GM/50ML IV SOLN
2.0000 g | Freq: Once | INTRAVENOUS | Status: AC
  Administered 2023-10-21: 2 g via INTRAVENOUS
  Filled 2023-10-21: qty 50

## 2023-10-21 MED ORDER — POTASSIUM CHLORIDE CRYS ER 20 MEQ PO TBCR
40.0000 meq | EXTENDED_RELEASE_TABLET | Freq: Once | ORAL | Status: AC
  Administered 2023-10-21: 40 meq via ORAL
  Filled 2023-10-21: qty 2

## 2023-10-21 MED ORDER — AMIODARONE HCL IN DEXTROSE 360-4.14 MG/200ML-% IV SOLN
30.0000 mg/h | INTRAVENOUS | Status: DC
  Administered 2023-10-21: 30 mg/h via INTRAVENOUS
  Filled 2023-10-21: qty 200

## 2023-10-21 MED ORDER — NEBIVOLOL HCL 5 MG PO TABS
5.0000 mg | ORAL_TABLET | Freq: Every day | ORAL | Status: DC
  Administered 2023-10-21: 5 mg via ORAL
  Filled 2023-10-21: qty 1

## 2023-10-21 NOTE — ED Provider Notes (Signed)
 [place] EMERGENCY DEPARTMENT AT [facility] Provider Note  CSN: [id number] Arrival date & time: 10/21/23 1655  Chief Complaint(s) Ventricular Tachycardia  HPI [name] is a 48 y.o. male history of ARVC status post ICD placement, on transplant list, presenting to the emergency department with ICD shock.  The patient reports he was playing with his children, went to go after them, suddenly felt weak and lightheaded, around 20 seconds later had ICD shock.  Paramedics were called, patient had further episode while in ambulance with paramedics, with 1 additional shock.  Received a total of 450 mg of amiodarone IV.  He reports feeling anxious, generalized weakness.  No chest pain, shortness of breath, abdominal pain, headache.  No recent fevers or chills, illness.   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 10/10 and [facility] 2013   Patient Active Problem List   Diagnosis Date Noted   Influenza 09/30/2023   Tremor 09/01/2023   Gait instability 09/01/2023   Arm swelling 08/13/2023   Superficial venous thrombosis of left upper extremity 07/20/2023   Left forearm pain 07/14/2023   PVC's (premature ventricular contractions) 06/17/2023   Subacute cough 04/15/2023   Post-viral cough syndrome 04/15/2023   Acute pain of right wrist 01/26/2023   Scaphoid fracture of wrist 01/26/2023   Seborrheic keratoses 09/01/2022   Stucco keratoses 09/01/2022   Angioma of skin 09/01/2022   Lentigo  09/01/2022   Fever and chills 09/01/2022   Myalgia 09/01/2022   Nausea and vomiting 06/03/2022   History of hypokalemia 06/03/2022   Generalized abdominal pain 06/03/2022   Right elbow pain 05/02/2022   Fatigue 05/02/2022   Serum calcium elevated 05/02/2022   Paresthesia of left arm 01/23/2022   Localized osteoporosis with current pathological fracture 10/24/2021   Osteopenia 06/18/2021   Genital warts 06/03/2021   Condyloma 06/03/2021   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   Other skin changes due to chronic exposure to nonionizing radiation 06/03/2021   Lumbar radiculopathy 05/14/2021   Stress reaction 03/07/2021   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HCC) 09/14/2019   Kidney stone 06/13/2019   BPH (benign prostatic hyperplasia) 07/27/2018   Elevated glucose level 04/06/2017   GAD (generalized anxiety disorder) 08/16/2016   Routine general medical examination at a health care facility 03/28/2016   Hypertriglyceridemia 02/21/2014   Low HDL (under 40) 02/21/2014   Vitamin D deficiency 02/21/2014   Allergic rhinitis 02/21/2014   Erectile dysfunction 08/19/2013   OSA (obstructive sleep apnea) 10/30/2011   Environmental allergies 10/27/2011   High risk medications (not anticoagulants) long-term use 10/27/2011   Hypokalemia 09/29/2011   Arrhythmogenic RV cardiomyopathy 03/04/2011   Implantable cardioverter-defibrillator (ICD) in situ 01/10/2011   PTSD (post-traumatic stress disorder) 2/2 ICD shocks 01/10/2011   Fatty liver 01/04/2009   GERD 12/05/2008   History of ventricular tachycardia 11/03/2008   ECZEMA 08/17/2007   Home Medication(s) Prior to Admission medications  Medication Sig Start Date End Date Taking? Authorizing Provider  amiodarone (PACERONE) 200 MG tablet Take 100 mg by mouth daily. 08/15/23 08/14/24 Yes [provider]  busPIRone (BUSPAR) 5 MG tablet Take 5 mg by mouth 2 (two) times daily. 10/19/23  Yes [provider]  Calcium  Citrate-Vitamin D (CITRACAL + D PO) Take 1 tablet by mouth 2 (two) times daily.   Yes [provider]  Cholecalciferol (VITAMIN D-3 PO) Take 1 capsule by mouth at bedtime.   Yes [provider]  clonazePAM (KLONOPIN) 0.5 MG tablet Take 0.5 mg by mouth 3 (three) times daily as needed for anxiety.   Yes [provider]  Magnesium Gluconate 250 MG TABS Take 2 tablets by mouth 2 (two) times daily.   Yes [provider]  montelukast (SINGULAIR) 10 MG tablet TAKE 1 TABLET BY MOUTH EVERYDAY AT BEDTIME 09/28/23  Yes [name], [name], MD  Multiple Vitamin (MULITIVITAMIN WITH MINERALS) TABS Take 1 tablet by mouth daily.   Yes [provider]  nebivolol (BYSTOLIC) 5 MG tablet TAKE 1 TABLET (5 MG TOTAL) BY MOUTH DAILY. Patient taking differently: Take 5 mg by mouth at bedtime. 09/29/23  Yes [name], MD  potassium chloride SA (KLOR-CON M) 20 MEQ tablet Take 40 mEq by mouth daily.   Yes [provider]  sertraline (ZOLOFT) 50 MG tablet Take 50 mg by mouth daily.   Yes [provider]  spironolactone (ALDACTONE) 25 MG tablet Take 25 mg by mouth daily. 05/29/23 05/28/24 Yes [provider]  tadalafil (CIALIS) 5 MG tablet Take 5 mg by mouth daily as needed for erectile dysfunction.   Yes [provider]  Zinc 30 MG TABS Take 30 mg by mouth at bedtime.   Yes [provider]  apixaban (ELIQUIS) 5 MG TABS tablet Take 1 tablet (5 mg total) by mouth 2 (two) times daily. Patient not taking: Reported on 10/14/2023 08/07/23   [name], [name], MD  benzonatate (TESSALON) 200 MG capsule Take 1 capsule (200 mg total) by mouth 2 (two) times daily as needed for cough. Patient not taking: Reported on 10/08/2023 09/30/23   [name], [name], MD  oseltamivir (TAMIFLU) 75 MG capsule Take 1 capsule (75 mg total) by mouth 2 (two) times daily. Patient not taking: Reported on 10/08/2023 09/30/23   Judy Pimple, MD                                                                                                                                     Past Surgical History Past Surgical History:  Procedure Laterality Date   CARDIAC CATHETERIZATION  06/01/2006   CARDIAC DEFIBRILLATOR PLACEMENT  06/03/2006   [employer]   CHOLECYSTECTOMY N/A 08/01/2021   Procedure: LAPAROSCOPIC CHOLECYSTECTOMY WITH INTRAOPERATIVE CHOLANGIOGRAM;  Surgeon: Harriette Bouillon, MD;  Location: [facility] ORS;  Service: General;  Laterality: N/A;   HAND SURGERY Left    ICD GENERATOR  CHANGEOUT N/A 11/04/2019   Procedure: ICD GENERATOR CHANGEOUT;  Surgeon: [name], MD;  Location: [facility] INVASIVE CV LAB;  Service: Cardiovascular;  Laterality: N/A;   IMPLANTABLE CARDIOVERTER DEFIBRILLATOR GENERATOR CHANGE N/A 12/11/2011   Procedure: IMPLANTABLE CARDIOVERTER DEFIBRILLATOR GENERATOR CHANGE;  Surgeon: [name], MD;  Location: [facility] CATH LAB;  Service: Cardiovascular;  Laterality: N/A;   KYPHOPLASTY N/A 05/14/2021   Procedure: Lumbar Five Vertebroplasty;  Surgeon: Jadene Pierini, MD;  Location: [facility] OR;  Service: Neurosurgery;  Laterality: N/A;   LUMBAR LAMINECTOMY/ DECOMPRESSION WITH MET-RX N/A 05/14/2021   Procedure: Lumbar Four-Five Minimally Invasive Laminectomy;  Surgeon: Jadene Pierini, MD;  Location: [facility] OR;  Service: Neurosurgery;  Laterality: N/A;  3C/RM 19   TONSILLECTOMY     vt ablation  10/10, 10/13   x 2   WISDOM TOOTH EXTRACTION     Family History Family History  Problem Relation Age of Onset   Sudden death Father    Asthma Mother    Diabetes Other    Stroke Other    Heart attack Other    Prostate cancer Other    Breast cancer Other    Ovarian cancer Other    Uterine cancer Other    Colon cancer Other    Drug abuse Other    Depression Other    Lung cancer Maternal Grandfather    Heart disease Maternal Grandfather    Heart disease Paternal Grandfather    Hypertension Paternal Grandmother     Social History Social History   Tobacco Use    Smoking status: Former    Current packs/day: 0.00    Average packs/day: 1 pack/day for 18.0 years (18.0 ttl pk-yrs)    Types: Cigarettes    Start date: 01/17/1990    Quit date: 08/19/2007    Years since quitting: 16.1   Smokeless tobacco: Never  Vaping Use   Vaping status: Never Used  Substance Use Topics   Alcohol use: No    Alcohol/week: 0.0 standard drinks of alcohol   Drug use: No   Allergies Bee venom and Vancomycin  Review of Systems Review of Systems  All other systems reviewed and are negative.   Physical Exam Vital Signs  I have reviewed the triage vital signs BP 102/70   Pulse 63   Temp 98 F (36.7 C)   Resp 19   Ht 5\' 10"  (1.778 m)   Wt 88.5 kg   SpO2 97%   BMI 27.98 kg/m  Physical Exam Vitals and nursing note reviewed.  Constitutional:      General: He is not in acute distress.    Appearance: Normal appearance.  HENT:     Mouth/Throat:     Mouth: Mucous membranes are moist.  Eyes:     Conjunctiva/sclera: Conjunctivae normal.  Cardiovascular:     Rate and Rhythm: Normal rate and regular rhythm.  Pulmonary:     Effort: Pulmonary effort is normal. No respiratory distress.     Breath sounds: Normal breath sounds.  Abdominal:     General: Abdomen is flat.     Palpations: Abdomen is soft.     Tenderness: There is no abdominal tenderness.  Musculoskeletal:     Right lower leg: No edema.     Left lower leg: No edema.  Skin:    General: Skin is warm and dry.     Capillary Refill: Capillary refill takes less than 2 seconds.  Neurological:     Mental Status: He is alert and oriented  to person, place, and time. Mental status is at baseline.  Psychiatric:        Mood and Affect: Mood normal.        Behavior: Behavior normal.     ED Results and Treatments Labs (all labs ordered are listed, but only abnormal results are displayed) Labs Reviewed  COMPREHENSIVE METABOLIC PANEL - Abnormal; Notable for the following components:      Result Value    Glucose, Bld 192 (*)    AST 65 (*)    ALT 64 (*)    Alkaline Phosphatase 35 (*)    All other components within normal limits  CBC WITH DIFFERENTIAL/PLATELET - Abnormal; Notable for the following components:   WBC 19.2 (*)    Neutro Abs 13.0 (*)    Lymphs Abs 4.6 (*)    Abs Immature Granulocytes 0.66 (*)    All other components within normal limits  I-STAT CHEM 8, ED - Abnormal; Notable for the following components:   Glucose, Bld 193 (*)    Calcium, Ion 1.11 (*)    All other components within normal limits  TROPONIN I (HIGH SENSITIVITY) - Abnormal; Notable for the following components:   Troponin I (High Sensitivity) 19 (*)    All other components within normal limits  MAGNESIUM  TROPONIN I (HIGH SENSITIVITY)                                                                                                                          Radiology DG Chest Portable 1 View Result Date: 10/21/2023 CLINICAL DATA:  Syncopal episode.  Dizziness. EXAM: PORTABLE CHEST 1 VIEW COMPARISON:  06/26/2023 FINDINGS: Stable mild cardiomegaly. AICD remains in place. Both lungs are clear. IMPRESSION: Mild cardiomegaly. No active disease. Electronically Signed   By: Danae Orleans M.D.   On: 10/21/2023 20:04    Pertinent labs & imaging results that were available during my care of the patient were reviewed by me and considered in my medical decision making (see MDM for details).  Medications Ordered in ED Medications  amiodarone (NEXTERONE PREMIX) 360-4.14 MG/200ML-% (1.8 mg/mL) IV infusion (0 mg/hr Intravenous Stopped 10/21/23 2323)  amiodarone (NEXTERONE PREMIX) 360-4.14 MG/200ML-% (1.8 mg/mL) IV infusion (30 mg/hr Intravenous New Bag/Given 10/21/23 2326)  nebivolol (BYSTOLIC) tablet 5 mg (5 mg Oral Given 10/21/23 2240)  clonazePAM (KLONOPIN) tablet 0.5 mg (0.5 mg Oral Given 10/21/23 1842)  potassium chloride SA (KLOR-CON M) CR tablet 40 mEq (40 mEq Oral Given 10/21/23 1835)  magnesium sulfate IVPB 2 g 50 mL (0 g  Intravenous Stopped 10/21/23 2057)  sodium chloride 0.9 % bolus 1,000 mL (0 mLs Intravenous Stopped 10/21/23 2057)  Procedures .Critical Care  Performed by: Lonell Grandchild, MD Authorized by: Lonell Grandchild, MD   Critical care provider statement:    Critical care time (minutes):  30   Critical care was necessary to treat or prevent imminent or life-threatening deterioration of the following conditions:  Cardiac failure   Critical care was time spent personally by me on the following activities:  Development of treatment plan with patient or surrogate, discussions with consultants, evaluation of patient's response to treatment, examination of patient, ordering and review of laboratory studies, ordering and review of radiographic studies, ordering and performing treatments and interventions, pulse oximetry, re-evaluation of patient's condition and review of old charts   Care discussed with: accepting provider at another facility     (including critical care time)  Medical Decision Making / ED Course   MDM:  48 year old male presenting to the emergency department with ICD shock.  Patient overall well-appearing, vitals are reassuring, mild hypotension.  Currently in sinus rhythm.  Reviewed paramedic rhythm strips which do show episodes of VT.  Will interrogate pacemaker.  Patient started on amiodarone drip.  Suspect VT is due to his underlying cardiomyopathy.  Did have mild hypokalemia and hypomagnesemia so given potassium and magnesium.  Cardiology also recommended giving him his home nebivolol.  He is on the transplant list at [facility] so we will attempt to transfer him to [facility] for this.  Low concern for other cause of VT such as ACS.  Will monitor closely.  Dr. Nelly Laurence with cardiology recommends that we try lidocaine if he has any recurrence of VT  Clinical  Course as of 10/21/23 2341  Wed Oct 21, 2023  2053 Patient has been accepted for transfer to [facility] by Dr. Zoila Shutter [WS]  2313 If vtach, 100 lido with drip [MK]  2337 Signed out to Dr. Posey Rea pending transfer to [facility].  [WS]    Clinical Course User Index [MK] Kommor, [place], MD [WS] Suezanne Jacquet, Jerilee Field, MD     Additional history obtained: -Additional history obtained from ems -External records from outside source obtained and reviewed including: Chart review including previous notes, labs, imaging, consultation notes including prior record    Lab Tests: -I ordered, reviewed, and interpreted labs.   The pertinent results include:   Labs Reviewed  COMPREHENSIVE METABOLIC PANEL - Abnormal; Notable for the following components:      Result Value   Glucose, Bld 192 (*)    AST 65 (*)    ALT 64 (*)    Alkaline Phosphatase 35 (*)    All other components within normal limits  CBC WITH DIFFERENTIAL/PLATELET - Abnormal; Notable for the following components:   WBC 19.2 (*)    Neutro Abs 13.0 (*)    Lymphs Abs 4.6 (*)    Abs Immature Granulocytes 0.66 (*)    All other components within normal limits  I-STAT CHEM 8, ED - Abnormal; Notable for the following components:   Glucose, Bld 193 (*)    Calcium, Ion 1.11 (*)    All other components within normal limits  TROPONIN I (HIGH SENSITIVITY) - Abnormal; Notable for the following components:   Troponin I (High Sensitivity) 19 (*)    All other components within normal limits  MAGNESIUM  TROPONIN I (HIGH SENSITIVITY)    Notable for leukocytosis, likely reactive, nonspecific LFT abnormalities   EKG  NSR, frequent PVC  Imaging Studies ordered: I ordered imaging studies including CXR On my interpretation imaging demonstrates no acute process I independently visualized and  interpreted imaging. I agree with the radiologist interpretation   Medicines ordered and prescription drug management: Meds ordered this encounter  Medications    amiodarone (NEXTERONE PREMIX) 360-4.14 MG/200ML-% (1.8 mg/mL) IV infusion   amiodarone (NEXTERONE PREMIX) 360-4.14 MG/200ML-% (1.8 mg/mL) IV infusion   potassium chloride SA (KLOR-CON M) CR tablet 40 mEq   nebivolol (BYSTOLIC) tablet 5 mg   clonazePAM (KLONOPIN) tablet 0.5 mg   magnesium sulfate IVPB 2 g 50 mL   sodium chloride 0.9 % bolus 1,000 mL    -I have reviewed the patients home medicines and have made adjustments as needed   Consultations Obtained: I requested consultation with the cardiologist,  and discussed lab and imaging findings as well as pertinent plan - they recommend: admission   Cardiac Monitoring: The patient was maintained on a cardiac monitor.  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    Final Clinical Impression(s) / ED Diagnoses Final diagnoses:  Ventricular tachycardia (HCC)     This chart was dictated using voice recognition software.  Despite best efforts to proofread,  errors can occur which can change the documentation meaning.    Lonell Grandchild,  MD 10/21/23 [phone]

## 2023-10-21 NOTE — ED Notes (Signed)
 ICD HAS BEEN INTERROGATED

## 2023-10-21 NOTE — Consult Note (Incomplete)
 Cardiology Consultation   Patient ID: [name] MRN: [id number]; DOB: Oct 31, 1975  Admit date: 10/21/2023 Date of Consult: 10/21/2023  PCP:  Judy Pimple, MD   [facility] Providers Cardiologist:  Sherryl Manges, MD  Electrophysiologist:  Sherryl Manges, MD       Patient Profile:   [name] is a 48 y.o. male with a hx of VT who is being seen 10/21/2023 for the evaluation of recurrent VT at the request of Dr. Suezanne Jacquet.  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  He has undergone VT ablations -- endocardial and epicardial -- at [facility] and [place].  He was hospitalized 02/2023 following ICD shock in the setting of reducing magnesium by endocrinologist due to concerns for GI side effects.  He was maintained on sotalol.  Sotalol failed, and he was switched to amiodarone, maintained on 100mg  daily. He has been managed with lidocaine during hospitalizations at [facility].    He follows with [facility] cardiology/EP and is recently been placed on the transplant list.  His ICD has also recently been adjusted to allow more time for ATP therapy prior to shock.  Currently on 100 mg amiodarone daily, 500 mg magnesium daily, 5 mg Bystolic nightly, 25 mg spironolactone.  He is on 50 mg of Zoloft, but has continued to struggle with depression/anxiety.  Consideration of increasing Zoloft to 100 mg was considered, but ultimately decided against by EP.  He instead started 5 mg Wellbutrin twice daily.  His first dose of Wellbutrin was last night.  This morning he took Wellbutrin and Cialis.  He has taken Cialis in the past without incident.  Today, he was in the yard with his 2-year-old daughter playing.  He needed to walk briskly towards her to prevent an accident.  He became short of breath and could feel PVCs.  Shortly thereafter, he  felt dizziness and tried to lay down but his ICD delivered a shock prior to him falling to his knees.  EMS was dispatched.  Apparently with EMS, he had recurrence of VT and ICD shock.  He received in total 450 mg IV amiodarone.  He has not received additional magnesium or lidocaine.  He is currently hemodynamically stable.  He reports some mild chest discomfort.  He relays that the [facility] transplant team communicated that if he had further VT, he may need to be hospitalized at [facility] while waiting for transplant.  ICD has been interrogated, I do not have these results yet.    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   Asthma when younger   Fatty liver    History of chicken pox    History of kidney stones    MDT DVFB1D1 Visia AF MRI VR  ICD (implantable cardiac defibrillator) in place    Osteopenia    Pancreatitis    Sleep apnea    uses cpap   Ventricular tachycardia (HCC)    sotalol therapy;  catheter ablation at [facility] 10/10 and [facility] 2013    Past Surgical History:  Procedure Laterality Date   CARDIAC CATHETERIZATION  06/01/2006   CARDIAC DEFIBRILLATOR PLACEMENT  06/03/2006   Medtronic   CHOLECYSTECTOMY N/A  08/01/2021   Procedure: LAPAROSCOPIC CHOLECYSTECTOMY WITH INTRAOPERATIVE CHOLANGIOGRAM;  Surgeon: Harriette Bouillon, MD;  Location: [facility] ORS;  Service: General;  Laterality: N/A;   HAND SURGERY Left    ICD GENERATOR CHANGEOUT N/A 11/04/2019   Procedure: ICD GENERATOR CHANGEOUT;  Surgeon: [name], MD;  Location: [facility] INVASIVE CV LAB;  Service: Cardiovascular;  Laterality: N/A;   IMPLANTABLE CARDIOVERTER DEFIBRILLATOR GENERATOR CHANGE N/A 12/11/2011   Procedure: IMPLANTABLE CARDIOVERTER DEFIBRILLATOR GENERATOR CHANGE;  Surgeon: [name], MD;  Location: [facility] CATH LAB;  Service: Cardiovascular;  Laterality: N/A;    KYPHOPLASTY N/A 05/14/2021   Procedure: Lumbar Five Vertebroplasty;  Surgeon: Jadene Pierini, MD;  Location: [facility] OR;  Service: Neurosurgery;  Laterality: N/A;   LUMBAR LAMINECTOMY/ DECOMPRESSION WITH MET-RX N/A 05/14/2021   Procedure: Lumbar Four-Five Minimally Invasive Laminectomy;  Surgeon: Jadene Pierini, MD;  Location: [facility] OR;  Service: Neurosurgery;  Laterality: N/A;  3C/RM 19   TONSILLECTOMY     vt ablation  10/10, 10/13   x 2   WISDOM TOOTH EXTRACTION       Home Medications:  Prior to Admission medications   Medication Sig Start Date End Date Taking? Authorizing Provider  busPIRone (BUSPAR) 5 MG tablet Take 5 mg by mouth 2 (two) times daily. 10/19/23  Yes [provider]  amiodarone (PACERONE) 200 MG tablet Take 100 mg by mouth daily. 08/15/23 08/14/24  [provider]  apixaban (ELIQUIS) 5 MG TABS tablet Take 1 tablet (5 mg total) by mouth 2 (two) times daily. Patient not taking: Reported on 10/14/2023 08/07/23   [name], [name], MD  benzonatate (TESSALON) 200 MG capsule Take 1 capsule (200 mg total) by mouth 2 (two) times daily as needed for cough. Patient not taking: Reported on 10/08/2023 09/30/23   [name], [name], MD  Calcium Citrate-Vitamin D (CITRACAL + D PO) Take 1 tablet by mouth 2 (two) times daily.    [provider]  cetirizine (ZYRTEC) 5 MG tablet Take by mouth.    [provider]  Cholecalciferol (VITAMIN D-3 PO) Take 1 capsule by mouth at bedtime.    [provider]  clonazePAM (KLONOPIN) 0.5 MG tablet Take 0.5 mg by mouth 2 (two) times daily.    [provider]  fexofenadine (ALLEGRA) 180 MG tablet Take 180 mg by mouth daily. Patient not taking: Reported on 10/08/2023    [provider]  Magnesium Gluconate 250 MG TABS Take 2 tablets by mouth 2 (two) times daily.    [provider]  montelukast (SINGULAIR) 10 MG tablet TAKE 1 TABLET BY MOUTH EVERYDAY AT BEDTIME 09/28/23   [name], [name], MD   Multiple Vitamin (MULITIVITAMIN WITH MINERALS) TABS Take 1 tablet by mouth daily.    [provider]  nebivolol (BYSTOLIC) 5 MG tablet TAKE 1 TABLET (5 MG TOTAL) BY MOUTH DAILY. 09/29/23   [name], MD  oseltamivir (TAMIFLU) 75 MG capsule Take 1 capsule (75 mg total) by mouth 2 (two) times daily. Patient not taking: Reported on 10/08/2023 09/30/23   [name], Idamae Schuller A, MD  potassium chloride SA (KLOR-CON M) 20 MEQ tablet Take 40 mEq by mouth daily.    [provider]  sertraline (ZOLOFT) 50 MG tablet Take 50 mg by mouth daily.    [provider]  spironolactone (ALDACTONE) 25 MG tablet Take 25 mg by mouth daily. 05/29/23 05/28/24  [provider]  tadalafil (CIALIS) 5 MG tablet Take 5 mg by mouth daily as needed for erectile dysfunction.  [provider]  Zinc 30 MG TABS Take 30 mg by mouth at bedtime.    [provider]    Inpatient Medications: Scheduled Meds:  Continuous Infusions:  PRN Meds:   Allergies:    Allergies  Allergen Reactions   Bee Venom Anaphylaxis   Vancomycin Itching and Other (See Comments)    Tolerated well after giving benadryl and doubling the infusion time    Social History:   Social History   Socioeconomic History   Marital status: Married    Spouse name: Not on file   Number of children: 0   Years of education: Not on file   Highest education level: Not on file  Occupational History   Occupation: EMT/PARAMEDIC  Tobacco Use   Smoking status: Former    Current packs/day: 0.00    Average packs/day: 1 pack/day for 18.0 years (18.0 ttl pk-yrs)    Types: Cigarettes    Start date: 01/17/1990    Quit date: 08/19/2007    Years since quitting: 16.1   Smokeless tobacco: Never  Vaping Use   Vaping status: Never Used  Substance and Sexual Activity   Alcohol use: No    Alcohol/week: 0.0 standard drinks of alcohol   Drug use: No   Sexual activity: Yes  Other Topics Concern   Not on file  Social  History Narrative   Works as a Radiation protection practitioner   Social Drivers of Corporate investment banker Strain: Low Risk  (08/06/2023)   Received from [facility]   Overall Financial Resource Strain (CARDIA)    Difficulty of Paying Living Expenses: Not hard at all  Food Insecurity: No Food Insecurity (08/11/2023)   Hunger Vital Sign    Worried About Running Out of Food in the Last Year: Never true    Ran Out of Food in the Last Year: Never true  Transportation Needs: No Transportation Needs (08/11/2023)   PRAPARE - Administrator, Civil Service (Medical): No    Lack of Transportation (Non-Medical): No  Physical Activity: Not on file  Stress: Not on file  Social Connections: Not on file  Intimate Partner Violence: Not At Risk (08/11/2023)   Humiliation, Afraid, Rape, and Kick questionnaire    Fear of Current or Ex-Partner: No    Emotionally Abused: No    Physically Abused: No    Sexually Abused: No    Family History:    Family History  Problem Relation Age of Onset   Sudden death Father    Asthma Mother    Diabetes Other    Stroke Other    Heart attack Other    Prostate cancer Other    Breast cancer Other    Ovarian cancer Other    Uterine cancer Other    Colon cancer Other    Drug abuse Other    Depression Other    Lung cancer Maternal Grandfather    Heart disease Maternal Grandfather    Heart disease Paternal Grandfather    Hypertension Paternal Grandmother      ROS:  Please see the history of present illness.   All other ROS reviewed and negative.     Physical Exam/Data:   Vitals:   10/21/23 1654 10/21/23 1655 10/21/23 1701  BP:  127/83   Pulse: 82 82   Resp:  18   Temp:   98 F (36.7 C)  SpO2:  98%   Weight:  88.5 kg   Height:  5\' 10"  (1.778 m)  No intake or output data in the 24 hours ending 10/21/23 1721    10/21/2023    4:55 PM 10/08/2023    3:00 PM 09/30/2023    9:45 AM  Last 3 Weights  Weight (lbs) 195 lb 195 lb 8 oz 195 lb  8 oz  Weight (kg) 88.451 kg 88.678 kg 88.678 kg     Body mass index is 27.98 kg/m.  General:  Well nourished, well developed, in no acute distress but appropriately anxious appearing HEENT: normal Neck: no JVD Vascular: No carotid bruits; Distal pulses 2+ bilaterally Cardiac:  normal S1, S2; RRR; no murmur  Lungs:  clear to auscultation bilaterally, no wheezing, rhonchi or rales  Abd: soft, nontender, no hepatomegaly  Ext: no edema Musculoskeletal:  No deformities, BUE and BLE strength normal and equal Skin: warm and dry  Neuro:  CNs 2-12 intact, no focal abnormalities noted Psych:  Normal affect   EKG:  The EKG was personally reviewed and demonstrates:  EMS runs strips reviewed with monomorphic VT Telemetry:  Telemetry was personally reviewed and demonstrates:  sinus rhythm in the 70s with PVCs, no further VT  Relevant CV Studies:    Laboratory Data:  High Sensitivity Troponin:  No results for input(s): "TROPONINIHS" in the last 720 hours.   Chemistry Recent Labs  Lab 10/21/23 1712  NA 139  K 3.8  CL 103  GLUCOSE 193*  BUN 13  CREATININE 1.10    No results for input(s): "PROT", "ALBUMIN", "AST", "ALT", "ALKPHOS", "BILITOT" in the last 168 hours. Lipids No results for input(s): "CHOL", "TRIG", "HDL", "LABVLDL", "LDLCALC", "CHOLHDL" in the last 168 hours.  Hematology Recent Labs  Lab 10/21/23 1712  HGB 15.0  HCT 44.0   Thyroid No results for input(s): "TSH", "FREET4" in the last 168 hours.  BNPNo results for input(s): "BNP", "PROBNP" in the last 168 hours.  DDimer No results for input(s): "DDIMER" in the last 168 hours.   Radiology/Studies:  No results found.   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 - has undergone multiple ablations and is currently listed for transplant at [facility] - VT has recurred on amiodarone 100mg  daily - Now s/p 450mg  total of amiodarone IV pushes, PVC burden significantly improved,  no further NSVT - Will continue with amiodarone drip - Transfer to [facility] where he is followed by EP and listed for transplant - If VT recurs, would give lidocaine 100mg  and start lidocaine drip    Risk Assessment/Risk Scores:   For questions or updates, please contact [facility] Please consult [email or web address] for contact info under    Signed, Marcelino Duster, PA  10/21/2023 5:21 PM  I personally interviewed and examined the patient and edited the above note. I have discussed with APP, ER MD, and our critical care cardiologist. The above assessment and plan is my own. I spent 65 minutes of critical care time.  Maurice Small, MD

## 2023-10-21 NOTE — ED Notes (Signed)
 Dr. Suezanne Jacquet informed of patient's BP of 89/58

## 2023-10-21 NOTE — ED Notes (Signed)
 He has been accepted to [facility] at [address], [place], [place] [address], bed [phone]. Call report to [phone]. Accepting is Dr. Ileene Hutchinson.  [phone] is the number [facility].

## 2023-10-21 NOTE — ED Triage Notes (Addendum)
 PER EMS: pt is from home with c/o dizziness and states he felt like his defibrillator fired.He went into Vtach when in the back of the ambulance with a rate of 180. EMS started an amio drip with no conversion. He was shocked by his own defib and HR went to 90s but then went back into vtach. A second amio drip was started and he converted. Upon arrival to hospital he went back into vtach but upon arriving to trauma B he is now in sinus rhythm with rate of 82. Pt placed on zoll upon arrival. GCS 15 107/52, HR-90, O2-99% RA  ICD shocked x 2 450mg  Amio total given by EMS

## 2023-10-21 NOTE — ED Notes (Signed)
 Dr. Nelly Laurence at bedside.

## 2023-10-21 NOTE — ED Notes (Signed)
 Called [employer] PT is waiting for a bed at [facility]

## 2023-10-21 NOTE — ED Notes (Signed)
 [facility] Transfer Line called; connected [facility] RN Kati to Dr Suezanne Jacquet; faxed face sheet to [facility]

## 2023-10-21 NOTE — ED Notes (Signed)
 [facility] has arrived to transport the patient to [facility]

## 2023-10-21 NOTE — ED Notes (Signed)
Dr. Scheving at bedside 

## 2023-10-21 NOTE — ED Notes (Signed)
CARDIOLOGY PA AT BEDSIDE 

## 2023-10-22 NOTE — ED Provider Notes (Signed)
  Physical Exam  BP 93/66   Pulse 60   Temp 98 F (36.7 C)   Resp 16   Ht 5\' 10"  (1.778 m)   Wt 88.5 kg   SpO2 94%   BMI 27.98 kg/m   Physical Exam Constitutional:      General: He is not in acute distress.    Appearance: Normal appearance.  HENT:     Head: Normocephalic and atraumatic.     Nose: No congestion or rhinorrhea.  Eyes:     General:        Right eye: No discharge.        Left eye: No discharge.     Extraocular Movements: Extraocular movements intact.     Pupils: Pupils are equal, round, and reactive to light.  Cardiovascular:     Rate and Rhythm: Normal rate and regular rhythm.     Heart sounds: No murmur heard. Pulmonary:     Effort: No respiratory distress.     Breath sounds: No wheezing or rales.  Abdominal:     General: There is no distension.     Tenderness: There is no abdominal tenderness.  Musculoskeletal:        General: Normal range of motion.     Cervical back: Normal range of motion.  Skin:    General: Skin is warm and dry.  Neurological:     General: No focal deficit present.     Mental Status: He is alert.     Procedures  Procedures  ED Course / MDM   Clinical Course as of 10/22/23 0132  Wed Oct 21, 2023  2053 Patient has been accepted for transfer to [facility] by Dr. Zoila Shutter [WS]  2313 If vtach, 100 lido with drip [MK]  2337 Signed out to Dr. Posey Rea pending transfer to [facility].  [WS]    Clinical Course User Index [MK] Cindie Rajagopalan, Wyn Forster, MD [WS] Suezanne Jacquet Jerilee Field, MD   Medical Decision Making Amount and/or Complexity of Data Reviewed Labs: ordered. Radiology: ordered.  Risk Prescription drug management.   Patient received in handoff.  ICD shocks pending transfer to [facility].  Bed available at [facility] and patient transferred       Glendora Score, MD 10/22/23 [phone]

## 2023-10-28 ENCOUNTER — Encounter: Payer: Self-pay | Admitting: Occupational Therapy

## 2023-10-28 DIAGNOSIS — R278 Other lack of coordination: Secondary | ICD-10-CM

## 2023-10-28 NOTE — Therapy (Signed)
 [facility] [address], [address] [place], [place], [address] Phone: [phone]   Fax:  [phone]  Patient Details  Name: [name] MRN: [id number] Date of Birth: 01/05/1976  OCCUPATIONAL THERAPY DISCHARGE SUMMARY  Visits from Start of Care: 1  Pt admitted to ED 3/25. All remaining outpt OT appointments canceled. Per 10/29/23 OT appointment notes, "patient will be in hospital until transplant is complete."  Current functional level related to goals / functional outcomes: Unable to assess goals.  Remaining deficits: Ongoing impairments, see tx notes for additional details.   Education / Equipment: Pt has some needed materials and education. See tx notes for more details.    Patient goals were unable to be assessed. Patient is being discharged due to change in medical status.  If additional OT services are recommended, a new OT referral will be required.     Wynetta Emery, OT 10/28/2023, 2:24 PM  [facility] [address] [place], [place], [address] Phone: [phone]   Fax:  [phone]

## 2023-10-29 ENCOUNTER — Encounter: Payer: 59 | Admitting: Occupational Therapy

## 2023-11-03 ENCOUNTER — Ambulatory Visit: Payer: BC Managed Care – PPO | Admitting: Internal Medicine

## 2023-11-03 ENCOUNTER — Telehealth: Payer: Self-pay | Admitting: Internal Medicine

## 2023-11-03 NOTE — Telephone Encounter (Signed)
 Patient called in stating he is currently at [facility] awaiting a heart transplant, date TBD. He states due to him not being at home where his monitor is + [facility] removing ICD at time of heart transplant, he would like to discontinue the remote monitoring.

## 2023-11-04 NOTE — Telephone Encounter (Signed)
 Discussed with Dr. Graciela Husbands.  OK to discontinue Pt monitoring.  All remote monitor appointment cancelled.  Pt discontinued in [facility].

## 2023-11-10 ENCOUNTER — Ambulatory Visit: Payer: Self-pay | Admitting: Internal Medicine

## 2023-12-04 ENCOUNTER — Encounter (HOSPITAL_COMMUNITY): Payer: Self-pay

## 2023-12-04 ENCOUNTER — Telehealth (HOSPITAL_COMMUNITY): Payer: Self-pay

## 2023-12-04 NOTE — Telephone Encounter (Signed)
 Called patient to see if he was interested in participating in the Cardiac Rehab Program. Patient will come in for orientation on 4/29@9 :30 and will attend the 10:15 exercise class.   Mailed package

## 2023-12-15 ENCOUNTER — Encounter (HOSPITAL_COMMUNITY)
Admission: RE | Admit: 2023-12-15 | Discharge: 2023-12-15 | Disposition: A | Discharge status: Home / Self Care | Source: Ambulatory Visit | Attending: Cardiology | Admitting: Cardiology

## 2023-12-15 ENCOUNTER — Encounter (HOSPITAL_COMMUNITY): Payer: Self-pay

## 2023-12-15 VITALS — BP 124/78 | HR 86 | Ht 69.5 in | Wt 183.2 lb

## 2023-12-15 DIAGNOSIS — Z941 Heart transplant status: Secondary | ICD-10-CM | POA: Diagnosis present

## 2023-12-15 HISTORY — DX: Heart transplant status: Z94.1

## 2023-12-15 NOTE — Progress Notes (Signed)
 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 2 tablets twice a day, Disp: , Rfl:    Calcium  Citrate-Vitamin D  (CITRACAL + D PO), Take 1 tablet by mouth 2 (two) times daily., Disp: , Rfl:    Cholecalciferol  1.25 MG (50000 UT) capsule, Take 1 capsule by mouth once a week., Disp: , Rfl:    fexofenadine (ALLEGRA) 180 MG tablet, Take 180 mg by mouth daily., Disp: , Rfl:    Fingerstix Lancets MISC, 1 each by Other route 2 (two) times daily., Disp: , Rfl:    mirtazapine (REMERON) 7.5 MG tablet, Take 7.5 mg by mouth at bedtime., Disp: , Rfl:    montelukast  (SINGULAIR ) 10 MG tablet, TAKE 1 TABLET BY MOUTH EVERYDAY AT BEDTIME, Disp: 90 tablet, Rfl: 2   Multiple Vitamin (MULITIVITAMIN WITH MINERALS) TABS, Take 1 tablet by mouth daily., Disp: , Rfl:    mycophenolate (CELLCEPT) 250 MG capsule, Take 250 mg by mouth 2 (two) times daily. 6 capsules twice a day, Disp: , Rfl:    pantoprazole (PROTONIX) 40 MG tablet, Take 40 mg by mouth daily., Disp: , Rfl:    polyethylene glycol (MIRALAX  / GLYCOLAX ) 17 g packet, Take 17 g by mouth daily as needed for moderate constipation., Disp: , Rfl:    potassium chloride  SA (KLOR-CON  M) 20 MEQ tablet, Take 20 mEq by mouth once., Disp: , Rfl:    PRECISION QID TEST test strip,  1 strip by Other route in the morning and at bedtime., Disp: , Rfl:    predniSONE  (DELTASONE ) 5 MG tablet, Take 15 mg by mouth daily with breakfast., Disp: , Rfl:    rosuvastatin (CRESTOR) 10 MG tablet, Take 10 mg by mouth daily., Disp: , Rfl:    senna-docusate (SENOKOT-S) 8.6-50 MG tablet, Take 1 tablet by mouth 2 (two) times daily., Disp: , Rfl:    sertraline  (ZOLOFT ) 50 MG tablet, Take 75 mg by mouth daily., Disp: , Rfl:    sulfamethoxazole-trimethoprim (BACTRIM) 400-80 MG tablet, Take 1 tablet by mouth daily., Disp: , Rfl:    tacrolimus (PROGRAF) 1 MG capsule, Take 1 mg by mouth 2 (two) times daily. 5 tablets in the morning and 4 tablets at night, Disp: , Rfl:    tadalafil (CIALIS) 5 MG tablet, Take 5 mg by mouth daily as needed for erectile dysfunction., Disp: , Rfl:    traZODone  (DESYREL ) 50 MG tablet, Take 25 mg by mouth at bedtime as needed., Disp: , Rfl:    valGANciclovir (VALCYTE) 450 MG tablet, Take 900 mg by mouth daily., Disp: , Rfl:    ASPIRIN LOW DOSE 81 MG tablet, Take 81 mg by mouth daily. 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   Asthma when younger   Fatty liver    History of chicken pox    History of kidney stones    MDT DVFB1D1 Visia AF MRI VR  ICD (implantable cardiac defibrillator) in place    Osteopenia    Pancreatitis    S/P orthotopic heart transplant (HCC)    At [facility]   Sleep apnea    uses cpap   Ventricular tachycardia (HCC)    sotalol  therapy;  catheter ablation at [facility] 10/10 and [facility] 2013    Tobacco Use: Social History   Tobacco Use  Smoking Status Former   Current packs/day: 0.00   Average packs/day: 1 pack/day for 18.0 years (18.0 ttl pk-yrs)   Types: Cigarettes   Start date: 01/17/1990   Quit date:  08/19/2007   Years since quitting: 16.3  Smokeless Tobacco Never    Labs: Review Flowsheet  More data exists      Latest Ref Rng & Units 09/05/2019 09/11/2020 09/17/2021 07/15/2023 10/21/2023  Labs for ITP Cardiac and Pulmonary Rehab  Cholestrol 0 - 200 mg/dL 161  096  045  - -  Direct LDL mg/dL 409.8  119.1  478.2  - -  HDL-C >39.00 mg/dL 95.62  13.08  65.78  - -  Trlycerides 0.0 - 149.0 mg/dL 469.6  295.2  841.3  - -  Hemoglobin A1c 4.6 - 6.5 % 5.6  5.5  5.8  - -  TCO2 22 - 32 mmol/L - - - 28  27     Capillary Blood Glucose: No results found for: "GLUCAP"   Exercise Target Goals: Exercise Program Goal: Individual exercise prescription set using results from initial 6 min walk test and THRR while considering  patient's activity barriers and safety.   Exercise Prescription Goal: Initial exercise prescription builds to 30-45 minutes a day of aerobic activity, 2-3 days per week.  Home exercise guidelines will be given to patient during program as part of exercise prescription that the participant will acknowledge.  Activity Barriers & Risk Stratification:  Activity Barriers & Cardiac Risk Stratification - 12/15/23 1005       Activity Barriers & Cardiac Risk Stratification   Activity Barriers Other (comment)    Comments Post-surgical pain: left rib cage across abdomen; hx kyphoplasty & laminectomy: chronic, mild, low back pain; osteoporosis.    Cardiac Risk Stratification High             6 Minute Walk:  6 Minute Walk     Row Name 12/15/23 1055         6 Minute Walk   Phase Initial     Distance 1321 feet     Walk Time 6 minutes     # of Rest Breaks 0     MPH 2.5     METS 4.37     RPE 10     Perceived Dyspnea  1     VO2 Peak 15.29     Symptoms Yes (comment)     Comments Mild shortness of breath     Resting HR 86 bpm     Resting BP 124/78     Resting Oxygen Saturation  98 %     Exercise Oxygen Saturation  during 6 min walk 98 %     Max Ex. HR 91 bpm     Max  Ex. BP 140/82  2 Minute Post BP 136/80              Oxygen Initial Assessment:   Oxygen Re-Evaluation:   Oxygen Discharge (Final Oxygen Re-Evaluation):   Initial Exercise Prescription:  Initial Exercise Prescription - 12/15/23 1100       Date of Initial Exercise RX and Referring Provider   Date 12/15/23    Referring Provider Floria Hurst, MD [facility])   Jacqueline Matsu, MD (coverage)   Expected Discharge Date 03/09/24      Treadmill   MPH 2.5    Grade 0    Minutes 15    METs 2.91      NuStep   Level 2    SPM 85    Minutes 15    METs 2.9      Prescription Details   Frequency (times per week) 3    Duration Progress to 30 minutes of continuous aerobic without signs/symptoms of physical distress      Intensity   THRR 40-80% of Max Heartrate 70-140    Ratings of Perceived Exertion 11-13    Perceived Dyspnea 0-4      Progression   Progression Continue to progress workloads to maintain intensity without signs/symptoms of physical distress.      Resistance Training   Training Prescription Yes    Weight 3 lbs    Reps 10-15             Perform Capillary Blood Glucose checks as needed.  Exercise Prescription Changes:   Exercise Comments:   Exercise Goals and Review:   Exercise Goals     Row Name 12/15/23 0926             Exercise Goals   Increase Physical Activity Yes       Intervention Provide advice, education, support and counseling about physical activity/exercise needs.;Develop an individualized exercise prescription for aerobic and resistive training based on initial evaluation findings, risk stratification, comorbidities and participant's personal goals.       Expected Outcomes Short Term: Attend rehab on a regular basis to increase amount of physical activity.;Long Term: Exercising regularly at least 3-5 days a week.;Long Term: Add in home exercise to make exercise part of routine and to increase amount of physical activity.        Increase Strength and Stamina Yes       Intervention Provide advice, education, support and counseling about physical activity/exercise needs.;Develop an individualized exercise prescription for aerobic and resistive training based on initial evaluation findings, risk stratification, comorbidities and participant's personal goals.       Expected Outcomes Short Term: Increase workloads from initial exercise prescription for resistance, speed, and METs.;Short Term: Perform resistance training exercises routinely during rehab and add in resistance training at home;Long Term: Improve cardiorespiratory fitness, muscular endurance and strength as measured by increased METs and functional capacity ( )       Able to understand and use rate of perceived exertion (RPE) scale Yes       Intervention Provide education and explanation on how to use RPE scale       Expected Outcomes Short Term: Able to use RPE daily in rehab to express subjective intensity level;Long Term:  Able to use RPE to guide intensity level when exercising independently       Knowledge and understanding of Target Heart Rate Range (THRR) Yes       Intervention Provide education and explanation of THRR including how the numbers were predicted and where they are  located for reference       Expected Outcomes Short Term: Able to state/look up THRR;Long Term: Able to use THRR to govern intensity when exercising independently;Short Term: Able to use daily as guideline for intensity in rehab       Able to check pulse independently Yes       Intervention Provide education and demonstration on how to check pulse in carotid and radial arteries.;Review the importance of being able to check your own pulse for safety during independent exercise       Expected Outcomes Short Term: Able to explain why pulse checking is important during independent exercise;Long Term: Able to check pulse independently and accurately       Understanding of Exercise  Prescription Yes       Intervention Provide education, explanation, and written materials on patient's individual exercise prescription       Expected Outcomes Short Term: Able to explain program exercise prescription;Long Term: Able to explain home exercise prescription to exercise independently                Exercise Goals Re-Evaluation :   Discharge Exercise Prescription (Final Exercise Prescription Changes):   Nutrition:  Target Goals: Understanding of nutrition guidelines, daily intake of sodium 1500mg , cholesterol 200mg , calories 30% from fat and 7% or less from saturated fats, daily to have 5 or more servings of fruits and vegetables.  Biometrics:  Pre Biometrics - 12/15/23 0917       Pre Biometrics   Waist Circumference 36.5 inches    Hip Circumference 40.25 inches    Waist to Hip Ratio 0.91 %    Triceps Skinfold 14 mm    % Body Fat 24.3 %    Grip Strength 42 kg    Flexibility --   Not performed due to post-surgical pain.   Single Leg Stand 30 seconds              Nutrition Therapy Plan and Nutrition Goals:   Nutrition Assessments:  MEDIFICTS Score Key: >=70 Need to make dietary changes  40-70 Heart Healthy Diet <= 40 Therapeutic Level Cholesterol Diet    Picture Your Plate Scores: <16 Unhealthy dietary pattern with much room for improvement. 41-50 Dietary pattern unlikely to meet recommendations for good health and room for improvement. 51-60 More healthful dietary pattern, with some room for improvement.  >60 Healthy dietary pattern, although there may be some specific behaviors that could be improved.    Nutrition Goals Re-Evaluation:   Nutrition Goals Re-Evaluation:   Nutrition Goals Discharge (Final Nutrition Goals Re-Evaluation):   Psychosocial: Target Goals: Acknowledge presence or absence of significant depression and/or stress, maximize coping skills, provide positive support system. Participant is able to verbalize types and  ability to use techniques and skills needed for reducing stress and depression.  Initial Review & Psychosocial Screening:  Initial Psych Review & Screening - 12/15/23 1022       Initial Review   Current issues with History of Depression;Current Anxiety/Panic;Current Psychotropic Meds;Current Depression;Current Stress Concerns    Source of Stress Concerns Family;Chronic Illness      Family Dynamics   Good Support System? Yes   [name] has his wife Tanis Fan and Aunt for support   Comments [name] has PTSD and is receiving counseling every 2-3 weeks virtually via [facility]. Tommy admits to being currently depressed and experiences anxiety. [name] says that the antidpressant and antianxiety medications he is currently taking is working.      Barriers   Psychosocial  barriers to participate in program The patient should benefit from training in stress management and relaxation.      Screening Interventions   Interventions Encouraged to exercise;To provide support and resources with identified psychosocial needs;Provide feedback about the scores to participant    Expected Outcomes Long Term Goal: Stressors or current issues are controlled or eliminated.;Short Term goal: Identification and review with participant of any Quality of Life or Depression concerns found by scoring the questionnaire.;Long Term goal: The participant improves quality of Life and PHQ9 Scores as seen by post scores and/or verbalization of changes             Quality of Life Scores:  Quality of Life - 12/15/23 1151       Quality of Life   Select Quality of Life      Quality of Life Scores   Health/Function Pre 11.3 %    Socioeconomic Pre 21.07 %    Psych/Spiritual Pre 15.07 %    Family Pre 27.6 %    GLOBAL Pre 16.49 %            Scores of 19 and below usually indicate a poorer quality of life in these areas.  A difference of  2-3 points is a clinically meaningful difference.  A difference of 2-3 points in the total  score of the Quality of Life Index has been associated with significant improvement in overall quality of life, self-image, physical symptoms, and general health in studies assessing change in quality of life.  PHQ-9: Review Flowsheet  More data exists      12/15/2023 09/30/2023 08/31/2023 07/21/2023 04/15/2023  Depression screen PHQ 2/9  Decreased Interest 0 2 1 3 2   Down, Depressed, Hopeless 1 2 1 3 2   PHQ - 2 Score 1 4 2 6 4   Altered sleeping 1 2 1 3 1   Tired, decreased energy 1 3 2 3 3   Change in appetite 0 1 0 2 1  Feeling bad or failure about yourself  1 2 1 3  0  Trouble concentrating 1 1 1 3 3   Moving slowly or fidgety/restless 0 1 1 2 1   Suicidal thoughts 0 0 0 0 0  PHQ-9 Score 5 14 8 22 13   Difficult doing work/chores Extremely dIfficult Extremely dIfficult Extremely dIfficult Extremely dIfficult Very difficult   Interpretation of Total Score  Total Score Depression Severity:  1-4 = Minimal depression, 5-9 = Mild depression, 10-14 = Moderate depression, 15-19 = Moderately severe depression, 20-27 = Severe depression   Psychosocial Evaluation and Intervention:   Psychosocial Re-Evaluation:   Psychosocial Discharge (Final Psychosocial Re-Evaluation):   Vocational Rehabilitation: Provide vocational rehab assistance to qualifying candidates.   Vocational Rehab Evaluation & Intervention:  Vocational Rehab - 12/15/23 1030       Initial Vocational Rehab Evaluation & Intervention   Assessment shows need for Vocational Rehabilitation No   [name] is currently on LOA and does not need vocational rehab at this time            Education: Education Goals: Education classes will be provided on a weekly basis, covering required topics. Participant will state understanding/return demonstration of topics presented.     Core Videos: Exercise    Move It!  Clinical staff conducted group or individual video education with verbal and written material and guidebook.  Patient  learns the recommended Pritikin exercise program. Exercise with the goal of living a long, healthy life. Some of the health benefits of exercise include controlled diabetes, healthier  blood pressure levels, improved cholesterol levels, improved heart and lung capacity, improved sleep, and better body composition. Everyone should speak with their doctor before starting or changing an exercise routine.  Biomechanical Limitations Clinical staff conducted group or individual video education with verbal and written material and guidebook.  Patient learns how biomechanical limitations can impact exercise and how we can mitigate and possibly overcome limitations to have an impactful and balanced exercise routine.  Body Composition Clinical staff conducted group or individual video education with verbal and written material and guidebook.  Patient learns that body composition (ratio of muscle mass to fat mass) is a key component to assessing overall fitness, rather than body weight alone. Increased fat mass, especially visceral belly fat, can put us  at increased risk for metabolic syndrome, type 2 diabetes, heart disease, and even death. It is recommended to combine diet and exercise (cardiovascular and resistance training) to improve your body composition. Seek guidance from your physician and exercise physiologist before implementing an exercise routine.  Exercise Action Plan Clinical staff conducted group or individual video education with verbal and written material and guidebook.  Patient learns the recommended strategies to achieve and enjoy long-term exercise adherence, including variety, self-motivation, self-efficacy, and positive decision making. Benefits of exercise include fitness, good health, weight management, more energy, better sleep, less stress, and overall well-being.  Medical   Heart Disease Risk Reduction Clinical staff conducted group or individual video education with verbal and  written material and guidebook.  Patient learns our heart is our most vital organ as it circulates oxygen, nutrients, white blood cells, and hormones throughout the entire body, and carries waste away. Data supports a plant-based eating plan like the Pritikin Program for its effectiveness in slowing progression of and reversing heart disease. The video provides a number of recommendations to address heart disease.   Metabolic Syndrome and Belly Fat  Clinical staff conducted group or individual video education with verbal and written material and guidebook.  Patient learns what metabolic syndrome is, how it leads to heart disease, and how one can reverse it and keep it from coming back. You have metabolic syndrome if you have 3 of the following 5 criteria: abdominal obesity, high blood pressure, high triglycerides, low HDL cholesterol, and high blood sugar.  Hypertension and Heart Disease Clinical staff conducted group or individual video education with verbal and written material and guidebook.  Patient learns that high blood pressure, or hypertension, is very common in the [place] . Hypertension is largely due to excessive salt intake, but other important risk factors include being overweight, physical inactivity, drinking too much alcohol, smoking, and not eating enough potassium from fruits and vegetables. High blood pressure is a leading risk factor for heart attack, stroke, congestive heart failure, dementia, kidney failure, and premature death. Long-term effects of excessive salt intake include stiffening of the arteries and thickening of heart muscle and organ damage. Recommendations include ways to reduce hypertension and the risk of heart disease.  Diseases of Our Time - Focusing on Diabetes Clinical staff conducted group or individual video education with verbal and written material and guidebook.  Patient learns why the best way to stop diseases of our time is prevention, through food  and other lifestyle changes. Medicine (such as prescription pills and surgeries) is often only a Band-Aid on the problem, not a long-term solution. Most common diseases of our time include obesity, type 2 diabetes, hypertension, heart disease, and cancer. The Pritikin Program is recommended and has been proven to  help reduce, reverse, and/or prevent the damaging effects of metabolic syndrome.  Nutrition   Overview of the Pritikin Eating Plan  Clinical staff conducted group or individual video education with verbal and written material and guidebook.  Patient learns about the Pritikin Eating Plan for disease risk reduction. The Pritikin Eating Plan emphasizes a wide variety of unrefined, minimally-processed carbohydrates, like fruits, vegetables, whole grains, and legumes. Go, Caution, and Stop food choices are explained. Plant-based and lean animal proteins are emphasized. Rationale provided for low sodium intake for blood pressure control, low added sugars for blood sugar stabilization, and low added fats and oils for coronary artery disease risk reduction and weight management.  Calorie Density  Clinical staff conducted group or individual video education with verbal and written material and guidebook.  Patient learns about calorie density and how it impacts the Pritikin Eating Plan. Knowing the characteristics of the food you choose will help you decide whether those foods will lead to weight gain or weight loss, and whether you want to consume more or less of them. Weight loss is usually a side effect of the Pritikin Eating Plan because of its focus on low calorie-dense foods.  Label Reading  Clinical staff conducted group or individual video education with verbal and written material and guidebook.  Patient learns about the Pritikin recommended label reading guidelines and corresponding recommendations regarding calorie density, added sugars, sodium content, and whole grains.  Dining Out - Part 1   Clinical staff conducted group or individual video education with verbal and written material and guidebook.  Patient learns that restaurant meals can be sabotaging because they can be so high in calories, fat, sodium, and/or sugar. Patient learns recommended strategies on how to positively address this and avoid unhealthy pitfalls.  Facts on Fats  Clinical staff conducted group or individual video education with verbal and written material and guidebook.  Patient learns that lifestyle modifications can be just as effective, if not more so, as many medications for lowering your risk of heart disease. A Pritikin lifestyle can help to reduce your risk of inflammation and atherosclerosis (cholesterol build-up, or plaque, in the artery walls). Lifestyle interventions such as dietary choices and physical activity address the cause of atherosclerosis. A review of the types of fats and their impact on blood cholesterol levels, along with dietary recommendations to reduce fat intake is also included.  Nutrition Action Plan  Clinical staff conducted group or individual video education with verbal and written material and guidebook.  Patient learns how to incorporate Pritikin recommendations into their lifestyle. Recommendations include planning and keeping personal health goals in mind as an important part of their success.  Healthy Mind-Set    Healthy Minds, Bodies, Hearts  Clinical staff conducted group or individual video education with verbal and written material and guidebook.  Patient learns how to identify when they are stressed. Video will discuss the impact of that stress, as well as the many benefits of stress management. Patient will also be introduced to stress management techniques. The way we think, act, and feel has an impact on our hearts.  How Our Thoughts Can Heal Our Hearts  Clinical staff conducted group or individual video education with verbal and written material and guidebook.   Patient learns that negative thoughts can cause depression and anxiety. This can result in negative lifestyle behavior and serious health problems. Cognitive behavioral therapy is an effective method to help control our thoughts in order to change and improve our emotional outlook.  Additional  Videos:  Exercise    Improving Performance  Clinical staff conducted group or individual video education with verbal and written material and guidebook.  Patient learns to use a non-linear approach by alternating intensity levels and lengths of time spent exercising to help burn more calories and lose more body fat. Cardiovascular exercise helps improve heart health, metabolism, hormonal balance, blood sugar control, and recovery from fatigue. Resistance training improves strength, endurance, balance, coordination, reaction time, metabolism, and muscle mass. Flexibility exercise improves circulation, posture, and balance. Seek guidance from your physician and exercise physiologist before implementing an exercise routine and learn your capabilities and proper form for all exercise.  Introduction to Yoga  Clinical staff conducted group or individual video education with verbal and written material and guidebook.  Patient learns about yoga, a discipline of the coming together of mind, breath, and body. The benefits of yoga include improved flexibility, improved range of motion, better posture and core strength, increased lung function, weight loss, and positive self-image. Yoga's heart health benefits include lowered blood pressure, healthier heart rate, decreased cholesterol and triglyceride levels, improved immune function, and reduced stress. Seek guidance from your physician and exercise physiologist before implementing an exercise routine and learn your capabilities and proper form for all exercise.  Medical   Aging: Enhancing Your Quality of Life  Clinical staff conducted group or individual video education  with verbal and written material and guidebook.  Patient learns key strategies and recommendations to stay in good physical health and enhance quality of life, such as prevention strategies, having an advocate, securing a Health Care Proxy and Power of Attorney, and keeping a list of medications and system for tracking them. It also discusses how to avoid risk for bone loss.  Biology of Weight Control  Clinical staff conducted group or individual video education with verbal and written material and guidebook.  Patient learns that weight gain occurs because we consume more calories than we burn (eating more, moving less). Even if your body weight is normal, you may have higher ratios of fat compared to muscle mass. Too much body fat puts you at increased risk for cardiovascular disease, heart attack, stroke, type 2 diabetes, and obesity-related cancers. In addition to exercise, following the Pritikin Eating Plan can help reduce your risk.  Decoding Lab Results  Clinical staff conducted group or individual video education with verbal and written material and guidebook.  Patient learns that lab test reflects one measurement whose values change over time and are influenced by many factors, including medication, stress, sleep, exercise, food, hydration, pre-existing medical conditions, and more. It is recommended to use the knowledge from this video to become more involved with your lab results and evaluate your numbers to speak with your doctor.   Diseases of Our Time - Overview  Clinical staff conducted group or individual video education with verbal and written material and guidebook.  Patient learns that according to the CDC, 50% to 70% of chronic diseases (such as obesity, type 2 diabetes, elevated lipids, hypertension, and heart disease) are avoidable through lifestyle improvements including healthier food choices, listening to satiety cues, and increased physical activity.  Sleep  Disorders Clinical staff conducted group or individual video education with verbal and written material and guidebook.  Patient learns how good quality and duration of sleep are important to overall health and well-being. Patient also learns about sleep disorders and how they impact health along with recommendations to address them, including discussing with a physician.  Nutrition  Dining Out -  Part 2 Clinical staff conducted group or individual video education with verbal and written material and guidebook.  Patient learns how to plan ahead and communicate in order to maximize their dining experience in a healthy and nutritious manner. Included are recommended food choices based on the type of restaurant the patient is visiting.   Fueling a Banker conducted group or individual video education with verbal and written material and guidebook.  There is a strong connection between our food choices and our health. Diseases like obesity and type 2 diabetes are very prevalent and are in large-part due to lifestyle choices. The Pritikin Eating Plan provides plenty of food and hunger-curbing satisfaction. It is easy to follow, affordable, and helps reduce health risks.  Menu Workshop  Clinical staff conducted group or individual video education with verbal and written material and guidebook.  Patient learns that restaurant meals can sabotage health goals because they are often packed with calories, fat, sodium, and sugar. Recommendations include strategies to plan ahead and to communicate with the manager, chef, or server to help order a healthier meal.  Planning Your Eating Strategy  Clinical staff conducted group or individual video education with verbal and written material and guidebook.  Patient learns about the Pritikin Eating Plan and its benefit of reducing the risk of disease. The Pritikin Eating Plan does not focus on calories. Instead, it emphasizes high-quality,  nutrient-rich foods. By knowing the characteristics of the foods, we choose, we can determine their calorie density and make informed decisions.  Targeting Your Nutrition Priorities  Clinical staff conducted group or individual video education with verbal and written material and guidebook.  Patient learns that lifestyle habits have a tremendous impact on disease risk and progression. This video provides eating and physical activity recommendations based on your personal health goals, such as reducing LDL cholesterol, losing weight, preventing or controlling type 2 diabetes, and reducing high blood pressure.  Vitamins and Minerals  Clinical staff conducted group or individual video education with verbal and written material and guidebook.  Patient learns different ways to obtain key vitamins and minerals, including through a recommended healthy diet. It is important to discuss all supplements you take with your doctor.   Healthy Mind-Set    Smoking Cessation  Clinical staff conducted group or individual video education with verbal and written material and guidebook.  Patient learns that cigarette smoking and tobacco addiction pose a serious health risk which affects millions of people. Stopping smoking will significantly reduce the risk of heart disease, lung disease, and many forms of cancer. Recommended strategies for quitting are covered, including working with your doctor to develop a successful plan.  Culinary   Becoming a Set designer conducted group or individual video education with verbal and written material and guidebook.  Patient learns that cooking at home can be healthy, cost-effective, quick, and puts them in control. Keys to cooking healthy recipes will include looking at your recipe, assessing your equipment needs, planning ahead, making it simple, choosing cost-effective seasonal ingredients, and limiting the use of added fats, salts, and sugars.  Cooking -  Breakfast and Snacks  Clinical staff conducted group or individual video education with verbal and written material and guidebook.  Patient learns how important breakfast is to satiety and nutrition through the entire day. Recommendations include key foods to eat during breakfast to help stabilize blood sugar levels and to prevent overeating at meals later in the day. Planning ahead is also a key  component.  Cooking - Educational psychologist conducted group or individual video education with verbal and written material and guidebook.  Patient learns eating strategies to improve overall health, including an approach to cook more at home. Recommendations include thinking of animal protein as a side on your plate rather than center stage and focusing instead on lower calorie dense options like vegetables, fruits, whole grains, and plant-based proteins, such as beans. Making sauces in large quantities to freeze for later and leaving the skin on your vegetables are also recommended to maximize your experience.  Cooking - Healthy Salads and Dressing Clinical staff conducted group or individual video education with verbal and written material and guidebook.  Patient learns that vegetables, fruits, whole grains, and legumes are the foundations of the Pritikin Eating Plan. Recommendations include how to incorporate each of these in flavorful and healthy salads, and how to create homemade salad dressings. Proper handling of ingredients is also covered. Cooking - Soups and [employer] - Soups and Desserts Clinical staff conducted group or individual video education with verbal and written material and guidebook.  Patient learns that Pritikin soups and desserts make for easy, nutritious, and delicious snacks and meal components that are low in sodium, fat, sugar, and calorie density, while high in vitamins, minerals, and filling fiber. Recommendations include simple and healthy ideas for soups and  desserts.   Overview     The Pritikin Solution Program Overview Clinical staff conducted group or individual video education with verbal and written material and guidebook.  Patient learns that the results of the Pritikin Program have been documented in more than 100 articles published in peer-reviewed journals, and the benefits include reducing risk factors for (and, in some cases, even reversing) high cholesterol, high blood pressure, type 2 diabetes, obesity, and more! An overview of the three key pillars of the Pritikin Program will be covered: eating well, doing regular exercise, and having a healthy mind-set.  WORKSHOPS  Exercise: Exercise Basics: Building Your Action Plan Clinical staff led group instruction and group discussion with PowerPoint presentation and patient guidebook. To enhance the learning environment the use of posters, models and videos may be added. At the conclusion of this workshop, patients will comprehend the difference between physical activity and exercise, as well as the benefits of incorporating both, into their routine. Patients will understand the FITT (Frequency, Intensity, Time, and Type) principle and how to use it to build an exercise action plan. In addition, safety concerns and other considerations for exercise and cardiac rehab will be addressed by the presenter. The purpose of this lesson is to promote a comprehensive and effective weekly exercise routine in order to improve patients' overall level of fitness.   Managing Heart Disease: Your Path to a Healthier Heart Clinical staff led group instruction and group discussion with PowerPoint presentation and patient guidebook. To enhance the learning environment the use of posters, models and videos may be added.At the conclusion of this workshop, patients will understand the anatomy and physiology of the heart. Additionally, they will understand how Pritikin's three pillars impact the risk factors, the  progression, and the management of heart disease.  The purpose of this lesson is to provide a high-level overview of the heart, heart disease, and how the Pritikin lifestyle positively impacts risk factors.  Exercise Biomechanics Clinical staff led group instruction and group discussion with PowerPoint presentation and patient guidebook. To enhance the learning environment the use of posters, models and videos may be added.  Patients will learn how the structural parts of their bodies function and how these functions impact their daily activities, movement, and exercise. Patients will learn how to promote a neutral spine, learn how to manage pain, and identify ways to improve their physical movement in order to promote healthy living. The purpose of this lesson is to expose patients to common physical limitations that impact physical activity. Participants will learn practical ways to adapt and manage aches and pains, and to minimize their effect on regular exercise. Patients will learn how to maintain good posture while sitting, walking, and lifting.  Balance Training and Fall Prevention  Clinical staff led group instruction and group discussion with PowerPoint presentation and patient guidebook. To enhance the learning environment the use of posters, models and videos may be added. At the conclusion of this workshop, patients will understand the importance of their sensorimotor skills (vision, proprioception, and the vestibular system) in maintaining their ability to balance as they age. Patients will apply a variety of balancing exercises that are appropriate for their current level of function. Patients will understand the common causes for poor balance, possible solutions to these problems, and ways to modify their physical environment in order to minimize their fall risk. The purpose of this lesson is to teach patients about the importance of maintaining balance as they age and ways to  minimize their risk of falling.  WORKSHOPS   Nutrition:  Fueling a Ship broker led group instruction and group discussion with PowerPoint presentation and patient guidebook. To enhance the learning environment the use of posters, models and videos may be added. Patients will review the foundational principles of the Pritikin Eating Plan and understand what constitutes a serving size in each of the food groups. Patients will also learn Pritikin-friendly foods that are better choices when away from home and review make-ahead meal and snack options. Calorie density will be reviewed and applied to three nutrition priorities: weight maintenance, weight loss, and weight gain. The purpose of this lesson is to reinforce (in a group setting) the key concepts around what patients are recommended to eat and how to apply these guidelines when away from home by planning and selecting Pritikin-friendly options. Patients will understand how calorie density may be adjusted for different weight management goals.  Mindful Eating  Clinical staff led group instruction and group discussion with PowerPoint presentation and patient guidebook. To enhance the learning environment the use of posters, models and videos may be added. Patients will briefly review the concepts of the Pritikin Eating Plan and the importance of low-calorie dense foods. The concept of mindful eating will be introduced as well as the importance of paying attention to internal hunger signals. Triggers for non-hunger eating and techniques for dealing with triggers will be explored. The purpose of this lesson is to provide patients with the opportunity to review the basic principles of the Pritikin Eating Plan, discuss the value of eating mindfully and how to measure internal cues of hunger and fullness using the Hunger Scale. Patients will also discuss reasons for non-hunger eating and learn strategies to use for controlling emotional  eating.  Targeting Your Nutrition Priorities Clinical staff led group instruction and group discussion with PowerPoint presentation and patient guidebook. To enhance the learning environment the use of posters, models and videos may be added. Patients will learn how to determine their genetic susceptibility to disease by reviewing their family history. Patients will gain insight into the importance of diet as part of an overall  healthy lifestyle in mitigating the impact of genetics and other environmental insults. The purpose of this lesson is to provide patients with the opportunity to assess their personal nutrition priorities by looking at their family history, their own health history and current risk factors. Patients will also be able to discuss ways of prioritizing and modifying the Pritikin Eating Plan for their highest risk areas  Menu  Clinical staff led group instruction and group discussion with PowerPoint presentation and patient guidebook. To enhance the learning environment the use of posters, models and videos may be added. Using menus brought in from [employer], or printed from [employer], patients will apply the Pritikin dining out guidelines that were presented in the [employer] video. Patients will also be able to practice these guidelines in a variety of provided scenarios. The purpose of this lesson is to provide patients with the opportunity to practice hands-on learning of the Pritikin Dining Out guidelines with actual menus and practice scenarios.  Label Reading Clinical staff led group instruction and group discussion with PowerPoint presentation and patient guidebook. To enhance the learning environment the use of posters, models and videos may be added. Patients will review and discuss the Pritikin label reading guidelines presented in Pritikin's Label Reading Educational series video. Using fool labels brought in from local grocery stores and  markets, patients will apply the label reading guidelines and determine if the packaged food meet the Pritikin guidelines. The purpose of this lesson is to provide patients with the opportunity to review, discuss, and practice hands-on learning of the Pritikin Label Reading guidelines with actual packaged food labels. Cooking School  Pritikin's [employer] are designed to teach patients ways to prepare quick, simple, and affordable recipes at home. The importance of nutrition's role in chronic disease risk reduction is reflected in its emphasis in the overall Pritikin program. By learning how to prepare essential core Pritikin Eating Plan recipes, patients will increase control over what they eat; be able to customize the flavor of foods without the use of added salt, sugar, or fat; and improve the quality of the food they consume. By learning a set of core recipes which are easily assembled, quickly prepared, and affordable, patients are more likely to prepare more healthy foods at home. These workshops focus on convenient breakfasts, simple entres, side dishes, and desserts which can be prepared with minimal effort and are consistent with nutrition recommendations for cardiovascular risk reduction. Cooking [employer] are taught by a Armed forces logistics/support/administrative officer (RD) who has been trained by the [employer]. The chef or RD has a clear understanding of the importance of minimizing - if not completely eliminating - added fat, sugar, and sodium in recipes. Throughout the series of Cooking School Workshop sessions, patients will learn about healthy ingredients and efficient methods of cooking to build confidence in their capability to prepare    Cooking School weekly topics:  Adding Flavor- Sodium-Free  Fast and Healthy Breakfasts  Powerhouse Plant-Based Proteins  Satisfying Salads and Dressings  Simple Sides and Sauces  International Cuisine-Spotlight on the [employer]  Zones  Delicious Desserts  Savory Soups  [employer] - Meals in a Astronomer Appetizers and Snacks  Comforting Weekend Breakfasts  One-Pot Wonders   Fast Evening Meals  Landscape architect Your Pritikin Plate  WORKSHOPS   Healthy Mindset (Psychosocial):  Focused Goals, Sustainable Changes Clinical staff led group instruction and group discussion with PowerPoint presentation and patient guidebook. To  enhance the learning environment the use of posters, models and videos may be added. Patients will be able to apply effective goal setting strategies to establish at least one personal goal, and then take consistent, meaningful action toward that goal. They will learn to identify common barriers to achieving personal goals and develop strategies to overcome them. Patients will also gain an understanding of how our mind-set can impact our ability to achieve goals and the importance of cultivating a positive and growth-oriented mind-set. The purpose of this lesson is to provide patients with a deeper understanding of how to set and achieve personal goals, as well as the tools and strategies needed to overcome common obstacles which may arise along the way.  From Head to Heart: The Power of a Healthy Outlook  Clinical staff led group instruction and group discussion with PowerPoint presentation and patient guidebook. To enhance the learning environment the use of posters, models and videos may be added. Patients will be able to recognize and describe the impact of emotions and mood on physical health. They will discover the importance of self-care and explore self-care practices which may work for them. Patients will also learn how to utilize the 4 C's to cultivate a healthier outlook and better manage stress and challenges. The purpose of this lesson is to demonstrate to patients how a healthy outlook is an essential part of maintaining good health, especially as they continue their  cardiac rehab journey.  Healthy Sleep for a Healthy Heart Clinical staff led group instruction and group discussion with PowerPoint presentation and patient guidebook. To enhance the learning environment the use of posters, models and videos may be added. At the conclusion of this workshop, patients will be able to demonstrate knowledge of the importance of sleep to overall health, well-being, and quality of life. They will understand the symptoms of, and treatments for, common sleep disorders. Patients will also be able to identify daytime and nighttime behaviors which impact sleep, and they will be able to apply these tools to help manage sleep-related challenges. The purpose of this lesson is to provide patients with a general overview of sleep and outline the importance of quality sleep. Patients will learn about a few of the most common sleep disorders. Patients will also be introduced to the concept of "sleep hygiene," and discover ways to self-manage certain sleeping problems through simple daily behavior changes. Finally, the workshop will motivate patients by clarifying the links between quality sleep and their goals of heart-healthy living.   Recognizing and Reducing Stress Clinical staff led group instruction and group discussion with PowerPoint presentation and patient guidebook. To enhance the learning environment the use of posters, models and videos may be added. At the conclusion of this workshop, patients will be able to understand the types of stress reactions, differentiate between acute and chronic stress, and recognize the impact that chronic stress has on their health. They will also be able to apply different coping mechanisms, such as reframing negative self-talk. Patients will have the opportunity to practice a variety of stress management techniques, such as deep abdominal breathing, progressive muscle relaxation, and/or guided imagery.  The purpose of this lesson is to educate  patients on the role of stress in their lives and to provide healthy techniques for coping with it.  Learning Barriers/Preferences:  Learning Barriers/Preferences - 12/15/23 1029       Learning Barriers/Preferences   Learning Barriers Exercise Concerns   Chronic lower back pain,  occasional lightheaded when changing postions  Learning Preferences Pictoral;Skilled Demonstration             Education Topics:  Knowledge Questionnaire Score:  Knowledge Questionnaire Score - 12/15/23 1152       Knowledge Questionnaire Score   Pre Score 23/24             Core Components/Risk Factors/Patient Goals at Admission:  Personal Goals and Risk Factors at Admission - 12/15/23 1013       Core Components/Risk Factors/Patient Goals on Admission    Weight Management Weight Loss;Yes    Intervention Weight Management: Provide education and appropriate resources to help participant work on and attain dietary goals.;Weight Management: Develop a combined nutrition and exercise program designed to reach desired caloric intake, while maintaining appropriate intake of nutrient and fiber, sodium and fats, and appropriate energy expenditure required for the weight goal.    Admit Weight 183 lb 1.6 oz (83.1 kg)    Goal Weight: Short Term 180 lb (81.6 kg)    Goal Weight: Long Term 170 lb (77.1 kg)    Expected Outcomes Short Term: Continue to assess and modify interventions until short term weight is achieved;Long Term: Adherence to nutrition and physical activity/exercise program aimed toward attainment of established weight goal;Weight Loss: Understanding of general recommendations for a balanced deficit meal plan, which promotes 1-2 lb weight loss per week and includes a negative energy balance of [phone] kcal/d    Hypertension Yes    Intervention Provide education on lifestyle modifcations including regular physical activity/exercise, weight management, moderate sodium restriction and increased  consumption of fresh fruit, vegetables, and low fat dairy, alcohol moderation, and smoking cessation.;Monitor prescription use compliance.    Expected Outcomes Short Term: Continued assessment and intervention until BP is < 140/60mm HG in hypertensive participants. < 130/34mm HG in hypertensive participants with diabetes, heart failure or chronic kidney disease.;Long Term: Maintenance of blood pressure at goal levels.    Lipids Yes    Intervention Provide education and support for participant on nutrition & aerobic/resistive exercise along with prescribed medications to achieve LDL 70mg , HDL >40mg .    Expected Outcomes Short Term: Participant states understanding of desired cholesterol values and is compliant with medications prescribed. Participant is following exercise prescription and nutrition guidelines.;Long Term: Cholesterol controlled with medications as prescribed, with individualized exercise RX and with personalized nutrition plan. Value goals: LDL < 70mg , HDL > 40 mg.    Stress Yes    Intervention Offer individual and/or small group education and counseling on adjustment to heart disease, stress management and health-related lifestyle change. Teach and support self-help strategies.;Refer participants experiencing significant psychosocial distress to appropriate mental health specialists for further evaluation and treatment. When possible, include family members and significant others in education/counseling sessions.    Expected Outcomes Short Term: Participant demonstrates changes in health-related behavior, relaxation and other stress management skills, ability to obtain effective social support, and compliance with psychotropic medications if prescribed.;Long Term: Emotional wellbeing is indicated by absence of clinically significant psychosocial distress or social isolation.    Personal Goal Other Yes    Personal Goal Decrease BMI. Maintain weight between 170-180 lbs. Eat more fruits and  vegetables, less salt. Reduce/ remove anxiety medication. Maintain or reduce depression medication. Meditate more.    Intervention [name] will attend Exercise, Nutrtion, and Healthy Mind-Set workshops to help achieve personal health and fitness goals.    Expected Outcomes Tommy will achieve and maintain weight goal. He will know and practice meditation techniques. He will incorporate more fruits and vegetables in  his diet. He will develop and Exercise Action Plan he can maintain to increase strength and stamina as measured by self-report and grip strenght and walk tests.             Core Components/Risk Factors/Patient Goals Review:    Core Components/Risk Factors/Patient Goals at Discharge (Final Review):    ITP Comments:  ITP Comments     Row Name 12/15/23 0917           ITP Comments Medical Director- Dr. Gaylyn Keas, MD. Introduction to the [facility] / Intensive Cardiac Rehab Program. Reviewed initial orientation folder with Tommy.                Comments: Tommy attended orientation for the cardiac rehabilitation program on  12/15/2023  to perform initial intake and exercise walk test. He was introduced to the [employer] education and orientation packet was reviewed. Completed 6-minute walk test, measurements, initial ITP, and exercise prescription. Vital signs stable. Telemetry-normal sinus rhythm with T-wave inversion, mild shortness of breath at the end of the walk test.   Service time was from 917 to 1108. Doree Games, MS, ACSM CEP 12/15/2023 1201

## 2023-12-15 NOTE — Progress Notes (Signed)
 Cardiac Rehab Medication Review by a Nurse  Does the patient  feel that his/her medications are working for him/her?  yes  Has the patient been experiencing any side effects to the medications prescribed?  yes  Does the patient measure his/her own blood pressure or blood glucose at home?  yes   Does the patient have any problems obtaining medications due to transportation or finances?   no  Understanding of regimen: excellent Understanding of indications: excellent Potential of compliance: excellent     comments: [name] is taking his medications as prescribed and has a good understanding of what his medications are for. [name] checks his CBG's twice a day and blood pressures twice a day. [name]'s aspirin is currently on hold due to a nosebleed for 30 days.[name] Antonio RN BSN     [name] 12/15/2023 10:17 AM

## 2023-12-22 ENCOUNTER — Telehealth: Payer: Self-pay

## 2023-12-22 NOTE — Transitions of Care (Post Inpatient/ED Visit) (Signed)
   12/22/2023  Name: [name] MRN: [id number] DOB: Aug 17, 1976  Today's TOC FU Call Status: Today's TOC FU Call Status:: Unsuccessful Call (1st Attempt) Unsuccessful Call (1st Attempt) Date: 12/22/23  Attempted to reach the patient regarding the most recent Inpatient/ED visit.  Follow Up Plan: Additional outreach attempts will be made to reach the patient to complete the Transitions of Care (Post Inpatient/ED visit) call.   Brown Cape, RN, BSN, CCM [facility], [facility] RN Care Manager Direct Dial: [phone]

## 2023-12-23 ENCOUNTER — Encounter (HOSPITAL_COMMUNITY)
Admission: RE | Admit: 2023-12-23 | Discharge: 2023-12-23 | Disposition: A | Discharge status: Home / Self Care | Source: Ambulatory Visit | Attending: Cardiology | Admitting: Cardiology

## 2023-12-23 ENCOUNTER — Telehealth: Payer: Self-pay

## 2023-12-23 DIAGNOSIS — Z941 Heart transplant status: Secondary | ICD-10-CM | POA: Insufficient documentation

## 2023-12-23 NOTE — Transitions of Care (Post Inpatient/ED Visit) (Signed)
 12/23/2023  Name: [name] MRN: [id number] DOB: 06-01-1976  Today's TOC FU Call Status: Today's TOC FU Call Status:: Successful TOC FU Call Completed Patient's Name and Date of Birth confirmed.  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 Sig Documenting Provider Last Dose Status Informant  acetaminophen  (TYLENOL ) 500 MG tablet [id number] Yes Take 500 mg by mouth 2 (two) times daily. 2 tablets twice a day [provider] Taking Active   ASPIRIN LOW DOSE 81 MG tablet [id number] No Take 81 mg by mouth daily.  Patient not taking: Reported on 12/23/2023   [provider] Not Taking Active            Med Note [facility], MARIA W   Tue Dec 15, 2023  9:44 AM) Not taking for 30 days due to nosebleed   Calcium  Citrate-Vitamin D  (CITRACAL + D PO) [id number] Yes Take 1 tablet by mouth 2 (two) times daily. [provider] Taking Active Self  Cholecalciferol  1.25 MG (50000 UT) capsule [id number] No Take 1 capsule by mouth once a week.  Patient not taking: Reported on 12/23/2023   [provider] Not Taking Active   fexofenadine (ALLEGRA) 180 MG tablet [id number] Yes Take 180 mg by mouth  daily. [provider] Taking Active   Fingerstix Lancets MISC [id number] Yes 1 each by Other route 2 (two) times daily. [provider] Taking Active   mirtazapine (REMERON) 7.5 MG tablet [id number] Yes Take 7.5 mg by mouth at bedtime. [provider] Taking Active   montelukast  (SINGULAIR ) 10 MG tablet [id number] Yes TAKE 1 TABLET BY MOUTH EVERYDAY AT BEDTIME [name], Manley Seeds, MD Taking Active Self  Multiple Vitamin (MULITIVITAMIN WITH MINERALS) TABS [id number] Yes Take 1 tablet by mouth daily. [provider] Taking Active Self  mycophenolate (CELLCEPT) 250 MG capsule [id number] Yes Take 250 mg by mouth 2 (two) times daily. 6 capsules twice a day [provider] Taking Active   pantoprazole (PROTONIX) 40 MG tablet [id number] Yes Take 40 mg by mouth daily. [provider] Taking Active   polyethylene glycol (MIRALAX  / GLYCOLAX ) 17 g packet [id number] Yes Take 17 g by mouth daily as needed for moderate constipation. [provider] Taking Active            Med Note Miriam American, Aundra Lee Dec 23, 2023  3:16 PM) As needed  potassium chloride  SA (KLOR-CON  M) 20 MEQ tablet [id number] Yes Take 20 mEq by mouth once. [provider] Taking Active   PRECISION QID TEST test strip [id number] Yes 1 strip by Other route in the morning and at bedtime. [provider] Taking Active   predniSONE  (DELTASONE ) 5 MG tablet [id number] Yes Take 15 mg by mouth daily with breakfast. [provider] Taking Active   rosuvastatin (CRESTOR) 10 MG tablet  [id number] Yes Take 10 mg by mouth daily. [provider] Taking Active   senna-docusate (SENOKOT-S) 8.6-50 MG tablet [id number] Yes Take 1 tablet by mouth 2 (two) times daily. [provider] Taking Active            Med Note Miriam American, Aundra Lee Dec 23, 2023  3:16 PM) As needed  sertraline  (ZOLOFT ) 50 MG tablet [id number] Yes Take 75 mg by mouth daily. [provider]  Taking Active Self  sulfamethoxazole-trimethoprim (BACTRIM) 400-80 MG tablet [id number] Yes Take 1 tablet by mouth daily. [provider] Taking Active   tacrolimus (PROGRAF) 1 MG capsule [id number] Yes Take 1 mg by mouth 2 (two) times daily. 5 tablets in the morning and 4 tablets at night [provider] Taking Active   tadalafil (CIALIS) 5 MG tablet [id number] Yes Take 5 mg by mouth daily as needed for erectile dysfunction. [provider] Taking Active Self           Med Note Miriam American, Aundra Lee Dec 23, 2023  3:17 PM) As needed  traZODone  (DESYREL ) 50 MG tablet [id number] Yes Take 25 mg by mouth at bedtime as needed. [provider] Taking Active   valGANciclovir (VALCYTE) 450 MG tablet [id number] Yes Take 900 mg by mouth daily. [provider] Taking Active               Follow up appointments reviewed: States cardiac rehab 3 times a week and seeing cardiologist every other week    SDOH Interventions Today    Flowsheet Row Most Recent Value  SDOH Interventions   Food Insecurity Interventions Intervention Not Indicated  Housing Interventions Intervention Not Indicated  Transportation Interventions Intervention Not Indicated  Utilities Interventions Intervention Not Indicated      Brown Cape, RN, BSN, CCM [place]  [facility], [facility] RN Care Manager Direct Dial: [phone]

## 2023-12-23 NOTE — Progress Notes (Signed)
 Daily Session Note  Patient Details  Name: RAEL PHI MRN: [id number] Date of Birth: Mar 10, 1976 Referring Provider:   Flowsheet Row INTENSIVE CARDIAC REHAB ORIENT from 12/15/2023 in [facility]  Referring Provider Floria Hurst, MD [facility])  Jacqueline Matsu, MD (coverage)]       Encounter Date: 12/23/2023  Check In:  Session Check In - 12/23/23 1041       Check-In   Supervising physician immediately available to respond to emergencies [facility] MD immediately available    Physician(s) Slater Duncan, NP    Location [facility]    Staff Present Hilbert Loving, MS, ACSM-CEP, Exercise Physiologist;Siobhan Zaro, RN, Emerick Hanlon, MS, Exercise Physiologist;David Vernel Golds, MS, ACSM-CEP, CCRP, Exercise Physiologist;Johnny Alexia Angelucci, MS, Exercise Physiologist    Virtual Visit No    Medication changes reported     No    Fall or balance concerns reported    No    Tobacco Cessation No Change    Warm-up and Cool-down Performed as group-led instruction    Resistance Training Performed No    VAD Patient? No    PAD/SET Patient? No      Pain Assessment   Currently in Pain? No/denies    Pain Score 0-No pain    Pain Onset More than a month ago    Multiple Pain Sites No             Capillary Blood Glucose: No results found for this or any previous visit (from the past 24 hours).   Exercise Prescription Changes - 12/23/23 1029       Response to Exercise   Blood Pressure (Admit) 110/82    Blood Pressure (Exercise) 122/60    Blood Pressure (Exit) 102/60    Heart Rate (Admit) 81 bpm    Heart Rate (Exercise) 95 bpm    Heart Rate (Exit) 84 bpm    Rating of Perceived Exertion (Exercise) 11.5    Symptoms None    Comments Off to a good start with exercise.    Duration Continue with 30 min of aerobic exercise without signs/symptoms of physical distress.    Intensity THRR unchanged      Progression    Progression Continue to progress workloads to maintain intensity without signs/symptoms of physical distress.    Average METs 2.5      Resistance Training   Training Prescription No    Weight Relaxation day, no weights.      Interval Training   Interval Training No      Treadmill   MPH 2.5    Grade 0    Minutes 15    METs 2.91      NuStep   Level 2    SPM 80    Minutes 15    METs 2.1             Social History   Tobacco Use  Smoking Status Former   Current packs/day: 0.00   Average packs/day: 1 pack/day for 18.0 years (18.0 ttl pk-yrs)   Types: Cigarettes   Start date: 01/17/1990   Quit date: 08/19/2007   Years since quitting: 16.3  Smokeless Tobacco Never    Goals Met:  Exercise tolerated well No report of concerns or symptoms today  Goals Unmet:  Not Applicable  Comments: Pt started cardiac rehab today.  Pt tolerated light exercise without difficulty. VSS, telemetry-Sinus Rhythm, asymptomatic.  Medication list reconciled. Pt denies barriers to medicaiton compliance.  PSYCHOSOCIAL ASSESSMENT:  PHQ-5. Tommy admits to being depressed and has PTSD. [name]  is receiving  video counseling every 2-3 weeks    Pt enjoys spending time with his children.   Pt oriented to exercise equipment and routine.    Understanding verbalized. Received clearance to start exercise from transplant team at [facility] from weekend hospitalization.Monte Antonio RN BSN    Dr. Gaylyn Keas is Medical Director for Cardiac Rehab at [facility].

## 2023-12-25 ENCOUNTER — Encounter (HOSPITAL_COMMUNITY)
Admission: RE | Admit: 2023-12-25 | Discharge: 2023-12-25 | Disposition: A | Discharge status: Home / Self Care | Source: Ambulatory Visit | Attending: Cardiology | Admitting: Cardiology

## 2023-12-25 DIAGNOSIS — Z941 Heart transplant status: Secondary | ICD-10-CM | POA: Diagnosis not present

## 2023-12-28 ENCOUNTER — Encounter (HOSPITAL_COMMUNITY)
Admission: RE | Admit: 2023-12-28 | Discharge: 2023-12-28 | Disposition: A | Discharge status: Home / Self Care | Source: Ambulatory Visit | Attending: Cardiology | Admitting: Cardiology

## 2023-12-28 DIAGNOSIS — Z941 Heart transplant status: Secondary | ICD-10-CM | POA: Diagnosis not present

## 2023-12-30 ENCOUNTER — Encounter (HOSPITAL_COMMUNITY)
Admission: RE | Admit: 2023-12-30 | Discharge: 2023-12-30 | Disposition: A | Discharge status: Home / Self Care | Source: Ambulatory Visit | Attending: Cardiology | Admitting: Cardiology

## 2023-12-30 DIAGNOSIS — Z941 Heart transplant status: Secondary | ICD-10-CM

## 2024-01-01 ENCOUNTER — Encounter (HOSPITAL_COMMUNITY)

## 2024-01-04 ENCOUNTER — Encounter (HOSPITAL_COMMUNITY)
Admission: RE | Admit: 2024-01-04 | Discharge: 2024-01-04 | Disposition: A | Discharge status: Home / Self Care | Source: Ambulatory Visit | Attending: Cardiology | Admitting: Cardiology

## 2024-01-04 DIAGNOSIS — Z941 Heart transplant status: Secondary | ICD-10-CM | POA: Diagnosis not present

## 2024-01-05 NOTE — Progress Notes (Signed)
 QUALITY OF LIFE SCORE REVIEW  Pt completed Quality of Life survey as a participant in Cardiac Rehab.  Scores 21.0 or below are considered low.  Pt score very low in several areas Overall 16.49, Health and Function 11.3, socioeconomic 21.07, physiological and spiritual 15.07, family 27.6. Patient quality of life slightly altered by physical constraints which limits ability to perform as prior to recent cardiac illness.  [name] admits to being dissatisfied with his health due to his recent heart transplant surgery and is experiencing some depression. [name] says his still gets fatigued easily which is a challenge since he has a family with small children. [name] has good support from his wife. [name] is currently receiving  virtual counseling from  [facility]. [name] al;so sees a psychiatrist. [name] had a biopsy last week. [name] says his rejection risk has been downgraded which is reassuring for [name]. Offered emotional support and reassurance.  Will continue to monitor and intervene as necessary.  [name] does not want his quality of life forwarded at this time.Monte Antonio RN BSN

## 2024-01-06 ENCOUNTER — Encounter (HOSPITAL_COMMUNITY)
Admission: RE | Admit: 2024-01-06 | Discharge: 2024-01-06 | Disposition: A | Discharge status: Home / Self Care | Source: Ambulatory Visit | Attending: Cardiology | Admitting: Cardiology

## 2024-01-06 DIAGNOSIS — Z941 Heart transplant status: Secondary | ICD-10-CM

## 2024-01-06 NOTE — Progress Notes (Signed)
 Cardiac Individual Treatment Plan  Patient Details  Name: [name] MRN: [id number] Date of Birth: [birth year] Referring Provider:   Flowsheet Row INTENSIVE CARDIAC REHAB ORIENT from 12/15/2023 in [facility]  Referring Provider Floria Hurst, MD 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 2 tablets twice a day, Disp: , Rfl:    ASPIRIN LOW DOSE 81 MG tablet, Take 81 mg by mouth daily. (Patient not taking: Reported on 12/23/2023), Disp: , Rfl:    Calcium  Citrate-Vitamin D  (CITRACAL + D PO), Take 1 tablet by mouth 2 (two) times daily., Disp: , Rfl:    Cholecalciferol  1.25 MG (50000 UT) capsule, Take 1 capsule by mouth once a week. (Patient not taking: Reported on 12/23/2023), Disp: , Rfl:    fexofenadine (ALLEGRA) 180 MG tablet, Take 180 mg by mouth daily., Disp: , Rfl:    Fingerstix Lancets MISC, 1 each by Other route 2 (two) times daily., Disp: , Rfl:    mirtazapine (REMERON) 7.5 MG tablet, Take 7.5 mg by mouth at bedtime., Disp: , Rfl:    montelukast  (SINGULAIR ) 10 MG tablet, TAKE 1 TABLET BY MOUTH EVERYDAY AT BEDTIME, Disp: 90 tablet, Rfl: 2   Multiple Vitamin (MULITIVITAMIN WITH MINERALS) TABS, Take 1 tablet by mouth daily., Disp: , Rfl:    mycophenolate (CELLCEPT) 250 MG capsule, Take 250 mg by mouth 2 (two) times daily. 6 capsules twice a day, Disp: , Rfl:    pantoprazole (PROTONIX) 40 MG tablet, Take 40 mg by mouth daily., Disp: , Rfl:    polyethylene glycol (MIRALAX  / GLYCOLAX ) 17 g packet, Take 17 g by mouth daily as needed for  moderate constipation., Disp: , Rfl:    potassium chloride  SA (KLOR-CON  M) 20 MEQ tablet, Take 20 mEq by mouth once., Disp: , Rfl:    PRECISION QID TEST test strip, 1 strip by Other route in the morning and at bedtime., Disp: , Rfl:    predniSONE  (DELTASONE ) 5 MG tablet, Take 15 mg by mouth daily with breakfast., Disp: , Rfl:    rosuvastatin (CRESTOR) 10 MG tablet, Take 10 mg by mouth daily., Disp: , Rfl:    senna-docusate (SENOKOT-S) 8.6-50 MG tablet, Take 1 tablet by mouth 2 (two) times daily., Disp: , Rfl:    sertraline  (ZOLOFT ) 50 MG tablet, Take 75 mg by mouth daily., Disp: , Rfl:    sulfamethoxazole-trimethoprim (BACTRIM) 400-80 MG tablet, Take 1 tablet by mouth daily., Disp: , Rfl:    tacrolimus (PROGRAF) 1 MG capsule, Take 1 mg by mouth 2 (two) times daily. 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   Asthma when younger   Fatty liver    History of chicken pox    History of kidney stones    MDT DVFB1D1 Visia AF MRI VR  ICD (implantable cardiac defibrillator) in place    Osteopenia    Pancreatitis    S/P orthotopic heart transplant (HCC)    At [facility]   Sleep apnea    uses cpap   Ventricular tachycardia (HCC)    sotalol  therapy;  catheter ablation at [facility] 10/10 and [facility] 2013    Tobacco Use: Social History   Tobacco Use  Smoking Status Former   Current packs/day: 0.00   Average packs/day: 1 pack/day for 18.0 years (18.0 ttl pk-yrs)   Types:  Cigarettes   Start date: 01/17/1990   Quit date: 08/19/2007   Years since quitting: 16.3  Smokeless Tobacco Never    Labs: Review Flowsheet  More data exists      Latest Ref Rng & Units 09/05/2019 09/11/2020 09/17/2021 07/15/2023 10/21/2023  Labs for ITP Cardiac and Pulmonary Rehab  Cholestrol 0 - 200 mg/dL 161  096  045  - -  Direct LDL mg/dL 409.8  119.1  478.2  - -  HDL-C >39.00 mg/dL 95.62  13.08  65.78  - -  Trlycerides 0.0 - 149.0 mg/dL 469.6  295.2  841.3  - -  Hemoglobin A1c 4.6 - 6.5 % 5.6  5.5  5.8  - -  TCO2 22 - 32 mmol/L - - - 28  27     Capillary Blood Glucose: No results found for: "GLUCAP"   Exercise Target Goals: Exercise Program Goal: Individual exercise prescription set using results from initial 6 min walk test and THRR while considering  patient's activity barriers and safety.   Exercise Prescription Goal: Initial exercise prescription builds to 30-45 minutes a day of aerobic activity, 2-3 days per week.  Home exercise guidelines will be given to patient during program as part of exercise prescription that the participant will acknowledge.  Activity Barriers & Risk Stratification:  Activity Barriers & Cardiac Risk Stratification - 12/15/23 1005       Activity Barriers & Cardiac Risk Stratification   Activity Barriers Other (comment)    Comments Post-surgical pain: left rib cage across abdomen; hx kyphoplasty & laminectomy: chronic, mild, low back pain; osteoporosis.    Cardiac Risk Stratification High             6 Minute Walk:  6 Minute Walk     Row Name 12/15/23 1055         6 Minute Walk   Phase Initial     Distance 1321 feet     Walk Time 6 minutes     # of Rest Breaks 0     MPH 2.5     METS 4.37     RPE 10     Perceived Dyspnea  1     VO2 Peak 15.29     Symptoms Yes (comment)     Comments Mild shortness of breath     Resting HR 86 bpm     Resting BP 124/78     Resting Oxygen Saturation  98 %     Exercise Oxygen Saturation  during 6  min walk 98 %     Max Ex. HR 91 bpm  Max Ex. BP 140/82     2 Minute Post BP 136/80              Oxygen Initial Assessment:   Oxygen Re-Evaluation:   Oxygen Discharge (Final Oxygen Re-Evaluation):   Initial Exercise Prescription:  Initial Exercise Prescription - 12/15/23 1100       Date of Initial Exercise RX and Referring Provider   Date 12/15/23    Referring Provider Floria Hurst, MD [facility])   Jacqueline Matsu, MD (coverage)   Expected Discharge Date 03/09/24      Treadmill   MPH 2.5    Grade 0    Minutes 15    METs 2.91      NuStep   Level 2    SPM 85    Minutes 15    METs 2.9      Prescription Details   Frequency (times per week) 3    Duration Progress to 30 minutes of continuous aerobic without signs/symptoms of physical distress      Intensity   THRR 40-80% of Max Heartrate 70-140    Ratings of Perceived Exertion 11-13    Perceived Dyspnea 0-4      Progression   Progression Continue to progress workloads to maintain intensity without signs/symptoms of physical distress.      Resistance Training   Training Prescription Yes    Weight 3 lbs    Reps 10-15             Perform Capillary Blood Glucose checks as needed.  Exercise Prescription Changes:   Exercise Prescription Changes     Row Name 12/23/23 1029 12/28/23 1029           Response to Exercise   Blood Pressure (Admit) 110/82 118/78      Blood Pressure (Exercise) 122/60 124/72      Blood Pressure (Exit) 102/60 122/78      Heart Rate (Admit) 81 bpm 86 bpm      Heart Rate (Exercise) 95 bpm 100 bpm      Heart Rate (Exit) 84 bpm 95 bpm      Rating of Perceived Exertion (Exercise) 11.5 9      Symptoms None None      Comments Off to a good start with exercise. --      Duration Continue with 30 min of aerobic exercise without signs/symptoms of physical distress. Continue with 30 min of aerobic exercise without signs/symptoms of physical distress.      Intensity THRR unchanged THRR  unchanged        Progression   Progression Continue to progress workloads to maintain intensity without signs/symptoms of physical distress. Continue to progress workloads to maintain intensity without signs/symptoms of physical distress.      Average METs 2.5 2.5        Resistance Training   Training Prescription No Yes      Weight Relaxation day, no weights. 3 lbs      Reps -- 10-15      Time -- 10 Minutes        Interval Training   Interval Training No No        Treadmill   MPH 2.5 2.5      Grade 0 0      Minutes 15 15      METs 2.91 2.91        NuStep   Level 2 2      SPM 80 102      Minutes  15 15      METs 2.1 2.8               Exercise Comments:   Exercise Comments     Row Name 12/23/23 1124 01/04/24 1048         Exercise Comments Tommy tolerated low intensity exercise well without symptoms. Reviewed goals with Tommy.               Exercise Goals and Review:   Exercise Goals     Row Name 12/15/23 0926             Exercise Goals   Increase Physical Activity Yes       Intervention Provide advice, education, support and counseling about physical activity/exercise needs.;Develop an individualized exercise prescription for aerobic and resistive training based on initial evaluation findings, risk stratification, comorbidities and participant's personal goals.       Expected Outcomes Short Term: Attend rehab on a regular basis to increase amount of physical activity.;Long Term: Exercising regularly at least 3-5 days a week.;Long Term: Add in home exercise to make exercise part of routine and to increase amount of physical activity.       Increase Strength and Stamina Yes       Intervention Provide advice, education, support and counseling about physical activity/exercise needs.;Develop an individualized exercise prescription for aerobic and resistive training based on initial evaluation findings, risk stratification, comorbidities and participant's  personal goals.       Expected Outcomes Short Term: Increase workloads from initial exercise prescription for resistance, speed, and METs.;Short Term: Perform resistance training exercises routinely during rehab and add in resistance training at home;Long Term: Improve cardiorespiratory fitness, muscular endurance and strength as measured by increased METs and functional capacity ( )       Able to understand and use rate of perceived exertion (RPE) scale Yes       Intervention Provide education and explanation on how to use RPE scale       Expected Outcomes Short Term: Able to use RPE daily in rehab to express subjective intensity level;Long Term:  Able to use RPE to guide intensity level when exercising independently       Knowledge and understanding of Target Heart Rate Range (THRR) Yes       Intervention Provide education and explanation of THRR including how the numbers were predicted and where they are located for reference       Expected Outcomes Short Term: Able to state/look up THRR;Long Term: Able to use THRR to govern intensity when exercising independently;Short Term: Able to use daily as guideline for intensity in rehab       Able to check pulse independently Yes       Intervention Provide education and demonstration on how to check pulse in carotid and radial arteries.;Review the importance of being able to check your own pulse for safety during independent exercise       Expected Outcomes Short Term: Able to explain why pulse checking is important during independent exercise;Long Term: Able to check pulse independently and accurately       Understanding of Exercise Prescription Yes       Intervention Provide education, explanation, and written materials on patient's individual exercise prescription       Expected Outcomes Short Term: Able to explain program exercise prescription;Long Term: Able to explain home exercise prescription to exercise independently                 Exercise  Goals Re-Evaluation :  Exercise Goals Re-Evaluation     Row Name 12/23/23 1124 01/04/24 1048           Exercise Goal Re-Evaluation   Exercise Goals Review Increase Physical Activity;Increase Strength and Stamina;Able to understand and use rate of perceived exertion (RPE) scale Increase Physical Activity;Increase Strength and Stamina;Able to understand and use rate of perceived exertion (RPE) scale      Comments Tommy was able to understand and use RPE scale appropriately. [name] is doing some walking at home in addition to exercise at cardiac rehab. his goal is to increase stamina and strength.      Expected Outcomes Progress workloads as tolerated to help improve cardiorespiratory ftness. Continue to progress workloads as tolerated to help increase stamina and strength.               Discharge Exercise Prescription (Final Exercise Prescription Changes):  Exercise Prescription Changes - 12/28/23 1029       Response to Exercise   Blood Pressure (Admit) 118/78    Blood Pressure (Exercise) 124/72    Blood Pressure (Exit) 122/78    Heart Rate (Admit) 86 bpm    Heart Rate (Exercise) 100 bpm    Heart Rate (Exit) 95 bpm    Rating of Perceived Exertion (Exercise) 9    Symptoms None    Duration Continue with 30 min of aerobic exercise without signs/symptoms of physical distress.    Intensity THRR unchanged      Progression   Progression Continue to progress workloads to maintain intensity without signs/symptoms of physical distress.    Average METs 2.5      Resistance Training   Training Prescription Yes    Weight 3 lbs    Reps 10-15    Time 10 Minutes      Interval Training   Interval Training No      Treadmill   MPH 2.5    Grade 0    Minutes 15    METs 2.91      NuStep   Level 2    SPM 102    Minutes 15    METs 2.8             Nutrition:  Target Goals: Understanding of nutrition guidelines, daily intake of sodium 1500mg , cholesterol 200mg ,  calories 30% from fat and 7% or less from saturated fats, daily to have 5 or more servings of fruits and vegetables.  Biometrics:  Pre Biometrics - 12/15/23 0917       Pre Biometrics   Waist Circumference 36.5 inches    Hip Circumference 40.25 inches    Waist to Hip Ratio 0.91 %    Triceps Skinfold 14 mm    % Body Fat 24.3 %    Grip Strength 42 kg    Flexibility --   Not performed due to post-surgical pain.   Single Leg Stand 30 seconds              Nutrition Therapy Plan and Nutrition Goals:  Nutrition Therapy & Goals - 12/24/23 1454       Nutrition Therapy   Diet Heart Healthy Diet    Drug/Food Interactions Statins/Certain Fruits      Personal Nutrition Goals   Nutrition Goal Patient to identify strategies for reducing cardiovascular risk by attending the [employer] education and nutrition series weekly.    Personal Goal #2 Patient to improve diet quality by using the plate method as a guide for meal planning to include lean  protein/plant protein, fruits, vegetables, whole grains, nonfat dairy as part of a well-balanced diet.    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 He continues prednisone  at this time. He is motivated to maintain weight between 170-180#. Lipids are well controlled. Patient will benefit from participation in intensive cardiac rehab for nutrition, exercise, and lifestyle modification.      Intervention Plan   Intervention Prescribe, educate and counsel regarding individualized specific dietary modifications aiming towards targeted core components such as weight, hypertension, lipid management, diabetes, heart failure and other comorbidities.;Nutrition handout(s) given to patient.    Expected Outcomes Short Term Goal: Understand basic principles of dietary content, such as calories, fat, sodium, cholesterol and nutrients.;Long Term Goal: Adherence to prescribed  nutrition plan.             Nutrition Assessments:  Nutrition Assessments - 12/31/23 1159       Rate Your Plate Scores   Pre Score 48            MEDIFICTS Score Key: >=70 Need to make dietary changes  40-70 Heart Healthy Diet <= 40 Therapeutic Level Cholesterol Diet   Flowsheet Row INTENSIVE CARDIAC REHAB from 12/30/2023 in [facility] Heart, Vascular, & Lung Health  Picture Your Plate Total Score on Admission 48      Picture Your Plate Scores: <16 Unhealthy dietary pattern with much room for improvement. 41-50 Dietary pattern unlikely to meet recommendations for good health and room for improvement. 51-60 More healthful dietary pattern, with some room for improvement.  >60 Healthy dietary pattern, although there may be some specific behaviors that could be improved.    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 He continues prednisone  at this time. He is motivated to maintain weight between 170-180#. Patient will benefit from participation in intensive cardiac rehab for nutrition, exercise, and lifestyle modification.                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 He continues prednisone  at this time. He is motivated to maintain weight between 170-180#. Patient will benefit from participation in intensive  cardiac rehab for nutrition, exercise, and lifestyle modification.                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 He continues prednisone  at this time. He is motivated to maintain weight between 170-180#. Patient will benefit from participation in intensive cardiac rehab for nutrition, exercise, and lifestyle modification.             Psychosocial: Target Goals: Acknowledge presence or absence of significant depression and/or stress, maximize coping skills, provide positive support system. Participant is able to verbalize types and ability to use techniques and skills needed for reducing stress and depression.  Initial Review & Psychosocial Screening:  Initial Psych Review & Screening - 12/15/23 1022       Initial Review   Current issues with History of Depression;Current Anxiety/Panic;Current Psychotropic Meds;Current Depression;Current Stress Concerns    Source of Stress Concerns Family;Chronic Illness      Family Dynamics   Good Support System? Yes   [name] has his wife Tanis Fan and Aunt for support   Comments [name] has PTSD and is receiving counseling every 2-3 weeks virtually via [facility]. Tommy admits to being currently depressed and experiences anxiety. [name] says that the antidpressant and antianxiety medications he is currently taking is working.      Barriers   Psychosocial barriers to participate in program The patient should benefit from training in stress management and relaxation.      Screening Interventions   Interventions Encouraged to exercise;To provide support and resources with identified psychosocial needs;Provide feedback about the  scores to participant    Expected Outcomes Long Term Goal: Stressors or current issues are controlled or eliminated.;Short Term goal: Identification and review with participant of any Quality of Life or Depression concerns found by scoring the questionnaire.;Long Term goal: The participant improves quality of Life and PHQ9 Scores as seen by post scores and/or verbalization of changes             Quality of Life Scores:  Quality of Life - 12/15/23 1151       Quality of Life   Select Quality of Life      Quality of Life Scores   Health/Function Pre 11.3 %    Socioeconomic Pre 21.07 %    Psych/Spiritual Pre 15.07 %    Family Pre 27.6 %    GLOBAL Pre 16.49 %            Scores of 19 and below usually indicate a poorer quality of life in these areas.  A difference of  2-3 points is a clinically meaningful difference.  A difference of 2-3 points in the total score of the Quality of Life Index has been associated with significant improvement in overall quality of life, self-image, physical symptoms, and general health in studies assessing change in quality of life.  PHQ-9: Review Flowsheet  More data exists      12/15/2023 09/30/2023 08/31/2023 07/21/2023 04/15/2023  Depression screen PHQ 2/9  Decreased Interest 0 2 1 3 2   Down, Depressed, Hopeless 1 2 1 3 2   PHQ - 2 Score 1 4 2 6 4   Altered sleeping 1 2 1 3 1   Tired, decreased energy 1 3 2 3 3   Change in appetite 0 1 0 2 1  Feeling bad or failure about yourself  1 2 1 3  0  Trouble concentrating 1 1 1 3 3   Moving slowly or fidgety/restless 0 1 1 2 1   Suicidal thoughts 0 0 0 0 0  PHQ-9 Score 5 14 8 22 13   Difficult doing work/chores Extremely dIfficult Extremely dIfficult Extremely dIfficult Extremely dIfficult Very difficult  Interpretation of Total Score  Total Score Depression Severity:  1-4 = Minimal depression, 5-9 = Mild depression, 10-14 = Moderate depression, 15-19 = Moderately severe depression, 20-27 = Severe  depression   Psychosocial Evaluation and Intervention:   Psychosocial Re-Evaluation:  Psychosocial Re-Evaluation     Row Name 12/24/23 0902 01/05/24 1021           Psychosocial Re-Evaluation   Current issues with History of Depression;Current Depression;Current Stress Concerns;Current Anxiety/Panic;Current Psychotropic Meds History of Depression;Current Depression;Current Stress Concerns;Current Anxiety/Panic;Current Psychotropic Meds      Comments [name] was 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 Will review quality of life questionnaire in the upcoming week Quality of life and PHQ9 reviewed.Tommy admits to being dissatisfied with his health due to his recent heart transplant surgery and is experiencing some depression. [name] says his still gets fatigued easily which is a challenge since he has a family with small children. [name] has good support from his wife. [name] is currently receiving  virtual counseling from  [facility]. [name] al;so sees a psychiatrist. [name] had a biopsy last week. [name] says his rejection risk has been downgraded which is reassuring for Tommy.      Expected Outcomes Tommy will have controlled or decreased depression / anxiet and stressors upon return to exercise at cardiac rehab Tommy will have controlled or decreased depression / anxiet and stressors upon return to exercise at cardiac rehab      Interventions Stress management education;Encouraged to attend Cardiac Rehabilitation for the exercise;Relaxation education Stress management education;Encouraged to attend Cardiac Rehabilitation for the exercise;Relaxation education      Continue Psychosocial Services  Follow up required by staff Follow up required by staff        Initial Review   Source of Stress Concerns Chronic Illness;Unable to participate in former interests or hobbies;Unable to perform yard/household activities Chronic Illness;Unable  to participate in former interests or hobbies;Unable to perform yard/household activities      Comments Will continue to monitor and offer support as needed Will continue to monitor and offer support as needed               Psychosocial Discharge (Final Psychosocial Re-Evaluation):  Psychosocial Re-Evaluation - 01/05/24 1021       Psychosocial Re-Evaluation   Current issues with History of Depression;Current Depression;Current Stress Concerns;Current Anxiety/Panic;Current Psychotropic Meds    Comments Quality of life and PHQ9 reviewed.Tommy admits to being dissatisfied with his health due to his recent heart transplant surgery and is experiencing some depression. [name] says his still gets fatigued easily which is a challenge since he has a family with small children. [name] has good support from his wife. [name] is currently receiving  virtual counseling from  [facility]. [name] al;so sees a psychiatrist. [name] had a biopsy last week. [name] says his rejection risk has been downgraded which is reassuring for Tommy.    Expected Outcomes Tommy will have controlled or decreased depression / anxiet and stressors upon return to exercise at cardiac rehab    Interventions Stress management education;Encouraged to attend Cardiac Rehabilitation for the exercise;Relaxation education    Continue Psychosocial Services  Follow up required by staff      Initial Review   Source of Stress Concerns Chronic Illness;Unable to participate in former interests or hobbies;Unable to perform yard/household activities    Comments Will continue to monitor and offer support as needed  Vocational Rehabilitation: Provide vocational rehab assistance to qualifying candidates.   Vocational Rehab Evaluation & Intervention:  Vocational Rehab - 12/15/23 1030       Initial Vocational Rehab Evaluation & Intervention   Assessment shows need for Vocational Rehabilitation No   [name] is currently on LOA and does not  need vocational rehab at this time            Education: Education Goals: Education classes will be provided on a weekly basis, covering required topics. Participant will state understanding/return demonstration of topics presented.    Education     Row Name 12/23/23 1400     Education   Cardiac Education Topics Pritikin   Orthoptist   Educator Dietitian   Weekly Topic Adding Flavor - Sodium-Free   Instruction Review Code 1- Verbalizes Understanding   Class Start Time 1145   Class Stop Time 1221   Class Time Calculation (min) 36 min    Row Name 12/28/23 1300     Education   Cardiac Education Topics Pritikin   Glass blower/designer Nutrition   Nutrition Workshop Label Reading   Instruction Review Code 1- Verbalizes Understanding   Class Start Time 1145   Class Stop Time 1228   Class Time Calculation (min) 43 min    Row Name 01/04/24 1200     Education   Cardiac Education Topics Pritikin   Hospital doctor Education   General Education Metabolic Syndrome and Belly Fat   Instruction Review Code 1- Verbalizes Understanding   Class Start Time 1150   Class Stop Time 1230   Class Time Calculation (min) 40 min            Core Videos: Exercise    Move It!  Clinical staff conducted group or individual video education with verbal and written material and guidebook.  Patient learns the recommended Pritikin exercise program. Exercise with the goal of living a long, healthy life. Some of the health benefits of exercise include controlled diabetes, healthier blood pressure levels, improved cholesterol levels, improved heart and lung capacity, improved sleep, and better body composition. Everyone should speak with their doctor before starting or changing an exercise routine.  Biomechanical Limitations Clinical staff conducted  group or individual video education with verbal and written material and guidebook.  Patient learns how biomechanical limitations can impact exercise and how we can mitigate and possibly overcome limitations to have an impactful and balanced exercise routine.  Body Composition Clinical staff conducted group or individual video education with verbal and written material and guidebook.  Patient learns that body composition (ratio of muscle mass to fat mass) is a key component to assessing overall fitness, rather than body weight alone. Increased fat mass, especially visceral belly fat, can put us  at increased risk for metabolic syndrome, type 2 diabetes, heart disease, and even death. It is recommended to combine diet and exercise (cardiovascular and resistance training) to improve your body composition. Seek guidance from your physician and exercise physiologist before implementing an exercise routine.  Exercise Action Plan Clinical staff conducted group or individual video education with verbal and written material and guidebook.  Patient learns the recommended strategies to achieve and enjoy long-term exercise adherence, including variety, self-motivation, self-efficacy, and positive decision making. Benefits of exercise include fitness, good health, weight management, more  energy, better sleep, less stress, and overall well-being.  Medical   Heart Disease Risk Reduction Clinical staff conducted group or individual video education with verbal and written material and guidebook.  Patient learns our heart is our most vital organ as it circulates oxygen, nutrients, white blood cells, and hormones throughout the entire body, and carries waste away. Data supports a plant-based eating plan like the Pritikin Program for its effectiveness in slowing progression of and reversing heart disease. The video provides a number of recommendations to address heart disease.   Metabolic Syndrome and Belly Fat   Clinical staff conducted group or individual video education with verbal and written material and guidebook.  Patient learns what metabolic syndrome is, how it leads to heart disease, and how one can reverse it and keep it from coming back. You have metabolic syndrome if you have 3 of the following 5 criteria: abdominal obesity, high blood pressure, high triglycerides, low HDL cholesterol, and high blood sugar.  Hypertension and Heart Disease Clinical staff conducted group or individual video education with verbal and written material and guidebook.  Patient learns that high blood pressure, or hypertension, is very common in the [place] . Hypertension is largely due to excessive salt intake, but other important risk factors include being overweight, physical inactivity, drinking too much alcohol, smoking, and not eating enough potassium from fruits and vegetables. High blood pressure is a leading risk factor for heart attack, stroke, congestive heart failure, dementia, kidney failure, and premature death. Long-term effects of excessive salt intake include stiffening of the arteries and thickening of heart muscle and organ damage. Recommendations include ways to reduce hypertension and the risk of heart disease.  Diseases of Our Time - Focusing on Diabetes Clinical staff conducted group or individual video education with verbal and written material and guidebook.  Patient learns why the best way to stop diseases of our time is prevention, through food and other lifestyle changes. Medicine (such as prescription pills and surgeries) is often only a Band-Aid on the problem, not a long-term solution. Most common diseases of our time include obesity, type 2 diabetes, hypertension, heart disease, and cancer. The Pritikin Program is recommended and has been proven to help reduce, reverse, and/or prevent the damaging effects of metabolic syndrome.  Nutrition   Overview of the Pritikin Eating Plan   Clinical staff conducted group or individual video education with verbal and written material and guidebook.  Patient learns about the Pritikin Eating Plan for disease risk reduction. The Pritikin Eating Plan emphasizes a wide variety of unrefined, minimally-processed carbohydrates, like fruits, vegetables, whole grains, and legumes. Go, Caution, and Stop food choices are explained. Plant-based and lean animal proteins are emphasized. Rationale provided for low sodium intake for blood pressure control, low added sugars for blood sugar stabilization, and low added fats and oils for coronary artery disease risk reduction and weight management.  Calorie Density  Clinical staff conducted group or individual video education with verbal and written material and guidebook.  Patient learns about calorie density and how it impacts the Pritikin Eating Plan. Knowing the characteristics of the food you choose will help you decide whether those foods will lead to weight gain or weight loss, and whether you want to consume more or less of them. Weight loss is usually a side effect of the Pritikin Eating Plan because of its focus on low calorie-dense foods.  Label Reading  Clinical staff conducted group or individual video education with verbal and written material and guidebook.  Patient learns about the Pritikin recommended label reading guidelines and corresponding recommendations regarding calorie density, added sugars, sodium content, and whole grains.  Dining Out - Part 1  Clinical staff conducted group or individual video education with verbal and written material and guidebook.  Patient learns that restaurant meals can be sabotaging because they can be so high in calories, fat, sodium, and/or sugar. Patient learns recommended strategies on how to positively address this and avoid unhealthy pitfalls.  Facts on Fats  Clinical staff conducted group or individual video education with verbal and written  material and guidebook.  Patient learns that lifestyle modifications can be just as effective, if not more so, as many medications for lowering your risk of heart disease. A Pritikin lifestyle can help to reduce your risk of inflammation and atherosclerosis (cholesterol build-up, or plaque, in the artery walls). Lifestyle interventions such as dietary choices and physical activity address the cause of atherosclerosis. A review of the types of fats and their impact on blood cholesterol levels, along with dietary recommendations to reduce fat intake is also included.  Nutrition Action Plan  Clinical staff conducted group or individual video education with verbal and written material and guidebook.  Patient learns how to incorporate Pritikin recommendations into their lifestyle. Recommendations include planning and keeping personal health goals in mind as an important part of their success.  Healthy Mind-Set    Healthy Minds, Bodies, Hearts  Clinical staff conducted group or individual video education with verbal and written material and guidebook.  Patient learns how to identify when they are stressed. Video will discuss the impact of that stress, as well as the many benefits of stress management. Patient will also be introduced to stress management techniques. The way we think, act, and feel has an impact on our hearts.  How Our Thoughts Can Heal Our Hearts  Clinical staff conducted group or individual video education with verbal and written material and guidebook.  Patient learns that negative thoughts can cause depression and anxiety. This can result in negative lifestyle behavior and serious health problems. Cognitive behavioral therapy is an effective method to help control our thoughts in order to change and improve our emotional outlook.  Additional Videos:  Exercise    Improving Performance  Clinical staff conducted group or individual video education with verbal and written material and  guidebook.  Patient learns to use a non-linear approach by alternating intensity levels and lengths of time spent exercising to help burn more calories and lose more body fat. Cardiovascular exercise helps improve heart health, metabolism, hormonal balance, blood sugar control, and recovery from fatigue. Resistance training improves strength, endurance, balance, coordination, reaction time, metabolism, and muscle mass. Flexibility exercise improves circulation, posture, and balance. Seek guidance from your physician and exercise physiologist before implementing an exercise routine and learn your capabilities and proper form for all exercise.  Introduction to Yoga  Clinical staff conducted group or individual video education with verbal and written material and guidebook.  Patient learns about yoga, a discipline of the coming together of mind, breath, and body. The benefits of yoga include improved flexibility, improved range of motion, better posture and core strength, increased lung function, weight loss, and positive self-image. Yoga's heart health benefits include lowered blood pressure, healthier heart rate, decreased cholesterol and triglyceride levels, improved immune function, and reduced stress. Seek guidance from your physician and exercise physiologist before implementing an exercise routine and learn your capabilities and proper form for all exercise.  Medical   Aging: Enhancing  Your Quality of Life  Clinical staff conducted group or individual video education with verbal and written material and guidebook.  Patient learns key strategies and recommendations to stay in good physical health and enhance quality of life, such as prevention strategies, having an advocate, securing a Health Care Proxy and Power of Attorney, and keeping a list of medications and system for tracking them. It also discusses how to avoid risk for bone loss.  Biology of Weight Control  Clinical staff conducted group or  individual video education with verbal and written material and guidebook.  Patient learns that weight gain occurs because we consume more calories than we burn (eating more, moving less). Even if your body weight is normal, you may have higher ratios of fat compared to muscle mass. Too much body fat puts you at increased risk for cardiovascular disease, heart attack, stroke, type 2 diabetes, and obesity-related cancers. In addition to exercise, following the Pritikin Eating Plan can help reduce your risk.  Decoding Lab Results  Clinical staff conducted group or individual video education with verbal and written material and guidebook.  Patient learns that lab test reflects one measurement whose values change over time and are influenced by many factors, including medication, stress, sleep, exercise, food, hydration, pre-existing medical conditions, and more. It is recommended to use the knowledge from this video to become more involved with your lab results and evaluate your numbers to speak with your doctor.   Diseases of Our Time - Overview  Clinical staff conducted group or individual video education with verbal and written material and guidebook.  Patient learns that according to the CDC, 50% to 70% of chronic diseases (such as obesity, type 2 diabetes, elevated lipids, hypertension, and heart disease) are avoidable through lifestyle improvements including healthier food choices, listening to satiety cues, and increased physical activity.  Sleep Disorders Clinical staff conducted group or individual video education with verbal and written material and guidebook.  Patient learns how good quality and duration of sleep are important to overall health and well-being. Patient also learns about sleep disorders and how they impact health along with recommendations to address them, including discussing with a physician.  Nutrition  Dining Out - Part 2 Clinical staff conducted group or individual video  education with verbal and written material and guidebook.  Patient learns how to plan ahead and communicate in order to maximize their dining experience in a healthy and nutritious manner. Included are recommended food choices based on the type of restaurant the patient is visiting.   Fueling a Banker conducted group or individual video education with verbal and written material and guidebook.  There is a strong connection between our food choices and our health. Diseases like obesity and type 2 diabetes are very prevalent and are in large-part due to lifestyle choices. The Pritikin Eating Plan provides plenty of food and hunger-curbing satisfaction. It is easy to follow, affordable, and helps reduce health risks.  Menu Workshop  Clinical staff conducted group or individual video education with verbal and written material and guidebook.  Patient learns that restaurant meals can sabotage health goals because they are often packed with calories, fat, sodium, and sugar. Recommendations include strategies to plan ahead and to communicate with the manager, chef, or server to help order a healthier meal.  Planning Your Eating Strategy  Clinical staff conducted group or individual video education with verbal and written material and guidebook.  Patient learns about the Pritikin Eating Plan and its  benefit of reducing the risk of disease. The Pritikin Eating Plan does not focus on calories. Instead, it emphasizes high-quality, nutrient-rich foods. By knowing the characteristics of the foods, we choose, we can determine their calorie density and make informed decisions.  Targeting Your Nutrition Priorities  Clinical staff conducted group or individual video education with verbal and written material and guidebook.  Patient learns that lifestyle habits have a tremendous impact on disease risk and progression. This video provides eating and physical activity recommendations based on your  personal health goals, such as reducing LDL cholesterol, losing weight, preventing or controlling type 2 diabetes, and reducing high blood pressure.  Vitamins and Minerals  Clinical staff conducted group or individual video education with verbal and written material and guidebook.  Patient learns different ways to obtain key vitamins and minerals, including through a recommended healthy diet. It is important to discuss all supplements you take with your doctor.   Healthy Mind-Set    Smoking Cessation  Clinical staff conducted group or individual video education with verbal and written material and guidebook.  Patient learns that cigarette smoking and tobacco addiction pose a serious health risk which affects millions of people. Stopping smoking will significantly reduce the risk of heart disease, lung disease, and many forms of cancer. Recommended strategies for quitting are covered, including working with your doctor to develop a successful plan.  Culinary   Becoming a Set designer conducted group or individual video education with verbal and written material and guidebook.  Patient learns that cooking at home can be healthy, cost-effective, quick, and puts them in control. Keys to cooking healthy recipes will include looking at your recipe, assessing your equipment needs, planning ahead, making it simple, choosing cost-effective seasonal ingredients, and limiting the use of added fats, salts, and sugars.  Cooking - Breakfast and Snacks  Clinical staff conducted group or individual video education with verbal and written material and guidebook.  Patient learns how important breakfast is to satiety and nutrition through the entire day. Recommendations include key foods to eat during breakfast to help stabilize blood sugar levels and to prevent overeating at meals later in the day. Planning ahead is also a key component.  Cooking - Educational psychologist conducted  group or individual video education with verbal and written material and guidebook.  Patient learns eating strategies to improve overall health, including an approach to cook more at home. Recommendations include thinking of animal protein as a side on your plate rather than center stage and focusing instead on lower calorie dense options like vegetables, fruits, whole grains, and plant-based proteins, such as beans. Making sauces in large quantities to freeze for later and leaving the skin on your vegetables are also recommended to maximize your experience.  Cooking - Healthy Salads and Dressing Clinical staff conducted group or individual video education with verbal and written material and guidebook.  Patient learns that vegetables, fruits, whole grains, and legumes are the foundations of the Pritikin Eating Plan. Recommendations include how to incorporate each of these in flavorful and healthy salads, and how to create homemade salad dressings. Proper handling of ingredients is also covered. Cooking - Soups and [employer] - Soups and Desserts Clinical staff conducted group or individual video education with verbal and written material and guidebook.  Patient learns that Pritikin soups and desserts make for easy, nutritious, and delicious snacks and meal components that are low in sodium, fat, sugar, and calorie density, while high  in vitamins, minerals, and filling fiber. Recommendations include simple and healthy ideas for soups and desserts.   Overview     The Pritikin Solution Program Overview Clinical staff conducted group or individual video education with verbal and written material and guidebook.  Patient learns that the results of the Pritikin Program have been documented in more than 100 articles published in peer-reviewed journals, and the benefits include reducing risk factors for (and, in some cases, even reversing) high cholesterol, high blood pressure, type 2 diabetes, obesity,  and more! An overview of the three key pillars of the Pritikin Program will be covered: eating well, doing regular exercise, and having a healthy mind-set.  WORKSHOPS  Exercise: Exercise Basics: Building Your Action Plan Clinical staff led group instruction and group discussion with PowerPoint presentation and patient guidebook. To enhance the learning environment the use of posters, models and videos may be added. At the conclusion of this workshop, patients will comprehend the difference between physical activity and exercise, as well as the benefits of incorporating both, into their routine. Patients will understand the FITT (Frequency, Intensity, Time, and Type) principle and how to use it to build an exercise action plan. In addition, safety concerns and other considerations for exercise and cardiac rehab will be addressed by the presenter. The purpose of this lesson is to promote a comprehensive and effective weekly exercise routine in order to improve patients' overall level of fitness.   Managing Heart Disease: Your Path to a Healthier Heart Clinical staff led group instruction and group discussion with PowerPoint presentation and patient guidebook. To enhance the learning environment the use of posters, models and videos may be added.At the conclusion of this workshop, patients will understand the anatomy and physiology of the heart. Additionally, they will understand how Pritikin's three pillars impact the risk factors, the progression, and the management of heart disease.  The purpose of this lesson is to provide a high-level overview of the heart, heart disease, and how the Pritikin lifestyle positively impacts risk factors.  Exercise Biomechanics Clinical staff led group instruction and group discussion with PowerPoint presentation and patient guidebook. To enhance the learning environment the use of posters, models and videos may be added. Patients will learn how the structural  parts of their bodies function and how these functions impact their daily activities, movement, and exercise. Patients will learn how to promote a neutral spine, learn how to manage pain, and identify ways to improve their physical movement in order to promote healthy living. The purpose of this lesson is to expose patients to common physical limitations that impact physical activity. Participants will learn practical ways to adapt and manage aches and pains, and to minimize their effect on regular exercise. Patients will learn how to maintain good posture while sitting, walking, and lifting.  Balance Training and Fall Prevention  Clinical staff led group instruction and group discussion with PowerPoint presentation and patient guidebook. To enhance the learning environment the use of posters, models and videos may be added. At the conclusion of this workshop, patients will understand the importance of their sensorimotor skills (vision, proprioception, and the vestibular system) in maintaining their ability to balance as they age. Patients will apply a variety of balancing exercises that are appropriate for their current level of function. Patients will understand the common causes for poor balance, possible solutions to these problems, and ways to modify their physical environment in order to minimize their fall risk. The purpose of this lesson is to teach patients about  the importance of maintaining balance as they age and ways to minimize their risk of falling.  WORKSHOPS   Nutrition:  Fueling a Ship broker led group instruction and group discussion with PowerPoint presentation and patient guidebook. To enhance the learning environment the use of posters, models and videos may be added. Patients will review the foundational principles of the Pritikin Eating Plan and understand what constitutes a serving size in each of the food groups. Patients will also learn Pritikin-friendly  foods that are better choices when away from home and review make-ahead meal and snack options. Calorie density will be reviewed and applied to three nutrition priorities: weight maintenance, weight loss, and weight gain. The purpose of this lesson is to reinforce (in a group setting) the key concepts around what patients are recommended to eat and how to apply these guidelines when away from home by planning and selecting Pritikin-friendly options. Patients will understand how calorie density may be adjusted for different weight management goals.  Mindful Eating  Clinical staff led group instruction and group discussion with PowerPoint presentation and patient guidebook. To enhance the learning environment the use of posters, models and videos may be added. Patients will briefly review the concepts of the Pritikin Eating Plan and the importance of low-calorie dense foods. The concept of mindful eating will be introduced as well as the importance of paying attention to internal hunger signals. Triggers for non-hunger eating and techniques for dealing with triggers will be explored. The purpose of this lesson is to provide patients with the opportunity to review the basic principles of the Pritikin Eating Plan, discuss the value of eating mindfully and how to measure internal cues of hunger and fullness using the Hunger Scale. Patients will also discuss reasons for non-hunger eating and learn strategies to use for controlling emotional eating.  Targeting Your Nutrition Priorities Clinical staff led group instruction and group discussion with PowerPoint presentation and patient guidebook. To enhance the learning environment the use of posters, models and videos may be added. Patients will learn how to determine their genetic susceptibility to disease by reviewing their family history. Patients will gain insight into the importance of diet as part of an overall healthy lifestyle in mitigating the impact of  genetics and other environmental insults. The purpose of this lesson is to provide patients with the opportunity to assess their personal nutrition priorities by looking at their family history, their own health history and current risk factors. Patients will also be able to discuss ways of prioritizing and modifying the Pritikin Eating Plan for their highest risk areas  Menu  Clinical staff led group instruction and group discussion with PowerPoint presentation and patient guidebook. To enhance the learning environment the use of posters, models and videos may be added. Using menus brought in from [employer], or printed from [employer], patients will apply the Pritikin dining out guidelines that were presented in the [employer] video. Patients will also be able to practice these guidelines in a variety of provided scenarios. The purpose of this lesson is to provide patients with the opportunity to practice hands-on learning of the Pritikin Dining Out guidelines with actual menus and practice scenarios.  Label Reading Clinical staff led group instruction and group discussion with PowerPoint presentation and patient guidebook. To enhance the learning environment the use of posters, models and videos may be added. Patients will review and discuss the Pritikin label reading guidelines presented in [employer]'s Label Reading Educational series video. Using  fool labels brought in from local grocery stores and markets, patients will apply the label reading guidelines and determine if the packaged food meet the Pritikin guidelines. The purpose of this lesson is to provide patients with the opportunity to review, discuss, and practice hands-on learning of the Pritikin Label Reading guidelines with actual packaged food labels. Cooking School  [employer]'s [employer] are designed to teach patients ways to prepare quick, simple, and affordable recipes at home. The importance of  nutrition's role in chronic disease risk reduction is reflected in its emphasis in the overall Pritikin program. By learning how to prepare essential core Pritikin Eating Plan recipes, patients will increase control over what they eat; be able to customize the flavor of foods without the use of added salt, sugar, or fat; and improve the quality of the food they consume. By learning a set of core recipes which are easily assembled, quickly prepared, and affordable, patients are more likely to prepare more healthy foods at home. These workshops focus on convenient breakfasts, simple entres, side dishes, and desserts which can be prepared with minimal effort and are consistent with nutrition recommendations for cardiovascular risk reduction. Cooking [employer] are taught by a Armed forces logistics/support/administrative officer (RD) who has been trained by the [employer]. The chef or RD has a clear understanding of the importance of minimizing - if not completely eliminating - added fat, sugar, and sodium in recipes. Throughout the series of Cooking School Workshop sessions, patients will learn about healthy ingredients and efficient methods of cooking to build confidence in their capability to prepare    Cooking School weekly topics:  Adding Flavor- Sodium-Free  Fast and Healthy Breakfasts  Powerhouse Plant-Based Proteins  Satisfying Salads and Dressings  Simple Sides and Sauces  International Cuisine-Spotlight on the [employer] Zones  Delicious Desserts  Savory Soups  [employer] - Meals in a Astronomer Appetizers and Snacks  Comforting Weekend Breakfasts  One-Pot Wonders   Fast Evening Meals  Landscape architect Your Pritikin Plate  WORKSHOPS   Healthy Mindset (Psychosocial):  Focused Goals, Sustainable Changes Clinical staff led group instruction and group discussion with PowerPoint presentation and patient guidebook. To enhance the learning environment the use of posters,  models and videos may be added. Patients will be able to apply effective goal setting strategies to establish at least one personal goal, and then take consistent, meaningful action toward that goal. They will learn to identify common barriers to achieving personal goals and develop strategies to overcome them. Patients will also gain an understanding of how our mind-set can impact our ability to achieve goals and the importance of cultivating a positive and growth-oriented mind-set. The purpose of this lesson is to provide patients with a deeper understanding of how to set and achieve personal goals, as well as the tools and strategies needed to overcome common obstacles which may arise along the way.  From Head to Heart: The Power of a Healthy Outlook  Clinical staff led group instruction and group discussion with PowerPoint presentation and patient guidebook. To enhance the learning environment the use of posters, models and videos may be added. Patients will be able to recognize and describe the impact of emotions and mood on physical health. They will discover the importance of self-care and explore self-care practices which may work for them. Patients will also learn how to utilize the 4 C's to cultivate a healthier outlook and better manage stress and challenges. The purpose of  this lesson is to demonstrate to patients how a healthy outlook is an essential part of maintaining good health, especially as they continue their cardiac rehab journey.  Healthy Sleep for a Healthy Heart Clinical staff led group instruction and group discussion with PowerPoint presentation and patient guidebook. To enhance the learning environment the use of posters, models and videos may be added. At the conclusion of this workshop, patients will be able to demonstrate knowledge of the importance of sleep to overall health, well-being, and quality of life. They will understand the symptoms of, and treatments for, common sleep  disorders. Patients will also be able to identify daytime and nighttime behaviors which impact sleep, and they will be able to apply these tools to help manage sleep-related challenges. The purpose of this lesson is to provide patients with a general overview of sleep and outline the importance of quality sleep. Patients will learn about a few of the most common sleep disorders. Patients will also be introduced to the concept of "sleep hygiene," and discover ways to self-manage certain sleeping problems through simple daily behavior changes. Finally, the workshop will motivate patients by clarifying the links between quality sleep and their goals of heart-healthy living.   Recognizing and Reducing Stress Clinical staff led group instruction and group discussion with PowerPoint presentation and patient guidebook. To enhance the learning environment the use of posters, models and videos may be added. At the conclusion of this workshop, patients will be able to understand the types of stress reactions, differentiate between acute and chronic stress, and recognize the impact that chronic stress has on their health. They will also be able to apply different coping mechanisms, such as reframing negative self-talk. Patients will have the opportunity to practice a variety of stress management techniques, such as deep abdominal breathing, progressive muscle relaxation, and/or guided imagery.  The purpose of this lesson is to educate patients on the role of stress in their lives and to provide healthy techniques for coping with it.  Learning Barriers/Preferences:  Learning Barriers/Preferences - 12/15/23 1029       Learning Barriers/Preferences   Learning Barriers Exercise Concerns   Chronic lower back pain,  occasional lightheaded when changing postions   Learning Preferences Pictoral;Skilled Demonstration             Education Topics:  Knowledge Questionnaire Score:  Knowledge Questionnaire Score -  12/15/23 1152       Knowledge Questionnaire Score   Pre Score 23/24             Core Components/Risk Factors/Patient Goals at Admission:  Personal Goals and Risk Factors at Admission - 12/15/23 1013       Core Components/Risk Factors/Patient Goals on Admission    Weight Management Weight Loss;Yes    Intervention Weight Management: Provide education and appropriate resources to help participant work on and attain dietary goals.;Weight Management: Develop a combined nutrition and exercise program designed to reach desired caloric intake, while maintaining appropriate intake of nutrient and fiber, sodium and fats, and appropriate energy expenditure required for the weight goal.    Admit Weight 183 lb 1.6 oz (83.1 kg)    Goal Weight: Short Term 180 lb (81.6 kg)    Goal Weight: Long Term 170 lb (77.1 kg)    Expected Outcomes Short Term: Continue to assess and modify interventions until short term weight is achieved;Long Term: Adherence to nutrition and physical activity/exercise program aimed toward attainment of established weight goal;Weight Loss: Understanding of general recommendations for a balanced  deficit meal plan, which promotes 1-2 lb weight loss per week and includes a negative energy balance of [phone] kcal/d    Hypertension Yes    Intervention Provide education on lifestyle modifcations including regular physical activity/exercise, weight management, moderate sodium restriction and increased consumption of fresh fruit, vegetables, and low fat dairy, alcohol moderation, and smoking cessation.;Monitor prescription use compliance.    Expected Outcomes Short Term: Continued assessment and intervention until BP is < 140/24mm HG in hypertensive participants. < 130/10mm HG in hypertensive participants with diabetes, heart failure or chronic kidney disease.;Long Term: Maintenance of blood pressure at goal levels.    Lipids Yes    Intervention Provide education and support for participant  on nutrition & aerobic/resistive exercise along with prescribed medications to achieve LDL 70mg , HDL >40mg .    Expected Outcomes Short Term: Participant states understanding of desired cholesterol values and is compliant with medications prescribed. Participant is following exercise prescription and nutrition guidelines.;Long Term: Cholesterol controlled with medications as prescribed, with individualized exercise RX and with personalized nutrition plan. Value goals: LDL < 70mg , HDL > 40 mg.    Stress Yes    Intervention Offer individual and/or small group education and counseling on adjustment to heart disease, stress management and health-related lifestyle change. Teach and support self-help strategies.;Refer participants experiencing significant psychosocial distress to appropriate mental health specialists for further evaluation and treatment. When possible, include family members and significant others in education/counseling sessions.    Expected Outcomes Short Term: Participant demonstrates changes in health-related behavior, relaxation and other stress management skills, ability to obtain effective social support, and compliance with psychotropic medications if prescribed.;Long Term: Emotional wellbeing is indicated by absence of clinically significant psychosocial distress or social isolation.    Personal Goal Other Yes    Personal Goal Decrease BMI. Maintain weight between 170-180 lbs. Eat more fruits and vegetables, less salt. Reduce/ remove anxiety medication. Maintain or reduce depression medication. Meditate more.    Intervention [name] will attend Exercise, Nutrtion, and Healthy Mind-Set workshops to help achieve personal health and fitness goals.    Expected Outcomes Tommy will achieve and maintain weight goal. He will know and practice meditation techniques. He will incorporate more fruits and vegetables in his diet. He will develop and Exercise Action Plan he can maintain to increase  strength and stamina as measured by self-report and grip strenght and walk tests.             Core Components/Risk Factors/Patient Goals Review:   Goals and Risk Factor Review     Row Name 12/24/23 0908 01/05/24 1023           Core Components/Risk Factors/Patient Goals Review   Personal Goals Review Weight Management/Obesity;Stress;Lipids;Hypertension Weight Management/Obesity;Stress;Lipids;Hypertension      Review Tommy started cardiac rehab on 12/23/23. Tommy did well with exercise. Vital signs were stable. 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 Vital signs have been stable. [name] has lost 1 kg since starting the program      Expected Outcomes Tommy will continue to participate in cardiac rehab for exercise, nutrition and lifestyle modifications [name] will continue to participate in cardiac rehab for exercise, nutrition and lifestyle modifications               Core Components/Risk Factors/Patient Goals at Discharge (Final Review):   Goals and Risk Factor Review - 01/05/24 1023  Core Components/Risk Factors/Patient Goals Review   Personal Goals Review Weight Management/Obesity;Stress;Lipids;Hypertension    Review [name] is off to a good start  with exercise. Vital signs have been stable. [name] has lost 1 kg since starting the program    Expected Outcomes Tommy will continue to participate in cardiac rehab for exercise, nutrition and lifestyle modifications             ITP Comments:  ITP Comments     Row Name 12/15/23 0917 12/24/23 0858 01/05/24 1012       ITP Comments Medical Director- Dr. Gaylyn Keas, MD. Introduction to the [facility] Education / Intensive Cardiac Rehab Program. Reviewed initial orientation folder with Tommy. 30 Day ITP Review. Tommy started cardiac rehab on 12/23/23. Tommy did well with exercise 30 Day ITP Review. [name] is off  to a good start to exercise at  cardiac rehab              Comments: See ITP Comments

## 2024-01-07 ENCOUNTER — Telehealth (HOSPITAL_COMMUNITY): Payer: Self-pay

## 2024-01-07 NOTE — Telephone Encounter (Signed)
 Patient c/o for 10:15 CR class tomorrow due to his daughter being sick. States he would like to make up this class and extend his grad date.

## 2024-01-08 ENCOUNTER — Encounter (HOSPITAL_COMMUNITY)

## 2024-01-13 ENCOUNTER — Encounter (HOSPITAL_COMMUNITY)
Admission: RE | Admit: 2024-01-13 | Discharge: 2024-01-13 | Disposition: A | Discharge status: Home / Self Care | Source: Ambulatory Visit | Attending: Cardiology | Admitting: Cardiology

## 2024-01-13 DIAGNOSIS — Z941 Heart transplant status: Secondary | ICD-10-CM

## 2024-01-14 ENCOUNTER — Telehealth (HOSPITAL_COMMUNITY): Payer: Self-pay

## 2024-01-14 NOTE — Telephone Encounter (Signed)
 Patient c/o for 10:15am class on Friday, states his whole household is sick and he needs to take care of them. Informed patient if he starts to feel symptoms he must be 48 hours symptom-free before returning to class, which he acknowledged.

## 2024-01-15 ENCOUNTER — Encounter (HOSPITAL_COMMUNITY)

## 2024-01-18 ENCOUNTER — Encounter (HOSPITAL_COMMUNITY)

## 2024-01-18 ENCOUNTER — Telehealth (HOSPITAL_COMMUNITY): Payer: Self-pay | Admitting: *Deleted

## 2024-01-19 ENCOUNTER — Telehealth (HOSPITAL_COMMUNITY): Payer: Self-pay

## 2024-01-19 NOTE — Telephone Encounter (Signed)
 Patient c/o for 10:15am CR class tomorrow, states he still has some cold symptoms. Hopes to be in Friday.

## 2024-01-20 ENCOUNTER — Encounter (HOSPITAL_COMMUNITY)

## 2024-01-22 ENCOUNTER — Encounter (HOSPITAL_COMMUNITY)
Admission: RE | Admit: 2024-01-22 | Discharge: 2024-01-22 | Disposition: A | Discharge status: Home / Self Care | Source: Ambulatory Visit | Attending: Cardiology | Admitting: Cardiology

## 2024-01-22 DIAGNOSIS — Z941 Heart transplant status: Secondary | ICD-10-CM | POA: Diagnosis present

## 2024-01-25 ENCOUNTER — Encounter (HOSPITAL_COMMUNITY)
Admission: RE | Admit: 2024-01-25 | Discharge: 2024-01-25 | Disposition: A | Discharge status: Home / Self Care | Source: Ambulatory Visit | Attending: Cardiology | Admitting: Cardiology

## 2024-01-25 DIAGNOSIS — Z941 Heart transplant status: Secondary | ICD-10-CM

## 2024-01-25 NOTE — Progress Notes (Signed)
 Reviewed home exercise guidelines with Tommy including endpoints, temperature precautions, target heart rate and rate of perceived exertion. Tommy walk as his mode of home exercise. Encouraged to walk 30 minutes at least 2 days/week in addition to exercise at cardiac rehab. Tommy voices understanding of instructions given.  Doree Games, MS, ACSM CEP

## 2024-01-27 ENCOUNTER — Encounter (HOSPITAL_COMMUNITY)

## 2024-01-28 ENCOUNTER — Encounter: Payer: 59 | Admitting: Internal Medicine

## 2024-01-29 ENCOUNTER — Encounter (HOSPITAL_COMMUNITY)

## 2024-02-01 ENCOUNTER — Encounter (HOSPITAL_COMMUNITY): Admission: RE | Admit: 2024-02-01 | Source: Ambulatory Visit

## 2024-02-01 ENCOUNTER — Telehealth (HOSPITAL_COMMUNITY): Payer: Self-pay | Admitting: *Deleted

## 2024-02-01 NOTE — Telephone Encounter (Signed)
 [name] came by he has continues to have a cough and a runny nose. Advised not to exercise if still having symptoms. Will cancel appointments for today and Wednesday. [name] plans to return to group exercise when he is feeling better and would like to make up the sessions he has missed if he can.Monte Antonio RN BSN

## 2024-02-03 ENCOUNTER — Encounter (HOSPITAL_COMMUNITY)

## 2024-02-03 NOTE — Progress Notes (Signed)
 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 2 tablets twice a day, Disp: , Rfl:    ASPIRIN LOW DOSE 81 MG tablet, Take 81 mg by mouth daily. (Patient not taking: Reported on 12/23/2023), Disp: , Rfl:    Calcium  Citrate-Vitamin D  (CITRACAL + D PO), Take 1 tablet by mouth 2 (two) times daily., Disp: , Rfl:    Cholecalciferol  1.25 MG (50000 UT) capsule, Take 1 capsule by mouth once a week. (Patient not taking: Reported on 12/23/2023), Disp: , Rfl:    fexofenadine (ALLEGRA) 180 MG tablet, Take 180 mg by mouth daily., Disp: , Rfl:    Fingerstix Lancets MISC, 1 each by Other route 2 (two) times daily., Disp: , Rfl:    mirtazapine (REMERON) 7.5 MG tablet, Take 7.5 mg by mouth at bedtime., Disp: , Rfl:    montelukast  (SINGULAIR ) 10 MG tablet, TAKE 1 TABLET BY MOUTH EVERYDAY AT BEDTIME, Disp: 90 tablet, Rfl: 2   Multiple Vitamin (MULITIVITAMIN WITH MINERALS) TABS, Take 1 tablet by mouth daily., Disp: , Rfl:    mycophenolate (CELLCEPT) 250 MG capsule, Take 250 mg by mouth 2 (two) times daily. 6 capsules twice a day, Disp: , Rfl:    pantoprazole (PROTONIX) 40 MG tablet, Take 40 mg by mouth daily., Disp: , Rfl:    polyethylene glycol (MIRALAX  / GLYCOLAX ) 17 g packet, Take 17 g by mouth daily as needed for moderate  constipation., Disp: , Rfl:    potassium chloride  SA (KLOR-CON  M) 20 MEQ tablet, Take 20 mEq by mouth once., Disp: , Rfl:    PRECISION QID TEST test strip, 1 strip by Other route in the morning and at bedtime., Disp: , Rfl:    predniSONE  (DELTASONE ) 5 MG tablet, Take 15 mg by mouth daily with breakfast., Disp: , Rfl:    propranolol (INDERAL) 10 MG tablet, Take 10 mg by mouth., Disp: , Rfl:    rosuvastatin (CRESTOR) 10 MG tablet, Take 10 mg by mouth daily., Disp: , Rfl:    senna-docusate (SENOKOT-S) 8.6-50 MG tablet, Take 1 tablet by mouth 2 (two) times daily., Disp: , Rfl:    sertraline  (ZOLOFT ) 50 MG tablet, Take 75 mg by mouth daily., Disp: , Rfl:    sulfamethoxazole-trimethoprim (BACTRIM) 400-80 MG tablet, Take 1 tablet by mouth daily., Disp: , Rfl:    tacrolimus (PROGRAF) 1 MG capsule, Take 1 mg by mouth 2 (two) times daily. 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   Asthma when younger   Fatty liver    History of chicken pox    History of kidney stones    MDT DVFB1D1 Visia AF MRI VR  ICD (implantable cardiac defibrillator) in place    Osteopenia    Pancreatitis    S/P orthotopic heart transplant (HCC)    At [facility] [place]   Sleep apnea    uses cpap   Ventricular tachycardia (HCC)    sotalol  therapy;  catheter ablation at [facility] 10/10 and [facility] 2013    Tobacco Use: Social History   Tobacco Use  Smoking Status Former   Current packs/day: 0.00   Average  packs/day: 1 pack/day for 18.0 years (18.0 ttl pk-yrs)   Types: Cigarettes   Start date: 01/17/1990   Quit date: 08/19/2007   Years since quitting: 16.4  Smokeless Tobacco Never    Labs: Review Flowsheet  More data exists      Latest Ref Rng & Units 09/05/2019 09/11/2020 09/17/2021 07/15/2023 10/21/2023  Labs for ITP Cardiac and Pulmonary Rehab  Cholestrol 0 - 200 mg/dL 161  096  045  - -  Direct LDL mg/dL 409.8  119.1  478.2  - -  HDL-C >39.00 mg/dL 95.62  13.08  65.78  - -  Trlycerides 0.0 - 149.0 mg/dL 469.6  295.2  841.3  - -  Hemoglobin A1c 4.6 - 6.5 % 5.6  5.5  5.8  - -  TCO2 22 - 32 mmol/L - - - 28  27     Capillary Blood Glucose: No results found for: GLUCAP   Exercise Target Goals: Exercise Program Goal: Individual exercise prescription set using results from initial 6 min walk test and THRR while considering  patient's activity barriers and safety.   Exercise Prescription Goal: Initial exercise prescription builds to 30-45 minutes a day of aerobic activity, 2-3 days per week.  Home exercise guidelines will be given to patient during program as part of exercise prescription that the participant will acknowledge.  Activity Barriers & Risk Stratification:  Activity Barriers & Cardiac Risk Stratification - 12/15/23 1005       Activity Barriers & Cardiac Risk Stratification   Activity Barriers Other (comment)    Comments Post-surgical pain: left rib cage across abdomen; hx kyphoplasty & laminectomy: chronic, mild, low back pain; osteoporosis.    Cardiac Risk Stratification High          6 Minute Walk:  6 Minute Walk     Row Name 12/15/23 1055         6 Minute Walk   Phase Initial     Distance 1321 feet     Walk Time 6 minutes     # of Rest Breaks 0     MPH 2.5     METS 4.37     RPE 10     Perceived Dyspnea  1     VO2 Peak 15.29     Symptoms Yes (comment)     Comments Mild shortness of breath     Resting HR 86 bpm     Resting BP 124/78     Resting  Oxygen Saturation  98 %     Exercise Oxygen Saturation  during 6 min walk 98 %  Max Ex. HR 91 bpm     Max Ex. BP 140/82     2 Minute Post BP 136/80        Oxygen Initial Assessment:   Oxygen Re-Evaluation:   Oxygen Discharge (Final Oxygen Re-Evaluation):   Initial Exercise Prescription:  Initial Exercise Prescription - 12/15/23 1100       Date of Initial Exercise RX and Referring Provider   Date 12/15/23    Referring Provider Floria Hurst, MD [facility])   Jacqueline Matsu, MD (coverage)   Expected Discharge Date 03/09/24      Treadmill   MPH 2.5    Grade 0    Minutes 15    METs 2.91      NuStep   Level 2    SPM 85    Minutes 15    METs 2.9      Prescription Details   Frequency (times per week) 3    Duration Progress to 30 minutes of continuous aerobic without signs/symptoms of physical distress      Intensity   THRR 40-80% of Max Heartrate 70-140    Ratings of Perceived Exertion 11-13    Perceived Dyspnea 0-4      Progression   Progression Continue to progress workloads to maintain intensity without signs/symptoms of physical distress.      Resistance Training   Training Prescription Yes    Weight 3 lbs    Reps 10-15          Perform Capillary Blood Glucose checks as needed.  Exercise Prescription Changes:   Exercise Prescription Changes     Row Name 12/23/23 1029 12/28/23 1029 01/25/24 1038         Response to Exercise   Blood Pressure (Admit) 110/82 118/78 120/66     Blood Pressure (Exercise) 122/60 124/72 128/62     Blood Pressure (Exit) 102/60 122/78 110/60     Heart Rate (Admit) 81 bpm 86 bpm 83 bpm     Heart Rate (Exercise) 95 bpm 100 bpm 108 bpm     Heart Rate (Exit) 84 bpm 95 bpm 99 bpm     Rating of Perceived Exertion (Exercise) 11.5 9 11      Symptoms None None None     Comments Off to a good start with exercise. -- Reviewed home exercise guidelines and goals with Tommy. Increased weights.     Duration Continue with 30 min of  aerobic exercise without signs/symptoms of physical distress. Continue with 30 min of aerobic exercise without signs/symptoms of physical distress. Continue with 30 min of aerobic exercise without signs/symptoms of physical distress.     Intensity THRR unchanged THRR unchanged THRR unchanged       Progression   Progression Continue to progress workloads to maintain intensity without signs/symptoms of physical distress. Continue to progress workloads to maintain intensity without signs/symptoms of physical distress. Continue to progress workloads to maintain intensity without signs/symptoms of physical distress.     Average METs 2.5 2.5 3.3       Resistance Training   Training Prescription No Yes Yes     Weight Relaxation day, no weights. 3 lbs 4 lbs     Reps -- 10-15 10-15     Time -- 10 Minutes 10 Minutes       Interval Training   Interval Training No No No       Treadmill   MPH 2.5 2.5 2.5     Grade 0 0 1     Minutes  15 15 15      METs 2.91 2.91 3.26       NuStep   Level 2 2 4      SPM 80 102 116     Minutes 15 15 15      METs 2.1 2.8 3.4       Home Exercise Plan   Plans to continue exercise at -- -- Home (comment)  Walking     Frequency -- -- Add 3 additional days to program exercise sessions.     Initial Home Exercises Provided -- -- 01/25/24        Exercise Comments:   Exercise Comments     Row Name 12/23/23 1124 01/04/24 1048 01/25/24 1117       Exercise Comments Tommy tolerated low intensity exercise well without symptoms. Reviewed goals with Tommy. Reviewed home exercise guidelines and goals with Tommy.        Exercise Goals and Review:   Exercise Goals     Row Name 12/15/23 0926             Exercise Goals   Increase Physical Activity Yes       Intervention Provide advice, education, support and counseling about physical activity/exercise needs.;Develop an individualized exercise prescription for aerobic and resistive training based on initial evaluation  findings, risk stratification, comorbidities and participant's personal goals.       Expected Outcomes Short Term: Attend rehab on a regular basis to increase amount of physical activity.;Long Term: Exercising regularly at least 3-5 days a week.;Long Term: Add in home exercise to make exercise part of routine and to increase amount of physical activity.       Increase Strength and Stamina Yes       Intervention Provide advice, education, support and counseling about physical activity/exercise needs.;Develop an individualized exercise prescription for aerobic and resistive training based on initial evaluation findings, risk stratification, comorbidities and participant's personal goals.       Expected Outcomes Short Term: Increase workloads from initial exercise prescription for resistance, speed, and METs.;Short Term: Perform resistance training exercises routinely during rehab and add in resistance training at home;Long Term: Improve cardiorespiratory fitness, muscular endurance and strength as measured by increased METs and functional capacity ( )       Able to understand and use rate of perceived exertion (RPE) scale Yes       Intervention Provide education and explanation on how to use RPE scale       Expected Outcomes Short Term: Able to use RPE daily in rehab to express subjective intensity level;Long Term:  Able to use RPE to guide intensity level when exercising independently       Knowledge and understanding of Target Heart Rate Range (THRR) Yes       Intervention Provide education and explanation of THRR including how the numbers were predicted and where they are located for reference       Expected Outcomes Short Term: Able to state/look up THRR;Long Term: Able to use THRR to govern intensity when exercising independently;Short Term: Able to use daily as guideline for intensity in rehab       Able to check pulse independently Yes       Intervention Provide education and demonstration on how  to check pulse in carotid and radial arteries.;Review the importance of being able to check your own pulse for safety during independent exercise       Expected Outcomes Short Term: Able to explain why pulse checking is important during independent  exercise;Long Term: Able to check pulse independently and accurately       Understanding of Exercise Prescription Yes       Intervention Provide education, explanation, and written materials on patient's individual exercise prescription       Expected Outcomes Short Term: Able to explain program exercise prescription;Long Term: Able to explain home exercise prescription to exercise independently          Exercise Goals Re-Evaluation :  Exercise Goals Re-Evaluation     Row Name 12/23/23 1124 01/04/24 1048 01/25/24 1117         Exercise Goal Re-Evaluation   Exercise Goals Review Increase Physical Activity;Increase Strength and Stamina;Able to understand and use rate of perceived exertion (RPE) scale Increase Physical Activity;Increase Strength and Stamina;Able to understand and use rate of perceived exertion (RPE) scale Increase Physical Activity;Increase Strength and Stamina;Able to understand and use rate of perceived exertion (RPE) scale;Knowledge and understanding of Target Heart Rate Range (THRR);Able to check pulse independently;Understanding of Exercise Prescription     Comments [name] was able to understand and use RPE scale appropriately. [name] is doing some walking at home in addition to exercise at cardiac rehab. his goal is to increase stamina and strength. Reviewed exercise prescription with Tommy. Encouraged to accumulate 150 minutes of aerobic exercise/ week. He has a heart rate monitor to check his pulse.     Expected Outcomes Progress workloads as tolerated to help improve cardiorespiratory ftness. Continue to progress workloads as tolerated to help increase stamina and strength. [name] will walk 30 minutes at least 2 days/week at home in  addition to exercise at cardiac rehab.        Discharge Exercise Prescription (Final Exercise Prescription Changes):  Exercise Prescription Changes - 01/25/24 1038       Response to Exercise   Blood Pressure (Admit) 120/66    Blood Pressure (Exercise) 128/62    Blood Pressure (Exit) 110/60    Heart Rate (Admit) 83 bpm    Heart Rate (Exercise) 108 bpm    Heart Rate (Exit) 99 bpm    Rating of Perceived Exertion (Exercise) 11    Symptoms None    Comments Reviewed home exercise guidelines and goals with Tommy. Increased weights.    Duration Continue with 30 min of aerobic exercise without signs/symptoms of physical distress.    Intensity THRR unchanged      Progression   Progression Continue to progress workloads to maintain intensity without signs/symptoms of physical distress.    Average METs 3.3      Resistance Training   Training Prescription Yes    Weight 4 lbs    Reps 10-15    Time 10 Minutes      Interval Training   Interval Training No      Treadmill   MPH 2.5    Grade 1    Minutes 15    METs 3.26      NuStep   Level 4    SPM 116    Minutes 15    METs 3.4      Home Exercise Plan   Plans to continue exercise at Home (comment)   Walking   Frequency Add 3 additional days to program exercise sessions.    Initial Home Exercises Provided 01/25/24          Nutrition:  Target Goals: Understanding of nutrition guidelines, daily intake of sodium 1500mg , cholesterol 200mg , calories 30% from fat and 7% or less from saturated fats, daily to have 5 or  more servings of fruits and vegetables.  Biometrics:  Pre Biometrics - 12/15/23 0917       Pre Biometrics   Waist Circumference 36.5 inches    Hip Circumference 40.25 inches    Waist to Hip Ratio 0.91 %    Triceps Skinfold 14 mm    % Body Fat 24.3 %    Grip Strength 42 kg    Flexibility --   Not performed due to post-surgical pain.   Single Leg Stand 30 seconds           Nutrition Therapy Plan and  Nutrition Goals:  Nutrition Therapy & Goals - 01/25/24 1055       Nutrition Therapy   Diet Heart Healthy Diet    Drug/Food Interactions Statins/Certain Fruits      Personal Nutrition Goals   Nutrition Goal Patient to identify strategies for reducing cardiovascular risk by attending the [facility] education and nutrition series weekly.   goals in progress.   Personal Goal #2 Patient to improve diet quality by using the plate method as a guide for meal planning to include lean protein/plant protein, fruits, vegetables, whole grains, nonfat dairy as part of a well-balanced diet.   goals in progress.   Comments Goals in progress. 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 He continues to attend the [facility] education/nutrition series regularly.  He continues prednisone  at this time. He is motivated to maintain weight between 170-180#; he is up ~2# since starting with our program. Lipids are well controlled. Patient will benefit from participation in intensive cardiac rehab for nutrition, exercise, and lifestyle modification.      Intervention Plan   Intervention Prescribe, educate and counsel regarding individualized specific dietary modifications aiming towards targeted core components such as weight, hypertension, lipid management, diabetes, heart failure and other comorbidities.;Nutrition handout(s) given to patient.    Expected Outcomes Short Term Goal: Understand basic principles of dietary content, such as calories, fat, sodium, cholesterol and nutrients.;Long Term Goal: Adherence to prescribed nutrition plan.          Nutrition Assessments:  Nutrition Assessments - 12/31/23 1159       Rate Your Plate Scores   Pre Score 48         MEDIFICTS Score Key: >=70 Need to make dietary changes  40-70 Heart Healthy Diet <= 40 Therapeutic Level Cholesterol Diet   Flowsheet Row INTENSIVE CARDIAC REHAB from  12/30/2023 in [facility]  Picture Your Plate Total Score on Admission 48   Picture Your Plate Scores: <91 Unhealthy dietary pattern with much room for improvement. 41-50 Dietary pattern unlikely to meet recommendations for good health and room for improvement. 51-60 More healthful dietary pattern, with some room for improvement.  >60 Healthy dietary pattern, although there may be some specific behaviors that could be improved.    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 He continues prednisone  at this time. He is motivated to  maintain weight between 170-180#. Patient will benefit from participation in intensive cardiac rehab for nutrition, exercise, and lifestyle modification. Goals in progress. 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 He continues to attend the [facility] education/nutrition series regularly. He continues prednisone  at this time. He is motivated to maintain weight between 170-180#; he is up ~2# since starting with our program. Lipids are well controlled. Patient will benefit from participation in intensive cardiac rehab for nutrition, exercise, and lifestyle modification.         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 He continues prednisone  at this time. He is motivated to maintain weight between 170-180#. Patient will benefit from participation in intensive cardiac rehab for nutrition, exercise, and lifestyle modification. Goals in progress. 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 He continues to attend the [facility] education/nutrition series regularly. He continues prednisone  at this time. He is motivated to maintain weight between 170-180#; he is up ~2# since starting with our program. Lipids are well controlled. Patient will benefit from participation in intensive cardiac rehab for nutrition, exercise, and lifestyle modification.         Nutrition Goals Discharge (Final Nutrition Goals Re-Evaluation):  Nutrition Goals Re-Evaluation - 01/25/24 1055       Goals   Current Weight 185 lb 3 oz (84 kg)    Comment HDL 47, LDL 71, triglycerides 156    Expected Outcome Goals in progress. 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 He continues to attend the [facility] education/nutrition series regularly. He continues prednisone  at this time. He is motivated to maintain weight between 170-180#; he is up ~2# since starting with our program. Lipids are well controlled. Patient will benefit from participation in intensive cardiac rehab for nutrition, exercise, and lifestyle modification.          Psychosocial: Target Goals: Acknowledge presence or absence of significant depression and/or stress, maximize coping skills, provide positive support system. Participant is able to verbalize types and ability to use techniques and skills needed for reducing stress and depression.  Initial Review & Psychosocial  Screening:  Initial Psych Review & Screening - 12/15/23 1022       Initial Review   Current issues with History of Depression;Current Anxiety/Panic;Current Psychotropic Meds;Current Depression;Current Stress Concerns    Source of Stress Concerns Family;Chronic Illness      Family Dynamics   Good Support System? Yes   [name] has his wife Tanis Fan and Aunt for support   Comments [name] has PTSD and is receiving counseling every 2-3 weeks virtually via [facility]. Tommy admits to being currently depressed and experiences anxiety. [name] says that the antidpressant and antianxiety medications he is currently taking is working.      Barriers   Psychosocial barriers to participate in program The patient should benefit from training in stress management and relaxation.  Screening Interventions   Interventions Encouraged to exercise;To provide support and resources with identified psychosocial needs;Provide feedback about the scores to participant    Expected Outcomes Long Term Goal: Stressors or current issues are controlled or eliminated.;Short Term goal: Identification and review with participant of any Quality of Life or Depression concerns found by scoring the questionnaire.;Long Term goal: The participant improves quality of Life and PHQ9 Scores as seen by post scores and/or verbalization of changes          Quality of Life Scores:  Quality of Life - 12/15/23 1151       Quality of Life   Select Quality of Life      Quality of Life Scores   Health/Function Pre 11.3 %    Socioeconomic Pre 21.07 %    Psych/Spiritual Pre 15.07 %    Family Pre 27.6 %    GLOBAL Pre 16.49 %         Scores of 19 and below usually indicate a poorer quality of life in these areas.  A difference of  2-3 points is a clinically meaningful difference.  A difference of 2-3 points in the total score of the Quality of Life Index has been associated with significant improvement in overall quality of life, self-image,  physical symptoms, and general health in studies assessing change in quality of life.  PHQ-9: Review Flowsheet  More data exists      12/15/2023 09/30/2023 08/31/2023 07/21/2023 04/15/2023  Depression screen PHQ 2/9  Decreased Interest 0 2 1 3 2   Down, Depressed, Hopeless 1 2 1 3 2   PHQ - 2 Score 1 4 2 6 4   Altered sleeping 1 2 1 3 1   Tired, decreased energy 1 3 2 3 3   Change in appetite 0 1 0 2 1  Feeling bad or failure about yourself  1 2 1 3  0  Trouble concentrating 1 1 1 3 3   Moving slowly or fidgety/restless 0 1 1 2 1   Suicidal thoughts 0 0 0 0 0  PHQ-9 Score 5 14 8 22 13   Difficult doing work/chores Extremely dIfficult Extremely dIfficult Extremely dIfficult Extremely dIfficult Very difficult   Interpretation of Total Score  Total Score Depression Severity:  1-4 = Minimal depression, 5-9 = Mild depression, 10-14 = Moderate depression, 15-19 = Moderately severe depression, 20-27 = Severe depression   Psychosocial Evaluation and Intervention:   Psychosocial Re-Evaluation:  Psychosocial Re-Evaluation     Row Name 12/24/23 0902 01/05/24 1021 02/01/24 0801         Psychosocial Re-Evaluation   Current issues with History of Depression;Current Depression;Current Stress Concerns;Current Anxiety/Panic;Current Psychotropic Meds History of Depression;Current Depression;Current Stress Concerns;Current Anxiety/Panic;Current Psychotropic Meds History of Depression;Current Depression;Current Stress Concerns;Current Anxiety/Panic;Current Psychotropic Meds     Comments [name] was 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 Will review quality of life questionnaire in the upcoming week Quality of life and PHQ9 reviewed.Tommy admits to being dissatisfied with his health due to his recent heart transplant surgery and is experiencing some depression. [name] says his still gets fatigued easily which is a challenge since he has a  family with small children. [name] has good support from his wife. [name] is currently receiving  virtual counseling from  [facility]. [name] al;so sees a psychiatrist. [name] had a biopsy last week. [name] says his rejection risk has been downgraded which is reassuring for Tommy. [name] has not voiced any increased concerns or  stressors during exercise at cardiac rehab.     Expected Outcomes Tommy will have controlled or decreased depression / anxiet and stressors upon return to exercise at cardiac rehab Tommy will have controlled or decreased depression / anxiet and stressors upon return to exercise at cardiac rehab Tommy will have controlled or decreased depression / anxiet and stressors upon return to exercise at cardiac rehab     Interventions Stress management education;Encouraged to attend Cardiac Rehabilitation for the exercise;Relaxation education Stress management education;Encouraged to attend Cardiac Rehabilitation for the exercise;Relaxation education Stress management education;Encouraged to attend Cardiac Rehabilitation for the exercise;Relaxation education     Continue Psychosocial Services  Follow up required by staff Follow up required by staff No Follow up required       Initial Review   Source of Stress Concerns Chronic Illness;Unable to participate in former interests or hobbies;Unable to perform yard/household activities Chronic Illness;Unable to participate in former interests or hobbies;Unable to perform yard/household activities Chronic Illness;Unable to participate in former interests or hobbies;Unable to perform yard/household activities     Comments Will continue to monitor and offer support as needed Will continue to monitor and offer support as needed Will continue to monitor and offer support as needed        Psychosocial Discharge (Final Psychosocial Re-Evaluation):  Psychosocial Re-Evaluation - 02/01/24 0801       Psychosocial Re-Evaluation   Current issues with History of  Depression;Current Depression;Current Stress Concerns;Current Anxiety/Panic;Current Psychotropic Meds    Comments [name] has not voiced any increased concerns or stressors during exercise at cardiac rehab.    Expected Outcomes Tommy will have controlled or decreased depression / anxiet and stressors upon return to exercise at cardiac rehab    Interventions Stress management education;Encouraged to attend Cardiac Rehabilitation for the exercise;Relaxation education    Continue Psychosocial Services  No Follow up required      Initial Review   Source of Stress Concerns Chronic Illness;Unable to participate in former interests or hobbies;Unable to perform yard/household activities    Comments Will continue to monitor and offer support as needed          Vocational Rehabilitation: Provide vocational rehab assistance to qualifying candidates.   Vocational Rehab Evaluation & Intervention:  Vocational Rehab - 12/15/23 1030       Initial Vocational Rehab Evaluation & Intervention   Assessment shows need for Vocational Rehabilitation No   [name] is currently on LOA and does not need vocational rehab at this time         Education: Education Goals: Education classes will be provided on a weekly basis, covering required topics. Participant will state understanding/return demonstration of topics presented.    Education     Row Name 12/23/23 1400     Education   Cardiac Education Topics Pritikin   Orthoptist   Educator Dietitian   Weekly Topic Adding Flavor - Sodium-Free   Instruction Review Code 1- Verbalizes Understanding   Class Start Time 1145   Class Stop Time 1221   Class Time Calculation (min) 36 min    Row Name 12/28/23 1300     Education   Cardiac Education Topics Pritikin   Glass blower/designer Nutrition   Nutrition Workshop Label Reading   Instruction Review Code 1- Verbalizes Understanding    Class Start Time 1145   Class Stop Time 1228   Class Time Calculation (min) 43  min    Row Name 01/04/24 1200     Education   Cardiac Education Topics Pritikin   Hospital doctor Education   General Education Metabolic Syndrome and Belly Fat   Instruction Review Code 1- Verbalizes Understanding   Class Start Time 1150   Class Stop Time 1230   Class Time Calculation (min) 40 min    Row Name 01/06/24 1400     Education   Cardiac Education Topics Pritikin   Orthoptist   Educator Dietitian   Weekly Topic Personalizing Your Pritikin Plate   Instruction Review Code 1- Verbalizes Understanding   Class Start Time 1145   Class Stop Time 1220   Class Time Calculation (min) 35 min    Row Name 01/13/24 1500     Education   Cardiac Education Topics Pritikin   Orthoptist   Educator Dietitian   Weekly Topic [employer] Desserts   Instruction Review Code 1- Verbalizes Understanding   Class Start Time 1145   Class Stop Time 1225   Class Time Calculation (min) 40 min    Row Name 01/22/24 1000     Education   Cardiac Education Topics Pritikin   Psychologist, forensic Exercise Education   Exercise Education Move It!   Instruction Review Code 1- Verbalizes Understanding   Class Start Time 1017   Class Stop Time 1050   Class Time Calculation (min) 33 min      Core Videos: Exercise    Move It!  Clinical staff conducted group or individual video education with verbal and written material and guidebook.  Patient learns the recommended Pritikin exercise program. Exercise with the goal of living a long, healthy life. Some of the health benefits of exercise include controlled diabetes, healthier blood pressure levels, improved cholesterol levels, improved heart and lung capacity, improved sleep, and  better body composition. Everyone should speak with their doctor before starting or changing an exercise routine.  Biomechanical Limitations Clinical staff conducted group or individual video education with verbal and written material and guidebook.  Patient learns how biomechanical limitations can impact exercise and how we can mitigate and possibly overcome limitations to have an impactful and balanced exercise routine.  Body Composition Clinical staff conducted group or individual video education with verbal and written material and guidebook.  Patient learns that body composition (ratio of muscle mass to fat mass) is a key component to assessing overall fitness, rather than body weight alone. Increased fat mass, especially visceral belly fat, can put us  at increased risk for metabolic syndrome, type 2 diabetes, heart disease, and even death. It is recommended to combine diet and exercise (cardiovascular and resistance training) to improve your body composition. Seek guidance from your physician and exercise physiologist before implementing an exercise routine.  Exercise Action Plan Clinical staff conducted group or individual video education with verbal and written material and guidebook.  Patient learns the recommended strategies to achieve and enjoy long-term exercise adherence, including variety, self-motivation, self-efficacy, and positive decision making. Benefits of exercise include fitness, good health, weight management, more energy, better sleep, less stress, and overall well-being.  Medical   Heart Disease Risk Reduction Clinical staff conducted group or individual video education with verbal and written material and guidebook.  Patient  learns our heart is our most vital organ as it circulates oxygen, nutrients, white blood cells, and hormones throughout the entire body, and carries waste away. Data supports a plant-based eating plan like the Pritikin Program for its effectiveness in  slowing progression of and reversing heart disease. The video provides a number of recommendations to address heart disease.   Metabolic Syndrome and Belly Fat  Clinical staff conducted group or individual video education with verbal and written material and guidebook.  Patient learns what metabolic syndrome is, how it leads to heart disease, and how one can reverse it and keep it from coming back. You have metabolic syndrome if you have 3 of the following 5 criteria: abdominal obesity, high blood pressure, high triglycerides, low HDL cholesterol, and high blood sugar.  Hypertension and Heart Disease Clinical staff conducted group or individual video education with verbal and written material and guidebook.  Patient learns that high blood pressure, or hypertension, is very common in the [place] . Hypertension is largely due to excessive salt intake, but other important risk factors include being overweight, physical inactivity, drinking too much alcohol, smoking, and not eating enough potassium from fruits and vegetables. High blood pressure is a leading risk factor for heart attack, stroke, congestive heart failure, dementia, kidney failure, and premature death. Long-term effects of excessive salt intake include stiffening of the arteries and thickening of heart muscle and organ damage. Recommendations include ways to reduce hypertension and the risk of heart disease.  Diseases of Our Time - Focusing on Diabetes Clinical staff conducted group or individual video education with verbal and written material and guidebook.  Patient learns why the best way to stop diseases of our time is prevention, through food and other lifestyle changes. Medicine (such as prescription pills and surgeries) is often only a Band-Aid on the problem, not a long-term solution. Most common diseases of our time include obesity, type 2 diabetes, hypertension, heart disease, and cancer. The Pritikin Program is recommended and  has been proven to help reduce, reverse, and/or prevent the damaging effects of metabolic syndrome.  Nutrition   Overview of the Pritikin Eating Plan  Clinical staff conducted group or individual video education with verbal and written material and guidebook.  Patient learns about the Pritikin Eating Plan for disease risk reduction. The Pritikin Eating Plan emphasizes a wide variety of unrefined, minimally-processed carbohydrates, like fruits, vegetables, whole grains, and legumes. Go, Caution, and Stop food choices are explained. Plant-based and lean animal proteins are emphasized. Rationale provided for low sodium intake for blood pressure control, low added sugars for blood sugar stabilization, and low added fats and oils for coronary artery disease risk reduction and weight management.  Calorie Density  Clinical staff conducted group or individual video education with verbal and written material and guidebook.  Patient learns about calorie density and how it impacts the Pritikin Eating Plan. Knowing the characteristics of the food you choose will help you decide whether those foods will lead to weight gain or weight loss, and whether you want to consume more or less of them. Weight loss is usually a side effect of the Pritikin Eating Plan because of its focus on low calorie-dense foods.  Label Reading  Clinical staff conducted group or individual video education with verbal and written material and guidebook.  Patient learns about the Pritikin recommended label reading guidelines and corresponding recommendations regarding calorie density, added sugars, sodium content, and whole grains.  Dining Out - Part 1  Clinical staff conducted group  or individual video education with verbal and written material and guidebook.  Patient learns that restaurant meals can be sabotaging because they can be so high in calories, fat, sodium, and/or sugar. Patient learns recommended strategies on how to positively  address this and avoid unhealthy pitfalls.  Facts on Fats  Clinical staff conducted group or individual video education with verbal and written material and guidebook.  Patient learns that lifestyle modifications can be just as effective, if not more so, as many medications for lowering your risk of heart disease. A Pritikin lifestyle can help to reduce your risk of inflammation and atherosclerosis (cholesterol build-up, or plaque, in the artery walls). Lifestyle interventions such as dietary choices and physical activity address the cause of atherosclerosis. A review of the types of fats and their impact on blood cholesterol levels, along with dietary recommendations to reduce fat intake is also included.  Nutrition Action Plan  Clinical staff conducted group or individual video education with verbal and written material and guidebook.  Patient learns how to incorporate Pritikin recommendations into their lifestyle. Recommendations include planning and keeping personal health goals in mind as an important part of their success.  Healthy Mind-Set    Healthy Minds, Bodies, Hearts  Clinical staff conducted group or individual video education with verbal and written material and guidebook.  Patient learns how to identify when they are stressed. Video will discuss the impact of that stress, as well as the many benefits of stress management. Patient will also be introduced to stress management techniques. The way we think, act, and feel has an impact on our hearts.  How Our Thoughts Can Heal Our Hearts  Clinical staff conducted group or individual video education with verbal and written material and guidebook.  Patient learns that negative thoughts can cause depression and anxiety. This can result in negative lifestyle behavior and serious health problems. Cognitive behavioral therapy is an effective method to help control our thoughts in order to change and improve our emotional outlook.  Additional  Videos:  Exercise    Improving Performance  Clinical staff conducted group or individual video education with verbal and written material and guidebook.  Patient learns to use a non-linear approach by alternating intensity levels and lengths of time spent exercising to help burn more calories and lose more body fat. Cardiovascular exercise helps improve heart health, metabolism, hormonal balance, blood sugar control, and recovery from fatigue. Resistance training improves strength, endurance, balance, coordination, reaction time, metabolism, and muscle mass. Flexibility exercise improves circulation, posture, and balance. Seek guidance from your physician and exercise physiologist before implementing an exercise routine and learn your capabilities and proper form for all exercise.  Introduction to Yoga  Clinical staff conducted group or individual video education with verbal and written material and guidebook.  Patient learns about yoga, a discipline of the coming together of mind, breath, and body. The benefits of yoga include improved flexibility, improved range of motion, better posture and core strength, increased lung function, weight loss, and positive self-image. Yoga's heart health benefits include lowered blood pressure, healthier heart rate, decreased cholesterol and triglyceride levels, improved immune function, and reduced stress. Seek guidance from your physician and exercise physiologist before implementing an exercise routine and learn your capabilities and proper form for all exercise.  Medical   Aging: Enhancing Your Quality of Life  Clinical staff conducted group or individual video education with verbal and written material and guidebook.  Patient learns key strategies and recommendations to stay in good physical health  and enhance quality of life, such as prevention strategies, having an advocate, securing a Health Care Proxy and Power of Attorney, and keeping a list of medications  and system for tracking them. It also discusses how to avoid risk for bone loss.  Biology of Weight Control  Clinical staff conducted group or individual video education with verbal and written material and guidebook.  Patient learns that weight gain occurs because we consume more calories than we burn (eating more, moving less). Even if your body weight is normal, you may have higher ratios of fat compared to muscle mass. Too much body fat puts you at increased risk for cardiovascular disease, heart attack, stroke, type 2 diabetes, and obesity-related cancers. In addition to exercise, following the Pritikin Eating Plan can help reduce your risk.  Decoding Lab Results  Clinical staff conducted group or individual video education with verbal and written material and guidebook.  Patient learns that lab test reflects one measurement whose values change over time and are influenced by many factors, including medication, stress, sleep, exercise, food, hydration, pre-existing medical conditions, and more. It is recommended to use the knowledge from this video to become more involved with your lab results and evaluate your numbers to speak with your doctor.   Diseases of Our Time - Overview  Clinical staff conducted group or individual video education with verbal and written material and guidebook.  Patient learns that according to the CDC, 50% to 70% of chronic diseases (such as obesity, type 2 diabetes, elevated lipids, hypertension, and heart disease) are avoidable through lifestyle improvements including healthier food choices, listening to satiety cues, and increased physical activity.  Sleep Disorders Clinical staff conducted group or individual video education with verbal and written material and guidebook.  Patient learns how good quality and duration of sleep are important to overall health and well-being. Patient also learns about sleep disorders and how they impact health along with  recommendations to address them, including discussing with a physician.  Nutrition  Dining Out - Part 2 Clinical staff conducted group or individual video education with verbal and written material and guidebook.  Patient learns how to plan ahead and communicate in order to maximize their dining experience in a healthy and nutritious manner. Included are recommended food choices based on the type of restaurant the patient is visiting.   Fueling a Banker conducted group or individual video education with verbal and written material and guidebook.  There is a strong connection between our food choices and our health. Diseases like obesity and type 2 diabetes are very prevalent and are in large-part due to lifestyle choices. The Pritikin Eating Plan provides plenty of food and hunger-curbing satisfaction. It is easy to follow, affordable, and helps reduce health risks.  Menu Workshop  Clinical staff conducted group or individual video education with verbal and written material and guidebook.  Patient learns that restaurant meals can sabotage health goals because they are often packed with calories, fat, sodium, and sugar. Recommendations include strategies to plan ahead and to communicate with the manager, chef, or server to help order a healthier meal.  Planning Your Eating Strategy  Clinical staff conducted group or individual video education with verbal and written material and guidebook.  Patient learns about the Pritikin Eating Plan and its benefit of reducing the risk of disease. The Pritikin Eating Plan does not focus on calories. Instead, it emphasizes high-quality, nutrient-rich foods. By knowing the characteristics of the foods, we choose, we can  determine their calorie density and make informed decisions.  Targeting Your Nutrition Priorities  Clinical staff conducted group or individual video education with verbal and written material and guidebook.  Patient learns  that lifestyle habits have a tremendous impact on disease risk and progression. This video provides eating and physical activity recommendations based on your personal health goals, such as reducing LDL cholesterol, losing weight, preventing or controlling type 2 diabetes, and reducing high blood pressure.  Vitamins and Minerals  Clinical staff conducted group or individual video education with verbal and written material and guidebook.  Patient learns different ways to obtain key vitamins and minerals, including through a recommended healthy diet. It is important to discuss all supplements you take with your doctor.   Healthy Mind-Set    Smoking Cessation  Clinical staff conducted group or individual video education with verbal and written material and guidebook.  Patient learns that cigarette smoking and tobacco addiction pose a serious health risk which affects millions of people. Stopping smoking will significantly reduce the risk of heart disease, lung disease, and many forms of cancer. Recommended strategies for quitting are covered, including working with your doctor to develop a successful plan.  Culinary   Becoming a Set designer conducted group or individual video education with verbal and written material and guidebook.  Patient learns that cooking at home can be healthy, cost-effective, quick, and puts them in control. Keys to cooking healthy recipes will include looking at your recipe, assessing your equipment needs, planning ahead, making it simple, choosing cost-effective seasonal ingredients, and limiting the use of added fats, salts, and sugars.  Cooking - Breakfast and Snacks  Clinical staff conducted group or individual video education with verbal and written material and guidebook.  Patient learns how important breakfast is to satiety and nutrition through the entire day. Recommendations include key foods to eat during breakfast to help stabilize blood sugar  levels and to prevent overeating at meals later in the day. Planning ahead is also a key component.  Cooking - Educational psychologist conducted group or individual video education with verbal and written material and guidebook.  Patient learns eating strategies to improve overall health, including an approach to cook more at home. Recommendations include thinking of animal protein as a side on your plate rather than center stage and focusing instead on lower calorie dense options like vegetables, fruits, whole grains, and plant-based proteins, such as beans. Making sauces in large quantities to freeze for later and leaving the skin on your vegetables are also recommended to maximize your experience.  Cooking - Healthy Salads and Dressing Clinical staff conducted group or individual video education with verbal and written material and guidebook.  Patient learns that vegetables, fruits, whole grains, and legumes are the foundations of the Pritikin Eating Plan. Recommendations include how to incorporate each of these in flavorful and healthy salads, and how to create homemade salad dressings. Proper handling of ingredients is also covered. Cooking - Soups and [employer] - Soups and Desserts Clinical staff conducted group or individual video education with verbal and written material and guidebook.  Patient learns that Pritikin soups and desserts make for easy, nutritious, and delicious snacks and meal components that are low in sodium, fat, sugar, and calorie density, while high in vitamins, minerals, and filling fiber. Recommendations include simple and healthy ideas for soups and desserts.   Overview     The Pritikin Solution Program Overview Clinical staff conducted group or  individual video education with verbal and written material and guidebook.  Patient learns that the results of the Pritikin Program have been documented in more than 100 articles published in peer-reviewed  journals, and the benefits include reducing risk factors for (and, in some cases, even reversing) high cholesterol, high blood pressure, type 2 diabetes, obesity, and more! An overview of the three key pillars of the Pritikin Program will be covered: eating well, doing regular exercise, and having a healthy mind-set.  WORKSHOPS  Exercise: Exercise Basics: Building Your Action Plan Clinical staff led group instruction and group discussion with PowerPoint presentation and patient guidebook. To enhance the learning environment the use of posters, models and videos may be added. At the conclusion of this workshop, patients will comprehend the difference between physical activity and exercise, as well as the benefits of incorporating both, into their routine. Patients will understand the FITT (Frequency, Intensity, Time, and Type) principle and how to use it to build an exercise action plan. In addition, safety concerns and other considerations for exercise and cardiac rehab will be addressed by the presenter. The purpose of this lesson is to promote a comprehensive and effective weekly exercise routine in order to improve patients' overall level of fitness.   Managing Heart Disease: Your Path to a Healthier Heart Clinical staff led group instruction and group discussion with PowerPoint presentation and patient guidebook. To enhance the learning environment the use of posters, models and videos may be added.At the conclusion of this workshop, patients will understand the anatomy and physiology of the heart. Additionally, they will understand how Pritikin's three pillars impact the risk factors, the progression, and the management of heart disease.  The purpose of this lesson is to provide a high-level overview of the heart, heart disease, and how the Pritikin lifestyle positively impacts risk factors.  Exercise Biomechanics Clinical staff led group instruction and group discussion with PowerPoint  presentation and patient guidebook. To enhance the learning environment the use of posters, models and videos may be added. Patients will learn how the structural parts of their bodies function and how these functions impact their daily activities, movement, and exercise. Patients will learn how to promote a neutral spine, learn how to manage pain, and identify ways to improve their physical movement in order to promote healthy living. The purpose of this lesson is to expose patients to common physical limitations that impact physical activity. Participants will learn practical ways to adapt and manage aches and pains, and to minimize their effect on regular exercise. Patients will learn how to maintain good posture while sitting, walking, and lifting.  Balance Training and Fall Prevention  Clinical staff led group instruction and group discussion with PowerPoint presentation and patient guidebook. To enhance the learning environment the use of posters, models and videos may be added. At the conclusion of this workshop, patients will understand the importance of their sensorimotor skills (vision, proprioception, and the vestibular system) in maintaining their ability to balance as they age. Patients will apply a variety of balancing exercises that are appropriate for their current level of function. Patients will understand the common causes for poor balance, possible solutions to these problems, and ways to modify their physical environment in order to minimize their fall risk. The purpose of this lesson is to teach patients about the importance of maintaining balance as they age and ways to minimize their risk of falling.  WORKSHOPS   Nutrition:  Fueling a Ship broker led group instruction and group  discussion with PowerPoint presentation and patient guidebook. To enhance the learning environment the use of posters, models and videos may be added. Patients will review the  foundational principles of the Pritikin Eating Plan and understand what constitutes a serving size in each of the food groups. Patients will also learn Pritikin-friendly foods that are better choices when away from home and review make-ahead meal and snack options. Calorie density will be reviewed and applied to three nutrition priorities: weight maintenance, weight loss, and weight gain. The purpose of this lesson is to reinforce (in a group setting) the key concepts around what patients are recommended to eat and how to apply these guidelines when away from home by planning and selecting Pritikin-friendly options. Patients will understand how calorie density may be adjusted for different weight management goals.  Mindful Eating  Clinical staff led group instruction and group discussion with PowerPoint presentation and patient guidebook. To enhance the learning environment the use of posters, models and videos may be added. Patients will briefly review the concepts of the Pritikin Eating Plan and the importance of low-calorie dense foods. The concept of mindful eating will be introduced as well as the importance of paying attention to internal hunger signals. Triggers for non-hunger eating and techniques for dealing with triggers will be explored. The purpose of this lesson is to provide patients with the opportunity to review the basic principles of the Pritikin Eating Plan, discuss the value of eating mindfully and how to measure internal cues of hunger and fullness using the Hunger Scale. Patients will also discuss reasons for non-hunger eating and learn strategies to use for controlling emotional eating.  Targeting Your Nutrition Priorities Clinical staff led group instruction and group discussion with PowerPoint presentation and patient guidebook. To enhance the learning environment the use of posters, models and videos may be added. Patients will learn how to determine their genetic susceptibility to  disease by reviewing their family history. Patients will gain insight into the importance of diet as part of an overall healthy lifestyle in mitigating the impact of genetics and other environmental insults. The purpose of this lesson is to provide patients with the opportunity to assess their personal nutrition priorities by looking at their family history, their own health history and current risk factors. Patients will also be able to discuss ways of prioritizing and modifying the Pritikin Eating Plan for their highest risk areas  Menu  Clinical staff led group instruction and group discussion with PowerPoint presentation and patient guidebook. To enhance the learning environment the use of posters, models and videos may be added. Using menus brought in from [employer], or printed from [employer], patients will apply the Pritikin dining out guidelines that were presented in the [employer] video. Patients will also be able to practice these guidelines in a variety of provided scenarios. The purpose of this lesson is to provide patients with the opportunity to practice hands-on learning of the Pritikin Dining Out guidelines with actual menus and practice scenarios.  Label Reading Clinical staff led group instruction and group discussion with PowerPoint presentation and patient guidebook. To enhance the learning environment the use of posters, models and videos may be added. Patients will review and discuss the Pritikin label reading guidelines presented in [facility]in's Label Reading Educational series video. Using fool labels brought in from local grocery stores and markets, patients will apply the label reading guidelines and determine if the packaged food meet the Pritikin guidelines. The purpose of this lesson is  to provide patients with the opportunity to review, discuss, and practice hands-on learning of the Pritikin Label Reading guidelines with actual packaged food  labels. Cooking School  Pritikin's [employer] are designed to teach patients ways to prepare quick, simple, and affordable recipes at home. The importance of nutrition's role in chronic disease risk reduction is reflected in its emphasis in the overall Pritikin program. By learning how to prepare essential core Pritikin Eating Plan recipes, patients will increase control over what they eat; be able to customize the flavor of foods without the use of added salt, sugar, or fat; and improve the quality of the food they consume. By learning a set of core recipes which are easily assembled, quickly prepared, and affordable, patients are more likely to prepare more healthy foods at home. These workshops focus on convenient breakfasts, simple entres, side dishes, and desserts which can be prepared with minimal effort and are consistent with nutrition recommendations for cardiovascular risk reduction. Cooking [employer] are taught by a Armed forces logistics/support/administrative officer (RD) who has been trained by the [employer]. The chef or RD has a clear understanding of the importance of minimizing - if not completely eliminating - added fat, sugar, and sodium in recipes. Throughout the series of Cooking School Workshop sessions, patients will learn about healthy ingredients and efficient methods of cooking to build confidence in their capability to prepare    Cooking School weekly topics:  Adding Flavor- Sodium-Free  Fast and Healthy Breakfasts  Powerhouse Plant-Based Proteins  Satisfying Salads and Dressings  Simple Sides and Sauces  International Cuisine-Spotlight on the [employer] Zones  Delicious Desserts  Savory Soups  [employer] - Meals in a Astronomer Appetizers and Snacks  Comforting Weekend Breakfasts  One-Pot Wonders   Fast Evening Meals  Landscape architect Your Pritikin Plate  WORKSHOPS   Healthy Mindset (Psychosocial):  Focused Goals, Sustainable  Changes Clinical staff led group instruction and group discussion with PowerPoint presentation and patient guidebook. To enhance the learning environment the use of posters, models and videos may be added. Patients will be able to apply effective goal setting strategies to establish at least one personal goal, and then take consistent, meaningful action toward that goal. They will learn to identify common barriers to achieving personal goals and develop strategies to overcome them. Patients will also gain an understanding of how our mind-set can impact our ability to achieve goals and the importance of cultivating a positive and growth-oriented mind-set. The purpose of this lesson is to provide patients with a deeper understanding of how to set and achieve personal goals, as well as the tools and strategies needed to overcome common obstacles which may arise along the way.  From Head to Heart: The Power of a Healthy Outlook  Clinical staff led group instruction and group discussion with PowerPoint presentation and patient guidebook. To enhance the learning environment the use of posters, models and videos may be added. Patients will be able to recognize and describe the impact of emotions and mood on physical health. They will discover the importance of self-care and explore self-care practices which may work for them. Patients will also learn how to utilize the 4 C's to cultivate a healthier outlook and better manage stress and challenges. The purpose of this lesson is to demonstrate to patients how a healthy outlook is an essential part of maintaining good health, especially as they continue their cardiac rehab journey.  Healthy Sleep for a Healthy  Heart Clinical staff led group instruction and group discussion with PowerPoint presentation and patient guidebook. To enhance the learning environment the use of posters, models and videos may be added. At the conclusion of this workshop, patients will be able  to demonstrate knowledge of the importance of sleep to overall health, well-being, and quality of life. They will understand the symptoms of, and treatments for, common sleep disorders. Patients will also be able to identify daytime and nighttime behaviors which impact sleep, and they will be able to apply these tools to help manage sleep-related challenges. The purpose of this lesson is to provide patients with a general overview of sleep and outline the importance of quality sleep. Patients will learn about a few of the most common sleep disorders. Patients will also be introduced to the concept of "sleep hygiene," and discover ways to self-manage certain sleeping problems through simple daily behavior changes. Finally, the workshop will motivate patients by clarifying the links between quality sleep and their goals of heart-healthy living.   Recognizing and Reducing Stress Clinical staff led group instruction and group discussion with PowerPoint presentation and patient guidebook. To enhance the learning environment the use of posters, models and videos may be added. At the conclusion of this workshop, patients will be able to understand the types of stress reactions, differentiate between acute and chronic stress, and recognize the impact that chronic stress has on their health. They will also be able to apply different coping mechanisms, such as reframing negative self-talk. Patients will have the opportunity to practice a variety of stress management techniques, such as deep abdominal breathing, progressive muscle relaxation, and/or guided imagery.  The purpose of this lesson is to educate patients on the role of stress in their lives and to provide healthy techniques for coping with it.  Learning Barriers/Preferences:  Learning Barriers/Preferences - 12/15/23 1029       Learning Barriers/Preferences   Learning Barriers Exercise Concerns   Chronic lower back pain,  occasional lightheaded when  changing postions   Learning Preferences Pictoral;Skilled Demonstration          Education Topics:  Knowledge Questionnaire Score:  Knowledge Questionnaire Score - 12/15/23 1152       Knowledge Questionnaire Score   Pre Score 23/24          Core Components/Risk Factors/Patient Goals at Admission:  Personal Goals and Risk Factors at Admission - 12/15/23 1013       Core Components/Risk Factors/Patient Goals on Admission    Weight Management Weight Loss;Yes    Intervention Weight Management: Provide education and appropriate resources to help participant work on and attain dietary goals.;Weight Management: Develop a combined nutrition and exercise program designed to reach desired caloric intake, while maintaining appropriate intake of nutrient and fiber, sodium and fats, and appropriate energy expenditure required for the weight goal.    Admit Weight 183 lb 1.6 oz (83.1 kg)    Goal Weight: Short Term 180 lb (81.6 kg)    Goal Weight: Long Term 170 lb (77.1 kg)    Expected Outcomes Short Term: Continue to assess and modify interventions until short term weight is achieved;Long Term: Adherence to nutrition and physical activity/exercise program aimed toward attainment of established weight goal;Weight Loss: Understanding of general recommendations for a balanced deficit meal plan, which promotes 1-2 lb weight loss per week and includes a negative energy balance of [phone] kcal/d    Hypertension Yes    Intervention Provide education on lifestyle modifcations including regular physical activity/exercise, weight  management, moderate sodium restriction and increased consumption of fresh fruit, vegetables, and low fat dairy, alcohol moderation, and smoking cessation.;Monitor prescription use compliance.    Expected Outcomes Short Term: Continued assessment and intervention until BP is < 140/59mm HG in hypertensive participants. < 130/4mm HG in hypertensive participants with diabetes, heart  failure or chronic kidney disease.;Long Term: Maintenance of blood pressure at goal levels.    Lipids Yes    Intervention Provide education and support for participant on nutrition & aerobic/resistive exercise along with prescribed medications to achieve LDL 70mg , HDL >40mg .    Expected Outcomes Short Term: Participant states understanding of desired cholesterol values and is compliant with medications prescribed. Participant is following exercise prescription and nutrition guidelines.;Long Term: Cholesterol controlled with medications as prescribed, with individualized exercise RX and with personalized nutrition plan. Value goals: LDL < 70mg , HDL > 40 mg.    Stress Yes    Intervention Offer individual and/or small group education and counseling on adjustment to heart disease, stress management and health-related lifestyle change. Teach and support self-help strategies.;Refer participants experiencing significant psychosocial distress to appropriate mental health specialists for further evaluation and treatment. When possible, include family members and significant others in education/counseling sessions.    Expected Outcomes Short Term: Participant demonstrates changes in health-related behavior, relaxation and other stress management skills, ability to obtain effective social support, and compliance with psychotropic medications if prescribed.;Long Term: Emotional wellbeing is indicated by absence of clinically significant psychosocial distress or social isolation.    Personal Goal Other Yes    Personal Goal Decrease BMI. Maintain weight between 170-180 lbs. Eat more fruits and vegetables, less salt. Reduce/ remove anxiety medication. Maintain or reduce depression medication. Meditate more.    Intervention [name] will attend Exercise, Nutrtion, and Healthy Mind-Set workshops to help achieve personal health and fitness goals.    Expected Outcomes Tommy will achieve and maintain weight goal. He will know  and practice meditation techniques. He will incorporate more fruits and vegetables in his diet. He will develop and Exercise Action Plan he can maintain to increase strength and stamina as measured by self-report and grip strenght and walk tests.          Core Components/Risk Factors/Patient Goals Review:   Goals and Risk Factor Review     Row Name 12/24/23 0908 01/05/24 1023 02/01/24 0806         Core Components/Risk Factors/Patient Goals Review   Personal Goals Review Weight Management/Obesity;Stress;Lipids;Hypertension Weight Management/Obesity;Stress;Lipids;Hypertension Weight Management/Obesity;Stress;Lipids;Hypertension     Review Tommy started cardiac rehab on 12/23/23. Tommy did well with exercise. Vital signs were stable. 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 Vital signs have been stable. [name] has lost 1 kg since starting the program [name] is doing  well with exercise at cardiac rehab. Vital signs have been stable. [name] has been increasing his workloads.     Expected Outcomes Tommy will continue to participate in cardiac rehab for exercise, nutrition and lifestyle modifications [name] will continue to participate in cardiac rehab for exercise, nutrition and lifestyle modifications [name] will continue to participate in cardiac rehab for exercise, nutrition and lifestyle modifications        Core Components/Risk Factors/Patient Goals at Discharge (Final Review):   Goals and Risk Factor Review - 02/01/24 0806       Core Components/Risk Factors/Patient Goals Review   Personal Goals Review Weight Management/Obesity;Stress;Lipids;Hypertension  Review [name] is doing  well with exercise at cardiac rehab. Vital signs have been stable. [name] has been increasing his workloads.    Expected Outcomes Tommy will continue to participate in cardiac rehab for exercise, nutrition  and lifestyle modifications          ITP Comments:  ITP Comments     Row Name 12/15/23 2956 12/24/23 0858 01/05/24 1012 02/01/24 0800     ITP Comments Medical Director- Dr. Gaylyn Keas, MD. Introduction to the [facility]in Education / Intensive Cardiac Rehab Program. Reviewed initial orientation folder with Tommy. 30 Day ITP Review. Tommy started cardiac rehab on 12/23/23. Tommy did well with exercise 30 Day ITP Review. [name] is off to a good start to exercise at  cardiac rehab 30 Day ITP Review. [name] has good participation with exercise at  cardiac rehab. [name] has been absent recently due his family members being ill.       Comments: See ITP Comments

## 2024-02-05 ENCOUNTER — Encounter (HOSPITAL_COMMUNITY): Admission: RE | Admit: 2024-02-05 | Source: Ambulatory Visit

## 2024-02-08 ENCOUNTER — Encounter (HOSPITAL_COMMUNITY)
Admission: RE | Admit: 2024-02-08 | Discharge: 2024-02-08 | Disposition: A | Discharge status: Home / Self Care | Source: Ambulatory Visit | Attending: Cardiology | Admitting: Cardiology

## 2024-02-08 DIAGNOSIS — Z941 Heart transplant status: Secondary | ICD-10-CM

## 2024-02-10 ENCOUNTER — Encounter (HOSPITAL_COMMUNITY)
Admission: RE | Admit: 2024-02-10 | Discharge: 2024-02-10 | Disposition: A | Discharge status: Home / Self Care | Source: Ambulatory Visit | Attending: Cardiology

## 2024-02-10 DIAGNOSIS — Z941 Heart transplant status: Secondary | ICD-10-CM

## 2024-02-12 ENCOUNTER — Encounter (HOSPITAL_COMMUNITY)
Admission: RE | Admit: 2024-02-12 | Discharge: 2024-02-12 | Disposition: A | Discharge status: Home / Self Care | Source: Ambulatory Visit | Attending: Cardiology | Admitting: Cardiology

## 2024-02-12 DIAGNOSIS — Z941 Heart transplant status: Secondary | ICD-10-CM | POA: Diagnosis not present

## 2024-02-15 ENCOUNTER — Encounter (HOSPITAL_COMMUNITY)
Admission: RE | Admit: 2024-02-15 | Discharge: 2024-02-15 | Disposition: A | Discharge status: Home / Self Care | Source: Ambulatory Visit | Attending: Cardiology | Admitting: Cardiology

## 2024-02-15 DIAGNOSIS — Z941 Heart transplant status: Secondary | ICD-10-CM | POA: Diagnosis not present

## 2024-02-17 ENCOUNTER — Encounter (HOSPITAL_COMMUNITY)
Admission: RE | Admit: 2024-02-17 | Discharge: 2024-02-17 | Disposition: A | Discharge status: Home / Self Care | Source: Ambulatory Visit | Attending: Cardiology | Admitting: Cardiology

## 2024-02-17 DIAGNOSIS — Z941 Heart transplant status: Secondary | ICD-10-CM | POA: Diagnosis present

## 2024-02-17 DIAGNOSIS — Z48812 Encounter for surgical aftercare following surgery on the circulatory system: Secondary | ICD-10-CM | POA: Insufficient documentation

## 2024-02-22 ENCOUNTER — Encounter (HOSPITAL_COMMUNITY)
Admission: RE | Admit: 2024-02-22 | Discharge: 2024-02-22 | Disposition: A | Discharge status: Home / Self Care | Source: Ambulatory Visit | Attending: Cardiology

## 2024-02-22 DIAGNOSIS — Z941 Heart transplant status: Secondary | ICD-10-CM

## 2024-02-24 ENCOUNTER — Encounter (HOSPITAL_COMMUNITY)
Admission: RE | Admit: 2024-02-24 | Discharge: 2024-02-24 | Disposition: A | Discharge status: Home / Self Care | Source: Ambulatory Visit | Attending: Cardiology | Admitting: Cardiology

## 2024-02-24 DIAGNOSIS — Z941 Heart transplant status: Secondary | ICD-10-CM | POA: Diagnosis not present

## 2024-02-26 ENCOUNTER — Encounter (HOSPITAL_COMMUNITY): Admission: RE | Admit: 2024-02-26 | Source: Ambulatory Visit

## 2024-02-29 ENCOUNTER — Encounter (HOSPITAL_COMMUNITY)
Admission: RE | Admit: 2024-02-29 | Discharge: 2024-02-29 | Disposition: A | Discharge status: Home / Self Care | Source: Ambulatory Visit | Attending: Cardiology | Admitting: Cardiology

## 2024-02-29 DIAGNOSIS — Z941 Heart transplant status: Secondary | ICD-10-CM

## 2024-02-29 NOTE — Progress Notes (Signed)
 Cardiac Individual Treatment Plan  Patient Details  Name: [name] MRN: [id number] Date of Birth: 10/06/1975 Referring Provider:   Flowsheet Row INTENSIVE CARDIAC REHAB ORIENT from 12/15/2023 in [facility] for Heart, Vascular, & Lung Health  Referring Provider Tobie Danforth, MD [facility])  [name] Wilbert SAUNDERS, MD (coverage)]    Initial Encounter Date:  Flowsheet Row INTENSIVE CARDIAC REHAB ORIENT from 12/15/2023 in [facility] for Heart, Vascular, & Lung Health  Date 12/15/23    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 2 tablets twice a day, Disp: , Rfl:    ASPIRIN LOW DOSE 81 MG tablet, Take 81 mg by mouth daily. (Patient not taking: Reported on 12/23/2023), Disp: , Rfl:    Calcium  Citrate-Vitamin D  (CITRACAL + D PO), Take 1 tablet by mouth 2 (two) times daily., Disp: , Rfl:    Cholecalciferol  1.25 MG (50000 UT) capsule, Take 1 capsule by mouth once a week. (Patient not taking: Reported on 12/23/2023), Disp: , Rfl:    fexofenadine (ALLEGRA) 180 MG tablet, Take 180 mg by mouth daily., Disp: , Rfl:    Fingerstix Lancets MISC, 1 each by Other route 2 (two) times daily., Disp: , Rfl:    mirtazapine (REMERON) 7.5 MG tablet, Take 7.5 mg by mouth at bedtime., Disp: , Rfl:    montelukast  (SINGULAIR ) 10 MG tablet, TAKE 1 TABLET BY MOUTH EVERYDAY AT BEDTIME, Disp: 90 tablet, Rfl: 2   Multiple Vitamin (MULITIVITAMIN WITH MINERALS) TABS, Take 1 tablet by mouth daily., Disp: , Rfl:    mycophenolate (CELLCEPT) 250 MG capsule, Take 250 mg by mouth 2 (two) times daily. 6 capsules twice a day, Disp: , Rfl:    pantoprazole (PROTONIX) 40 MG tablet, Take 40 mg by mouth daily., Disp: , Rfl:    polyethylene glycol (MIRALAX  / GLYCOLAX ) 17 g packet, Take 17 g by mouth daily as needed for moderate  constipation., Disp: , Rfl:    potassium chloride  SA (KLOR-CON  M) 20 MEQ tablet, Take 20 mEq by mouth once., Disp: , Rfl:    PRECISION QID TEST test strip, 1 strip by Other route in the morning and at bedtime., Disp: , Rfl:    predniSONE  (DELTASONE ) 5 MG tablet, Take 15 mg by mouth daily with breakfast., Disp: , Rfl:    propranolol (INDERAL) 10 MG tablet, Take 10 mg by mouth., Disp: , Rfl:    rosuvastatin (CRESTOR) 10 MG tablet, Take 10 mg by mouth daily., Disp: , Rfl:    senna-docusate (SENOKOT-S) 8.6-50 MG tablet, Take 1 tablet by mouth 2 (two) times daily., Disp: , Rfl:    sertraline  (ZOLOFT ) 50 MG tablet, Take 75 mg by mouth daily., Disp: , Rfl:    sulfamethoxazole-trimethoprim (BACTRIM) 400-80 MG tablet, Take 1 tablet by mouth daily., Disp: , Rfl:    tacrolimus (PROGRAF) 1 MG capsule, Take 1 mg by mouth 2 (two) times daily. 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   Asthma when younger   Fatty liver    History of chicken pox    History of kidney stones    MDT DVFB1D1 Visia AF MRI VR  ICD (implantable cardiac defibrillator) in place    Osteopenia    Pancreatitis    S/P orthotopic heart transplant (HCC)    At [facility]   Sleep apnea    uses cpap   Ventricular tachycardia (HCC)    sotalol  therapy;  catheter ablation at [facility] 10/10 and [facility] 2013    Tobacco Use: Social History   Tobacco Use  Smoking Status Former   Current packs/day: 0.00   Average  packs/day: 1 pack/day for 18.0 years (18.0 ttl pk-yrs)   Types: Cigarettes   Start date: 01/17/1990   Quit date: 08/19/2007   Years since quitting: 16.5  Smokeless Tobacco Never    Labs: Review Flowsheet  More data exists      Latest Ref Rng & Units 09/05/2019 09/11/2020 09/17/2021 07/15/2023 10/21/2023  Labs for ITP Cardiac and Pulmonary Rehab  Cholestrol 0 - 200 mg/dL 803  828  803  - -  Direct LDL mg/dL 869.9  893.9  867.9  - -  HDL-C >39.00 mg/dL 63.69  68.09  63.59  - -  Trlycerides 0.0 - 149.0 mg/dL 763.9  742.9  711.9  - -  Hemoglobin A1c 4.6 - 6.5 % 5.6  5.5  5.8  - -  TCO2 22 - 32 mmol/L - - - 28  27     Capillary Blood Glucose: No results found for: GLUCAP   Exercise Target Goals: Exercise Program Goal: Individual exercise prescription set using results from initial 6 min walk test and THRR while considering  patient's activity barriers and safety.   Exercise Prescription Goal: Initial exercise prescription builds to 30-45 minutes a day of aerobic activity, 2-3 days per week.  Home exercise guidelines will be given to patient during program as part of exercise prescription that the participant will acknowledge.  Activity Barriers & Risk Stratification:  Activity Barriers & Cardiac Risk Stratification - 12/15/23 1005       Activity Barriers & Cardiac Risk Stratification   Activity Barriers Other (comment)    Comments Post-surgical pain: left rib cage across abdomen; hx kyphoplasty & laminectomy: chronic, mild, low back pain; osteoporosis.    Cardiac Risk Stratification High          6 Minute Walk:  6 Minute Walk     Row Name 12/15/23 1055         6 Minute Walk   Phase Initial     Distance 1321 feet     Walk Time 6 minutes     # of Rest Breaks 0     MPH 2.5     METS 4.37     RPE 10     Perceived Dyspnea  1     VO2 Peak 15.29     Symptoms Yes (comment)     Comments Mild shortness of breath     Resting HR 86 bpm     Resting BP 124/78     Resting  Oxygen Saturation  98 %     Exercise Oxygen Saturation  during 6 min walk 98 %  Max Ex. HR 91 bpm     Max Ex. BP 140/82     2 Minute Post BP 136/80        Oxygen Initial Assessment:   Oxygen Re-Evaluation:   Oxygen Discharge (Final Oxygen Re-Evaluation):   Initial Exercise Prescription:  Initial Exercise Prescription - 12/15/23 1100       Date of Initial Exercise RX and Referring Provider   Date 12/15/23    Referring Provider Tobie Danforth, MD [facility])   [name] Wilbert SAUNDERS, MD (coverage)   Expected Discharge Date 03/09/24      Treadmill   MPH 2.5    Grade 0    Minutes 15    METs 2.91      NuStep   Level 2    SPM 85    Minutes 15    METs 2.9      Prescription Details   Frequency (times per week) 3    Duration Progress to 30 minutes of continuous aerobic without signs/symptoms of physical distress      Intensity   THRR 40-80% of Max Heartrate 70-140    Ratings of Perceived Exertion 11-13    Perceived Dyspnea 0-4      Progression   Progression Continue to progress workloads to maintain intensity without signs/symptoms of physical distress.      Resistance Training   Training Prescription Yes    Weight 3 lbs    Reps 10-15          Perform Capillary Blood Glucose checks as needed.  Exercise Prescription Changes:   Exercise Prescription Changes     Row Name 12/23/23 1029 12/28/23 1029 01/25/24 1038 02/15/24 1033 02/29/24 1032     Response to Exercise   Blood Pressure (Admit) 110/82 118/78 120/66 122/68 110/60   Blood Pressure (Exercise) 122/60 124/72 128/62 -- --   Blood Pressure (Exit) 102/60 122/78 110/60 112/70 102/66   Heart Rate (Admit) 81 bpm 86 bpm 83 bpm 86 bpm 73 bpm   Heart Rate (Exercise) 95 bpm 100 bpm 108 bpm 116 bpm 125 bpm   Heart Rate (Exit) 84 bpm 95 bpm 99 bpm 95 bpm 84 bpm   Rating of Perceived Exertion (Exercise) 11.5 9 11 11 11    Symptoms None None None None None   Comments Off to a good start with exercise. -- Reviewed home  exercise guidelines and goals with [name]. Increased weights. -- --   Duration Continue with 30 min of aerobic exercise without signs/symptoms of physical distress. Continue with 30 min of aerobic exercise without signs/symptoms of physical distress. Continue with 30 min of aerobic exercise without signs/symptoms of physical distress. Continue with 30 min of aerobic exercise without signs/symptoms of physical distress. Continue with 30 min of aerobic exercise without signs/symptoms of physical distress.   Intensity THRR unchanged THRR unchanged THRR unchanged THRR unchanged THRR unchanged     Progression   Progression Continue to progress workloads to maintain intensity without signs/symptoms of physical distress. Continue to progress workloads to maintain intensity without signs/symptoms of physical distress. Continue to progress workloads to maintain intensity without signs/symptoms of physical distress. Continue to progress workloads to maintain intensity without signs/symptoms of physical distress. Continue to progress workloads to maintain intensity without signs/symptoms of physical distress.   Average METs 2.5 2.5 3.3 3.9 4     Resistance Training   Training Prescription No Yes Yes Yes Yes   Weight Relaxation day, no weights. 3 lbs 4 lbs 4 lbs 4 lbs   Reps --  10-15 10-15 10-15 10-15   Time -- 10 Minutes 10 Minutes 10 Minutes 10 Minutes     Interval Training   Interval Training No No No No No     Treadmill   MPH 2.5 2.5 2.5 3 3    Grade 0 0 1 0 0   Minutes 15 15 15 15 15    METs 2.91 2.91 3.26 3.3 3.3     NuStep   Level 2 2 4 5 6    SPM 80 102 116 109 93   Minutes 15 15 15 15 15    METs 2.1 2.8 3.4 4.5 4.7     Home Exercise Plan   Plans to continue exercise at -- -- Home (comment)  Walking Home (comment)  Walking Home (comment)  Walking   Frequency -- -- Add 3 additional days to program exercise sessions. Add 3 additional days to program exercise sessions. Add 3 additional days to  program exercise sessions.   Initial Home Exercises Provided -- -- 01/25/24 01/25/24 01/25/24      Exercise Comments:   Exercise Comments     Row Name 12/23/23 1124 01/04/24 1048 01/25/24 1117 02/22/24 1107     Exercise Comments [name] tolerated low intensity exercise well without symptoms. Reviewed goals with [name]. Reviewed home exercise guidelines and goals with [name]. Reviewed METs and goals with [name].       Exercise Goals and Review:   Exercise Goals     Row Name 12/15/23 0926             Exercise Goals   Increase Physical Activity Yes       Intervention Provide advice, education, support and counseling about physical activity/exercise needs.;Develop an individualized exercise prescription for aerobic and resistive training based on initial evaluation findings, risk stratification, comorbidities and participant's personal goals.       Expected Outcomes Short Term: Attend rehab on a regular basis to increase amount of physical activity.;Long Term: Exercising regularly at least 3-5 days a week.;Long Term: Add in home exercise to make exercise part of routine and to increase amount of physical activity.       Increase Strength and Stamina Yes       Intervention Provide advice, education, support and counseling about physical activity/exercise needs.;Develop an individualized exercise prescription for aerobic and resistive training based on initial evaluation findings, risk stratification, comorbidities and participant's personal goals.       Expected Outcomes Short Term: Increase workloads from initial exercise prescription for resistance, speed, and METs.;Short Term: Perform resistance training exercises routinely during rehab and add in resistance training at home;Long Term: Improve cardiorespiratory fitness, muscular endurance and strength as measured by increased METs and functional capacity ( )       Able to understand and use rate of perceived exertion (RPE) scale Yes        Intervention Provide education and explanation on how to use RPE scale       Expected Outcomes Short Term: Able to use RPE daily in rehab to express subjective intensity level;Long Term:  Able to use RPE to guide intensity level when exercising independently       Knowledge and understanding of Target Heart Rate Range (THRR) Yes       Intervention Provide education and explanation of THRR including how the numbers were predicted and where they are located for reference       Expected Outcomes Short Term: Able to state/look up THRR;Long Term: Able to use THRR to govern intensity when exercising  independently;Short Term: Able to use daily as guideline for intensity in rehab       Able to check pulse independently Yes       Intervention Provide education and demonstration on how to check pulse in carotid and radial arteries.;Review the importance of being able to check your own pulse for safety during independent exercise       Expected Outcomes Short Term: Able to explain why pulse checking is important during independent exercise;Long Term: Able to check pulse independently and accurately       Understanding of Exercise Prescription Yes       Intervention Provide education, explanation, and written materials on patient's individual exercise prescription       Expected Outcomes Short Term: Able to explain program exercise prescription;Long Term: Able to explain home exercise prescription to exercise independently          Exercise Goals Re-Evaluation :  Exercise Goals Re-Evaluation     Row Name 12/23/23 1124 01/04/24 1048 01/25/24 1117 02/22/24 1107       Exercise Goal Re-Evaluation   Exercise Goals Review Increase Physical Activity;Increase Strength and Stamina;Able to understand and use rate of perceived exertion (RPE) scale Increase Physical Activity;Increase Strength and Stamina;Able to understand and use rate of perceived exertion (RPE) scale Increase Physical Activity;Increase Strength  and Stamina;Able to understand and use rate of perceived exertion (RPE) scale;Knowledge and understanding of Target Heart Rate Range (THRR);Able to check pulse independently;Understanding of Exercise Prescription Increase Physical Activity;Increase Strength and Stamina;Able to understand and use rate of perceived exertion (RPE) scale;Knowledge and understanding of Target Heart Rate Range (THRR);Able to check pulse independently;Understanding of Exercise Prescription    Comments [name] was able to understand and use RPE scale appropriately. [name] is doing some walking at home in addition to exercise at cardiac rehab. his goal is to increase stamina and strength. Reviewed exercise prescription with [name]. Encouraged to accumulate 150 minutes of aerobic exercise/ week. He has a heart rate monitor to check his pulse. [name] continues to make steady progress with exercise. He would like to extend through 03/21/24 to make up missed sessions.    Expected Outcomes Progress workloads as tolerated to help improve cardiorespiratory ftness. Continue to progress workloads as tolerated to help increase stamina and strength. [name] will walk 30 minutes at least 2 days/week at home in addition to exercise at cardiac rehab. Continue to progress workloads as tolerated.       Discharge Exercise Prescription (Final Exercise Prescription Changes):  Exercise Prescription Changes - 02/29/24 1032       Response to Exercise   Blood Pressure (Admit) 110/60    Blood Pressure (Exit) 102/66    Heart Rate (Admit) 73 bpm    Heart Rate (Exercise) 125 bpm    Heart Rate (Exit) 84 bpm    Rating of Perceived Exertion (Exercise) 11    Symptoms None    Duration Continue with 30 min of aerobic exercise without signs/symptoms of physical distress.    Intensity THRR unchanged      Progression   Progression Continue to progress workloads to maintain intensity without signs/symptoms of physical distress.    Average METs 4      Resistance  Training   Training Prescription Yes    Weight 4 lbs    Reps 10-15    Time 10 Minutes      Interval Training   Interval Training No      Treadmill   MPH 3    Grade 0  Minutes 15    METs 3.3      NuStep   Level 6    SPM 93    Minutes 15    METs 4.7      Home Exercise Plan   Plans to continue exercise at Home (comment)   Walking   Frequency Add 3 additional days to program exercise sessions.    Initial Home Exercises Provided 01/25/24          Nutrition:  Target Goals: Understanding of nutrition guidelines, daily intake of sodium 1500mg , cholesterol 200mg , calories 30% from fat and 7% or less from saturated fats, daily to have 5 or more servings of fruits and vegetables.  Biometrics:  Pre Biometrics - 12/15/23 0917       Pre Biometrics   Waist Circumference 36.5 inches    Hip Circumference 40.25 inches    Waist to Hip Ratio 0.91 %    Triceps Skinfold 14 mm    % Body Fat 24.3 %    Grip Strength 42 kg    Flexibility --   Not performed due to post-surgical pain.   Single Leg Stand 30 seconds           Nutrition Therapy Plan and Nutrition Goals:  Nutrition Therapy & Goals - 02/24/24 1630       Nutrition Therapy   Diet Heart Healthy Diet    Drug/Food Interactions Statins/Certain Fruits      Personal Nutrition Goals   Nutrition Goal Patient to identify strategies for reducing cardiovascular risk by attending the [employer] education and nutrition series weekly.   goals in progress.   Personal Goal #2 Patient to improve diet quality by using the plate method as a guide for meal planning to include lean protein/plant protein, fruits, vegetables, whole grains, nonfat dairy as part of a well-balanced diet.   goals in progress.   Comments Goals in progress. 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 He continues to attend the [facility] education/nutrition series regularly.  He  continues prednisone  at this time. He is motivated to maintain weight between 170-180#; he is up 6.4# since starting with our program. Lipids are well controlled. Patient will benefit from participation in intensive cardiac rehab for nutrition, exercise, and lifestyle modification.      Intervention Plan   Intervention Prescribe, educate and counsel regarding individualized specific dietary modifications aiming towards targeted core components such as weight, hypertension, lipid management, diabetes, heart failure and other comorbidities.;Nutrition handout(s) given to patient.    Expected Outcomes Short Term Goal: Understand basic principles of dietary content, such as calories, fat, sodium, cholesterol and nutrients.;Long Term Goal: Adherence to prescribed nutrition plan.          Nutrition Assessments:  Nutrition Assessments - 12/31/23 1159       Rate Your Plate Scores   Pre Score 48         MEDIFICTS Score Key: >=70 Need to make dietary changes  40-70 Heart Healthy Diet <= 40 Therapeutic Level Cholesterol Diet   Flowsheet Row INTENSIVE CARDIAC REHAB from 12/30/2023 in [facility] for Heart, Vascular, & Lung Health  Picture Your Plate Total Score on Admission 48   Picture Your Plate Scores: <59 Unhealthy dietary pattern with much room for improvement. 41-50 Dietary pattern unlikely to meet recommendations for good health and room for improvement. 51-60 More healthful dietary pattern, with some room for improvement.  >60 Healthy dietary pattern, although  there may be some specific behaviors that could be improved.    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 He continues prednisone  at this time. He is motivated to maintain weight between 170-180#. Patient will benefit from participation in intensive cardiac rehab for nutrition, exercise, and lifestyle modification. Goals in progress. 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 He continues to attend the [employer] education/nutrition series regularly. He continues prednisone  at this time. He is motivated to maintain weight between 170-180#; he is up ~2# since starting with our program. Lipids are well controlled. Patient will benefit from participation in intensive cardiac rehab for nutrition, exercise, and lifestyle modification. Goals in progress. 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 He continues to attend the [facility] education/nutrition series regularly. He continues prednisone  at this time. He is motivated to maintain weight between 170-180#; he is up 6.4# since starting with our program. Lipids are well controlled. He continues regular follow-up with [facility] transplant team. Patient will benefit from participation in intensive cardiac rehab for nutrition, exercise, and lifestyle modification.        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 He continues  prednisone  at this time. He is motivated to maintain weight between 170-180#. Patient will benefit from participation in intensive cardiac rehab for nutrition, exercise, and lifestyle modification. Goals in progress. 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 He continues to attend the [employer] education/nutrition series regularly. He continues prednisone  at this time. He is motivated to maintain weight between 170-180#; he is up ~2# since starting with our program. Lipids are well controlled. Patient will benefit from participation in intensive cardiac rehab for nutrition, exercise, and lifestyle modification. Goals in progress. 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 He continues to attend the [facility] education/nutrition series regularly. He continues prednisone  at this time. He is motivated to maintain weight between 170-180#; he is up 6.4# since starting with our program. Lipids are well controlled. He continues regular follow-up with [facility] transplant team. Patient will benefit from participation in intensive cardiac rehab for nutrition, exercise, and lifestyle modification.        Nutrition Goals Discharge (Final Nutrition Goals Re-Evaluation):  Nutrition Goals Re-Evaluation - 02/24/24 1630  Goals   Current Weight 189 lb 9.5 oz (86 kg)    Comment HDL 47, LDL 71, triglycerides 156    Expected Outcome Goals in progress. 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 He continues to attend the [facility] education/nutrition series regularly. He continues prednisone  at this time. He is motivated to maintain weight between 170-180#; he is up 6.4# since starting with our program. Lipids are well controlled. He continues regular follow-up with [facility]  transplant team. Patient will benefit from participation in intensive cardiac rehab for nutrition, exercise, and lifestyle modification.          Psychosocial: Target Goals: Acknowledge presence or absence of significant depression and/or stress, maximize coping skills, provide positive support system. Participant is able to verbalize types and ability to use techniques and skills needed for reducing stress and depression.  Initial Review & Psychosocial Screening:  Initial Psych Review & Screening - 12/15/23 1022       Initial Review   Current issues with History of Depression;Current Anxiety/Panic;Current Psychotropic Meds;Current Depression;Current Stress Concerns    Source of Stress Concerns Family;Chronic Illness      Family Dynamics   Good Support System? Yes   [name] has his wife [name] and Aunt for support   Comments [name] has PTSD and is receiving counseling every 2-3 weeks virtually via [facility]. [name] admits to being currently depressed and experiences anxiety. [name] says that the antidpressant and antianxiety medications he is currently taking is working.      Barriers   Psychosocial barriers to participate in program The patient should benefit from training in stress management and relaxation.      Screening Interventions   Interventions Encouraged to exercise;To provide support and resources with identified psychosocial needs;Provide feedback about the scores to participant    Expected Outcomes Long Term Goal: Stressors or current issues are controlled or eliminated.;Short Term goal: Identification and review with participant of any Quality of Life or Depression concerns found by scoring the questionnaire.;Long Term goal: The participant improves quality of Life and PHQ9 Scores as seen by post scores and/or verbalization of changes          Quality of Life Scores:  Quality of Life - 12/15/23 1151       Quality of Life   Select Quality of Life      Quality of Life  Scores   Health/Function Pre 11.3 %    Socioeconomic Pre 21.07 %    Psych/Spiritual Pre 15.07 %    Family Pre 27.6 %    GLOBAL Pre 16.49 %         Scores of 19 and below usually indicate a poorer quality of life in these areas.  A difference of  2-3 points is a clinically meaningful difference.  A difference of 2-3 points in the total score of the Quality of Life Index has been associated with significant improvement in overall quality of life, self-image, physical symptoms, and general health in studies assessing change in quality of life.  PHQ-9: Review Flowsheet  More data exists      12/15/2023 09/30/2023 08/31/2023 07/21/2023 04/15/2023  Depression screen PHQ 2/9  Decreased Interest 0 2 1 3 2   Down, Depressed, Hopeless 1 2 1 3 2   PHQ - 2 Score 1 4 2 6 4   Altered sleeping 1 2 1 3 1   Tired, decreased energy 1 3 2 3 3   Change in appetite 0 1  0 2 1  Feeling bad or failure about yourself  1 2 1 3  0  Trouble concentrating 1 1 1 3 3   Moving slowly or fidgety/restless 0 1 1 2 1   Suicidal thoughts 0 0 0 0 0  PHQ-9 Score 5 14 8 22 13   Difficult doing work/chores Extremely dIfficult Extremely dIfficult Extremely dIfficult Extremely dIfficult Very difficult   Interpretation of Total Score  Total Score Depression Severity:  1-4 = Minimal depression, 5-9 = Mild depression, 10-14 = Moderate depression, 15-19 = Moderately severe depression, 20-27 = Severe depression   Psychosocial Evaluation and Intervention:   Psychosocial Re-Evaluation:  Psychosocial Re-Evaluation     Row Name 12/24/23 0902 01/05/24 1021 02/01/24 0801 02/23/24 1501       Psychosocial Re-Evaluation   Current issues with History of Depression;Current Depression;Current Stress Concerns;Current Anxiety/Panic;Current Psychotropic Meds History of Depression;Current Depression;Current Stress Concerns;Current Anxiety/Panic;Current Psychotropic Meds History of Depression;Current Depression;Current Stress Concerns;Current  Anxiety/Panic;Current Psychotropic Meds History of Depression;Current Depression;Current Stress Concerns;Current Anxiety/Panic;Current Psychotropic Meds    Comments [name] was 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 Will review quality of life questionnaire in the upcoming week Quality of life and PHQ9 reviewed.[name] admits to being dissatisfied with his health due to his recent heart transplant surgery and is experiencing some depression. [name] says his still gets fatigued easily which is a challenge since he has a family with small children. [name] has good support from his wife. [name] is currently receiving  virtual counseling from  [facility]. [name] al;so sees a psychiatrist. [name] had a biopsy last week. [name] says his rejection risk has been downgraded which is reassuring for [name]. [name] has not voiced any increased concerns or stressors during exercise at cardiac rehab. [name] has not voiced any increased concerns or stressors during exercise at cardiac rehab. [name] says thhat his prednisone  dose have been decreased to 10 mg daily by  the [facility] heart transplant team.    Expected Outcomes [name] will have controlled or decreased depression / anxiet and stressors upon return to exercise at cardiac rehab [name] will have controlled or decreased depression / anxiet and stressors upon return to exercise at cardiac rehab [name] will have controlled or decreased depression / anxiet and stressors upon return to exercise at cardiac rehab [name] will have controlled or decreased depression / anxiet and stressors upon return to exercise at cardiac rehab    Interventions Stress management education;Encouraged to attend Cardiac Rehabilitation for the exercise;Relaxation education Stress management education;Encouraged to attend Cardiac Rehabilitation for the exercise;Relaxation education Stress management education;Encouraged to attend Cardiac  Rehabilitation for the exercise;Relaxation education Stress management education;Encouraged to attend Cardiac Rehabilitation for the exercise;Relaxation education    Continue Psychosocial Services  Follow up required by staff Follow up required by staff No Follow up required No Follow up required      Initial Review   Source of Stress Concerns Chronic Illness;Unable to participate in former interests or hobbies;Unable to perform yard/household activities Chronic Illness;Unable to participate in former interests or hobbies;Unable to perform yard/household activities Chronic Illness;Unable to participate in former interests or hobbies;Unable to perform yard/household activities Chronic Illness;Unable to participate in former interests or hobbies;Unable to perform yard/household activities    Comments Will continue to monitor and offer support as needed Will continue to monitor and offer support as needed Will continue to monitor and offer support as needed Will continue to monitor and offer support as needed  Psychosocial Discharge (Final Psychosocial Re-Evaluation):  Psychosocial Re-Evaluation - 02/23/24 1501       Psychosocial Re-Evaluation   Current issues with History of Depression;Current Depression;Current Stress Concerns;Current Anxiety/Panic;Current Psychotropic Meds    Comments [name] has not voiced any increased concerns or stressors during exercise at cardiac rehab. [name] says thhat his prednisone  dose have been decreased to 10 mg daily by  the [facility] heart transplant team.    Expected Outcomes [name] will have controlled or decreased depression / anxiet and stressors upon return to exercise at cardiac rehab    Interventions Stress management education;Encouraged to attend Cardiac Rehabilitation for the exercise;Relaxation education    Continue Psychosocial Services  No Follow up required      Initial Review   Source of Stress Concerns Chronic Illness;Unable to participate in former  interests or hobbies;Unable to perform yard/household activities    Comments Will continue to monitor and offer support as needed          Vocational Rehabilitation: Provide vocational rehab assistance to qualifying candidates.   Vocational Rehab Evaluation & Intervention:  Vocational Rehab - 12/15/23 1030       Initial Vocational Rehab Evaluation & Intervention   Assessment shows need for Vocational Rehabilitation No   [name] is currently on LOA and does not need vocational rehab at this time         Education: Education Goals: Education classes will be provided on a weekly basis, covering required topics. Participant will state understanding/return demonstration of topics presented.    Education     Row Name 12/23/23 1400     Education   Cardiac Education Topics Pritikin   Orthoptist   Educator Dietitian   Weekly Topic Adding Flavor - Sodium-Free   Instruction Review Code 1- Verbalizes Understanding   Class Start Time 1145   Class Stop Time 1221   Class Time Calculation (min) 36 min    Row Name 12/28/23 1300     Education   Cardiac Education Topics Pritikin   Glass blower/designer Nutrition   Nutrition Workshop Label Reading   Instruction Review Code 1- Verbalizes Understanding   Class Start Time 1145   Class Stop Time 1228   Class Time Calculation (min) 43 min    Row Name 01/04/24 1200     Education   Cardiac Education Topics Pritikin   Hospital doctor Education   General Education Metabolic Syndrome and Belly Fat   Instruction Review Code 1- Verbalizes Understanding   Class Start Time 1150   Class Stop Time 1230   Class Time Calculation (min) 40 min    Row Name 01/06/24 1400     Education   Cardiac Education Topics Pritikin   Orthoptist   Educator Dietitian   Weekly Topic  Personalizing Your Pritikin Plate   Instruction Review Code 1- Verbalizes Understanding   Class Start Time 1145   Class Stop Time 1220   Class Time Calculation (min) 35 min    Row Name 01/13/24 1500     Education   Cardiac Education Topics Pritikin   Customer service manager   Weekly Topic [employer] Desserts   Instruction Review Code 1- Verbalizes Understanding   Class Start Time  1145   Class Stop Time 1225   Class Time Calculation (min) 40 min    Row Name 01/22/24 1000     Education   Cardiac Education Topics Pritikin   Psychologist, forensic Exercise Education   Exercise Education Move It!   Instruction Review Code 1- Verbalizes Understanding   Class Start Time 1017   Class Stop Time 1050   Class Time Calculation (min) 33 min    Row Name 02/12/24 1100     Education   Cardiac Education Topics Pritikin   Select Core Videos     Core Videos   Educator Dietitian   Select Nutrition   Nutrition Vitamins and Minerals   Instruction Review Code 1- Verbalizes Understanding   Class Start Time 1145   Class Stop Time 1225   Class Time Calculation (min) 40 min    Row Name 02/15/24 1600     Education   Cardiac Education Topics Pritikin   Glass blower/designer Nutrition   Nutrition Workshop Fueling a Forensic psychologist   Instruction Review Code 1- Tax inspector   Class Start Time 1145   Class Stop Time 1230   Class Time Calculation (min) 45 min    Row Name 02/24/24 1100     Education   Cardiac Education Topics Pritikin   Customer service manager   Weekly Topic Simple Sides and Sauces   Instruction Review Code 1- Verbalizes Understanding   Class Start Time 1145   Class Stop Time 1225   Class Time Calculation (min) 40 min      Core Videos: Exercise    Move It!  Clinical staff  conducted group or individual video education with verbal and written material and guidebook.  Patient learns the recommended Pritikin exercise program. Exercise with the goal of living a long, healthy life. Some of the health benefits of exercise include controlled diabetes, healthier blood pressure levels, improved cholesterol levels, improved heart and lung capacity, improved sleep, and better body composition. Everyone should speak with their doctor before starting or changing an exercise routine.  Biomechanical Limitations Clinical staff conducted group or individual video education with verbal and written material and guidebook.  Patient learns how biomechanical limitations can impact exercise and how we can mitigate and possibly overcome limitations to have an impactful and balanced exercise routine.  Body Composition Clinical staff conducted group or individual video education with verbal and written material and guidebook.  Patient learns that body composition (ratio of muscle mass to fat mass) is a key component to assessing overall fitness, rather than body weight alone. Increased fat mass, especially visceral belly fat, can put us  at increased risk for metabolic syndrome, type 2 diabetes, heart disease, and even death. It is recommended to combine diet and exercise (cardiovascular and resistance training) to improve your body composition. Seek guidance from your physician and exercise physiologist before implementing an exercise routine.  Exercise Action Plan Clinical staff conducted group or individual video education with verbal and written material and guidebook.  Patient learns the recommended strategies to achieve and enjoy long-term exercise adherence, including variety, self-motivation, self-efficacy, and positive decision making. Benefits of exercise include fitness, good health, weight management, more energy, better sleep, less stress, and overall well-being.  Medical   Heart  Disease Risk Reduction Clinical staff conducted  group or individual video education with verbal and written material and guidebook.  Patient learns our heart is our most vital organ as it circulates oxygen, nutrients, white blood cells, and hormones throughout the entire body, and carries waste away. Data supports a plant-based eating plan like the Pritikin Program for its effectiveness in slowing progression of and reversing heart disease. The video provides a number of recommendations to address heart disease.   Metabolic Syndrome and Belly Fat  Clinical staff conducted group or individual video education with verbal and written material and guidebook.  Patient learns what metabolic syndrome is, how it leads to heart disease, and how one can reverse it and keep it from coming back. You have metabolic syndrome if you have 3 of the following 5 criteria: abdominal obesity, high blood pressure, high triglycerides, low HDL cholesterol, and high blood sugar.  Hypertension and Heart Disease Clinical staff conducted group or individual video education with verbal and written material and guidebook.  Patient learns that high blood pressure, or hypertension, is very common in the [place] . Hypertension is largely due to excessive salt intake, but other important risk factors include being overweight, physical inactivity, drinking too much alcohol, smoking, and not eating enough potassium from fruits and vegetables. High blood pressure is a leading risk factor for heart attack, stroke, congestive heart failure, dementia, kidney failure, and premature death. Long-term effects of excessive salt intake include stiffening of the arteries and thickening of heart muscle and organ damage. Recommendations include ways to reduce hypertension and the risk of heart disease.  Diseases of Our Time - Focusing on Diabetes Clinical staff conducted group or individual video education with verbal and written material and  guidebook.  Patient learns why the best way to stop diseases of our time is prevention, through food and other lifestyle changes. Medicine (such as prescription pills and surgeries) is often only a Band-Aid on the problem, not a long-term solution. Most common diseases of our time include obesity, type 2 diabetes, hypertension, heart disease, and cancer. The Pritikin Program is recommended and has been proven to help reduce, reverse, and/or prevent the damaging effects of metabolic syndrome.  Nutrition   Overview of the Pritikin Eating Plan  Clinical staff conducted group or individual video education with verbal and written material and guidebook.  Patient learns about the Pritikin Eating Plan for disease risk reduction. The Pritikin Eating Plan emphasizes a wide variety of unrefined, minimally-processed carbohydrates, like fruits, vegetables, whole grains, and legumes. Go, Caution, and Stop food choices are explained. Plant-based and lean animal proteins are emphasized. Rationale provided for low sodium intake for blood pressure control, low added sugars for blood sugar stabilization, and low added fats and oils for coronary artery disease risk reduction and weight management.  Calorie Density  Clinical staff conducted group or individual video education with verbal and written material and guidebook.  Patient learns about calorie density and how it impacts the Pritikin Eating Plan. Knowing the characteristics of the food you choose will help you decide whether those foods will lead to weight gain or weight loss, and whether you want to consume more or less of them. Weight loss is usually a side effect of the Pritikin Eating Plan because of its focus on low calorie-dense foods.  Label Reading  Clinical staff conducted group or individual video education with verbal and written material and guidebook.  Patient learns about the Pritikin recommended label reading guidelines and corresponding  recommendations regarding calorie density, added sugars, sodium content,  and whole grains.  Dining Out - Part 1  Clinical staff conducted group or individual video education with verbal and written material and guidebook.  Patient learns that restaurant meals can be sabotaging because they can be so high in calories, fat, sodium, and/or sugar. Patient learns recommended strategies on how to positively address this and avoid unhealthy pitfalls.  Facts on Fats  Clinical staff conducted group or individual video education with verbal and written material and guidebook.  Patient learns that lifestyle modifications can be just as effective, if not more so, as many medications for lowering your risk of heart disease. A Pritikin lifestyle can help to reduce your risk of inflammation and atherosclerosis (cholesterol build-up, or plaque, in the artery walls). Lifestyle interventions such as dietary choices and physical activity address the cause of atherosclerosis. A review of the types of fats and their impact on blood cholesterol levels, along with dietary recommendations to reduce fat intake is also included.  Nutrition Action Plan  Clinical staff conducted group or individual video education with verbal and written material and guidebook.  Patient learns how to incorporate Pritikin recommendations into their lifestyle. Recommendations include planning and keeping personal health goals in mind as an important part of their success.  Healthy Mind-Set    Healthy Minds, Bodies, Hearts  Clinical staff conducted group or individual video education with verbal and written material and guidebook.  Patient learns how to identify when they are stressed. Video will discuss the impact of that stress, as well as the many benefits of stress management. Patient will also be introduced to stress management techniques. The way we think, act, and feel has an impact on our hearts.  How Our Thoughts Can Heal Our Hearts   Clinical staff conducted group or individual video education with verbal and written material and guidebook.  Patient learns that negative thoughts can cause depression and anxiety. This can result in negative lifestyle behavior and serious health problems. Cognitive behavioral therapy is an effective method to help control our thoughts in order to change and improve our emotional outlook.  Additional Videos:  Exercise    Improving Performance  Clinical staff conducted group or individual video education with verbal and written material and guidebook.  Patient learns to use a non-linear approach by alternating intensity levels and lengths of time spent exercising to help burn more calories and lose more body fat. Cardiovascular exercise helps improve heart health, metabolism, hormonal balance, blood sugar control, and recovery from fatigue. Resistance training improves strength, endurance, balance, coordination, reaction time, metabolism, and muscle mass. Flexibility exercise improves circulation, posture, and balance. Seek guidance from your physician and exercise physiologist before implementing an exercise routine and learn your capabilities and proper form for all exercise.  Introduction to Yoga  Clinical staff conducted group or individual video education with verbal and written material and guidebook.  Patient learns about yoga, a discipline of the coming together of mind, breath, and body. The benefits of yoga include improved flexibility, improved range of motion, better posture and core strength, increased lung function, weight loss, and positive self-image. Yoga's heart health benefits include lowered blood pressure, healthier heart rate, decreased cholesterol and triglyceride levels, improved immune function, and reduced stress. Seek guidance from your physician and exercise physiologist before implementing an exercise routine and learn your capabilities and proper form for all  exercise.  Medical   Aging: Enhancing Your Quality of Life  Clinical staff conducted group or individual video education with verbal and written material and  guidebook.  Patient learns key strategies and recommendations to stay in good physical health and enhance quality of life, such as prevention strategies, having an advocate, securing a Health Care Proxy and Power of Attorney, and keeping a list of medications and system for tracking them. It also discusses how to avoid risk for bone loss.  Biology of Weight Control  Clinical staff conducted group or individual video education with verbal and written material and guidebook.  Patient learns that weight gain occurs because we consume more calories than we burn (eating more, moving less). Even if your body weight is normal, you may have higher ratios of fat compared to muscle mass. Too much body fat puts you at increased risk for cardiovascular disease, heart attack, stroke, type 2 diabetes, and obesity-related cancers. In addition to exercise, following the Pritikin Eating Plan can help reduce your risk.  Decoding Lab Results  Clinical staff conducted group or individual video education with verbal and written material and guidebook.  Patient learns that lab test reflects one measurement whose values change over time and are influenced by many factors, including medication, stress, sleep, exercise, food, hydration, pre-existing medical conditions, and more. It is recommended to use the knowledge from this video to become more involved with your lab results and evaluate your numbers to speak with your doctor.   Diseases of Our Time - Overview  Clinical staff conducted group or individual video education with verbal and written material and guidebook.  Patient learns that according to the CDC, 50% to 70% of chronic diseases (such as obesity, type 2 diabetes, elevated lipids, hypertension, and heart disease) are avoidable through lifestyle  improvements including healthier food choices, listening to satiety cues, and increased physical activity.  Sleep Disorders Clinical staff conducted group or individual video education with verbal and written material and guidebook.  Patient learns how good quality and duration of sleep are important to overall health and well-being. Patient also learns about sleep disorders and how they impact health along with recommendations to address them, including discussing with a physician.  Nutrition  Dining Out - Part 2 Clinical staff conducted group or individual video education with verbal and written material and guidebook.  Patient learns how to plan ahead and communicate in order to maximize their dining experience in a healthy and nutritious manner. Included are recommended food choices based on the type of restaurant the patient is visiting.   Fueling a Banker conducted group or individual video education with verbal and written material and guidebook.  There is a strong connection between our food choices and our health. Diseases like obesity and type 2 diabetes are very prevalent and are in large-part due to lifestyle choices. The Pritikin Eating Plan provides plenty of food and hunger-curbing satisfaction. It is easy to follow, affordable, and helps reduce health risks.  Menu Workshop  Clinical staff conducted group or individual video education with verbal and written material and guidebook.  Patient learns that restaurant meals can sabotage health goals because they are often packed with calories, fat, sodium, and sugar. Recommendations include strategies to plan ahead and to communicate with the manager, chef, or server to help order a healthier meal.  Planning Your Eating Strategy  Clinical staff conducted group or individual video education with verbal and written material and guidebook.  Patient learns about the Pritikin Eating Plan and its benefit of reducing the  risk of disease. The Pritikin Eating Plan does not focus on calories. Instead, it emphasizes  high-quality, nutrient-rich foods. By knowing the characteristics of the foods, we choose, we can determine their calorie density and make informed decisions.  Targeting Your Nutrition Priorities  Clinical staff conducted group or individual video education with verbal and written material and guidebook.  Patient learns that lifestyle habits have a tremendous impact on disease risk and progression. This video provides eating and physical activity recommendations based on your personal health goals, such as reducing LDL cholesterol, losing weight, preventing or controlling type 2 diabetes, and reducing high blood pressure.  Vitamins and Minerals  Clinical staff conducted group or individual video education with verbal and written material and guidebook.  Patient learns different ways to obtain key vitamins and minerals, including through a recommended healthy diet. It is important to discuss all supplements you take with your doctor.   Healthy Mind-Set    Smoking Cessation  Clinical staff conducted group or individual video education with verbal and written material and guidebook.  Patient learns that cigarette smoking and tobacco addiction pose a serious health risk which affects millions of people. Stopping smoking will significantly reduce the risk of heart disease, lung disease, and many forms of cancer. Recommended strategies for quitting are covered, including working with your doctor to develop a successful plan.  Culinary   Becoming a Set designer conducted group or individual video education with verbal and written material and guidebook.  Patient learns that cooking at home can be healthy, cost-effective, quick, and puts them in control. Keys to cooking healthy recipes will include looking at your recipe, assessing your equipment needs, planning ahead, making it simple, choosing  cost-effective seasonal ingredients, and limiting the use of added fats, salts, and sugars.  Cooking - Breakfast and Snacks  Clinical staff conducted group or individual video education with verbal and written material and guidebook.  Patient learns how important breakfast is to satiety and nutrition through the entire day. Recommendations include key foods to eat during breakfast to help stabilize blood sugar levels and to prevent overeating at meals later in the day. Planning ahead is also a key component.  Cooking - Educational psychologist conducted group or individual video education with verbal and written material and guidebook.  Patient learns eating strategies to improve overall health, including an approach to cook more at home. Recommendations include thinking of animal protein as a side on your plate rather than center stage and focusing instead on lower calorie dense options like vegetables, fruits, whole grains, and plant-based proteins, such as beans. Making sauces in large quantities to freeze for later and leaving the skin on your vegetables are also recommended to maximize your experience.  Cooking - Healthy Salads and Dressing Clinical staff conducted group or individual video education with verbal and written material and guidebook.  Patient learns that vegetables, fruits, whole grains, and legumes are the foundations of the Pritikin Eating Plan. Recommendations include how to incorporate each of these in flavorful and healthy salads, and how to create homemade salad dressings. Proper handling of ingredients is also covered. Cooking - Soups and [employer] - Soups and Desserts Clinical staff conducted group or individual video education with verbal and written material and guidebook.  Patient learns that Pritikin soups and desserts make for easy, nutritious, and delicious snacks and meal components that are low in sodium, fat, sugar, and calorie density, while high in  vitamins, minerals, and filling fiber. Recommendations include simple and healthy ideas for soups and desserts.   Overview  The Pritikin Solution Program Overview Clinical staff conducted group or individual video education with verbal and written material and guidebook.  Patient learns that the results of the Pritikin Program have been documented in more than 100 articles published in peer-reviewed journals, and the benefits include reducing risk factors for (and, in some cases, even reversing) high cholesterol, high blood pressure, type 2 diabetes, obesity, and more! An overview of the three key pillars of the Pritikin Program will be covered: eating well, doing regular exercise, and having a healthy mind-set.  WORKSHOPS  Exercise: Exercise Basics: Building Your Action Plan Clinical staff led group instruction and group discussion with PowerPoint presentation and patient guidebook. To enhance the learning environment the use of posters, models and videos may be added. At the conclusion of this workshop, patients will comprehend the difference between physical activity and exercise, as well as the benefits of incorporating both, into their routine. Patients will understand the FITT (Frequency, Intensity, Time, and Type) principle and how to use it to build an exercise action plan. In addition, safety concerns and other considerations for exercise and cardiac rehab will be addressed by the presenter. The purpose of this lesson is to promote a comprehensive and effective weekly exercise routine in order to improve patients' overall level of fitness.   Managing Heart Disease: Your Path to a Healthier Heart Clinical staff led group instruction and group discussion with PowerPoint presentation and patient guidebook. To enhance the learning environment the use of posters, models and videos may be added.At the conclusion of this workshop, patients will understand the anatomy and physiology of the  heart. Additionally, they will understand how Pritikin's three pillars impact the risk factors, the progression, and the management of heart disease.  The purpose of this lesson is to provide a high-level overview of the heart, heart disease, and how the Pritikin lifestyle positively impacts risk factors.  Exercise Biomechanics Clinical staff led group instruction and group discussion with PowerPoint presentation and patient guidebook. To enhance the learning environment the use of posters, models and videos may be added. Patients will learn how the structural parts of their bodies function and how these functions impact their daily activities, movement, and exercise. Patients will learn how to promote a neutral spine, learn how to manage pain, and identify ways to improve their physical movement in order to promote healthy living. The purpose of this lesson is to expose patients to common physical limitations that impact physical activity. Participants will learn practical ways to adapt and manage aches and pains, and to minimize their effect on regular exercise. Patients will learn how to maintain good posture while sitting, walking, and lifting.  Balance Training and Fall Prevention  Clinical staff led group instruction and group discussion with PowerPoint presentation and patient guidebook. To enhance the learning environment the use of posters, models and videos may be added. At the conclusion of this workshop, patients will understand the importance of their sensorimotor skills (vision, proprioception, and the vestibular system) in maintaining their ability to balance as they age. Patients will apply a variety of balancing exercises that are appropriate for their current level of function. Patients will understand the common causes for poor balance, possible solutions to these problems, and ways to modify their physical environment in order to minimize their fall risk. The purpose of this  lesson is to teach patients about the importance of maintaining balance as they age and ways to minimize their risk of falling.  WORKSHOPS   Nutrition:  Fueling  a Healthy Body Clinical staff led group instruction and group discussion with PowerPoint presentation and patient guidebook. To enhance the learning environment the use of posters, models and videos may be added. Patients will review the foundational principles of the Pritikin Eating Plan and understand what constitutes a serving size in each of the food groups. Patients will also learn Pritikin-friendly foods that are better choices when away from home and review make-ahead meal and snack options. Calorie density will be reviewed and applied to three nutrition priorities: weight maintenance, weight loss, and weight gain. The purpose of this lesson is to reinforce (in a group setting) the key concepts around what patients are recommended to eat and how to apply these guidelines when away from home by planning and selecting Pritikin-friendly options. Patients will understand how calorie density may be adjusted for different weight management goals.  Mindful Eating  Clinical staff led group instruction and group discussion with PowerPoint presentation and patient guidebook. To enhance the learning environment the use of posters, models and videos may be added. Patients will briefly review the concepts of the Pritikin Eating Plan and the importance of low-calorie dense foods. The concept of mindful eating will be introduced as well as the importance of paying attention to internal hunger signals. Triggers for non-hunger eating and techniques for dealing with triggers will be explored. The purpose of this lesson is to provide patients with the opportunity to review the basic principles of the Pritikin Eating Plan, discuss the value of eating mindfully and how to measure internal cues of hunger and fullness using the Hunger Scale. Patients will also  discuss reasons for non-hunger eating and learn strategies to use for controlling emotional eating.  Targeting Your Nutrition Priorities Clinical staff led group instruction and group discussion with PowerPoint presentation and patient guidebook. To enhance the learning environment the use of posters, models and videos may be added. Patients will learn how to determine their genetic susceptibility to disease by reviewing their family history. Patients will gain insight into the importance of diet as part of an overall healthy lifestyle in mitigating the impact of genetics and other environmental insults. The purpose of this lesson is to provide patients with the opportunity to assess their personal nutrition priorities by looking at their family history, their own health history and current risk factors. Patients will also be able to discuss ways of prioritizing and modifying the Pritikin Eating Plan for their highest risk areas  Menu  Clinical staff led group instruction and group discussion with PowerPoint presentation and patient guidebook. To enhance the learning environment the use of posters, models and videos may be added. Using menus brought in from [employer], or printed from [employer], patients will apply the Pritikin dining out guidelines that were presented in the [employer] video. Patients will also be able to practice these guidelines in a variety of provided scenarios. The purpose of this lesson is to provide patients with the opportunity to practice hands-on learning of the Pritikin Dining Out guidelines with actual menus and practice scenarios.  Label Reading Clinical staff led group instruction and group discussion with PowerPoint presentation and patient guidebook. To enhance the learning environment the use of posters, models and videos may be added. Patients will review and discuss the Pritikin label reading guidelines presented in Pritikin's Label Reading  Educational series video. Using fool labels brought in from local grocery stores and markets, patients will apply the label reading guidelines and determine if the packaged food  meet the Pritikin guidelines. The purpose of this lesson is to provide patients with the opportunity to review, discuss, and practice hands-on learning of the Pritikin Label Reading guidelines with actual packaged food labels. Cooking School  Pritikin's [employer] are designed to teach patients ways to prepare quick, simple, and affordable recipes at home. The importance of nutrition's role in chronic disease risk reduction is reflected in its emphasis in the overall Pritikin program. By learning how to prepare essential core Pritikin Eating Plan recipes, patients will increase control over what they eat; be able to customize the flavor of foods without the use of added salt, sugar, or fat; and improve the quality of the food they consume. By learning a set of core recipes which are easily assembled, quickly prepared, and affordable, patients are more likely to prepare more healthy foods at home. These workshops focus on convenient breakfasts, simple entres, side dishes, and desserts which can be prepared with minimal effort and are consistent with nutrition recommendations for cardiovascular risk reduction. Cooking [employer] are taught by a Armed forces logistics/support/administrative officer (RD) who has been trained by the [employer]. The chef or RD has a clear understanding of the importance of minimizing - if not completely eliminating - added fat, sugar, and sodium in recipes. Throughout the series of Cooking School Workshop sessions, patients will learn about healthy ingredients and efficient methods of cooking to build confidence in their capability to prepare    Cooking School weekly topics:  Adding Flavor- Sodium-Free  Fast and Healthy Breakfasts  Powerhouse Plant-Based Proteins  Satisfying Salads and  Dressings  Simple Sides and Sauces  International Cuisine-Spotlight on the [employer] Zones  Delicious Desserts  Savory Soups  [employer] - Meals in a Astronomer Appetizers and Snacks  Comforting Weekend Breakfasts  One-Pot Wonders   Fast Evening Meals  Landscape architect Your Pritikin Plate  WORKSHOPS   Healthy Mindset (Psychosocial):  Focused Goals, Sustainable Changes Clinical staff led group instruction and group discussion with PowerPoint presentation and patient guidebook. To enhance the learning environment the use of posters, models and videos may be added. Patients will be able to apply effective goal setting strategies to establish at least one personal goal, and then take consistent, meaningful action toward that goal. They will learn to identify common barriers to achieving personal goals and develop strategies to overcome them. Patients will also gain an understanding of how our mind-set can impact our ability to achieve goals and the importance of cultivating a positive and growth-oriented mind-set. The purpose of this lesson is to provide patients with a deeper understanding of how to set and achieve personal goals, as well as the tools and strategies needed to overcome common obstacles which may arise along the way.  From Head to Heart: The Power of a Healthy Outlook  Clinical staff led group instruction and group discussion with PowerPoint presentation and patient guidebook. To enhance the learning environment the use of posters, models and videos may be added. Patients will be able to recognize and describe the impact of emotions and mood on physical health. They will discover the importance of self-care and explore self-care practices which may work for them. Patients will also learn how to utilize the 4 C's to cultivate a healthier outlook and better manage stress and challenges. The purpose of this lesson is to demonstrate to patients how a healthy outlook  is an essential part of maintaining good health, especially as they continue  their cardiac rehab journey.  Healthy Sleep for a Healthy Heart Clinical staff led group instruction and group discussion with PowerPoint presentation and patient guidebook. To enhance the learning environment the use of posters, models and videos may be added. At the conclusion of this workshop, patients will be able to demonstrate knowledge of the importance of sleep to overall health, well-being, and quality of life. They will understand the symptoms of, and treatments for, common sleep disorders. Patients will also be able to identify daytime and nighttime behaviors which impact sleep, and they will be able to apply these tools to help manage sleep-related challenges. The purpose of this lesson is to provide patients with a general overview of sleep and outline the importance of quality sleep. Patients will learn about a few of the most common sleep disorders. Patients will also be introduced to the concept of "sleep hygiene," and discover ways to self-manage certain sleeping problems through simple daily behavior changes. Finally, the workshop will motivate patients by clarifying the links between quality sleep and their goals of heart-healthy living.   Recognizing and Reducing Stress Clinical staff led group instruction and group discussion with PowerPoint presentation and patient guidebook. To enhance the learning environment the use of posters, models and videos may be added. At the conclusion of this workshop, patients will be able to understand the types of stress reactions, differentiate between acute and chronic stress, and recognize the impact that chronic stress has on their health. They will also be able to apply different coping mechanisms, such as reframing negative self-talk. Patients will have the opportunity to practice a variety of stress management techniques, such as deep abdominal breathing, progressive muscle  relaxation, and/or guided imagery.  The purpose of this lesson is to educate patients on the role of stress in their lives and to provide healthy techniques for coping with it.  Learning Barriers/Preferences:  Learning Barriers/Preferences - 12/15/23 1029       Learning Barriers/Preferences   Learning Barriers Exercise Concerns   Chronic lower back pain,  occasional lightheaded when changing postions   Learning Preferences Pictoral;Skilled Demonstration          Education Topics:  Knowledge Questionnaire Score:  Knowledge Questionnaire Score - 12/15/23 1152       Knowledge Questionnaire Score   Pre Score 23/24          Core Components/Risk Factors/Patient Goals at Admission:  Personal Goals and Risk Factors at Admission - 12/15/23 1013       Core Components/Risk Factors/Patient Goals on Admission    Weight Management Weight Loss;Yes    Intervention Weight Management: Provide education and appropriate resources to help participant work on and attain dietary goals.;Weight Management: Develop a combined nutrition and exercise program designed to reach desired caloric intake, while maintaining appropriate intake of nutrient and fiber, sodium and fats, and appropriate energy expenditure required for the weight goal.    Admit Weight 183 lb 1.6 oz (83.1 kg)    Goal Weight: Short Term 180 lb (81.6 kg)    Goal Weight: Long Term 170 lb (77.1 kg)    Expected Outcomes Short Term: Continue to assess and modify interventions until short term weight is achieved;Long Term: Adherence to nutrition and physical activity/exercise program aimed toward attainment of established weight goal;Weight Loss: Understanding of general recommendations for a balanced deficit meal plan, which promotes 1-2 lb weight loss per week and includes a negative energy balance of [phone] kcal/d    Hypertension Yes    Intervention Provide  education on lifestyle modifcations including regular physical activity/exercise,  weight management, moderate sodium restriction and increased consumption of fresh fruit, vegetables, and low fat dairy, alcohol moderation, and smoking cessation.;Monitor prescription use compliance.    Expected Outcomes Short Term: Continued assessment and intervention until BP is < 140/83mm HG in hypertensive participants. < 130/87mm HG in hypertensive participants with diabetes, heart failure or chronic kidney disease.;Long Term: Maintenance of blood pressure at goal levels.    Lipids Yes    Intervention Provide education and support for participant on nutrition & aerobic/resistive exercise along with prescribed medications to achieve LDL 70mg , HDL >40mg .    Expected Outcomes Short Term: Participant states understanding of desired cholesterol values and is compliant with medications prescribed. Participant is following exercise prescription and nutrition guidelines.;Long Term: Cholesterol controlled with medications as prescribed, with individualized exercise RX and with personalized nutrition plan. Value goals: LDL < 70mg , HDL > 40 mg.    Stress Yes    Intervention Offer individual and/or small group education and counseling on adjustment to heart disease, stress management and health-related lifestyle change. Teach and support self-help strategies.;Refer participants experiencing significant psychosocial distress to appropriate mental health specialists for further evaluation and treatment. When possible, include family members and significant others in education/counseling sessions.    Expected Outcomes Short Term: Participant demonstrates changes in health-related behavior, relaxation and other stress management skills, ability to obtain effective social support, and compliance with psychotropic medications if prescribed.;Long Term: Emotional wellbeing is indicated by absence of clinically significant psychosocial distress or social isolation.    Personal Goal Other Yes    Personal Goal Decrease  BMI. Maintain weight between 170-180 lbs. Eat more fruits and vegetables, less salt. Reduce/ remove anxiety medication. Maintain or reduce depression medication. Meditate more.    Intervention [name] will attend Exercise, Nutrtion, and Healthy Mind-Set workshops to help achieve personal health and fitness goals.    Expected Outcomes [name] will achieve and maintain weight goal. He will know and practice meditation techniques. He will incorporate more fruits and vegetables in his diet. He will develop and Exercise Action Plan he can maintain to increase strength and stamina as measured by self-report and grip strenght and walk tests.          Core Components/Risk Factors/Patient Goals Review:   Goals and Risk Factor Review     Row Name 12/24/23 0908 01/05/24 1023 02/01/24 0806 02/23/24 1505       Core Components/Risk Factors/Patient Goals Review   Personal Goals Review Weight Management/Obesity;Stress;Lipids;Hypertension Weight Management/Obesity;Stress;Lipids;Hypertension Weight Management/Obesity;Stress;Lipids;Hypertension Weight Management/Obesity;Stress;Lipids;Hypertension    Review [name] started cardiac rehab on 12/23/23. [name] did well with exercise. Vital signs were stable. 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 Vital signs have been stable. [name] has lost 1 kg since starting the program [name] is doing  well with exercise at cardiac rehab. Vital signs have been stable. [name] has been increasing his workloads. [name] continues to do  well with exercise at cardiac rehab. Vital signs have been stable. [name] has been increasing his workloads. [name] has gained 3.1 kg since starting the program.    Expected Outcomes [name] will continue to participate in cardiac rehab for exercise, nutrition and lifestyle modifications [name] will continue to participate in cardiac rehab for exercise,  nutrition and lifestyle modifications [name] will continue to participate in cardiac rehab for exercise, nutrition and lifestyle modifications [name] will continue to  participate in cardiac rehab for exercise, nutrition and lifestyle modifications       Core Components/Risk Factors/Patient Goals at Discharge (Final Review):   Goals and Risk Factor Review - 02/23/24 1505       Core Components/Risk Factors/Patient Goals Review   Personal Goals Review Weight Management/Obesity;Stress;Lipids;Hypertension    Review [name] continues to do  well with exercise at cardiac rehab. Vital signs have been stable. [name] has been increasing his workloads. [name] has gained 3.1 kg since starting the program.    Expected Outcomes [name] will continue to participate in cardiac rehab for exercise, nutrition and lifestyle modifications          ITP Comments:  ITP Comments     Row Name 12/15/23 9082 12/24/23 0858 01/05/24 1012 02/01/24 0800 02/23/24 1500   ITP Comments Medical Director- Dr. Wilbert Bihari, MD. Introduction to the [facility] / Intensive Cardiac Rehab Program. Reviewed initial orientation folder with [name]. 30 Day ITP Review. [name] started cardiac rehab on 12/23/23. [name] did well with exercise 30 Day ITP Review. [name] is off to a good start to exercise at  cardiac rehab 30 Day ITP Review. [name] has good participation with exercise at  cardiac rehab. [name] has been absent recently due his family members being ill. 30 Day ITP Review. [name] has good participation and attendance with exercise at  cardiac rehab.      Comments: See ITP Comments

## 2024-03-02 ENCOUNTER — Encounter (HOSPITAL_COMMUNITY)
Admission: RE | Admit: 2024-03-02 | Discharge: 2024-03-02 | Disposition: A | Discharge status: Home / Self Care | Source: Ambulatory Visit | Attending: Cardiology | Admitting: Cardiology

## 2024-03-02 DIAGNOSIS — Z941 Heart transplant status: Secondary | ICD-10-CM | POA: Diagnosis not present

## 2024-03-04 ENCOUNTER — Encounter (HOSPITAL_COMMUNITY)
Admission: RE | Admit: 2024-03-04 | Discharge: 2024-03-04 | Disposition: A | Discharge status: Home / Self Care | Source: Ambulatory Visit | Attending: Cardiology | Admitting: Cardiology

## 2024-03-04 DIAGNOSIS — Z941 Heart transplant status: Secondary | ICD-10-CM

## 2024-03-07 ENCOUNTER — Encounter (HOSPITAL_COMMUNITY)
Admission: RE | Admit: 2024-03-07 | Discharge: 2024-03-07 | Disposition: A | Discharge status: Home / Self Care | Source: Ambulatory Visit | Attending: Cardiology | Admitting: Cardiology

## 2024-03-07 DIAGNOSIS — Z941 Heart transplant status: Secondary | ICD-10-CM

## 2024-03-09 ENCOUNTER — Encounter (HOSPITAL_COMMUNITY)
Admission: RE | Admit: 2024-03-09 | Discharge: 2024-03-09 | Disposition: A | Discharge status: Home / Self Care | Source: Ambulatory Visit | Attending: Cardiology

## 2024-03-09 DIAGNOSIS — Z941 Heart transplant status: Secondary | ICD-10-CM | POA: Diagnosis not present

## 2024-03-10 ENCOUNTER — Encounter: Payer: Self-pay | Admitting: Family Medicine

## 2024-03-11 ENCOUNTER — Encounter (HOSPITAL_COMMUNITY)
Admission: RE | Admit: 2024-03-11 | Discharge: 2024-03-11 | Disposition: A | Discharge status: Home / Self Care | Source: Ambulatory Visit | Attending: Cardiology | Admitting: Cardiology

## 2024-03-11 ENCOUNTER — Telehealth: Payer: Self-pay | Admitting: Family Medicine

## 2024-03-11 DIAGNOSIS — Z941 Heart transplant status: Secondary | ICD-10-CM

## 2024-03-11 NOTE — Telephone Encounter (Signed)
 When [facility] called the office advised Dr Watt specializes in Sports Medicine and the patient does not need a new patient appointment with him, to schedule a ov/fu for the knee pain.  Lacinda states: that's not what my tree says and sent CRM   Copied from CRM [id number]. Topic: Appointments - Scheduling Inquiry for Clinic >> Mar 11, 2024  1:24 PM [name] wrote: Reason for CRM: Patient calling to schedule appt with Dr Watt per note: Short, [name] Y, CMA to [name]   03/11/24  7:29 AM Hi [name] There are a few options. You can set up visit with Dr. Randeen to have her take a look or you can get set up with Dr. Watt our sports medicine doctor at our office. Just give us  a call to get that set up.  [name], CMA  Spoke to [name], who stated decision tree: patient is considered an established patient.  appt type: office visit and    I am unable to schedule this with Dr Watt. Please call patient to schedule appt.   [name], [name] [phone]    ----------------------------------------------------------------------- From previous Reason for Contact - Scheduling: Patient/patient representative is calling to schedule an appointment. Refer to attachments for appointment information.

## 2024-03-14 ENCOUNTER — Encounter (HOSPITAL_COMMUNITY)
Admission: RE | Admit: 2024-03-14 | Discharge: 2024-03-14 | Disposition: A | Discharge status: Home / Self Care | Source: Ambulatory Visit | Attending: Cardiology | Admitting: Cardiology

## 2024-03-14 DIAGNOSIS — Z941 Heart transplant status: Secondary | ICD-10-CM

## 2024-03-16 ENCOUNTER — Encounter (HOSPITAL_COMMUNITY)
Admission: RE | Admit: 2024-03-16 | Discharge: 2024-03-16 | Disposition: A | Discharge status: Home / Self Care | Source: Ambulatory Visit | Attending: Cardiology | Admitting: Cardiology

## 2024-03-16 DIAGNOSIS — Z941 Heart transplant status: Secondary | ICD-10-CM

## 2024-03-18 ENCOUNTER — Encounter (HOSPITAL_COMMUNITY): Admission: RE | Admit: 2024-03-18 | Source: Ambulatory Visit

## 2024-03-21 ENCOUNTER — Encounter (HOSPITAL_COMMUNITY)
Admission: RE | Admit: 2024-03-21 | Discharge: 2024-03-21 | Disposition: A | Discharge status: Home / Self Care | Source: Ambulatory Visit | Attending: Cardiology | Admitting: Cardiology

## 2024-03-21 VITALS — BP 122/72 | HR 63 | Ht 69.5 in | Wt 192.2 lb

## 2024-03-21 DIAGNOSIS — Z941 Heart transplant status: Secondary | ICD-10-CM | POA: Diagnosis present

## 2024-03-21 NOTE — Progress Notes (Signed)
 Discharge Progress Report  Patient Details  Name: [name] MRN: [id number] Date of Birth: May 25, 1976 Referring Provider:   Flowsheet Row INTENSIVE CARDIAC REHAB ORIENT from 12/15/2023 in [facility]  Referring Provider [name] Danforth, [name] [facility])  [name], [name] SAUNDERS, [name] (coverage)]     Number of Visits: 39  Reason for Discharge:  Patient reached a stable level of exercise. Patient independent in their exercise. Patient has met program and personal goals.  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  ADL [facility]:   Initial Exercise Prescription:  Initial Exercise Prescription - 12/15/23 1100       Date of Initial Exercise RX and Referring Provider   Date 12/15/23    Referring Provider [name] Danforth, [name] [facility])   [name] [name] SAUNDERS, [name] (coverage)   Expected Discharge Date 03/09/24      Treadmill   MPH 2.5    Grade 0    Minutes 15    METs 2.91      NuStep   Level 2    SPM 85    Minutes 15    METs 2.9      Prescription Details   Frequency (times per week) 3    Duration Progress to 30 minutes of continuous aerobic without signs/symptoms of physical distress      Intensity   THRR 40-80% of Max Heartrate 70-140    Ratings of Perceived Exertion 11-13    Perceived Dyspnea 0-4      Progression   Progression Continue to progress workloads to maintain intensity without signs/symptoms of physical distress.      Resistance Training   Training Prescription Yes    Weight 3 lbs    Reps 10-15          Discharge Exercise Prescription (Final Exercise Prescription Changes):  Exercise Prescription Changes - 02/29/24 1032        Response to Exercise   Blood Pressure (Admit) 110/60    Blood Pressure (Exit) 102/66    Heart Rate (Admit) 73 bpm    Heart Rate (Exercise) 125 bpm    Heart Rate (Exit) 84 bpm    Rating of Perceived Exertion (Exercise) 11    Symptoms None    Duration Continue with 30 min of aerobic exercise without signs/symptoms of physical distress.    Intensity THRR unchanged      Progression   Progression Continue to progress workloads to maintain intensity without signs/symptoms of physical distress.    Average METs 4      Resistance Training   Training Prescription Yes    Weight 4 lbs    Reps 10-15    Time 10 Minutes      Interval Training   Interval Training No      Treadmill   MPH 3    Grade 0    Minutes 15    METs 3.3      NuStep   Level 6    SPM 93    Minutes 15    METs 4.7      Home Exercise Plan   Plans to continue exercise at Home (comment)  Walking   Frequency Add 3 additional days to program exercise sessions.    Initial Home Exercises Provided 01/25/24          Functional Capacity:  6 Minute Walk     Row Name 12/15/23 1055 03/16/24 1038       6 Minute Walk   Phase Initial Discharge    Distance 1321 feet 1800 feet    Distance % Change -- 36.26 %    Distance Feet Change -- 479 ft    Walk Time 6 minutes 6 minutes    # of Rest Breaks 0 0    MPH 2.5 3.41    METS 4.37 5.03    RPE 10 11    Perceived Dyspnea  1 1    VO2 Peak 15.29 17.61    Symptoms Yes (comment) Yes (comment)    Comments Mild shortness of breath Mild shortness of breath. Bilateral knee pain, chronic 4/10 on the pain scale.    Resting HR 86 bpm 72 bpm    Resting BP 124/78 122/78    Resting Oxygen Saturation  98 % --    Exercise Oxygen Saturation  during 6 min walk 98 % 98 %    Max Ex. HR 91 bpm 85 bpm    Max Ex. BP 140/82 134/70    2 Minute Post BP 136/80 116/64       Psychological, QOL, Others - Outcomes: PHQ 2/9:    03/07/2024    5:01 PM 12/15/2023   10:20 AM 09/30/2023    9:53  AM 08/31/2023    4:16 PM 07/21/2023   12:27 PM  Depression screen PHQ 2/9  Decreased Interest 1 0 2 1 3   Down, Depressed, Hopeless 1 1 2 1 3   PHQ - 2 Score 2 1 4 2 6   Altered sleeping 1 1 2 1 3   Tired, decreased energy 1 1 3 2 3   Change in appetite 1 0 1 0 2  Feeling bad or failure about yourself  1 1 2 1 3   Trouble concentrating 1 1 1 1 3   Moving slowly or fidgety/restless 1 0 1 1 2   Suicidal thoughts 0 0 0 0 0  PHQ-9 Score 8 5 14 8 22   Difficult doing work/chores Somewhat difficult Extremely dIfficult Extremely dIfficult Extremely dIfficult Extremely dIfficult    Quality of Life:  Quality of Life - 03/07/24 1700       Quality of Life   Select Quality of Life      Quality of Life Scores   Health/Function Pre 11.3 %    Health/Function Post 13.3 %    Health/Function % Change 17.7 %    Socioeconomic Pre 21.07 %    Socioeconomic Post 23.79 %    Socioeconomic % Change  12.91 %    Psych/Spiritual Pre 15.07 %    Psych/Spiritual Post 14.71 %    Psych/Spiritual % Change -2.39 %    Family Pre 27.6 %    Family Post 27.6 %    Family % Change 0 %    GLOBAL Pre 16.49 %    GLOBAL Post 17.85 %    GLOBAL % Change 8.25 %          Personal Goals: Goals established at orientation with interventions provided to work toward goal.  Personal Goals and Risk Factors at Admission - 12/15/23 1013       Core Components/Risk Factors/Patient Goals on Admission    Weight Management Weight Loss;Yes    Intervention  Weight Management: Provide education and appropriate resources to help participant work on and attain dietary goals.;Weight Management: Develop a combined nutrition and exercise program designed to reach desired caloric intake, while maintaining appropriate intake of nutrient and fiber, sodium and fats, and appropriate energy expenditure required for the weight goal.    Admit Weight 183 lb 1.6 oz (83.1 kg)    Goal Weight: Short Term 180 lb (81.6 kg)    Goal Weight: Long Term 170 lb  (77.1 kg)    Expected Outcomes Short Term: Continue to assess and modify interventions until short term weight is achieved;Long Term: Adherence to nutrition and physical activity/exercise program aimed toward attainment of established weight goal;Weight Loss: Understanding of general recommendations for a balanced deficit meal plan, which promotes 1-2 lb weight loss per week and includes a negative energy balance of [phone] kcal/d    Hypertension Yes    Intervention Provide education on lifestyle modifcations including regular physical activity/exercise, weight management, moderate sodium restriction and increased consumption of fresh fruit, vegetables, and low fat dairy, alcohol moderation, and smoking cessation.;Monitor prescription use compliance.    Expected Outcomes Short Term: Continued assessment and intervention until BP is < 140/61mm HG in hypertensive participants. < 130/98mm HG in hypertensive participants with diabetes, heart failure or chronic kidney disease.;Long Term: Maintenance of blood pressure at goal levels.    Lipids Yes    Intervention Provide education and support for participant on nutrition & aerobic/resistive exercise along with prescribed medications to achieve LDL 70mg , HDL >40mg .    Expected Outcomes Short Term: Participant states understanding of desired cholesterol values and is compliant with medications prescribed. Participant is following exercise prescription and nutrition guidelines.;Long Term: Cholesterol controlled with medications as prescribed, with individualized exercise RX and with personalized nutrition plan. Value goals: LDL < 70mg , HDL > 40 mg.    Stress Yes    Intervention Offer individual and/or small group education and counseling on adjustment to heart disease, stress management and health-related lifestyle change. Teach and support self-help strategies.;Refer participants experiencing significant psychosocial distress to appropriate mental health  specialists for further evaluation and treatment. When possible, include family members and significant others in education/counseling sessions.    Expected Outcomes Short Term: Participant demonstrates changes in health-related behavior, relaxation and other stress management skills, ability to obtain effective social support, and compliance with psychotropic medications if prescribed.;Long Term: Emotional wellbeing is indicated by absence of clinically significant psychosocial distress or social isolation.    Personal Goal Other Yes    Personal Goal Decrease BMI. Maintain weight between 170-180 lbs. Eat more fruits and vegetables, less salt. Reduce/ remove anxiety medication. Maintain or reduce depression medication. Meditate more.    Intervention [name] will attend Exercise, Nutrtion, and Healthy Mind-Set workshops to help achieve personal health and fitness goals.    Expected Outcomes [name] will achieve and maintain weight goal. He will know and practice meditation techniques. He will incorporate more fruits and vegetables in his diet. He will develop and Exercise Action Plan he can maintain to increase strength and stamina as measured by self-report and grip strenght and walk tests.           Personal Goals Discharge:  Goals and Risk Factor Review     Row Name 12/24/23 0908 01/05/24 1023 02/01/24 0806 02/23/24 1505       Core Components/Risk Factors/Patient Goals Review   Personal Goals Review Weight Management/Obesity;Stress;Lipids;Hypertension Weight Management/Obesity;Stress;Lipids;Hypertension Weight Management/Obesity;Stress;Lipids;Hypertension Weight Management/Obesity;Stress;Lipids;Hypertension    Review [name] started cardiac rehab on 12/23/23. [name]  did well with exercise. Vital signs were stable. 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 Vital signs have been stable.  [name] has lost 1 kg since starting the program [name] is doing  well with exercise at cardiac rehab. Vital signs have been stable. [name] has been increasing his workloads. [name] continues to do  well with exercise at cardiac rehab. Vital signs have been stable. [name] has been increasing his workloads. [name] has gained 3.1 kg since starting the program.    Expected Outcomes [name] will continue to participate in cardiac rehab for exercise, nutrition and lifestyle modifications [name] will continue to participate in cardiac rehab for exercise, nutrition and lifestyle modifications [name] will continue to participate in cardiac rehab for exercise, nutrition and lifestyle modifications [name] will continue to participate in cardiac rehab for exercise, nutrition and lifestyle modifications       Exercise Goals and Review:  Exercise Goals     Row Name 12/15/23 0926             Exercise Goals   Increase Physical Activity Yes       Intervention Provide advice, education, support and counseling about physical activity/exercise needs.;Develop an individualized exercise prescription for aerobic and resistive training based on initial evaluation findings, risk stratification, comorbidities and participant's personal goals.       Expected Outcomes Short Term: Attend rehab on a regular basis to increase amount of physical activity.;Long Term: Exercising regularly at least 3-5 days a week.;Long Term: Add in home exercise to make exercise part of routine and to increase amount of physical activity.       Increase Strength and Stamina Yes       Intervention Provide advice, education, support and counseling about physical activity/exercise needs.;Develop an individualized exercise prescription for aerobic and resistive training based on initial evaluation findings, risk stratification, comorbidities and participant's personal goals.       Expected Outcomes Short Term: Increase workloads from initial exercise  prescription for resistance, speed, and METs.;Short Term: Perform resistance training exercises routinely during rehab and add in resistance training at home;Long Term: Improve cardiorespiratory fitness, muscular endurance and strength as measured by increased METs and functional capacity ( )       Able to understand and use rate of perceived exertion (RPE) scale Yes       Intervention Provide education and explanation on how to use RPE scale       Expected Outcomes Short Term: Able to use RPE daily in rehab to express subjective intensity level;Long Term:  Able to use RPE to guide intensity level when exercising independently       Knowledge and understanding of Target Heart Rate Range (THRR) Yes       Intervention Provide education and explanation of THRR including how the numbers were predicted and where they are located for reference       Expected Outcomes Short Term: Able to state/look up THRR;Long Term: Able to use THRR to govern intensity when exercising independently;Short Term: Able to use daily as guideline for intensity in rehab       Able to check pulse independently Yes       Intervention Provide education and demonstration on how to check pulse in carotid and radial arteries.;Review the importance of being able to check your own pulse for safety during independent exercise       Expected Outcomes Short  Term: Able to explain why pulse checking is important during independent exercise;Long Term: Able to check pulse independently and accurately       Understanding of Exercise Prescription Yes       Intervention Provide education, explanation, and written materials on patient's individual exercise prescription       Expected Outcomes Short Term: Able to explain program exercise prescription;Long Term: Able to explain home exercise prescription to exercise independently          Exercise Goals Re-Evaluation:  Exercise Goals Re-Evaluation     Row Name 12/23/23 1124 01/04/24 1048  01/25/24 1117 02/22/24 1107 03/16/24 1048     Exercise Goal Re-Evaluation   Exercise Goals Review Increase Physical Activity;Increase Strength and Stamina;Able to understand and use rate of perceived exertion (RPE) scale Increase Physical Activity;Increase Strength and Stamina;Able to understand and use rate of perceived exertion (RPE) scale Increase Physical Activity;Increase Strength and Stamina;Able to understand and use rate of perceived exertion (RPE) scale;Knowledge and understanding of Target Heart Rate Range (THRR);Able to check pulse independently;Understanding of Exercise Prescription Increase Physical Activity;Increase Strength and Stamina;Able to understand and use rate of perceived exertion (RPE) scale;Knowledge and understanding of Target Heart Rate Range (THRR);Able to check pulse independently;Understanding of Exercise Prescription Increase Physical Activity;Increase Strength and Stamina;Able to understand and use rate of perceived exertion (RPE) scale;Knowledge and understanding of Target Heart Rate Range (THRR);Able to check pulse independently;Understanding of Exercise Prescription   Comments [name] was able to understand and use RPE scale appropriately. [name] is doing some walking at home in addition to exercise at cardiac rehab. his goal is to increase stamina and strength. Reviewed exercise prescription with [name]. Encouraged to accumulate 150 minutes of aerobic exercise/ week. He has a heart rate monitor to check his pulse. [name] continues to make steady progress with exercise. He would like to extend through 03/21/24 to make up missed sessions. [name] will complete cardiac rehab next week. He plans to walk 15-30 minutes, 3-7 days/week with a goal of walking daily. He also plans to set up a meditation/ yoga rrom. He's looking into getting a small treadmill for home use. His functional capacity increased 36% as measured by , strength increased 24% as measured by grip strength test.    Expected Outcomes Progress workloads as tolerated to help improve cardiorespiratory ftness. Continue to progress workloads as tolerated to help increase stamina and strength. [name] will walk 30 minutes at least 2 days/week at home in addition to exercise at cardiac rehab. Continue to progress workloads as tolerated. [name] will continue home exercise to maintain health and fitness gains.      Nutrition & Weight - Outcomes:  Pre Biometrics - 12/15/23 9082       Pre Biometrics   Waist Circumference 36.5 inches    Hip Circumference 40.25 inches    Waist to Hip Ratio 0.91 %    Triceps Skinfold 14 mm    % Body Fat 24.3 %    Grip Strength 42 kg    Flexibility --   Not performed due to post-surgical pain.   Single Leg Stand 30 seconds          Post Biometrics - 03/16/24 1048        Post  Biometrics   Waist Circumference 35.5 inches    Hip Circumference 41.5 inches    Waist to Hip Ratio 0.86 %    Triceps Skinfold 8 mm    Grip Strength 52 kg    Flexibility --   Not  performed   Single Leg Stand 30 seconds          Nutrition:  Nutrition Therapy & Goals - 02/24/24 1630       Nutrition Therapy   Diet Heart Healthy Diet    Drug/Food Interactions Statins/Certain Fruits      Personal Nutrition Goals   Nutrition Goal Patient to identify strategies for reducing cardiovascular risk by attending the [facility] education and nutrition series weekly.   goals in progress.   Personal Goal #2 Patient to improve diet quality by using the plate method as a guide for meal planning to include lean protein/plant protein, fruits, vegetables, whole grains, nonfat dairy as part of a well-balanced diet.   goals in progress.   Comments Goals in progress. 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 He continues to attend the [facility] education/nutrition series regularly.  He continues prednisone  at this time. He is  motivated to maintain weight between 170-180#; he is up 6.4# since starting with our program. Lipids are well controlled. Patient will benefit from participation in intensive cardiac rehab for nutrition, exercise, and lifestyle modification.      Intervention Plan   Intervention Prescribe, educate and counsel regarding individualized specific dietary modifications aiming towards targeted core components such as weight, hypertension, lipid management, diabetes, heart failure and other comorbidities.;Nutrition handout(s) given to patient.    Expected Outcomes Short Term Goal: Understand basic principles of dietary content, such as calories, fat, sodium, cholesterol and nutrients.;Long Term Goal: Adherence to prescribed nutrition plan.          Nutrition Discharge:  Nutrition Assessments - 03/16/24 1239       Rate Your Plate Scores   Pre Score 48    Post Score 52          Education Questionnaire Score:  Knowledge Questionnaire Score - 03/07/24 1701       Knowledge Questionnaire Score   Pre Score 23/24    Post Score 24/24          Goals reviewed with patient; copy given to patient.Pt graduates from  Intensive/Traditional cardiac rehab program on 03/21/24  with completion of  39 exercise and education sessions. Pt maintained good attendance and progressed nicely during their participation in rehab as evidenced by increased MET level. [name] increased his distance on his post exercise walk test by by 479 feet.   Medication list reconciled. Repeat  PHQ score- 8 .  Pt has made significant lifestyle changes and should be commended for their success. [name]  achieved his goals during cardiac rehab.   Pt plans to continue exercise at using free weights, participating in yoga and meditation walking outside when weather appropriate. [name] may consider purchasing a treadmill. We are proud of [name]'s progress!Hadassah Elpidio Quan RN BSN

## 2024-03-22 NOTE — Progress Notes (Unsigned)
 [name] T. [name] Eccleston, MD, CAQ Sports Medicine [facility] at [facility] [address] [place], [address]  Phone: [phone]  FAX: [phone]  [name] - 48 y.o. male  MRN [id number]  Date of Birth: October 18, 1975  Date: 03/24/2024  PCP: Randeen Laine LABOR, MD  Referral: Randeen Laine LABOR, MD  Chief Complaint  Patient presents with   Knee Pain    Bilateral but Left is worse   Hip Pain    Right   Subjective:   [name] is a 48 y.o. very pleasant male patient with Body mass index is 28.35 kg/m. who presents with the following:  Patient is here for new consultation with me today reguarding some ongoing left-sided knee pain.  He is a pleasant gentleman, and his history is significant for recent heart transplant at [facility].  He is also on Prograf and CellCept.  The transplant team is very cautious with him using additional medications.  He is having a lot of left greater than right knee pain, this is inhibiting his ability to do cardiac rehab well. He does not have any groin pain, or significant hip pain.  No major knee history. He does have a history of some intermittent chronic knee pain going on for an extended period of time  Posterior pelvis will hurt   2022 PROCEDURE:  Minimally invasive L4-5 laminectomy, right L5 vertebral body biopsy and vertebroplasty    Review of Systems is noted in the HPI, as appropriate  Objective:   BP 110/70   Pulse 74   Temp 97.7 F (36.5 C) (Temporal)   Ht 5' 9 (1.753 m)   Wt 192 lb (87.1 kg)   SpO2 99%   BMI 28.35 kg/m   GEN: No acute distress; alert,appropriate. PULM: Breathing comfortably in no respiratory distress PSYCH: Normally interactive.   Left knee: Full extension and flexion to 120 He does have a mild effusion Stable to varus and valgus stress Lachman and drawer testing is negative No significant pain at the tibial tubercle or tibial plateau Mild tenderness at the anserine bursa Mild  pain with loading the patellar facets Medial lateral joint line tenderness Forced flexion causes pain  Laboratory and Imaging Data:  Assessment and Plan:     ICD-10-CM   1. Chronic pain of left knee  M25.562 DG Knee 4 Views W/Patella Left   G89.29     2. Chronic right hip pain  M25.551    G89.29     3. History of heart transplant (HCC)  Z94.1      I think it is right to be cautious after heart transplant and being on multiple medications such as Prograf or CellCept.  He is going to contact the transplant team to see if they feel like an intra-articular injection of steroids would be reasonable.  At this point, he has done that, and he is going to see me next week for the injection.  Orders placed today for conditions managed today: Orders Placed This Encounter  Procedures   DG Knee 4 Views W/Patella Left    Disposition: No follow-ups on file.  Dragon [employer] One speech-to-text software was used for transcription in this dictation.  Possible transcriptional errors can occur using Animal nutritionist.   Signed,  [name]. Aadith Raudenbush, MD   Outpatient Encounter Medications as of 03/24/2024  Medication Sig   acetaminophen  (TYLENOL ) 500 MG tablet Take 500 mg by mouth 2 (two) times daily. 2 tablets twice a day  ASPIRIN LOW DOSE 81 MG tablet Take 81 mg by mouth daily.   Calcium  Citrate-Vitamin D  (CITRACAL + D PO) Take 1 tablet by mouth 2 (two) times daily.   fexofenadine (ALLEGRA) 180 MG tablet Take 180 mg by mouth daily.   Fingerstix Lancets MISC 1 each by Other route 2 (two) times daily.   mirtazapine (REMERON) 7.5 MG tablet Take 7.5 mg by mouth at bedtime.   montelukast  (SINGULAIR ) 10 MG tablet TAKE 1 TABLET BY MOUTH EVERYDAY AT BEDTIME   Multiple Vitamin (MULITIVITAMIN WITH MINERALS) TABS Take 1 tablet by mouth daily.   mycophenolate (CELLCEPT) 250 MG capsule Take 250 mg by mouth 2 (two) times daily. 6 capsules twice a day   pantoprazole (PROTONIX) 40 MG tablet Take 40 mg by  mouth daily.   polyethylene glycol (MIRALAX  / GLYCOLAX ) 17 g packet Take 17 g by mouth daily as needed for moderate constipation.   potassium chloride  SA (KLOR-CON  M) 20 MEQ tablet Take 20 mEq by mouth once.   PRECISION QID TEST test strip 1 strip by Other route in the morning and at bedtime.   prednisoLONE 5 MG TABS tablet Take 7.5 mg by mouth daily with breakfast.   propranolol (INDERAL) 10 MG tablet Take 10 mg by mouth 2 (two) times daily.   rosuvastatin (CRESTOR) 10 MG tablet Take 10 mg by mouth daily.   senna-docusate (SENOKOT-S) 8.6-50 MG tablet Take 1 tablet by mouth 2 (two) times daily.   sertraline  (ZOLOFT ) 100 MG tablet Take 100 mg by mouth daily.   sulfamethoxazole-trimethoprim (BACTRIM) 400-80 MG tablet Take 1 tablet by mouth daily.   tacrolimus (PROGRAF) 1 MG capsule Take 1 mg by mouth 2 (two) times daily. 3 tablets in the morning and 2 tablets at night   tadalafil (CIALIS) 5 MG tablet Take 5 mg by mouth daily as needed for erectile dysfunction.   traZODone  (DESYREL ) 50 MG tablet Take 25 mg by mouth at bedtime as needed.   [DISCONTINUED] predniSONE  (DELTASONE ) 5 MG tablet Take 15 mg by mouth daily with breakfast.   [DISCONTINUED] sertraline  (ZOLOFT ) 50 MG tablet Take 75 mg by mouth daily.   No facility-administered encounter medications on file as of 03/24/2024.

## 2024-03-23 ENCOUNTER — Other Ambulatory Visit: Payer: Self-pay | Admitting: *Deleted

## 2024-03-24 ENCOUNTER — Ambulatory Visit: Admitting: Family Medicine

## 2024-03-24 ENCOUNTER — Ambulatory Visit
Admission: RE | Admit: 2024-03-24 | Discharge: 2024-03-24 | Disposition: A | Discharge status: Home / Self Care | Source: Ambulatory Visit | Attending: Family Medicine | Admitting: Family Medicine

## 2024-03-24 ENCOUNTER — Encounter: Payer: Self-pay | Admitting: Family Medicine

## 2024-03-24 VITALS — BP 110/70 | HR 74 | Temp 97.7°F | Ht 69.0 in | Wt 192.0 lb

## 2024-03-24 DIAGNOSIS — G8929 Other chronic pain: Secondary | ICD-10-CM | POA: Diagnosis not present

## 2024-03-24 DIAGNOSIS — M25551 Pain in right hip: Secondary | ICD-10-CM

## 2024-03-24 DIAGNOSIS — M25562 Pain in left knee: Secondary | ICD-10-CM | POA: Diagnosis not present

## 2024-03-24 DIAGNOSIS — Z941 Heart transplant status: Secondary | ICD-10-CM | POA: Diagnosis not present

## 2024-03-24 NOTE — Patient Instructions (Signed)
 For L knee:  intraarticular injection with corticosteroid (Triamcinalone) and lidocaine 

## 2024-03-29 NOTE — Progress Notes (Signed)
 [name] T. [name] Aguon, MD, CAQ Sports Medicine [facility] at [facility] [address] [place], [address]  Phone: [phone]  FAX: [phone]  [name] - 48 y.o. male  MRN [id number]  Date of Birth: 10-26-1975  Date: 03/30/2024  PCP: Randeen Laine LABOR, MD  Referral: Randeen Laine LABOR, MD  Chief Complaint  Patient presents with   Knee Pain    Left Injection     This patient has a prior heart transplant.  Saw him last week and wanted to do a knee injection, he wanted to double check with his transplant team.  He now follows up for left-sided knee pain injection.    ICD-10-CM   1. Primary osteoarthritis of left knee  M17.12 triamcinolone  acetonide (KENALOG -40) injection 40 mg    2. Chronic pain of left knee  M25.562    G89.29      Aspiration/Injection Procedure Note [name] 06/08/1976 Date of procedure: 03/30/2024  Procedure: Large Joint Aspiration / Injection of Knee, L Indications: Pain  Procedure Details Patient verbally consented to procedure. Risks, benefits, and alternatives explained. Sterilely prepped with Chloraprep. Ethyl cholride used for anesthesia. 9 cc Lidocaine  1% mixed with 1 mL of Kenalog  40 mg injected using the anteromedial approach without difficulty. No complications with procedure and tolerated well. Patient had decreased pain post-injection. Medication: 1 mL of Kenalog  40 mg   Medication Management during today's office visit: Meds ordered this encounter  Medications   triamcinolone  acetonide (KENALOG -40) injection 40 mg     Dragon Medical One speech-to-text software was used for transcription in this dictation.  Possible transcriptional errors can occur using Animal nutritionist.   Signed,  [name]. Shawnmichael Parenteau, MD   Outpatient Encounter Medications as of 03/30/2024  Medication Sig   acetaminophen  (TYLENOL ) 500 MG tablet Take 500 mg by mouth 2 (two) times daily. 2 tablets twice a day   ASPIRIN LOW DOSE 81 MG  tablet Take 81 mg by mouth daily.   Calcium  Citrate-Vitamin D  (CITRACAL + D PO) Take 1 tablet by mouth 2 (two) times daily.   fexofenadine (ALLEGRA) 180 MG tablet Take 180 mg by mouth daily.   Fingerstix Lancets MISC 1 each by Other route 2 (two) times daily.   mirtazapine (REMERON) 7.5 MG tablet Take 7.5 mg by mouth at bedtime.   montelukast  (SINGULAIR ) 10 MG tablet TAKE 1 TABLET BY MOUTH EVERYDAY AT BEDTIME   Multiple Vitamin (MULITIVITAMIN WITH MINERALS) TABS Take 1 tablet by mouth daily.   mycophenolate (CELLCEPT) 250 MG capsule Take 250 mg by mouth 2 (two) times daily. 6 capsules twice a day   pantoprazole (PROTONIX) 40 MG tablet Take 40 mg by mouth daily.   polyethylene glycol (MIRALAX  / GLYCOLAX ) 17 g packet Take 17 g by mouth daily as needed for moderate constipation.   potassium chloride  SA (KLOR-CON  M) 20 MEQ tablet Take 20 mEq by mouth once.   PRECISION QID TEST test strip 1 strip by Other route in the morning and at bedtime.   prednisoLONE 5 MG TABS tablet Take 7.5 mg by mouth daily with breakfast.   propranolol (INDERAL) 10 MG tablet Take 10 mg by mouth 2 (two) times daily.   rosuvastatin (CRESTOR) 10 MG tablet Take 10 mg by mouth daily.   senna-docusate (SENOKOT-S) 8.6-50 MG tablet Take 1 tablet by mouth 2 (two) times daily.   sertraline  (ZOLOFT ) 100 MG tablet Take 100 mg by mouth daily.   sulfamethoxazole-trimethoprim (BACTRIM) 400-80 MG tablet  Take 1 tablet by mouth daily.   tacrolimus (PROGRAF) 1 MG capsule Take 1 mg by mouth 2 (two) times daily. 3 tablets in the morning and 2 tablets at night   tadalafil (CIALIS) 5 MG tablet Take 5 mg by mouth daily as needed for erectile dysfunction.   traZODone  (DESYREL ) 50 MG tablet Take 25 mg by mouth at bedtime as needed.   Facility-Administered Encounter Medications as of 03/30/2024  Medication   triamcinolone  acetonide (KENALOG -40) injection 40 mg

## 2024-03-30 ENCOUNTER — Ambulatory Visit: Admitting: Family Medicine

## 2024-03-30 ENCOUNTER — Encounter: Payer: Self-pay | Admitting: Family Medicine

## 2024-03-30 VITALS — BP 100/70 | HR 75 | Temp 97.4°F | Ht 69.0 in | Wt 192.0 lb

## 2024-03-30 DIAGNOSIS — G8929 Other chronic pain: Secondary | ICD-10-CM | POA: Diagnosis not present

## 2024-03-30 DIAGNOSIS — M1712 Unilateral primary osteoarthritis, left knee: Secondary | ICD-10-CM

## 2024-03-30 DIAGNOSIS — M25562 Pain in left knee: Secondary | ICD-10-CM | POA: Diagnosis not present

## 2024-03-30 MED ORDER — TRIAMCINOLONE ACETONIDE 40 MG/ML IJ SUSP
40.0000 mg | Freq: Once | INTRAMUSCULAR | Status: AC
  Administered 2024-03-30 (×2): 40 mg via INTRA_ARTICULAR

## 2024-04-01 ENCOUNTER — Ambulatory Visit: Payer: Self-pay | Admitting: Family Medicine

## 2024-04-04 NOTE — Progress Notes (Signed)
 CARDIAC PSYCHOLOGY SERVICE   [facility]     CONFIDENTIAL VIRTUAL PSYCHOLOGICAL TREATMENT PROGRESS NOTE     NAME:  [name], [name]   DOB:     [birth year]   MRN:    [id number]    DOS:     03/31/2024     Referral Information   Mr. Hayter is a 48 year old married White male with PMH of [facility]) s/p heart transplant, VT s/p single chamber ICD with significant shock history, epicardial/endocardial ablation (2010, 06/2023) and endocardial ablation (2013, 05/2023), PVCs, superficial venous thrombosis (06/2023), OSA, asthma, fatty liver disease, osteoporosis, and L5 spinal fracture (04/2021). He was referred to our service by Dr. Rosine for concerns of PTSD following multiple instances of ICD shock.    He was initially evaluated by our service on 07/30/2023 and presented with significant worry regarding the future and PTSD symptoms. He endorsed current hypervigilance, catastrophic thinking, and avoidance of some activities due to associations with past shock and fear of causing another shock. He endorsed intrusive memories of past shocks, nightmares, avoidance of triggers, fear of his device failing him, persistent worry of death, and negative alterations in mood.    Maeve Sargeant, BS, a doctoral candidate in clinical health psychology, provided 35 minutes of video and telephone-based service via WebEx under my supervision. There is NO CHARGE for this visit, as the service was provided by a trainee. Prior to the start of the telephone-based session, Mr. Settlemire assured the clinician that he was in a private space, free of possible intrusions.     TODAY'S SESSION    Mr. Minshall arrived on time to session and was pleasant and open throughout, speaking to his experiences and providing relevant examples. Mr. Blanke mood was euthymic with affect congruent. He was an excellent historian, easily speaking about specifics related to his experiences. Speech was normal rate, rhythm, and volume. He displayed  appropriate responsiveness and initiation with no indication of a thought disorder. Rapport was easily established and maintained throughout the session.   Mr. Offord described experiencing mixed feelings following a discussion with his transplant team earlier that morning regarding medication changes. On one hand, Mr. Trimm reported feeling that he had finally just got [his medications] all set, while on the other hand, he expressed significant annoyance about persistent medication-induced tremors. Mr. Bourque and student-clinician collaboratively discussed acceptance of the unknown outcome of medication changes, with a focus on the potential for positive change. He reported concern about continued memory difficulties (e.g., word finding difficulties, forgetting why he entered a room), which he reported occur multiple times per hour. He noted these difficulties appear more frequent during times of stress.   Psychoeducation was provided regarding the negative impact of intense emotions (e.g., stress) on memory encoding and retrieval. Mr. Meyerhoff and student-clinician collaboratively discussed strategies to reduce feelings of self-blame, future-oriented worry, and general stress when memory lapses occur (e.g., taking deep breathes, engaging in purposeful distraction until the memory returns). Mr. Biehl also reported feelings of acceptance about the possibility of returning to work versus inability to return to work, for the first time since transplant. Mr. Anthes and student-clinician collaboratively discussed what has allowed him to feel closer to this acceptance for the first time, with a focus on aspects of his identity beyond his occupational role (e.g., as a father, as a husband). 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 Mr. Lengacher and student-clinician also engaged in a cognitive restructuring exercise addressing the idea of providing for [his family], particularly emphasizing how he provides increased connection with his family, more quality time, and development of new skills with his daughters.    Acceptance and Commitment Therapy, Cognitive Behavioral Therapy, and supportive counseling frameworks were primarily utilized, with interventions including psychoeducation, validation and normalization, exploration of feelings, values-based exploration of identity outside of an occupational role, and cognitive restructuring around beliefs related to providing for family. Next session is scheduled for 04/20/2024 @ 4pm and will include follow-up on continued assessment of stress and future-oriented anxiety, discussion of acceptance and coping with ongoing physical and occupational adjustments, and introduction of a gratitude/pride reflection exercise to enhance present-moment awareness and self-affirmation.   RECOMMENDATIONS   1. FOLLOW-UP WITH CARDIAC PSYCHOLOGY: Mr. Fandino will follow-up in our pro-bono clinic to further assess and develop skills related to anxiety management and adjustment to medical and occupational changes. He will follow up in our clinic in five weeks.     2. CONTINUE USE OF COPING STRATEGIES FOR MEMORY RELATED STRESS: Mr. Krejci is encouraged to continue engaging in existing coping strategies (e.g., 5-senses mindfulness activity, walking, video games, breathing techniques) during moments of acute stress or mental overwhelm, particularly following an experience of memory lapse. These strategies may help reduce the emotional exacerbation of physical symptoms (e.g., tremors) and memory difficulties (e.g., word-finding difficulties and forgetting the purpose of entering a room), and can be tailored over time to best support his needs.   3. VALUE-BASED REFLECTION AND RESTRUCTURING: Mr. Schirtzinger is encouraged  to reflect on personal values outside of his occupational identity (e.g., father, husband) and to continue reframing providing for family as including emotional support, quality time, and connection. Identifying and affirming these contributions may strengthen self-acceptance and reduce future-oriented worry/pressure related to occupational roles.    Thank you for the opportunity to participate in the care of Mr. Lengyel.     Jayson FALCON. Shana, Ph.D., ABPP   Professor and Environmental manager in Clinical Health Psychology   [facility]

## 2024-04-11 ENCOUNTER — Ambulatory Visit (INDEPENDENT_AMBULATORY_CARE_PROVIDER_SITE_OTHER)
Admission: RE | Admit: 2024-04-11 | Discharge: 2024-04-11 | Disposition: A | Discharge status: Home / Self Care | Source: Ambulatory Visit | Attending: Family Medicine | Admitting: Family Medicine

## 2024-04-11 ENCOUNTER — Ambulatory Visit: Payer: Self-pay | Admitting: Family Medicine

## 2024-04-11 ENCOUNTER — Encounter: Payer: Self-pay | Admitting: Family Medicine

## 2024-04-11 ENCOUNTER — Ambulatory Visit: Admitting: Family Medicine

## 2024-04-11 VITALS — BP 128/70 | HR 74 | Temp 98.0°F | Ht 69.0 in | Wt 191.2 lb

## 2024-04-11 DIAGNOSIS — S99921A Unspecified injury of right foot, initial encounter: Secondary | ICD-10-CM | POA: Diagnosis not present

## 2024-04-11 DIAGNOSIS — S99929A Unspecified injury of unspecified foot, initial encounter: Secondary | ICD-10-CM | POA: Insufficient documentation

## 2024-04-11 MED ORDER — CEPHALEXIN 500 MG PO CAPS: 500.0000 mg | ORAL_CAPSULE | Freq: Three times a day (TID) | ORAL | 0 refills | Status: DC

## 2024-04-11 NOTE — Patient Instructions (Addendum)
 Keep toe clean with soap and water  Band aid and antibiotic ointment  You can wrap with fluffy guaze for protection and comfort as well   Elevate when able   Xray today  We will reach out with results when it gets read   Watch for redness/ increased pain or any drainage that is new  Let us  know   Take keflex  as directed If you cannot have this medicine let us  know

## 2024-04-11 NOTE — Assessment & Plan Note (Signed)
 2nd great toe  Happened last night when moving a chair  Total avulsion /loss of nail  Reassuring exam  Pain /swelling and some bruising  No fracture on xray   Encouraged soap and water cleanse  Dress with non stick pad or band aid and antibiotic ointment  Elevate   Antic guidance- re: healing and expectations for nail return   Watch closely for signs and symptoms of infection  In light of immunocomp status will cover with keflex    Update if not starting to improve in a week or if worsening

## 2024-04-11 NOTE — Progress Notes (Signed)
 Subjective:    Patient ID: [name], male    DOB: 07-25-1976, 48 y.o.   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  Cellcept Prednisolone  Bactrim Tacrolimus   Overall doing pretty well  Feels better Dealing with the medicines is tough   Taking care of himself  Not sure if he will return to work    Foot xray-neg for fracture today   Patient Active Problem List   Diagnosis Date Noted   Toe injury 04/11/2024   Influenza 09/30/2023   Tremor 09/01/2023   Gait instability 09/01/2023   Arm swelling 08/13/2023   Superficial venous thrombosis of left upper extremity 07/20/2023   Left forearm pain 07/14/2023   PVC's (premature ventricular contractions) 06/17/2023   Acute pain of right wrist 01/26/2023   Scaphoid fracture of wrist 01/26/2023   Seborrheic keratoses 09/01/2022   Stucco keratoses 09/01/2022   Angioma of skin 09/01/2022   Lentigo 09/01/2022   Fever and chills 09/01/2022   History of hypokalemia 06/03/2022   Generalized abdominal pain 06/03/2022   Right elbow pain 05/02/2022   Fatigue 05/02/2022   Serum calcium  elevated 05/02/2022   Paresthesia of left arm 01/23/2022   Localized osteoporosis with current pathological fracture 10/24/2021   Osteopenia 06/18/2021   Genital warts 06/03/2021   Condyloma 06/03/2021   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   Other skin changes due to chronic exposure to nonionizing radiation 06/03/2021   Lumbar radiculopathy 05/14/2021   Stress reaction 03/07/2021   Arrhythmogenic right  ventricular cardiomyopathy (HCC) 09/14/2019   Kidney stone 06/13/2019   BPH (benign prostatic hyperplasia) 07/27/2018   Elevated glucose level 04/06/2017   GAD (generalized anxiety disorder) 08/16/2016   Routine general medical examination at a health care facility 03/28/2016   Hypertriglyceridemia 02/21/2014   Low HDL (under 40) 02/21/2014   Vitamin D  deficiency 02/21/2014   Allergic rhinitis 02/21/2014   Erectile dysfunction 08/19/2013   OSA (obstructive sleep apnea) 10/30/2011   Environmental allergies 10/27/2011   High risk medications (not anticoagulants) long-term use 10/27/2011   Hypokalemia 09/29/2011   Arrhythmogenic RV cardiomyopathy 03/04/2011   Implantable cardioverter-defibrillator (ICD) in situ 01/10/2011   PTSD (post-traumatic stress disorder) 2/2 ICD shocks 01/10/2011   Fatty liver 01/04/2009   GERD 12/05/2008   History of ventricular tachycardia 11/03/2008   ECZEMA 08/17/2007   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   Asthma when younger   Fatty liver    History of chicken pox    History of kidney stones    MDT DVFB1D1 Visia AF MRI VR  ICD (implantable cardiac defibrillator) in place    Osteopenia    Pancreatitis    S/P orthotopic heart transplant (HCC)  At [facility]   Sleep apnea    uses cpap   Ventricular tachycardia (HCC)    sotalol  therapy;  catheter ablation at [facility] 10/10 and [facility] 2013   Past Surgical History:  Procedure Laterality Date   CARDIAC CATHETERIZATION  06/01/2006   CARDIAC DEFIBRILLATOR PLACEMENT  06/03/2006   [employer]   CHOLECYSTECTOMY N/A 08/01/2021   Procedure: LAPAROSCOPIC CHOLECYSTECTOMY WITH INTRAOPERATIVE CHOLANGIOGRAM;  Surgeon: Vanderbilt Ned, MD;  Location: [facility] ORS;  Service: General;  Laterality: N/A;   HAND SURGERY Left    ICD GENERATOR CHANGEOUT N/A  11/04/2019   Procedure: ICD GENERATOR CHANGEOUT;  Surgeon: Fernande Elspeth BROCKS, MD;  Location: [facility] INVASIVE CV LAB;  Service: Cardiovascular;  Laterality: N/A;   IMPLANTABLE CARDIOVERTER DEFIBRILLATOR GENERATOR CHANGE N/A 12/11/2011   Procedure: IMPLANTABLE CARDIOVERTER DEFIBRILLATOR GENERATOR CHANGE;  Surgeon: Elspeth BROCKS Fernande, MD;  Location: [facility] CATH LAB;  Service: Cardiovascular;  Laterality: N/A;   KYPHOPLASTY N/A 05/14/2021   Procedure: Lumbar Five Vertebroplasty;  Surgeon: Cheryle Ned LABOR, MD;  Location: [facility] OR;  Service: Neurosurgery;  Laterality: N/A;   LUMBAR LAMINECTOMY/ DECOMPRESSION WITH MET-RX N/A 05/14/2021   Procedure: Lumbar Four-Five Minimally Invasive Laminectomy;  Surgeon: Cheryle Ned LABOR, MD;  Location: [facility] OR;  Service: Neurosurgery;  Laterality: N/A;  3C/RM 19   TONSILLECTOMY     vt ablation  10/10, 10/13   x 2   WISDOM TOOTH EXTRACTION     Social History   Tobacco Use   Smoking status: Former    Current packs/day: 0.00    Average packs/day: 1 pack/day for 18.0 years (18.0 ttl pk-yrs)    Types: Cigarettes    Start date: 01/17/1990    Quit date: 08/19/2007    Years since quitting: 16.6   Smokeless tobacco: Never  Vaping Use   Vaping status: Never Used  Substance Use Topics   Alcohol use: No    Alcohol/week: 0.0 standard drinks of alcohol   Drug use: No   Family History  Problem Relation Age of Onset   Sudden death Father    Asthma Mother    Diabetes Other    Stroke Other    Heart attack Other    Prostate cancer Other    Breast cancer Other    Ovarian cancer Other    Uterine cancer Other    Colon cancer Other    Drug abuse Other    Depression Other    Lung cancer Maternal Grandfather    Heart disease Maternal Grandfather    Heart disease Paternal Grandfather    Hypertension Paternal Grandmother    Allergies  Allergen Reactions   Bee Venom Anaphylaxis   Magnesium  Oxide Diarrhea   Vancomycin Itching and Other (See Comments)    Tolerated well after  giving benadryl  and doubling the infusion time   Current Outpatient Medications on File Prior to Visit  Medication Sig Dispense Refill   acetaminophen  (TYLENOL ) 500 MG tablet Take 500 mg by mouth 2 (two) times daily. 2 tablets twice a day     ASPIRIN LOW DOSE 81 MG tablet Take 81 mg by mouth daily.     Calcium  Citrate-Vitamin D  (CITRACAL + D PO) Take 1 tablet by mouth 2 (two) times daily.     fexofenadine (ALLEGRA) 180 MG tablet Take 180 mg by mouth daily.     Fingerstix Lancets MISC 1 each by Other route 2 (two) times daily.     mirtazapine (REMERON) 7.5 MG tablet Take 7.5 mg by mouth at bedtime.  montelukast  (SINGULAIR ) 10 MG tablet TAKE 1 TABLET BY MOUTH EVERYDAY AT BEDTIME 90 tablet 2   Multiple Vitamin (MULITIVITAMIN WITH MINERALS) TABS Take 1 tablet by mouth daily.     mycophenolate (CELLCEPT) 250 MG capsule Take 250 mg by mouth 2 (two) times daily. 6 capsules twice a day     pantoprazole (PROTONIX) 40 MG tablet Take 40 mg by mouth daily.     polyethylene glycol (MIRALAX  / GLYCOLAX ) 17 g packet Take 17 g by mouth daily as needed for moderate constipation.     potassium chloride  SA (KLOR-CON  M) 20 MEQ tablet Take 20 mEq by mouth once.     PRECISION QID TEST test strip 1 strip by Other route in the morning and at bedtime.     prednisoLONE 5 MG TABS tablet Take 7.5 mg by mouth daily with breakfast.     propranolol (INDERAL) 10 MG tablet Take 10 mg by mouth 2 (two) times daily.     rosuvastatin (CRESTOR) 10 MG tablet Take 10 mg by mouth daily.     senna-docusate (SENOKOT-S) 8.6-50 MG tablet Take 1 tablet by mouth 2 (two) times daily.     sertraline  (ZOLOFT ) 100 MG tablet Take 100 mg by mouth daily.     sulfamethoxazole-trimethoprim (BACTRIM) 400-80 MG tablet Take 1 tablet by mouth daily.     tacrolimus (PROGRAF) 1 MG capsule Take 1 mg by mouth 2 (two) times daily. 3 tablets in the morning and 2 tablets at night     tadalafil (CIALIS) 5 MG tablet Take 5 mg by mouth daily as needed for  erectile dysfunction.     traZODone  (DESYREL ) 50 MG tablet Take 25 mg by mouth at bedtime as needed.     No current facility-administered medications on file prior to visit.    Review of Systems  Constitutional:  Negative for activity change, appetite change, fatigue, fever and unexpected weight change.  HENT:  Negative for congestion, rhinorrhea, sore throat and trouble swallowing.   Eyes:  Negative for pain, redness, itching and visual disturbance.  Respiratory:  Negative for cough, chest tightness, shortness of breath and wheezing.   Cardiovascular:  Negative for chest pain and palpitations.  Gastrointestinal:  Negative for abdominal pain, blood in stool, constipation, diarrhea and nausea.  Endocrine: Negative for cold intolerance, heat intolerance, polydipsia and polyuria.  Genitourinary:  Negative for difficulty urinating, dysuria, frequency and urgency.  Musculoskeletal:  Negative for joint swelling and myalgias.       Toe injury/ pain   Skin:  Negative for pallor and rash.  Neurological:  Negative for dizziness, tremors, weakness, numbness and headaches.  Hematological:  Negative for adenopathy. Does not bruise/bleed easily.  Psychiatric/Behavioral:  Negative for decreased concentration and dysphoric mood. The patient is not nervous/anxious.        Objective:   Physical Exam Constitutional:      General: He is not in acute distress.    Appearance: Normal appearance. He is normal weight. He is not ill-appearing or diaphoretic.  Cardiovascular:     Rate and Rhythm: Normal rate and regular rhythm.  Pulmonary:     Effort: No respiratory distress.  Musculoskeletal:     Comments: Left 2nd toe nail  Distal swelling mild ecchymosis and loss of entire nail  Able to flex toe with discomfort   Skin:    Findings: Bruising and erythema present. No rash.     Comments: Loss of entire 2nd toe nail  No laceration noted  Tissue is wet and  fresh appearing without obvious laceration   Mildly tender     Neurological:     Mental Status: He is alert.     Sensory: No sensory deficit.  Psychiatric:        Mood and Affect: Mood normal.           Assessment & Plan:   Problem List Items Addressed This Visit       Other   Toe injury - Primary   2nd great toe  Happened last night when moving a chair  Total avulsion /loss of nail  Reassuring exam  Pain /swelling and some bruising  No fracture on xray   Encouraged soap and water cleanse  Dress with non stick pad or band aid and antibiotic ointment  Elevate   Antic guidance- re: healing and expectations for nail return   Watch closely for signs and symptoms of infection  In light of immunocomp status will cover with keflex    Update if not starting to improve in a week or if worsening        Relevant Orders   DG Foot Complete Right (Completed)

## 2024-04-12 ENCOUNTER — Ambulatory Visit

## 2024-04-12 ENCOUNTER — Ambulatory Visit (INDEPENDENT_AMBULATORY_CARE_PROVIDER_SITE_OTHER)

## 2024-04-12 DIAGNOSIS — Z23 Encounter for immunization: Secondary | ICD-10-CM | POA: Diagnosis not present

## 2024-04-12 DIAGNOSIS — S99921A Unspecified injury of right foot, initial encounter: Secondary | ICD-10-CM

## 2024-04-12 NOTE — Progress Notes (Signed)
 Per orders of Dr. Laine Balls, injection of Td given by Bobbette Sprague in left deltoid. Patient tolerated injection well.  Given due to R toe injury (see 04/11/24 OV notes and 04/11/24 Results F/u note).

## 2024-06-24 ENCOUNTER — Encounter: Payer: Self-pay | Admitting: Adult Health

## 2024-06-24 ENCOUNTER — Ambulatory Visit (INDEPENDENT_AMBULATORY_CARE_PROVIDER_SITE_OTHER): Admitting: Adult Health

## 2024-06-24 VITALS — BP 128/66 | HR 80 | Ht 69.0 in | Wt 196.4 lb

## 2024-06-24 DIAGNOSIS — G4733 Obstructive sleep apnea (adult) (pediatric): Secondary | ICD-10-CM

## 2024-06-24 DIAGNOSIS — Z87891 Personal history of nicotine dependence: Secondary | ICD-10-CM

## 2024-06-24 DIAGNOSIS — R5383 Other fatigue: Secondary | ICD-10-CM

## 2024-06-24 DIAGNOSIS — G4700 Insomnia, unspecified: Secondary | ICD-10-CM

## 2024-06-24 DIAGNOSIS — Z941 Heart transplant status: Secondary | ICD-10-CM

## 2024-06-24 DIAGNOSIS — G4709 Other insomnia: Secondary | ICD-10-CM

## 2024-06-24 DIAGNOSIS — I429 Cardiomyopathy, unspecified: Secondary | ICD-10-CM

## 2024-06-24 NOTE — Patient Instructions (Signed)
 Change CPAP pressure to Auto CPAP 7-15cmH2o.  Wear CPAP at bedtime all night long  Work on healthy weight loss  Do not drive if sleepy Follow up in 2 months and As needed

## 2024-06-24 NOTE — Progress Notes (Addendum)
 "  @Patient  ID: [name], male    DOB: 1976-01-18, 48 y.o.   MRN: [id number]  Chief Complaint  Patient presents with   Obstructive Sleep Apnea    Referring provider: Randeen Laine LABOR, MD  HPI: 48 yo male followed for obstructive sleep apnea Medical history significant for ischemic cardiomyopathy s/p Heart Transplant March 2025-[facility]     TEST/EVENTS : Reviewed 06/24/2024  PSG 12/03/11 >> AHI 22, SpO2 low 89%   Discussed the use of AI scribe software for clinical note transcription with the patient, who gave verbal consent to proceed.  History of Present Illness [name] is a 48 year old male with a history of heart transplant and sleep apnea who presents for a checkup regarding his CPAP therapy. Last seen in the office June 2023.   He is experiencing issues with his CPAP therapy, with increased noise and smaller breaths during sleep as noted by his wife. He has been using CPAP since being diagnosed with moderate sleep apnea in 2013. His current CPAP machine is relatively new, and he uses [employer] for his equipment. He reports that his CPAP device displays daily event numbers often exceeding ten, and sometimes as high as fifteen or sixteen. CPAP download shows 100% compliance with daily usage at 8hr. On CPAP 7 cmH2o. AHI 14.4/hr.   He underwent a heart transplant in March 2025 at [facility], following a diagnosis of arrhythmogenic right ventricular cardiomyopathy (ARVC), Post-transplant, he was hospitalized in May 2025 due to acute rejection, which has since been managed with medication. He reports feeling better post-transplant, with improved breathing and energy levels, although he experiences fatigue.  He is currently on Immunosuppressants medications including Cellcept, Prograf, and prednisone  5 mg.  He has been taking Remeron for about a year to aid with sleep, and occasionally uses trazodone  as needed for insomnia. Without Remeron, he wakes up frequently at night. He also mentions  experiencing tremors and other side effects from his medications, which he finds challenging.  His past medical history includes ARVC, which began causing symptoms about 20 years ago, including fainting episodes in his early adulthood. He is currently on disability but wants to return to work, though he is uncertain due to the demands of managing his transplant and medication regimen.     Allergies  Allergen Reactions   Bee Venom Anaphylaxis   Magnesium  Oxide Diarrhea   Vancomycin Itching and Other (See Comments)    Tolerated well after giving benadryl  and doubling the infusion time    Immunization History  Administered Date(s) Administered   HPV 9-valent 01/02/2021, 03/05/2021, 07/09/2021   Hepatitis A, Adult 06/02/2018, 12/28/2018   Influenza Whole 04/19/2011   Influenza, Seasonal, Injecte, Preservative Fre 05/20/2023   Influenza,inj,Quad PF,6+ Mos 05/18/2016, 05/09/2020, 05/02/2022   Influenza-Unspecified 05/18/2013, 04/18/2018, 04/21/2019, 05/18/2021   PFIZER(Purple Top)SARS-COV-2 Vaccination 08/09/2019, 08/30/2019, 05/30/2020   Pfizer Covid-19 Vaccine Bivalent Booster 32yrs & up 08/25/2021   Pfizer(Comirnaty)Fall Seasonal Vaccine 12 years and older 05/14/2024   Td 04/12/2024   Tdap 08/20/2011    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   Asthma when younger   Fatty liver    History of chicken pox    History of kidney stones    MDT DVFB1D1 Visia AF MRI VR  ICD (implantable cardiac defibrillator) in place  Osteopenia    Pancreatitis    S/P orthotopic heart transplant (HCC)    At [facility]   Sleep apnea    uses cpap   Ventricular tachycardia (HCC)    sotalol  therapy;  catheter ablation at [facility] 10/10 and [facility] 2013    Tobacco History: Social History   Tobacco Use  Smoking Status Former   Current  packs/day: 0.00   Average packs/day: 1 pack/day for 18.0 years (18.0 ttl pk-yrs)   Types: Cigarettes   Start date: 01/17/1990   Quit date: 08/19/2007   Years since quitting: 16.8  Smokeless Tobacco Never   Counseling given: Not Answered   Outpatient Medications Prior to Visit  Medication Sig Dispense Refill   acetaminophen  (TYLENOL ) 500 MG tablet Take 500 mg by mouth 2 (two) times daily. 2 tablets twice a day (Patient taking differently: Take 500 mg by mouth every 6 (six) hours as needed. 2 tablets twice a day)     ASPIRIN LOW DOSE 81 MG tablet Take 81 mg by mouth daily.     Calcium  Citrate-Vitamin D  (CITRACAL + D PO) Take 1 tablet by mouth 2 (two) times daily.     fexofenadine (ALLEGRA) 180 MG tablet Take 180 mg by mouth daily.     Fingerstix Lancets MISC 1 each by Other route 2 (two) times daily.     mirtazapine (REMERON) 7.5 MG tablet Take 7.5 mg by mouth at bedtime.     montelukast  (SINGULAIR ) 10 MG tablet TAKE 1 TABLET BY MOUTH EVERYDAY AT BEDTIME 90 tablet 2   Multiple Vitamin (MULITIVITAMIN WITH MINERALS) TABS Take 1 tablet by mouth daily.     mycophenolate (CELLCEPT) 250 MG capsule Take 250 mg by mouth 2 (two) times daily. 6 capsules twice a day (Patient taking differently: Take 1,500 mg by mouth 2 (two) times daily. 6 capsules twice a day)     pantoprazole (PROTONIX) 40 MG tablet Take 40 mg by mouth daily.     polyethylene glycol (MIRALAX  / GLYCOLAX ) 17 g packet Take 17 g by mouth daily as needed for moderate constipation.     potassium chloride  SA (KLOR-CON  M) 20 MEQ tablet Take 20 mEq by mouth once.     PRECISION QID TEST test strip 1 strip by Other route in the morning and at bedtime.     prednisoLONE 5 MG TABS tablet Take 7.5 mg by mouth daily with breakfast. (Patient taking differently: Take 5 mg by mouth daily with breakfast.)     propranolol (INDERAL) 10 MG tablet Take 10 mg by mouth 2 (two) times daily.     rosuvastatin (CRESTOR) 10 MG tablet Take 10 mg by mouth daily.      senna-docusate (SENOKOT-S) 8.6-50 MG tablet Take 1 tablet by mouth 2 (two) times daily.     sertraline  (ZOLOFT ) 100 MG tablet Take 100 mg by mouth daily.     sulfamethoxazole-trimethoprim (BACTRIM) 400-80 MG tablet Take 1 tablet by mouth daily.     tacrolimus (PROGRAF) 1 MG capsule Take 1 mg by mouth 2 (two) times daily. 3 tablets in the morning and 2 tablets at night (Patient taking differently: Take 3 mg by mouth daily. 3 tablets in the morning and 2 tablets at night)     tadalafil (CIALIS) 5 MG tablet Take 5 mg by mouth daily as needed for erectile dysfunction.     traZODone  (DESYREL ) 50 MG tablet Take 25 mg by mouth at bedtime as needed.     cephALEXin  (KEFLEX ) 500 MG capsule Take  1 capsule (500 mg total) by mouth 3 (three) times daily. (Patient not taking: Reported on 06/24/2024) 21 capsule 0   No facility-administered medications prior to visit.     Review of Systems:   Constitutional:   No  weight loss, night sweats,  Fevers, chills, +fatigue, or  lassitude.  HEENT:   No headaches,  Difficulty swallowing,  Tooth/dental problems, or  Sore throat,                No sneezing, itching, ear ache, nasal congestion, post nasal drip,   CV:  No chest pain,  Orthopnea, PND, swelling in lower extremities, anasarca, dizziness, palpitations, syncope.   GI  No heartburn, indigestion, abdominal pain, nausea, vomiting, diarrhea, change in bowel habits, loss of appetite, bloody stools.   Resp: No shortness of breath with exertion or at rest.  No excess mucus, no productive cough,  No non-productive cough,  No coughing up of blood.  No change in color of mucus.  No wheezing.  No chest wall deformity  Skin: no rash or lesions.  GU: no dysuria, change in color of urine, no urgency or frequency.  No flank pain, no hematuria   MS:  No joint pain or swelling.  No decreased range of motion.  No back pain.    Physical Exam  BP 128/66   Pulse 80   Ht 5' 9 (1.753 m) Comment: Per pt  Wt 196 lb 6.4  oz (89.1 kg)   SpO2 99% Comment: RA  BMI 29.00 kg/m   GEN: A/Ox3; pleasant , NAD, well nourished    HEENT:  [place]/AT,    NOSE-clear, THROAT-clear, no lesions, no postnasal drip or exudate noted.   NECK:  Supple w/ fair ROM; no JVD; normal carotid impulses w/o bruits; no thyromegaly or nodules palpated; no lymphadenopathy.    RESP  Clear  P & A; w/o, wheezes/ rales/ or rhonchi. no accessory muscle use, no dullness to percussion  CARD:  RRR, no m/r/g, no peripheral edema, pulses intact, no cyanosis or clubbing.  GI:   Soft & nt; nml bowel sounds; no organomegaly or masses detected.   Musco: Warm bil, no deformities or joint swelling noted.   Neuro: alert, no focal deficits noted.    Skin: Warm, no lesions or rashes    Lab Results:Reviewed 06/24/2024   CBC   BMET   BNP   ProBNP No results found for: PROBNP  Imaging: No results found.  Administration History     None           No data to display          No results found for: NITRICOXIDE     01/18/2019    9:24 AM  Results of the Epworth flowsheet  Sitting and reading 1  Watching TV 0  Sitting, inactive in a public place (e.g. a theatre or a meeting) 1  As a passenger in a car for an hour without a break 1  Lying down to rest in the afternoon when circumstances permit 1  Sitting and talking to someone 0  Sitting quietly after a lunch without alcohol 0  In a car, while stopped for a few minutes in traffic 0  Total score 4        Assessment & Plan:   Assessment and Plan Assessment & Plan Obstructive sleep apnea with residual events on CPAP despite excellent compliance.  Obstructive sleep apnea with ongoing residual events. Previous office visit with excellent control on CPAP  in June 2023 (pre-transplant). , as evidenced by increased apnea events and snoring reported by his spouse. Despite good CPAP usage, breakthrough events persist, possibly due to his extensive medication regimen post-heart  transplant. Will adjust CPAP pressures . He should monitor CPAP events and report if they remain high (13-14 or higher) after 3-4 weeks. A referral to a sleep lab for further evaluation will be considered if there is no improvement with pressure adjustments.  Status post orthotopic heart transplant   He is seven months post-orthotopic heart transplant, with a recent hospitalization in May for acute rejection, now resolved with medication. He is currently on Cellcept and Prograf, with no oxygen requirement.  Continue follow up with Transplant team.   Fatigue and insomnia in the context of post-transplant and sleep apnea   Fatigue and insomnia may be exacerbated by the post-transplant medication regimen and inadequate control of sleep apnea. He is currently on Remeron for sleep, with occasional use of trazodone  for insomnia. Fatigue may also be related to sleep apnea events. Monitor fatigue and sleep quality in conjunction with CPAP adjustments.        Hellen Shanley, NP 06/24/2024  I spent 32  minutes dedicated to the care of this patient on the date of this encounter to include pre-visit review of records, face-to-face time with the patient discussing conditions above, post visit ordering of testing, clinical documentation with the electronic health record, making appropriate referrals as documented, and communicating necessary findings to members of the patients care team.  "

## 2024-07-27 NOTE — Progress Notes (Unsigned)
 Tisa Weisel T. Rayya Yagi, MD, CAQ Sports Medicine [facility] at [facility] [address] [place], [address]  Phone: [phone]  FAX: [phone]  [name] - 48 y.o. male  MRN [id number]  Date of Birth: March 18, 1976  Date: 07/28/2024  PCP: Randeen Laine LABOR, MD  Referral: Randeen Laine LABOR, MD  Chief Complaint  Patient presents with   Arm Pain    Left   Numbness    Left Arm   Subjective:   [name] is a 48 y.o. very pleasant male patient with Body mass index is 29.61 kg/m. who presents with the following:  Discussed the use of AI scribe software for clinical note transcription with the patient, who gave verbal consent to proceed.  Patient presents the left side shoulder and arm pain.  History of Present Illness [name] is a 48 year old male with a history of heart transplant who presents with neck pain and radiating arm symptoms.  Current medications include Envarsus, CellCept.  He has been experiencing neck pain with radiating symptoms down his arm, which began a couple of days before Thanksgiving when he bent over quickly to catch his daughter, resulting in a brief shooting pain down his arm. Initially, the pain subsided but returned later that night after holding his daughter during dinner. The pain became more pronounced the following day, with shooting sensations and significant discomfort.  The pain persisted for about a week to a week and a half, with some improvement noted over the past few days. However, certain neck movements, particularly tilting his head to the left, trigger a 'shot of electricity' down his arm into his fingers, causing numbness and tingling. He also reports a slight weakness in his arm, making it harder to grip objects.  He has a history of degenerative disc disease and previously underwent back surgery.  After certain movements, he experiences tingling in his thumb and fingers. The tingling sometimes extends across  his arm. No significant weakness is reported, but there is slight difficulty in gripping objects.    Review of Systems is noted in the HPI, as appropriate  Objective:   BP 104/80   Pulse 77   Temp 98.1 F (36.7 C) (Temporal)   Ht 5' 9 (1.753 m)   Wt 200 lb 8 oz (90.9 kg)   SpO2 96%   BMI 29.61 kg/m   GEN: No acute distress; alert,appropriate. PULM: Breathing comfortably in no respiratory distress PSYCH: Normally interactive.    CERVICAL SPINE EXAM Range of motion: Flexion, extension, lateral bending, and rotation: Grossly 35% loss of expected motion Spurling's: Self Spurling's is positive Pain with terminal motion: Positive pain Spinous Processes: NT SCM: NT Upper paracervical muscles: Tender to palpation Upper traps: Tender to palpation C5-T1 intact, sensation and motor   Full range of motion at the shoulder with 5/5 strength, and no inducible impingement  Laboratory and Imaging Data:  Assessment and Plan:     ICD-10-CM   1. Left cervical radiculopathy  M54.12 DG Cervical Spine Complete    2. Acute pain of left shoulder  M25.512     3. Left arm pain  M79.602     4. High risk medications (not anticoagulants) long-term use  Z79.899     5. History of heart transplant (HCC)  Z94.1      Total encounter time: 30 minutes. This includes total time spent on the day of encounter.  Additional time spent on reviewing records including transplant  records and [facility] records. Assessment & Plan Left cervical radiculopathy Acute left cervical radiculopathy with radiating pain and tingling, likely herniated nucleus pulposus. No immediate surgical intervention needed. - Ordered neck x-ray to assess structural abnormalities. - Discuss potential neurosurgery referral if symptoms persist or worsen.  I think the fact that the patient has had a recent heart transplant impacts his plans for neck pain and radiculopathy.  Right now he and I both agree that we will avoid any kind  medication and he is going start doing some McKenzie protocol for his neck.  If he continues to have symptoms in 2 weeks, recommend formal physical therapy evaluation.  Medication Management during today's office visit: No orders of the defined types were placed in this encounter.  Medications Discontinued During This Encounter  Medication Reason   cephALEXin  (KEFLEX ) 500 MG capsule Completed Course   tacrolimus (PROGRAF) 1 MG capsule Change in therapy   tacrolimus (PROGRAF) 1 MG capsule Change in therapy    Orders placed today for conditions managed today: Orders Placed This Encounter  Procedures   DG Cervical Spine Complete    Disposition: No follow-ups on file.  [employer] speech-to-text software was used for transcription in this dictation.  Possible transcriptional errors can occur using Animal nutritionist.   Signed,  [name]. Jayquan Bradsher, MD   Outpatient Encounter Medications as of 07/28/2024  Medication Sig   acetaminophen  (TYLENOL ) 500 MG tablet Take 500 mg by mouth every 6 (six) hours as needed. 2 tablets twice a day   ASPIRIN LOW DOSE 81 MG tablet Take 81 mg by mouth daily.   Calcium  Citrate-Vitamin D  (CITRACAL + D PO) Take 1 tablet by mouth 2 (two) times daily.   ENVARSUS XR 1 MG TB24 Take 3 mg by mouth daily.   fexofenadine (ALLEGRA) 180 MG tablet Take 180 mg by mouth daily.   Fingerstix Lancets MISC 1 each by Other route 2 (two) times daily.   mirtazapine (REMERON) 7.5 MG tablet Take 7.5 mg by mouth at bedtime.   montelukast  (SINGULAIR ) 10 MG tablet TAKE 1 TABLET BY MOUTH EVERYDAY AT BEDTIME   Multiple Vitamin (MULITIVITAMIN WITH MINERALS) TABS Take 1 tablet by mouth daily.   mycophenolate (CELLCEPT) 250 MG capsule Take 250 mg by mouth 2 (two) times daily. 6 capsules twice a day   pantoprazole (PROTONIX) 40 MG tablet Take 40 mg by mouth daily.   polyethylene glycol (MIRALAX  / GLYCOLAX ) 17 g packet Take 17 g by mouth daily as needed for moderate constipation.    potassium chloride  SA (KLOR-CON  M) 20 MEQ tablet Take 20 mEq by mouth once.   PRECISION QID TEST test strip 1 strip by Other route in the morning and at bedtime.   prednisoLONE 5 MG TABS tablet Take 7.5 mg by mouth daily with breakfast.   propranolol (INDERAL) 10 MG tablet Take 10 mg by mouth 2 (two) times daily.   rosuvastatin (CRESTOR) 10 MG tablet Take 10 mg by mouth daily.   senna-docusate (SENOKOT-S) 8.6-50 MG tablet Take 1 tablet by mouth 2 (two) times daily.   sertraline  (ZOLOFT ) 100 MG tablet Take 100 mg by mouth daily.   sulfamethoxazole-trimethoprim (BACTRIM) 400-80 MG tablet Take 1 tablet by mouth daily.   tadalafil (CIALIS) 5 MG tablet Take 5 mg by mouth daily as needed for erectile dysfunction.   traZODone  (DESYREL ) 50 MG tablet Take 25 mg by mouth at bedtime as needed.   [DISCONTINUED] cephALEXin  (KEFLEX ) 500 MG capsule Take 1 capsule (500 mg total)  by mouth 3 (three) times daily. (Patient not taking: Reported on 06/24/2024)   [DISCONTINUED] tacrolimus (PROGRAF) 1 MG capsule Take 1 mg by mouth 2 (two) times daily. 3 tablets in the morning and 2 tablets at night (Patient taking differently: Take 3 mg by mouth daily. 3 tablets in the morning and 2 tablets at night)   No facility-administered encounter medications on file as of 07/28/2024.

## 2024-07-28 ENCOUNTER — Ambulatory Visit
Admission: RE | Admit: 2024-07-28 | Discharge: 2024-07-28 | Disposition: A | Discharge status: Home / Self Care | Source: Ambulatory Visit | Attending: Family Medicine | Admitting: Family Medicine

## 2024-07-28 ENCOUNTER — Encounter: Payer: Self-pay | Admitting: Family Medicine

## 2024-07-28 ENCOUNTER — Ambulatory Visit: Admitting: Family Medicine

## 2024-07-28 VITALS — BP 104/80 | HR 77 | Temp 98.1°F | Ht 69.0 in | Wt 200.5 lb

## 2024-07-28 DIAGNOSIS — Z941 Heart transplant status: Secondary | ICD-10-CM | POA: Diagnosis not present

## 2024-07-28 DIAGNOSIS — M5412 Radiculopathy, cervical region: Secondary | ICD-10-CM

## 2024-07-28 DIAGNOSIS — M25512 Pain in left shoulder: Secondary | ICD-10-CM | POA: Diagnosis not present

## 2024-07-28 DIAGNOSIS — M79602 Pain in left arm: Secondary | ICD-10-CM | POA: Diagnosis not present

## 2024-07-28 DIAGNOSIS — Z79899 Other long term (current) drug therapy: Secondary | ICD-10-CM | POA: Diagnosis not present

## 2024-07-29 DIAGNOSIS — Z941 Heart transplant status: Secondary | ICD-10-CM | POA: Insufficient documentation

## 2024-08-08 ENCOUNTER — Ambulatory Visit: Payer: Self-pay | Admitting: Family Medicine

## 2024-08-24 ENCOUNTER — Ambulatory Visit (INDEPENDENT_AMBULATORY_CARE_PROVIDER_SITE_OTHER): Payer: 59 | Admitting: Internal Medicine

## 2024-08-24 ENCOUNTER — Encounter: Payer: Self-pay | Admitting: Internal Medicine

## 2024-08-24 VITALS — BP 132/80 | HR 98 | Ht 69.0 in | Wt 203.0 lb

## 2024-08-24 DIAGNOSIS — M8080XS Other osteoporosis with current pathological fracture, unspecified site, sequela: Secondary | ICD-10-CM

## 2024-08-24 DIAGNOSIS — M8080XD Other osteoporosis with current pathological fracture, unspecified site, subsequent encounter for fracture with routine healing: Secondary | ICD-10-CM | POA: Diagnosis not present

## 2024-08-24 NOTE — Patient Instructions (Addendum)
" °  Continue MVI daily  Citracal/Vit D  1200 mg daily  Vitamin D  2000  international daily  "
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
## 2024-08-25 ENCOUNTER — Other Ambulatory Visit (HOSPITAL_COMMUNITY): Payer: Self-pay | Admitting: Internal Medicine

## 2024-08-25 ENCOUNTER — Ambulatory Visit (INDEPENDENT_AMBULATORY_CARE_PROVIDER_SITE_OTHER): Admitting: Adult Health

## 2024-08-25 ENCOUNTER — Encounter: Payer: Self-pay | Admitting: Adult Health

## 2024-08-25 VITALS — BP 124/70 | HR 77 | Temp 99.1°F | Ht 70.0 in | Wt 206.0 lb

## 2024-08-25 DIAGNOSIS — G4733 Obstructive sleep apnea (adult) (pediatric): Secondary | ICD-10-CM

## 2024-08-25 DIAGNOSIS — Z87891 Personal history of nicotine dependence: Secondary | ICD-10-CM

## 2024-08-25 NOTE — Patient Instructions (Addendum)
 Wear CPAP at bedtime all night long  Work on healthy weight loss  Do not drive if sleepy Follow up in 1 year with Dr. Olena or Nema Oatley NP

## 2024-08-25 NOTE — Progress Notes (Signed)
 "  @Patient  ID: [name], male    DOB: Sep 14, 1975, 49 y.o.   MRN: [id number]  Chief Complaint  Patient presents with   Obstructive Sleep Apnea    F/U    Referring provider: Randeen Laine LABOR, MD  HPI: 49 year old male followed for obstructive sleep apnea Medical history significant for ischemic cardiomyopathy status post heart transplant March 2025/[facility]    TEST/EVENTS : Reviewed 08/25/2024  Discussed the use of AI scribe software for clinical note transcription with the patient, who gave verbal consent to proceed.  History of Present Illness [name] is a 49 year old male with sleep apnea who presents for a follow-up after CPAP adjustments.  He is here for a follow-up visit after recent changes to his CPAP settings. While he does not feel 'exponentially different,' his wife is no longer waking up due to his sleep disturbances. The CPAP machine is making a whining noise, which he notices after the machine has been on for a while. This noise is not waking him up due to his use of earplugs, but he is concerned about the machine's performance.  The CPAP machine is less than five years old, and he recently replaced the tubing, but the whining noise persists.   Last visit patient was having increased residual sleep apneic events.  CPAP was adjusted to auto CPAP 7 to 15 cm H2O.  CPAP download shows excellent compliance with daily average usage at 8 hours.  AHI 2.4/hour.  Daily average pressure at 11.7 cm H2O.  He uses a modified full face mask that goes under the nose.    In terms of his recent health, he mentions having a viral illness over the holidays, which was not the flu or COVID-19, and it took a couple of weeks to resolve. His children, aged five and two, also had the illness but recovered more quickly than he did.     Allergies[1]  Immunization History  Administered Date(s) Administered   Dtap, Unspecified 05/25/1978, 07/27/1978, 10/02/1978, 09/21/1979,  02/21/1992   HPV 9-valent 01/02/2021, 03/05/2021, 07/09/2021   Hep B, Unspecified 02/21/1992, 03/27/1992, 10/15/1992   Hepatitis A, Adult 06/02/2018, 12/28/2018   Influenza Whole 04/19/2011   Influenza, Seasonal, Injecte, Preservative Fre 05/23/2010, 05/19/2011, 05/20/2023, 05/05/2024   Influenza,inj,Quad PF,6+ Mos 05/07/2012, 05/17/2013, 05/18/2016, 05/09/2020, 05/02/2022   Influenza-Unspecified 05/18/2013, 04/18/2018, 04/21/2019, 05/18/2021   MMR 07/01/1979, 02/21/1992   Moderna Covid-19 Fall Seasonal Vaccine 48yrs & older 07/24/2023   PFIZER(Purple Top)SARS-COV-2 Vaccination 08/09/2019, 08/30/2019, 05/30/2020   Pfizer Covid-19 Vaccine Bivalent Booster 87yrs & up 08/25/2021   Pfizer(Comirnaty)Fall Seasonal Vaccine 12 years and older 05/14/2024   Polio, Unspecified 05/25/1978, 07/27/1978, 09/21/1979, 02/21/1992   Td 04/12/2024   Tdap 08/20/2011   Typhoid Inactivated 06/07/2018    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   Asthma when younger   Fatty liver    History of kidney stones    MDT DVFB1D1 Visia AF MRI VR  ICD (implantable cardiac defibrillator) in place    Osteopenia    Pancreatitis    S/P orthotopic heart transplant (HCC)    At [facility]   Sleep apnea    uses cpap   Ventricular tachycardia (HCC)    sotalol  therapy;  catheter ablation at [facility] 10/10 and [facility] 2013    Tobacco History: Tobacco  Use History[2] Counseling given: Not Answered   Outpatient Medications Prior to Visit  Medication Sig Dispense Refill   acetaminophen  (TYLENOL ) 500 MG tablet Take 500 mg by mouth every 6 (six) hours as needed. 2 tablets twice a day     ASPIRIN LOW DOSE 81 MG tablet Take 81 mg by mouth daily.     Calcium  Citrate-Vitamin D  (CITRACAL + D PO) Take 1 tablet by mouth 2 (two) times daily.     ENVARSUS XR 1 MG TB24 Take 3  mg by mouth daily.     fexofenadine (ALLEGRA) 180 MG tablet Take 180 mg by mouth daily.     Fingerstix Lancets MISC 1 each by Other route 2 (two) times daily.     mirtazapine (REMERON) 7.5 MG tablet Take 7.5 mg by mouth at bedtime.     montelukast  (SINGULAIR ) 10 MG tablet TAKE 1 TABLET BY MOUTH EVERYDAY AT BEDTIME 90 tablet 2   Multiple Vitamin (MULITIVITAMIN WITH MINERALS) TABS Take 1 tablet by mouth daily.     mycophenolate (CELLCEPT) 250 MG capsule Take 250 mg by mouth 2 (two) times daily. 6 capsules twice a day (Patient taking differently: Take 6 capsules by mouth 2 (two) times daily. 6 capsules twice a day)     pantoprazole (PROTONIX) 40 MG tablet Take 40 mg by mouth daily.     polyethylene glycol (MIRALAX  / GLYCOLAX ) 17 g packet Take 17 g by mouth daily as needed for moderate constipation.     potassium chloride  SA (KLOR-CON  M) 20 MEQ tablet Take 20 mEq by mouth once.     PRECISION QID TEST test strip 1 strip by Other route in the morning and at bedtime.     predniSONE  (DELTASONE ) 5 MG tablet Take 5 mg by mouth daily.     propranolol (INDERAL) 10 MG tablet Take 10 mg by mouth 2 (two) times daily.     rosuvastatin (CRESTOR) 10 MG tablet Take 10 mg by mouth daily.     senna-docusate (SENOKOT-S) 8.6-50 MG tablet Take 1 tablet by mouth 2 (two) times daily.     sertraline  (ZOLOFT ) 100 MG tablet Take 100 mg by mouth daily.     sulfamethoxazole-trimethoprim (BACTRIM) 400-80 MG tablet Take 1 tablet by mouth daily.     tadalafil (CIALIS) 5 MG tablet Take 5 mg by mouth daily as needed for erectile dysfunction.     traZODone  (DESYREL ) 50 MG tablet Take 25 mg by mouth at bedtime as needed.     prednisoLONE 5 MG TABS tablet Take 7.5 mg by mouth daily with breakfast. (Patient not taking: Reported on 08/25/2024)     No facility-administered medications prior to visit.     Review of Systems:   Constitutional:   No  weight loss, night sweats,  Fevers, chills, fatigue, or  lassitude.  HEENT:   No  headaches,  Difficulty swallowing,  Tooth/dental problems, or  Sore throat,                No sneezing, itching, ear ache, nasal congestion, post nasal drip,   CV:  No chest pain,  Orthopnea, PND, swelling in lower extremities, anasarca, dizziness, palpitations, syncope.   GI  No heartburn, indigestion, abdominal pain, nausea, vomiting, diarrhea, change in bowel habits, loss of appetite, bloody stools.   Resp: No shortness of breath with exertion or at rest.  No excess mucus, no productive cough,  No non-productive cough,  No coughing up of blood.  No change in color of mucus.  No wheezing.  No chest wall deformity  Skin: no rash or lesions.  GU: no dysuria, change in color of urine, no urgency or frequency.  No flank pain, no hematuria   MS:  No joint pain or swelling.  No decreased range of motion.  No back pain.    Physical Exam  BP 124/70   Pulse 77   Temp 99.1 F (37.3 C)   Ht 5' 10 (1.778 m) Comment: Per pt  Wt 206 lb (93.4 kg)   SpO2 96% Comment: RA  BMI 29.56 kg/m   GEN: A/Ox3; pleasant , NAD, well nourished    HEENT:  [place]/AT,   NOSE-clear, THROAT-clear, no lesions, no postnasal drip or exudate noted.   NECK:  Supple w/ fair ROM; no JVD; normal carotid impulses w/o bruits; no thyromegaly or nodules palpated; no lymphadenopathy.    RESP  Clear  P & A; w/o, wheezes/ rales/ or rhonchi. no accessory muscle use, no dullness to percussion  CARD:  RRR, no m/r/g, no peripheral edema, pulses intact, no cyanosis or clubbing.  GI:   Soft & nt; nml bowel sounds; no organomegaly or masses detected.   Musco: Warm bil, no deformities or joint swelling noted.   Neuro: alert, no focal deficits noted.    Skin: Warm, no lesions or rashes    Lab Results:Reviewed 08/25/2024   CBC     BNP   ProBNP No results found for: PROBNP  Imaging:   Administration History     None           No data to display          No results found for: NITRICOXIDE      01/18/2019    9:24 AM  Results of the [facility] flowsheet  Sitting and reading 1  Watching TV 0  Sitting, inactive in a public place (e.g. a theatre or a meeting) 1  As a passenger in a car for an hour without a break 1  Lying down to rest in the afternoon when circumstances permit 1  Sitting and talking to someone 0  Sitting quietly after a lunch without alcohol 0  In a car, while stopped for a few minutes in traffic 0  Total score 4        Assessment & Plan:   Assessment and Plan Assessment & Plan Obstructive sleep apnea  -improved control on new CPAP setting. His obstructive sleep apnea is well-managed with CPAP therapy. Recent adjustments have reduced apnea events from 14-15 to 2 per hour.  Issues with loud whining noise from CPAP machine.  Advised to contact the CPAP provider to assess and potentially replace the machine due to noise issues. Ensure tubing is well-connected and consider placing a towel under the machine to reduce noise. Continue current CPAP settings as numbers are well-controlled. Maintain CPAP hygiene by changing water daily and cleaning the mask.  Plan  Patient Instructions  Wear CPAP at bedtime all night long  Work on healthy weight loss  Do not drive if sleepy Follow up in 1 year with Dr. Olena or Lio Wehrly NP           Madelin Stank, NP 08/25/2024  I spent   minutes dedicated to the care of this patient on the date of this encounter to include pre-visit review of records, face-to-face time with the patient discussing conditions above, post visit ordering of testing, clinical documentation with the electronic health record, making appropriate referrals as documented, and communicating necessary findings  to members of the patients care team.      [1]  Allergies Allergen Reactions   Bee Venom Anaphylaxis   Magnesium  Oxide Diarrhea   Vancomycin Itching and Other (See Comments)    Tolerated well after giving benadryl  and doubling the infusion time   [2]  Social History Tobacco Use  Smoking Status Former   Current packs/day: 0.00   Average packs/day: 1 pack/day for 18.0 years (18.0 ttl pk-yrs)   Types: Cigarettes   Start date: 01/17/1990   Quit date: 08/19/2007   Years since quitting: 17.0  Smokeless Tobacco Never   "

## 2024-08-26 ENCOUNTER — Telehealth: Payer: Self-pay | Admitting: Pharmacy Technician

## 2024-08-26 NOTE — Telephone Encounter (Signed)
 Auth Submission: NO AUTH NEEDED Site of care: Site of care: [facility] Payer: [facility] [employer] Medication & CPT/J Code(s) submitted: Reclast  (Zolendronic acid) J3489 Diagnosis Code: M81.0 Route of submission (phone, fax, portal): n/a Phone # Fax # Auth type: Buy/Bill PB Units/visits requested: 1 Reference number: n/a Approval from: 01/09 to 08/17/25

## 2024-08-27 ENCOUNTER — Other Ambulatory Visit: Payer: Self-pay | Admitting: Family Medicine

## 2024-08-29 NOTE — Telephone Encounter (Signed)
 Looks like pt has had only recent acute appts., no recent f/u or CPE and no future appts., please advise

## 2024-09-08 ENCOUNTER — Ambulatory Visit

## 2024-09-08 VITALS — BP 120/73 | HR 78 | Temp 98.8°F | Resp 16 | Ht 70.0 in | Wt 206.6 lb

## 2024-09-08 DIAGNOSIS — M8080XS Other osteoporosis with current pathological fracture, unspecified site, sequela: Secondary | ICD-10-CM | POA: Diagnosis not present

## 2024-09-08 DIAGNOSIS — M8580 Other specified disorders of bone density and structure, unspecified site: Secondary | ICD-10-CM

## 2024-09-08 DIAGNOSIS — Z79899 Other long term (current) drug therapy: Secondary | ICD-10-CM

## 2024-09-08 MED ORDER — SODIUM CHLORIDE 0.9 % IV SOLN: INTRAVENOUS | Status: DC

## 2024-09-08 MED ORDER — DIPHENHYDRAMINE HCL 25 MG PO CAPS
25.0000 mg | ORAL_CAPSULE | Freq: Once | ORAL | Status: AC
  Administered 2024-09-08: 25 mg via ORAL
  Filled 2024-09-08: qty 1

## 2024-09-08 MED ORDER — ZOLEDRONIC ACID 5 MG/100ML IV SOLN
5.0000 mg | Freq: Once | INTRAVENOUS | Status: AC
  Administered 2024-09-08: 5 mg via INTRAVENOUS
  Filled 2024-09-08: qty 100

## 2024-09-08 MED ORDER — ACETAMINOPHEN 325 MG PO TABS
650.0000 mg | ORAL_TABLET | Freq: Once | ORAL | Status: AC
  Administered 2024-09-08: 650 mg via ORAL
  Filled 2024-09-08: qty 2

## 2024-09-08 NOTE — Patient Instructions (Signed)

## 2024-09-08 NOTE — Progress Notes (Signed)
 Diagnosis: Osteoporosis  Provider:  Lonna Coder MD  Procedure: IV Infusion  IV Type: Peripheral, IV Location: R Forearm  Reclast  (Zolendronic Acid), Dose: 5 mg  Infusion Start Time: 1016  Infusion Stop Time: 1048  Post Infusion IV Care: Observation period completed and Peripheral IV Discontinued  Discharge: Condition: Good, Destination: Home . AVS Declined  Performed by:  Chasitee Zenker, RN

## 2024-09-23 ENCOUNTER — Encounter: Payer: Self-pay | Admitting: Family Medicine

## 2024-09-23 NOTE — Telephone Encounter (Signed)
 Fyi to PCP and FMLA pool

## 2025-08-23 ENCOUNTER — Ambulatory Visit: Admitting: Internal Medicine
